# Patient Record
Sex: Male | Born: 1940 | Race: Black or African American | Hispanic: No | Marital: Married | Smoking: Former smoker
Health system: Southern US, Community
[De-identification: ages and names within clinical notes are randomized; demographics above are authoritative.]

## PROBLEM LIST (undated history)

## (undated) DIAGNOSIS — I10 Essential (primary) hypertension: Secondary | ICD-10-CM

## (undated) DIAGNOSIS — K5909 Other constipation: Secondary | ICD-10-CM

## (undated) DIAGNOSIS — E039 Hypothyroidism, unspecified: Secondary | ICD-10-CM

## (undated) DIAGNOSIS — IMO0001 Reserved for inherently not codable concepts without codable children: Secondary | ICD-10-CM

## (undated) DIAGNOSIS — R05 Cough: Secondary | ICD-10-CM

## (undated) DIAGNOSIS — Z5112 Encounter for antineoplastic immunotherapy: Secondary | ICD-10-CM

## (undated) DIAGNOSIS — N400 Enlarged prostate without lower urinary tract symptoms: Secondary | ICD-10-CM

## (undated) DIAGNOSIS — F411 Generalized anxiety disorder: Secondary | ICD-10-CM

## (undated) DIAGNOSIS — G4730 Sleep apnea, unspecified: Secondary | ICD-10-CM

## (undated) DIAGNOSIS — N529 Male erectile dysfunction, unspecified: Secondary | ICD-10-CM

## (undated) DIAGNOSIS — T451X5A Adverse effect of antineoplastic and immunosuppressive drugs, initial encounter: Secondary | ICD-10-CM

## (undated) DIAGNOSIS — K5730 Diverticulosis of large intestine without perforation or abscess without bleeding: Secondary | ICD-10-CM

## (undated) DIAGNOSIS — M25552 Pain in left hip: Secondary | ICD-10-CM

## (undated) DIAGNOSIS — R300 Dysuria: Secondary | ICD-10-CM

## (undated) DIAGNOSIS — J910 Malignant pleural effusion: Secondary | ICD-10-CM

## (undated) DIAGNOSIS — R0602 Shortness of breath: Secondary | ICD-10-CM

## (undated) DIAGNOSIS — H409 Unspecified glaucoma: Secondary | ICD-10-CM

## (undated) DIAGNOSIS — B351 Tinea unguium: Secondary | ICD-10-CM

## (undated) DIAGNOSIS — R5382 Chronic fatigue, unspecified: Secondary | ICD-10-CM

## (undated) DIAGNOSIS — IMO0002 Reserved for concepts with insufficient information to code with codable children: Secondary | ICD-10-CM

## (undated) DIAGNOSIS — G2581 Restless legs syndrome: Secondary | ICD-10-CM

## (undated) DIAGNOSIS — K589 Irritable bowel syndrome without diarrhea: Secondary | ICD-10-CM

## (undated) DIAGNOSIS — D6481 Anemia due to antineoplastic chemotherapy: Secondary | ICD-10-CM

## (undated) DIAGNOSIS — K219 Gastro-esophageal reflux disease without esophagitis: Secondary | ICD-10-CM

## (undated) DIAGNOSIS — E860 Dehydration: Secondary | ICD-10-CM

## (undated) DIAGNOSIS — Z5111 Encounter for antineoplastic chemotherapy: Secondary | ICD-10-CM

## (undated) DIAGNOSIS — E7800 Pure hypercholesterolemia, unspecified: Secondary | ICD-10-CM

## (undated) DIAGNOSIS — D126 Benign neoplasm of colon, unspecified: Secondary | ICD-10-CM

## (undated) DIAGNOSIS — C801 Malignant (primary) neoplasm, unspecified: Secondary | ICD-10-CM

## (undated) HISTORY — PX: COLONOSCOPY: SHX174

## (undated) HISTORY — DX: Gastro-esophageal reflux disease without esophagitis: K21.9

## (undated) HISTORY — PX: TONSILLECTOMY: SUR1361

## (undated) HISTORY — DX: Chronic fatigue, unspecified: R53.82

## (undated) HISTORY — DX: Diverticulosis of large intestine without perforation or abscess without bleeding: K57.30

## (undated) HISTORY — DX: Essential (primary) hypertension: I10

## (undated) HISTORY — DX: Restless legs syndrome: G25.81

## (undated) HISTORY — DX: Shortness of breath: R06.02

## (undated) HISTORY — PX: OTHER SURGICAL HISTORY: SHX169

## (undated) HISTORY — DX: Benign neoplasm of colon, unspecified: D12.6

## (undated) HISTORY — PX: CARDIAC CATHETERIZATION: SHX172

## (undated) HISTORY — DX: Reserved for inherently not codable concepts without codable children: IMO0001

## (undated) HISTORY — DX: Cough: R05

## (undated) HISTORY — DX: Other constipation: K59.09

## (undated) HISTORY — DX: Reserved for concepts with insufficient information to code with codable children: IMO0002

## (undated) HISTORY — DX: Adverse effect of antineoplastic and immunosuppressive drugs, initial encounter: T45.1X5A

## (undated) HISTORY — DX: Pure hypercholesterolemia, unspecified: E78.00

## (undated) HISTORY — DX: Irritable bowel syndrome, unspecified: K58.9

## (undated) HISTORY — DX: Generalized anxiety disorder: F41.1

## (undated) HISTORY — DX: Dysuria: R30.0

## (undated) HISTORY — PX: THYROIDECTOMY: SHX17

## (undated) HISTORY — DX: Tinea unguium: B35.1

## (undated) HISTORY — DX: Encounter for antineoplastic chemotherapy: Z51.11

## (undated) HISTORY — DX: Anemia due to antineoplastic chemotherapy: D64.81

## (undated) HISTORY — DX: Benign prostatic hyperplasia without lower urinary tract symptoms: N40.0

## (undated) HISTORY — DX: Male erectile dysfunction, unspecified: N52.9

## (undated) HISTORY — DX: Encounter for antineoplastic immunotherapy: Z51.12

## (undated) HISTORY — DX: Sleep apnea, unspecified: G47.30

## (undated) HISTORY — DX: Dehydration: E86.0

## (undated) HISTORY — DX: Pain in left hip: M25.552

---

## 1999-07-31 ENCOUNTER — Ambulatory Visit (HOSPITAL_COMMUNITY): Admission: RE | Admit: 1999-07-31 | Discharge: 1999-07-31 | Payer: Self-pay | Admitting: Gastroenterology

## 1999-07-31 ENCOUNTER — Encounter: Payer: Self-pay | Admitting: Gastroenterology

## 2000-10-31 ENCOUNTER — Encounter (INDEPENDENT_AMBULATORY_CARE_PROVIDER_SITE_OTHER): Payer: Self-pay | Admitting: Gastroenterology

## 2005-04-09 ENCOUNTER — Ambulatory Visit: Payer: Self-pay | Admitting: Pulmonary Disease

## 2005-05-17 ENCOUNTER — Ambulatory Visit: Payer: Self-pay | Admitting: Pulmonary Disease

## 2005-10-19 ENCOUNTER — Ambulatory Visit: Payer: Self-pay | Admitting: Gastroenterology

## 2005-10-30 ENCOUNTER — Ambulatory Visit: Payer: Self-pay | Admitting: Gastroenterology

## 2005-10-30 ENCOUNTER — Encounter (INDEPENDENT_AMBULATORY_CARE_PROVIDER_SITE_OTHER): Payer: Self-pay | Admitting: Specialist

## 2005-10-30 DIAGNOSIS — D126 Benign neoplasm of colon, unspecified: Secondary | ICD-10-CM | POA: Insufficient documentation

## 2006-06-20 ENCOUNTER — Ambulatory Visit: Payer: Self-pay | Admitting: Pulmonary Disease

## 2006-07-08 ENCOUNTER — Ambulatory Visit: Payer: Self-pay | Admitting: Pulmonary Disease

## 2006-07-17 ENCOUNTER — Ambulatory Visit (HOSPITAL_BASED_OUTPATIENT_CLINIC_OR_DEPARTMENT_OTHER): Admission: RE | Admit: 2006-07-17 | Discharge: 2006-07-17 | Payer: Self-pay | Admitting: Pulmonary Disease

## 2006-09-18 ENCOUNTER — Ambulatory Visit: Payer: Self-pay | Admitting: Pulmonary Disease

## 2006-09-18 LAB — CONVERTED CEMR LAB
ALT: 18 units/L (ref 0–40)
AST: 14 units/L (ref 0–37)
Albumin: 3.7 g/dL (ref 3.5–5.2)
Alkaline Phosphatase: 41 units/L (ref 39–117)
BUN: 14 mg/dL (ref 6–23)
Basophils Absolute: 0 10*3/uL (ref 0.0–0.1)
Basophils Relative: 0.5 % (ref 0.0–1.0)
Bilirubin, Direct: 0.1 mg/dL (ref 0.0–0.3)
CO2: 32 meq/L (ref 19–32)
Calcium: 9.3 mg/dL (ref 8.4–10.5)
Chloride: 108 meq/L (ref 96–112)
Cholesterol: 157 mg/dL (ref 0–200)
Creatinine, Ser: 1.1 mg/dL (ref 0.4–1.5)
Eosinophils Absolute: 0 10*3/uL (ref 0.0–0.6)
Eosinophils Relative: 1.5 % (ref 0.0–5.0)
GFR calc Af Amer: 86 mL/min
GFR calc non Af Amer: 71 mL/min
Glucose, Bld: 102 mg/dL — ABNORMAL HIGH (ref 70–99)
HCT: 43.2 % (ref 39.0–52.0)
HDL: 52.9 mg/dL (ref >39.0)
Hemoglobin: 14.7 g/dL (ref 13.0–17.0)
LDL Cholesterol: 94 mg/dL (ref 0–99)
Lymphocytes Relative: 42.2 % (ref 12.0–46.0)
MCHC: 34.2 g/dL (ref 30.0–36.0)
MCV: 86.6 fL (ref 78.0–100.0)
Monocytes Absolute: 0.3 10*3/uL (ref 0.2–0.7)
Monocytes Relative: 7.9 % (ref 3.0–11.0)
Neutro Abs: 1.6 10*3/uL (ref 1.4–7.7)
Neutrophils Relative %: 47.9 % (ref 43.0–77.0)
PSA: 1.33 ng/mL (ref 0.10–4.00)
Platelets: 280 10*3/uL (ref 150–400)
Potassium: 4.3 meq/L (ref 3.5–5.1)
RBC: 4.98 M/uL (ref 4.22–5.81)
RDW: 14.2 % (ref 11.5–14.6)
Sodium: 143 meq/L (ref 135–145)
TSH: 1.27 microintl units/mL (ref 0.35–5.50)
Total Bilirubin: 0.6 mg/dL (ref 0.3–1.2)
Total CHOL/HDL Ratio: 3
Total Protein: 6.6 g/dL (ref 6.0–8.3)
Triglycerides: 53 mg/dL (ref 0–149)
VLDL: 11 mg/dL (ref 0–40)
WBC: 3.3 10*3/uL — ABNORMAL LOW (ref 4.5–10.5)

## 2007-03-21 ENCOUNTER — Ambulatory Visit: Payer: Self-pay | Admitting: Pulmonary Disease

## 2007-04-11 ENCOUNTER — Ambulatory Visit: Payer: Self-pay | Admitting: Gastroenterology

## 2007-09-11 DIAGNOSIS — F528 Other sexual dysfunction not due to a substance or known physiological condition: Secondary | ICD-10-CM

## 2007-09-11 DIAGNOSIS — E7800 Pure hypercholesterolemia, unspecified: Secondary | ICD-10-CM

## 2007-09-11 DIAGNOSIS — I10 Essential (primary) hypertension: Secondary | ICD-10-CM

## 2007-09-11 DIAGNOSIS — K219 Gastro-esophageal reflux disease without esophagitis: Secondary | ICD-10-CM

## 2007-09-11 DIAGNOSIS — K589 Irritable bowel syndrome without diarrhea: Secondary | ICD-10-CM

## 2007-09-11 DIAGNOSIS — N138 Other obstructive and reflux uropathy: Secondary | ICD-10-CM | POA: Insufficient documentation

## 2007-09-11 DIAGNOSIS — N401 Enlarged prostate with lower urinary tract symptoms: Secondary | ICD-10-CM

## 2007-09-18 ENCOUNTER — Ambulatory Visit: Payer: Self-pay | Admitting: Pulmonary Disease

## 2007-09-21 DIAGNOSIS — G2581 Restless legs syndrome: Secondary | ICD-10-CM

## 2007-09-21 DIAGNOSIS — G4733 Obstructive sleep apnea (adult) (pediatric): Secondary | ICD-10-CM

## 2007-09-21 DIAGNOSIS — K5730 Diverticulosis of large intestine without perforation or abscess without bleeding: Secondary | ICD-10-CM

## 2007-09-21 LAB — CONVERTED CEMR LAB
ALT: 21 units/L (ref 0–53)
AST: 20 units/L (ref 0–37)
Albumin: 3.8 g/dL (ref 3.5–5.2)
Alkaline Phosphatase: 32 units/L — ABNORMAL LOW (ref 39–117)
BUN: 11 mg/dL (ref 6–23)
Basophils Absolute: 0 10*3/uL (ref 0.0–0.1)
Basophils Relative: 0.9 % (ref 0.0–1.0)
Bilirubin, Direct: 0.1 mg/dL (ref 0.0–0.3)
CO2: 29 meq/L (ref 19–32)
Calcium: 9.3 mg/dL (ref 8.4–10.5)
Chloride: 109 meq/L (ref 96–112)
Cholesterol: 124 mg/dL (ref 0–200)
Creatinine, Ser: 1.2 mg/dL (ref 0.4–1.5)
Eosinophils Absolute: 0.1 10*3/uL (ref 0.0–0.6)
Eosinophils Relative: 2.1 % (ref 0.0–5.0)
GFR calc Af Amer: 78 mL/min
GFR calc non Af Amer: 64 mL/min
Glucose, Bld: 114 mg/dL — ABNORMAL HIGH (ref 70–99)
HCT: 43.8 % (ref 39.0–52.0)
HDL: 43.8 mg/dL (ref >39.0)
Hemoglobin: 14 g/dL (ref 13.0–17.0)
LDL Cholesterol: 74 mg/dL (ref 0–99)
Lymphocytes Relative: 40.9 % (ref 12.0–46.0)
MCHC: 31.9 g/dL (ref 30.0–36.0)
MCV: 88.9 fL (ref 78.0–100.0)
Monocytes Absolute: 0.3 10*3/uL (ref 0.2–0.7)
Monocytes Relative: 9.6 % (ref 3.0–11.0)
Neutro Abs: 1.4 10*3/uL (ref 1.4–7.7)
Neutrophils Relative %: 46.5 % (ref 43.0–77.0)
PSA: 0.78 ng/mL (ref 0.10–4.00)
Platelets: 257 10*3/uL (ref 150–400)
Potassium: 4.3 meq/L (ref 3.5–5.1)
RBC: 4.92 M/uL (ref 4.22–5.81)
RDW: 13.5 % (ref 11.5–14.6)
Sodium: 142 meq/L (ref 135–145)
TSH: 1.16 microintl units/mL (ref 0.35–5.50)
Total Bilirubin: 0.6 mg/dL (ref 0.3–1.2)
Total CHOL/HDL Ratio: 2.8
Total Protein: 6.5 g/dL (ref 6.0–8.3)
Triglycerides: 33 mg/dL (ref 0–149)
VLDL: 7 mg/dL (ref 0–40)
WBC: 3 10*3/uL — ABNORMAL LOW (ref 4.5–10.5)

## 2008-04-26 ENCOUNTER — Telehealth (INDEPENDENT_AMBULATORY_CARE_PROVIDER_SITE_OTHER): Payer: Self-pay | Admitting: *Deleted

## 2008-06-29 ENCOUNTER — Encounter: Payer: Self-pay | Admitting: Pulmonary Disease

## 2008-10-15 ENCOUNTER — Ambulatory Visit: Payer: Self-pay | Admitting: Pulmonary Disease

## 2008-10-15 DIAGNOSIS — R413 Other amnesia: Secondary | ICD-10-CM

## 2008-10-15 DIAGNOSIS — B351 Tinea unguium: Secondary | ICD-10-CM | POA: Insufficient documentation

## 2008-10-15 DIAGNOSIS — F411 Generalized anxiety disorder: Secondary | ICD-10-CM | POA: Insufficient documentation

## 2008-10-19 ENCOUNTER — Ambulatory Visit: Payer: Self-pay | Admitting: Pulmonary Disease

## 2008-10-24 LAB — CONVERTED CEMR LAB
ALT: 23 units/L (ref 0–53)
AST: 24 units/L (ref 0–37)
Albumin: 3.7 g/dL (ref 3.5–5.2)
Alkaline Phosphatase: 42 units/L (ref 39–117)
BUN: 19 mg/dL (ref 6–23)
Basophils Absolute: 0 10*3/uL (ref 0.0–0.1)
Basophils Relative: 0.4 % (ref 0.0–3.0)
Bilirubin, Direct: 0.1 mg/dL (ref 0.0–0.3)
CO2: 27 meq/L (ref 19–32)
Calcium: 8.9 mg/dL (ref 8.4–10.5)
Chloride: 112 meq/L (ref 96–112)
Cholesterol: 123 mg/dL (ref 0–200)
Creatinine, Ser: 1.1 mg/dL (ref 0.4–1.5)
Eosinophils Absolute: 0 10*3/uL (ref 0.0–0.7)
Eosinophils Relative: 0.9 % (ref 0.0–5.0)
GFR calc non Af Amer: 85.56 mL/min (ref >60)
Glucose, Bld: 105 mg/dL — ABNORMAL HIGH (ref 70–99)
HCT: 41 % (ref 39.0–52.0)
HDL: 40.6 mg/dL (ref >39.00)
Hemoglobin: 13.8 g/dL (ref 13.0–17.0)
LDL Cholesterol: 75 mg/dL (ref 0–99)
Lymphocytes Relative: 42.5 % (ref 12.0–46.0)
Lymphs Abs: 1.6 10*3/uL (ref 0.7–4.0)
MCHC: 33.5 g/dL (ref 30.0–36.0)
MCV: 88.1 fL (ref 78.0–100.0)
Monocytes Absolute: 0.3 10*3/uL (ref 0.1–1.0)
Monocytes Relative: 8.4 % (ref 3.0–12.0)
Neutro Abs: 1.9 10*3/uL (ref 1.4–7.7)
Neutrophils Relative %: 47.8 % (ref 43.0–77.0)
Platelets: 233 10*3/uL (ref 150.0–400.0)
Potassium: 3.8 meq/L (ref 3.5–5.1)
RBC: 4.66 M/uL (ref 4.22–5.81)
RDW: 13.7 % (ref 11.5–14.6)
Sodium: 142 meq/L (ref 135–145)
TSH: 0.91 microintl units/mL (ref 0.35–5.50)
Total Bilirubin: 0.7 mg/dL (ref 0.3–1.2)
Total CHOL/HDL Ratio: 3
Total Protein: 6.4 g/dL (ref 6.0–8.3)
Triglycerides: 36 mg/dL (ref 0.0–149.0)
VLDL: 7.2 mg/dL (ref 0.0–40.0)
WBC: 3.8 10*3/uL — ABNORMAL LOW (ref 4.5–10.5)

## 2008-11-05 ENCOUNTER — Encounter (INDEPENDENT_AMBULATORY_CARE_PROVIDER_SITE_OTHER): Payer: Self-pay | Admitting: *Deleted

## 2008-11-10 ENCOUNTER — Telehealth (INDEPENDENT_AMBULATORY_CARE_PROVIDER_SITE_OTHER): Payer: Self-pay | Admitting: *Deleted

## 2008-11-16 ENCOUNTER — Telehealth: Payer: Self-pay | Admitting: Gastroenterology

## 2009-07-12 ENCOUNTER — Ambulatory Visit: Payer: Self-pay | Admitting: Gastroenterology

## 2009-07-12 DIAGNOSIS — K5909 Other constipation: Secondary | ICD-10-CM

## 2009-07-13 ENCOUNTER — Ambulatory Visit: Payer: Self-pay | Admitting: Gastroenterology

## 2009-07-18 ENCOUNTER — Encounter: Payer: Self-pay | Admitting: Gastroenterology

## 2009-07-21 ENCOUNTER — Ambulatory Visit: Payer: Self-pay | Admitting: Gastroenterology

## 2009-07-26 ENCOUNTER — Encounter: Payer: Self-pay | Admitting: Gastroenterology

## 2009-08-17 ENCOUNTER — Ambulatory Visit: Payer: Self-pay | Admitting: Gastroenterology

## 2009-08-22 ENCOUNTER — Telehealth: Payer: Self-pay | Admitting: Gastroenterology

## 2009-08-24 ENCOUNTER — Telehealth: Payer: Self-pay | Admitting: Gastroenterology

## 2009-12-05 ENCOUNTER — Encounter: Payer: Self-pay | Admitting: Pulmonary Disease

## 2010-01-09 ENCOUNTER — Ambulatory Visit: Payer: Self-pay | Admitting: Pulmonary Disease

## 2010-01-09 ENCOUNTER — Encounter: Payer: Self-pay | Admitting: Adult Health

## 2010-01-30 ENCOUNTER — Encounter: Payer: Self-pay | Admitting: Adult Health

## 2010-01-30 ENCOUNTER — Ambulatory Visit: Payer: Self-pay | Admitting: Pulmonary Disease

## 2010-02-06 ENCOUNTER — Telehealth: Payer: Self-pay | Admitting: Adult Health

## 2010-02-06 ENCOUNTER — Telehealth (INDEPENDENT_AMBULATORY_CARE_PROVIDER_SITE_OTHER): Payer: Self-pay | Admitting: *Deleted

## 2010-02-06 LAB — CONVERTED CEMR LAB
ALT: 26 units/L (ref 0–53)
AST: 20 units/L (ref 0–37)
Albumin: 4.1 g/dL (ref 3.5–5.2)
Alkaline Phosphatase: 39 units/L (ref 39–117)
BUN: 20 mg/dL (ref 6–23)
Basophils Absolute: 0 10*3/uL (ref 0.0–0.1)
Basophils Relative: 0.4 % (ref 0.0–3.0)
Bilirubin, Direct: 0.1 mg/dL (ref 0.0–0.3)
CO2: 29 meq/L (ref 19–32)
Calcium: 9.7 mg/dL (ref 8.4–10.5)
Chloride: 111 meq/L (ref 96–112)
Cholesterol: 159 mg/dL (ref 0–200)
Creatinine, Ser: 1.2 mg/dL (ref 0.4–1.5)
Eosinophils Absolute: 0 10*3/uL (ref 0.0–0.7)
Eosinophils Relative: 1.3 % (ref 0.0–5.0)
Folate: 18 ng/mL
GFR calc non Af Amer: 76.37 mL/min (ref >60)
Glucose, Bld: 110 mg/dL — ABNORMAL HIGH (ref 70–99)
HCT: 41.5 % (ref 39.0–52.0)
HDL: 51.4 mg/dL (ref >39.00)
Hemoglobin: 14.1 g/dL (ref 13.0–17.0)
LDL Cholesterol: 98 mg/dL (ref 0–99)
Lymphocytes Relative: 39.4 % (ref 12.0–46.0)
Lymphs Abs: 1.5 10*3/uL (ref 0.7–4.0)
MCHC: 33.9 g/dL (ref 30.0–36.0)
MCV: 88.8 fL (ref 78.0–100.0)
Monocytes Absolute: 0.3 10*3/uL (ref 0.1–1.0)
Monocytes Relative: 9.3 % (ref 3.0–12.0)
Neutro Abs: 1.9 10*3/uL (ref 1.4–7.7)
Neutrophils Relative %: 49.6 % (ref 43.0–77.0)
Platelets: 253 10*3/uL (ref 150.0–400.0)
Potassium: 4.5 meq/L (ref 3.5–5.1)
RBC: 4.68 M/uL (ref 4.22–5.81)
RDW: 15 % — ABNORMAL HIGH (ref 11.5–14.6)
RPR Ser Ql: REACTIVE — AB
RPR Titer: 1:2 {titer}
Sodium: 143 meq/L (ref 135–145)
T pallidum Antibodies (TP-PA): >8 — ABNORMAL HIGH (ref <0.90)
TSH: 0.65 microintl units/mL (ref 0.35–5.50)
Total Bilirubin: 0.5 mg/dL (ref 0.3–1.2)
Total CHOL/HDL Ratio: 3
Total Protein: 7 g/dL (ref 6.0–8.3)
Triglycerides: 46 mg/dL (ref 0.0–149.0)
VLDL: 9.2 mg/dL (ref 0.0–40.0)
Vit D, 25-Hydroxy: 33 ng/mL (ref 30–89)
Vitamin B-12: 701 pg/mL (ref 211–911)
WBC: 3.8 10*3/uL — ABNORMAL LOW (ref 4.5–10.5)

## 2010-02-07 ENCOUNTER — Encounter: Payer: Self-pay | Admitting: Adult Health

## 2010-02-07 LAB — CONVERTED CEMR LAB
RPR Ser Ql: REACTIVE — AB
RPR Titer: 1:1 {titer}
T pallidum Antibodies (TP-PA): >8 — ABNORMAL HIGH (ref <0.90)

## 2010-02-08 ENCOUNTER — Encounter: Payer: Self-pay | Admitting: Pulmonary Disease

## 2010-02-17 ENCOUNTER — Encounter: Payer: Self-pay | Admitting: Pulmonary Disease

## 2010-02-22 ENCOUNTER — Encounter: Payer: Self-pay | Admitting: Pulmonary Disease

## 2010-02-23 ENCOUNTER — Encounter: Payer: Self-pay | Admitting: Adult Health

## 2010-02-23 ENCOUNTER — Telehealth (INDEPENDENT_AMBULATORY_CARE_PROVIDER_SITE_OTHER): Payer: Self-pay | Admitting: *Deleted

## 2010-02-27 ENCOUNTER — Encounter: Payer: Self-pay | Admitting: Pulmonary Disease

## 2010-03-01 ENCOUNTER — Telehealth: Payer: Self-pay | Admitting: Adult Health

## 2010-03-10 ENCOUNTER — Telehealth (INDEPENDENT_AMBULATORY_CARE_PROVIDER_SITE_OTHER): Payer: Self-pay | Admitting: *Deleted

## 2010-03-13 ENCOUNTER — Ambulatory Visit: Payer: Self-pay | Admitting: Pulmonary Disease

## 2010-03-20 ENCOUNTER — Ambulatory Visit: Payer: Self-pay | Admitting: Pulmonary Disease

## 2010-03-27 ENCOUNTER — Ambulatory Visit: Payer: Self-pay | Admitting: Pulmonary Disease

## 2010-05-22 ENCOUNTER — Ambulatory Visit: Payer: Self-pay | Admitting: Pulmonary Disease

## 2010-05-22 ENCOUNTER — Encounter: Payer: Self-pay | Admitting: Adult Health

## 2010-05-22 ENCOUNTER — Telehealth (INDEPENDENT_AMBULATORY_CARE_PROVIDER_SITE_OTHER): Payer: Self-pay | Admitting: *Deleted

## 2010-05-29 DIAGNOSIS — R071 Chest pain on breathing: Secondary | ICD-10-CM

## 2010-07-12 ENCOUNTER — Ambulatory Visit: Admit: 2010-07-12 | Payer: Self-pay | Admitting: Pulmonary Disease

## 2010-07-13 ENCOUNTER — Telehealth: Payer: Self-pay | Admitting: Pulmonary Disease

## 2010-07-23 ENCOUNTER — Encounter: Payer: Self-pay | Admitting: Urology

## 2010-08-01 NOTE — Letter (Signed)
Summary: EGD Instructions  [facility]  [address] [place], [place] [address]   Phone: [phone]  Fax: [phone]       Alan [name]    [birth year]    MRN: [id number]       Procedure Day /Date:THURSDAY 07/21/2009     Arrival Time: 8:00AM     Procedure Time:9:00AM     Location of Procedure:                    X  [facility] (4th Floor)   PREPARATION FOR ENDOSCOPY/DIL   On 07/21/2009 THE DAY OF THE PROCEDURE:  1.   No solid foods, milk or milk products are allowed after midnight the night before your procedure.  2.   Do not drink anything colored red or purple.  Avoid juices with pulp.  No orange juice.  3.  You may drink clear liquids until 7:00AM  which is 2 hours before your procedure.                                                                                                CLEAR LIQUIDS INCLUDE: Water Jello Ice Popsicles Tea (sugar ok, no milk/cream) Powdered fruit flavored drinks Coffee (sugar ok, no milk/cream) Gatorade Juice: apple, white grape, white cranberry  Lemonade Clear bullion, consomm, broth Carbonated beverages (any kind) Strained chicken noodle soup Hard Candy   MEDICATION INSTRUCTIONS  Unless otherwise instructed, you should take regular prescription medications with a small sip of water as early as possible the morning of your procedure.            OTHER INSTRUCTIONS  You will need a responsible adult at least 70 years of age to accompany you and drive you home.   This person must remain in the waiting room during your procedure.  Wear loose fitting clothing that is easily removed.  Leave jewelry and other valuables at home.  However, you may wish to bring a book to read or an iPod/MP3 player to listen to music as you wait for your procedure to start.  Remove all body piercing jewelry and leave at home.  Total time from sign-in until discharge is approximately 2-3 hours.  You should go home  directly after your procedure and rest.  You can resume normal activities the day after your procedure.  The day of your procedure you should not:   Drive   Make legal decisions   Operate machinery   Drink alcohol   Return to work  You will receive specific instructions about eating, activities and medications before you leave.    The above instructions have been reviewed and explained to me by   _______________________    I fully understand and can verbalize these instructions _____________________________ Date _________

## 2010-08-01 NOTE — Assessment & Plan Note (Signed)
Summary: 20DAYS F-UP/YF    History of Present Illness Visit Type: Follow-up Visit Primary GI MD: Melvia Heaps MD [facility] Primary Provider: Alroy Dust, MD  Requesting Provider: n/a Chief Complaint: Pt states he is doing wonderful on the Prilosec, No GI complaints History of Present Illness:   [name] has returned following upper endoscopy and colonoscopy.  The former  demonstrated erosions at the GE junction and duodenitis.  At colonoscopy multiple adenomatous polyps were removed.  On Prilosec and MiraLax every 4 days he is feeling quite well.  Reflux symptoms are entirely gone.  He no longer has constipation or lower abdominal discomfort.   GI Review of Systems      Denies abdominal pain, acid reflux, belching, bloating, chest pain, dysphagia with liquids, dysphagia with solids, heartburn, loss of appetite, nausea, vomiting, vomiting blood, weight loss, and  weight gain.        Denies anal fissure, black tarry stools, change in bowel habit, constipation, diarrhea, diverticulosis, fecal incontinence, heme positive stool, hemorrhoids, irritable bowel syndrome, jaundice, light color stool, liver problems, rectal bleeding, and  rectal pain.    Current Medications (verified): 1)  Amlodipine Besylate 5 Mg  Tabs (Amlodipine Besylate) .... Take 1 Tablet By Mouth Once A Day 2)  Simvastatin 20 Mg  Tabs (Simvastatin) .... Take 1 Tablet By Mouth At Bedtime.Marland KitchenMarland Kitchen 3)  Flomax 0.4 Mg Xr24h-Cap (Tamsulosin Hcl) .... Take 1 Tab By Mouth At Bedtime.Marland KitchenMarland Kitchen 4)  Viagra 100 Mg Tabs (Sildenafil Citrate) .... Use As Directed 5)  Requip 1 Mg Tabs (Ropinirole Hcl) .... One Tablet By Mouth Once Daily 6)  Prilosec 20 Mg Cpdr (Omeprazole) .... Take One Tab Daily  Allergies (verified): 1)  ! Codeine  Past History:  Past Medical History: Last updated: 10/15/2008 OBSTRUCTIVE SLEEP APNEA (ICD-327.23) HYPERTENSION (ICD-401.9) HYPERCHOLESTEROLEMIA (ICD-272.0) GERD (ICD-530.81) DIVERTICULOSIS OF COLON  (ICD-562.10) IRRITABLE BOWEL SYNDROME (ICD-564.1) COLONIC POLYPS (ICD-211.3) BENIGN PROSTATIC HYPERTROPHY, HX OF (ICD-V13.8) ERECTILE DYSFUNCTION (ICD-302.72) RESTLESS LEG SYNDROME (ICD-333.94) ANXIETY (ICD-300.00) Hx of ONYCHOMYCOSIS (ICD-110.1)  Past Surgical History: Last updated: 07/12/2009 Unremarkable  Family History: Last updated: 07/12/2009 Family History of Colon Cancer: PGF  Social History: Last updated: 07/12/2009 Occupation: Retired Patient is a former smoker: quit 30 yrs ago  Alcohol Use - no Illicit Drug Use - no  Review of Systems  The patient denies allergy/sinus, anemia, anxiety-new, arthritis/joint pain, back pain, blood in urine, breast changes/lumps, change in vision, confusion, cough, coughing up blood, depression-new, fainting, fatigue, fever, headaches-new, hearing problems, heart murmur, heart rhythm changes, itching, muscle pains/cramps, night sweats, nosebleeds, shortness of breath, skin rash, sleeping problems, sore throat, swelling of feet/legs, swollen lymph glands, thirst - excessive, urination - excessive, urination changes/pain, urine leakage, vision changes, and voice change.    Vital Signs:  Patient profile:   70 year old male Height:      66.5 inches Weight:      170 pounds BMI:     27.13 BSA:     1.88 Pulse rate:   80 / minute Pulse rhythm:   regular BP sitting:   122 / 84  (left arm)  Vitals Entered By: Merri Ray CMA Duncan Dull) (August 17, 2009 9:57 AM)   Impression & Recommendations:  Problem # 1:  GERD (ICD-530.81) Assessment Improved Plan to continue Prilosec as needed  Problem # 2:  COLONIC POLYPS (ICD-211.3) followup colonoscopy in 3 years  Problem # 3:  OTHER CONSTIPATION (ICD-564.09) Assessment: Improved

## 2010-08-01 NOTE — Assessment & Plan Note (Signed)
Summary: inj of pcn--please call Shanda Bumps jones/pt here at 9/la   Nurse Visit   Allergies: 1)  ! 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

## 2010-08-01 NOTE — Progress Notes (Signed)
Summary: returning call  Phone Note Call from Patient Call back at Home Phone [phone]   Caller: Patient Call For: Alan [name] p Reason for Call: Talk to Nurse Summary of Call: pt returning call top [name] P Initial call taken by: Eugene Gavia,  February 06, 2010 8:14 AM  Follow-up for Phone Call        RPR is still reactive, he denies known hx of exposure or prev. tx.  Titer is low, suspect this is latent w/ no known tx in past rec:  benzathine penicillin G 2.4 million units intramuscularly once weekly for 3 weeks  we will send rx to pharm and get you to bring back pt is aware and will get med and bring to our office for shot.       Follow-up by: Timmi Devora NP,  February 06, 2010 9:50 AM    New/Updated Medications: BICILLIN L-A 2400000 UNIT/4ML SUSP (PENICILLIN G BENZATHINE) give IM shot weekly x 3 at our office Prescriptions: BICILLIN L-A 2400000 UNIT/4ML SUSP (PENICILLIN G BENZATHINE) give IM shot weekly x 3 at our office  #3 x 0   Entered and Authorized by:   Rubye Oaks NP   Signed by:   Rubye Oaks NP on 02/06/2010   Method used:   Electronically to        [facility] (retail)       [address]       [place], [place]  [address]       Ph: [phone]       Fax: [phone]   RxID:   [id number]   Appended Document: returning call injection visits scheduled - see other 8.8.11 phone message for details.

## 2010-08-01 NOTE — Assessment & Plan Note (Signed)
Summary: penicillin injection - no charge   Nurse Visit    Allergies: 1)  ! Codeine  Orders Added: 1)  No Charge Patient Arrived (NCPA0) [NCPA0]   patient was arrived for nurse visit to give PCN injections; it was discussed between patient and TP that the injections are not to be given here in the office d/t patient following up with neurology per append to 01-30-10 labs.  will document as "no charge." Boone Master CNA/MA  February 24, 2010 11:12 AM

## 2010-08-01 NOTE — Assessment & Plan Note (Signed)
Summary: pcn injection///JJ   Nurse Visit   Allergies: 1)  ! 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

## 2010-08-01 NOTE — Progress Notes (Signed)
Summary: requests recommendations  Phone Note Call from Patient   Caller: Patient Call For: nadel Summary of Call: pt states he has been waiting to hear from someone re: what the next step is (since receiving results from neurologist). he thought he should have heard from someone last week. pt # [id number] Initial call taken by: Tivis Ringer, CNA,  March 10, 2010 11:06 AM  Follow-up for Phone Call        Spoke with pt. He is upset and states "seems like I fell through the cracks with this situation"- he states that he was seen by neuro and that they did LP on 02/17/10 and faxed over results to SN same day.  He states that he was told that SN should followup on this and give further recs and advise him what the next step is.  Pls advise thanks Follow-up by: Vernie Murders,  March 10, 2010 11:32 AM  Additional Follow-up for Phone Call Additional follow up Details #1::        called and spoke with pt---per SN/Tp  he will need pcn injections once a week x 3 wks.  pt stated that he will be here on monday at 9 for this injection and he is aware that he will need to come in the next 2 mondays for the other 2 injections.  pt voiced his understanding of this.   Randell Loop CMA  March 10, 2010 3:30 PM

## 2010-08-01 NOTE — Assessment & Plan Note (Signed)
Summary: NP follow up - med refills   Copy to:  n/a Primary Provider/Referring Provider:  Alroy Dust, MD   CC:  need refills, pt is fasting, and no complaints.  History of Present Illness: 70 y/o BM with known hx of HTN, Hyperlipidemia and GERD.    4/10-- . he has mult med problems as noted below... he feels as though he has been doing well except that he worries about his memory- mostly forgetting names etc... we discussed "benign forgetfullness" vs early stage alzheimers and recommended mental exercises w/ scrabble, puzzles, etc...   January 09, 2010-Presents for follow up and med refills. Since last visit 1 year ago, doing well w/ no complaints today. Had colonoscopy and endo in 07/2009 w/ several polyps removed w/ neg path report, follow up colon in 3 years. Has BPH f/by Dr. Marcello Fennel ? need for surgery is planning for second opinion at [facility] this week. He does not execise on regular basis. Cholesterol last year w/ LDL <100 on simvastatin 20mg  . Last visit, in 09/2008 w/ some ? memory impaiment. w/ frequent forgetfullness. THis is continuing, today he continues to have difficulty w/ recall w/ 2/3 objects and missed 1 letter on spelling "DLROW". He has a strong family hx w/ both maternal grandmothers that had dementia. (passed at 75 and 7). Denies chest pain, dyspnea, orthopnea, hemoptysis, fever, n/v/d, edema, headache,recent travel or antibiotics.         Current Medications (verified): 1)  Amlodipine Besylate 5 Mg  Tabs (Amlodipine Besylate) .... Take 1 Tablet By Mouth Once A Day 2)  Simvastatin 20 Mg  Tabs (Simvastatin) .... Take 1 Tablet By Mouth At Bedtime.Marland KitchenMarland Kitchen 3)  Flomax 0.4 Mg Xr24h-Cap (Tamsulosin Hcl) .... Take 1 Tab By Mouth At Bedtime.Marland KitchenMarland Kitchen 4)  Viagra 100 Mg Tabs (Sildenafil Citrate) .... Use As Directed 5)  Requip 1 Mg Tabs (Ropinirole Hcl) .... One Tablet By Mouth Once Daily 6)  Prilosec 20 Mg Cpdr (Omeprazole) .... Take One Tab Daily 7)  Finasteride 5 Mg Tabs (Finasteride)  .[name].. 1 By Mouth Once Daily  Allergies: 1)  ! 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.1)  Past Surgical History: Last updated: 07/12/2009 Unremarkable  Review of Systems      See HPI  Vital Signs:  Patient profile:   70 year old male Height:      66.5 inches Weight:      179 pounds BMI:     28.56 O2 Sat:      98 % on Room air Pulse rate:   83 / minute BP sitting:   130 / 78  (right arm) Cuff size:   regular  Vitals Entered By: Kandice Hams CMA (January 09, 2010 10:00 AM)  O2 Flow:  Room air CC: need refills, pt is fasting, no complaints   Physical Exam  Additional Exam:   skin: anicteric HEENT: normocephalic; PEERLA; no nasal or pharyngeal abnormalities neck: supple nodes: no cervical lymphadenopathy chest: clear to ausculatation and percussion heart: no murmurs, gallops, or rubs abd: soft, nontender; BS normoactive; no abdominal masses, tenderness, organomegaly rectal: deferred ext: no cynanosis, clubbing, edema skeletal: no deformities neuro: oriented x 3; no focal abnormalities,  quick mental exam--recall 2/3 objects, "DLOW",-missed one.  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 w/ strong family hx  will check labs w/ tsh, b12/folate, and RPR will refer to neuro for eval.   Orders: TLB-B12 + Folate Pnl [phone]) T-RPR (Syphilis) [phone]) T-Vitamin D (25-Hydroxy) (32440-10272) Neurology Referral (Neuro) Est. Patient Level IV (53664)  Problem # 2:   HYPERTENSION (ICD-401.9) controlled on rx  His updated medication list for this problem includes:    Amlodipine Besylate 5 Mg Tabs (Amlodipine besylate) .[name]... Take 1 tablet by mouth once a day  Orders: TLB-BMP (Basic Metabolic Panel-BMET) (80048-METABOL) TLB-CBC Platelet - w/Differential (85025-CBCD) T-Vitamin D (25-Hydroxy) (40347-42595) Est. Patient Level IV (63875)  BP today: 130/78 Prior BP: 122/84 (08/17/2009)  Labs Reviewed: K+: 3.8 (10/19/2008) Creat: : 1.1 (10/19/2008)   Chol: 123 (10/19/2008)   HDL: 40.60 (10/19/2008)   LDL: 75 (10/19/2008)   TG: 36.0 (10/19/2008)  Problem # 3:  GERD (ICD-530.81) controlled.  His updated medication list for this problem includes:    Prilosec 20 Mg Cpdr (Omeprazole) .[name]... Take one tab daily  Orders: TLB-CBC Platelet - w/Differential (85025-CBCD) Est. Patient Level IV (64332)  Problem # 4:  HYPERCHOLESTEROLEMIA (ICD-272.0) at goal.  His updated medication list for this problem includes:    Simvastatin 20 Mg Tabs (Simvastatin) .[name]... Take 1 tablet by mouth at bedtime...  Orders: TLB-Hepatic/Liver Function Pnl (80076-HEPATIC) TLB-TSH (Thyroid Stimulating Hormone) (84443-TSH) TLB-Lipid Panel (80061-LIPID) Est. Patient Level IV (95188)  Labs Reviewed: SGOT: 24 (10/19/2008)   SGPT: 23 (10/19/2008)   HDL:40.60 (10/19/2008), 43.8 (09/18/2007)  LDL:75 (10/19/2008), 74 (09/18/2007)  Chol:123 (10/19/2008), 124 (09/18/2007)  Trig:36.0 (10/19/2008), 33 (09/18/2007)  Medications Added to Medication List This Visit: 1)  Finasteride 5 Mg Tabs (Finasteride) .[name].. 1 by mouth once daily  Complete Medication List: 1)  Amlodipine Besylate 5 Mg Tabs (Amlodipine besylate) .... Take 1 tablet by mouth once a day 2)  Simvastatin 20 Mg Tabs (Simvastatin) .... Take 1 tablet by mouth at bedtime.Marland KitchenMarland Kitchen 3)  Prilosec 20 Mg Cpdr (Omeprazole) .... Take one tab daily 4)  Finasteride 5 Mg Tabs (Finasteride) .[name].. 1 by mouth once daily 5)  Viagra 100 Mg Tabs  (Sildenafil citrate) .... Use as directed  Other Orders: Pneumococcal Vaccine (41660) Admin 1st Vaccine (63016) Admin 1st Vaccine [facility]) [phone])  Patient Instructions: 1)  Pneumovax today.  2)  Continue reading the paper, keeping list, puzzles are helpful.  3)  Low fat cholestrol diet, add light exercise such as walking , advance as tolerated.  4)  We are referring you  to neuro to evaluate your memory.  5)  I will call with labs.  6)  follow up Dr. Kriste Basque in 6 months for physical.  7)  Please contact office for sooner follow up if symptoms do not improve or worsen  Prescriptions: VIAGRA 100 MG TABS (SILDENAFIL CITRATE) use as directed  #10 x 6   Entered by:   Boone Master CNA/MA   Authorized by:   Rubye Oaks NP   Signed by:   Boone Master CNA/MA on 01/09/2010   Method used:   Electronically to        [facility] (retail)       [address]       [place], [place]  [address]       Ph: [phone]       Fax: [phone]   RxID:   [id number] PRILOSEC 20 MG  CPDR (OMEPRAZOLE) take one tab daily  #30 Each x 6   Entered by:   Boone Master CNA/MA   Authorized by:   Rubye Oaks NP   Signed by:   Boone Master CNA/MA on 01/09/2010   Method used:   Electronically to        [facility] (retail)       [address]       [place], [place]  [address]       Ph: [phone]       Fax: [phone]   RxID:   [id number] SIMVASTATIN 20 MG  TABS (SIMVASTATIN) Take 1 tablet by mouth at bedtime...  #30 x 6   Entered by:   Boone Master CNA/MA   Authorized by:   Rubye Oaks NP   Signed by:   Boone Master CNA/MA on 01/09/2010   Method used:   Electronically to        [facility] (retail)       [address]       [place], [place]  [address]       Ph: [phone]       Fax: [phone]   RxID:   [id number] AMLODIPINE BESYLATE 5 MG  TABS (AMLODIPINE BESYLATE) Take 1 tablet  by mouth once a day  #90 Each x 6   Entered by:   Boone Master CNA/MA   Authorized by:   Rubye Oaks NP   Signed by:   Boone Master CNA/MA on 01/09/2010   Method used:   Electronically to        [facility] (retail)       [address]       [place], [place]  [address]       Ph: [phone]       Fax: [phone]   RxID:   [id number]    Pneumovax Vaccine    Vaccine Type: Pneumovax    Site: right deltoid    Mfr: [employer]    Dose: 0.5 ml    Route: IM    Given by: Boone Master CNA/MA    Exp. Date: 04/26/2011    Lot #: [id number]    VIS given: 01/28/96 version given January 09, 2010.     Appended Document: NP follow up - med refills faxed via biscom to Dr. Terrace Arabia for upcoming 8.19.11 ov per TP's request.

## 2010-08-01 NOTE — Letter (Signed)
Summary: [facility]   Imported By: Lester Stephen 12/28/2009 08:19:05  _____________________________________________________________________  External Attachment:    Type:   Image     Comment:   External Document

## 2010-08-01 NOTE — Progress Notes (Signed)
Summary: question about PCN injection  Phone Note Call from Patient Call back at Home Phone [phone]   Caller: Patient Call For: parrett Summary of Call: Returning tammy's call. Initial call taken by: Darletta Moll,  February 06, 2010 3:03 PM  Follow-up for Phone Call        Tammy, did you call this pt again? Pls advise thanks Vernie Murders  February 06, 2010 3:07 PM  called spoke with patient.  appt made on xolair schedule for 8.10.11, 8.17.11 and 8.24.11 for penicillin injections.  pt is aware of these times and will ask for me to give the injections.  also, called spoke with pharmacy who stated that the pcn will be provided in single-dose vials.  pt to bring 1 dose with him to each visit.  pt verbalized his understanding. Follow-up by: Boone Master CNA/MA,  February 06, 2010 4:33 PM     Appended Document: question about PCN injection faxed via biscom to Dr. Terrace Arabia for upcoming 8.19.11 ov per TP's request.

## 2010-08-01 NOTE — Progress Notes (Signed)
Summary: returning call - LMTCB x 2-PT RETURNED CALL  Phone Note Call from Patient   Caller: Patient Call For: tammy parrett Reason for Call: Talk to Nurse Summary of Call: Returning Jessica's call.  Please call back at [phone]. Initial call taken by: Lehman Prom,  February 23, 2010 8:39 AM  Follow-up for Phone Call        jess did you call this pt?  Aundra Millet Reynolds LPN  February 23, 2010 9:21 AM   no, i've not spoken to this patient recently.  i'm not sure why he is calling. Boone Master CNA/MA  February 23, 2010 9:50 AM   Additional Follow-up for Phone Call Additional follow up Details #1::        [phone] is a fax number. LMOMTCB on [phone]. Checked with Virgel Bouquet, and Katheren Shams neither are trying to contact pt. Pt is receiving PCN inj from [facility]. Pt was scheduled for inj on August 24, "phone tree" may have called to inform him of same. Zackery Barefoot CMA  February 23, 2010 10:13 AM     Additional Follow-up for Phone Call Additional follow up Details #2::    lmomtcb [address] [facility]  February 23, 2010 3:05 PM   ATC pt's home number - [facility] Gweneth Dimitri RN  February 24, 2010 4:56 PM  Pt returned call from triage. call home # per pt. Tivis Ringer, CNA  February 27, 2010 8:58 AM    Additional Follow-up for Phone Call Additional follow up Details #3:: Details for Additional Follow-up Action Taken: Spoke with pt and advised that no one from the office has needed to contact him or has contacted him.  Adivsed that this could have been the phone tree calling him about inj appt.  Pt verbalized understanding. Additional Follow-up by: Vernie Murders,  February 27, 2010 9:06 AM

## 2010-08-01 NOTE — Consult Note (Signed)
Summary: [facility]  [facility]   Imported By: Sherian Rein 03/01/2010 14:32:23  _____________________________________________________________________  External Attachment:    Type:   Image     Comment:   External Document

## 2010-08-01 NOTE — Assessment & Plan Note (Signed)
Summary: Acute NP office visit - cough, CP   Copy to:  n/a Primary Provider/Referring Provider:  Alroy Dust, MD   CC:  dry cough x2weeks, denies wheezing, SOB, mucus production - was levaquin at UC, finished yesterday.  sharp CP left side, occ radiation into the left shoulder/arm x5days - denies vision changes, and difficulty swallowing.  History of Present Illness: 70 y/o BM with known hx of HTN, Hyperlipidemia and GERD.    4/10-- . he has mult med problems as noted below... he feels as though he has been doing well except that he worries about his memory- mostly forgetting names etc... we discussed "benign forgetfullness" vs early stage alzheimers and recommended mental exercises w/ scrabble, puzzles, etc...   January 09, 2010-Presents for follow up and med refills. Since last visit 1 year ago, doing well w/ no complaints today. Had colonoscopy and endo in 07/2009 w/ several polyps removed w/ neg path report, follow up colon in 3 years. Has BPH f/by Dr. Marcello Fennel ? need for surgery is planning for second opinion at [facility] this week. He does not execise on regular basis. Cholesterol last year w/ LDL <100 on simvastatin 20mg  . Last visit, in 09/2008 w/ some ? memory impaiment. w/ frequent forgetfullness. THis is continuing, today he continues to have difficulty w/ recall w/ 2/3 objects and missed 1 letter on spelling "DLROW". He has a strong family hx w/ both maternal grandmothers that had dementia. (passed at 85 and 18). Denies chest pain, dyspnea, orthopnea, hemoptysis, fever, n/v/d, edema, headache,recent travel or antibiotics.     May 22, 2010--Presents for an acute office visit. Complains of dry cough x2weeks. Was seen at Urgent care, dx w/ bronchtis and tx w/  levaquin which he finished yesterday. Complains that he has occasional  sharp CP left side, occ radiation into the left shoulder/arm x5days - denies vision changes, difficulty swallowing, extremity weakness, neck pain, speech  changes. Cough and congestion are better but has some pain on inspiration. Says he had xray with no Pneumonia. No records are available. Feels like he pulled a muscle after lifting heavy equipment 2 weeks ago.  Denies chest pain,  orthopnea, hemoptysis, fever, n/v/d, edema, headache.  Medications Prior to Update: 1)  Amlodipine Besylate 5 Mg  Tabs (Amlodipine Besylate) .... Take 1 Tablet By Mouth Once A Day 2)  Simvastatin 20 Mg  Tabs (Simvastatin) .... Take 1 Tablet By Mouth At Bedtime.Marland KitchenMarland Kitchen 3)  Prilosec 20 Mg Cpdr (Omeprazole) .... Take One Tab Daily 4)  Finasteride 5 Mg Tabs (Finasteride) .[name].. 1 By Mouth Once Daily 5)  Viagra 100 Mg Tabs (Sildenafil Citrate) .... Use As Directed 6)  Bicillin L-A 2400000 Unit/74ml Susp (Penicillin G Benzathine) .... Give Im Shot Weekly X 3 At [facility]  Current Medications (verified): 1)  Amlodipine Besylate 5 Mg  Tabs (Amlodipine Besylate) .... Take 1 Tablet By Mouth Once A Day 2)  Simvastatin 20 Mg  Tabs (Simvastatin) .... Take 1 Tablet By Mouth At Bedtime.Marland KitchenMarland Kitchen 3)  Prilosec 20 Mg Cpdr (Omeprazole) .... Take One Tab Daily 4)  Finasteride 5 Mg Tabs (Finasteride) .[name].. 1 By Mouth Once Daily 5)  Viagra 100 Mg Tabs (Sildenafil Citrate) .... Use As Directed  Allergies (verified): 1)  ! 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)  Family History: Last updated: 07/12/2009 Family History of Colon Cancer: PGF  Social History: Last updated: 07/12/2009 Occupation: Retired Patient is a former smoker: quit 30 yrs ago  Alcohol Use - no Illicit Drug  Use - no  Risk Factors: Smoking Status: quit (07/12/2009)  Review of Systems      See HPI  Vital Signs:  Patient profile:   70 year old male Height:      66.5 inches Weight:      175.25 pounds BMI:     27.96 O2 Sat:      99 % on Room air Temp:     97.7 degrees F oral Pulse rate:   81 / minute BP sitting:   124 / 84  (left arm) Cuff size:   regular  Vitals Entered By: Boone Master CNA/MA (May 22, 2010 4:14 PM)  O2 Flow:  Room air CC: dry cough x2weeks, denies wheezing, SOB, mucus production - was levaquin at UC, finished yesterday.  sharp CP left side, occ radiation into the left shoulder/arm x5days - denies vision changes, difficulty swallowing Is Patient Diabetic? No Comments Medications reviewed with patient Daytime contact number verified with patient. Boone Master CNA/MA  May 22, 2010 4:12 PM    Physical Exam  Additional Exam:  GEN: A/Ox3; pleasant , NAD HEENT:  [place]/AT, , EACs-clear, TMs-wnl, NOSE-clear, THROAT-clear NECK:  Supple w/ fair ROM; no JVD; normal carotid impulses w/o bruits; no thyromegaly or nodules palpated; no lymphadenopathy. RESP  Clear to P & A; w/o, wheezes/ rales/ or rhonchi CARD:  RRR, no m/r/g   GI:   Soft & nt; nml bowel sounds; no organomegaly or masses detected. Musco: Warm bil,  no calf tenderness edema, clubbing, pulses intact tender along upper chest wall, left cervical neck, nml rom of shoulder and neck w/ no radicular symptoms, nml grips bilaterally,  no rash noted.  Neuro: EOM-wnl, PERRLA, CN 2-12 intact,nml equal grips/streng    Impression & Recommendations:  Problem # 1:  CHEST WALL PAIN, ANTERIOR (ICD-786.52)  Suspect multifactoral w/ recent bronchitis, muscle strain from lifting and possible reflux . EKG w/ no acute changes  Plan:   Increase Prilosec 20mg  two times a day for 2 weeks then once daily  Warm heat to area three times a day as needed  Advil 200mg  3 tabs two times a day for 5 days  Flexeril 5mg  two  times a day as needed muscle spasm  Please contact office for sooner follow up if symptoms do not improve or worsen   Orders: Est. Patient Level IV ([address])  Medications Added to Medication List This Visit: 1)  Flexeril 5 Mg Tabs (Cyclobenzaprine hcl) .[name].. 1 by mouth two times a day as needed muscle spasm  Complete Medication List: 1)  Amlodipine Besylate 5 Mg Tabs (Amlodipine besylate) .... Take 1 tablet by mouth once a day 2)  Simvastatin 20 Mg Tabs (Simvastatin) .... Take 1 tablet by mouth at bedtime.Marland KitchenMarland Kitchen 3)  Prilosec 20 Mg Cpdr (Omeprazole) .... Take one tab daily 4)  Finasteride 5 Mg Tabs (Finasteride) .[name].. 1 by mouth once daily 5)  Viagra 100 Mg Tabs (Sildenafil citrate) .... Use as directed 6)  Flexeril 5 Mg Tabs (Cyclobenzaprine hcl) .[name].. 1 by mouth two times a day as needed muscle spasm  Patient Instructions: 1)  Increase Prilosec 20mg  two times a day for 2 weeks then once daily  2)  Warm heat to area three times a day as  needed  3)  Advil 200mg  3 tabs two times a day for 5 days  4)  Flexeril 5mg  two times a day as needed muscle spasm  5)  Please contact office for sooner follow up if symptoms do not improve or worsen  Prescriptions: SIMVASTATIN 20 MG  TABS (SIMVASTATIN) Take 1 tablet by mouth at bedtime...  #90 x 3   Entered and Authorized by:   Rubye Oaks NP   Signed by:   Rubye Oaks NP on 05/22/2010   Method used:   Electronically to        [facility] (retail)       [address]       [place], [place]  [address]       Ph: [phone]       Fax: [phone]   RxID:   [id number] FLEXERIL 5 MG TABS (CYCLOBENZAPRINE HCL) 1 by mouth two times a day as needed muscle spasm  #30 x 0   Entered and Authorized by:   Rubye Oaks NP   Signed by:   Rubye Oaks NP on 05/22/2010   Method used:   Electronically to        [facility] (retail)       [address]       [place], [place]  [address]        Ph: [phone]       Fax: [phone]   RxID:   [id number]

## 2010-08-01 NOTE — Letter (Signed)
Summary: [facility] Instructions  [facility]  [address] [place], [place] [address]   Phone: [phone]  Fax: [phone]       Alan [name]    [birth year]    MRN: [id number]        Procedure Day /Date:07/13/2009  WED     Arrival Time:8:00AM     Procedure Time:9:00AM     Location of Procedure:                    X  [facility] (4th Floor)   PREPARATION FOR COLONOSCOPY WITH MOVIPREP   Starting 5 days prior to your procedure TODAY do not eat nuts, seeds, popcorn, corn, beans, peas,  salads, or any raw vegetables.  Do not take any fiber supplements (e.g. Metamucil, Citrucel, and Benefiber).  THE DAY BEFORE YOUR PROCEDURE         DATE: 07/12/2009 DAY: TUES  1.  Drink clear liquids the entire day-NO SOLID FOOD  2.  Do not drink anything colored red or purple.  Avoid juices with pulp.  No orange juice.  3.  Drink at least 64 oz. (8 glasses) of fluid/clear liquids during the day to prevent dehydration and help the prep work efficiently.  CLEAR LIQUIDS INCLUDE: Water Jello Ice Popsicles Tea (sugar ok, no milk/cream) Powdered fruit flavored drinks Coffee (sugar ok, no milk/cream) Gatorade Juice: apple, white grape, white cranberry  Lemonade Clear bullion, consomm, broth Carbonated beverages (any kind) Strained chicken noodle soup Hard Candy                             4.  In the morning, mix first dose of MoviPrep solution:    Empty 1 Pouch A and 1 Pouch B into the disposable container    Add lukewarm drinking water to the top line of the container. Mix to dissolve    Refrigerate (mixed solution should be used within 24 hrs)  5.  Begin drinking the prep at 5:00 p.m. The MoviPrep container is divided by 4 marks.   Every 15 minutes drink the solution down to the next mark (approximately 8 oz) until the full liter is complete.   6.  Follow completed prep with 16 oz of clear liquid of your choice (Nothing red or purple).  Continue to drink  clear liquids until bedtime.  7.  Before going to bed, mix second dose of MoviPrep solution:    Empty 1 Pouch A and 1 Pouch B into the disposable container    Add lukewarm drinking water to the top line of the container. Mix to dissolve    Refrigerate  THE DAY OF YOUR PROCEDURE      DATE: 07/13/2009 DAY: WED  Beginning at 4:00a.m. (5 hours before procedure):         1. Every 15 minutes, drink the solution down to the next mark (approx 8 oz) until the full liter is complete.  2. Follow completed prep with 16 oz. of clear liquid of your choice.    3. You may drink clear liquids until 7:00AM (2 HOURS BEFORE PROCEDURE).   MEDICATION INSTRUCTIONS  Unless otherwise instructed, you should take regular prescription medications with a small sip of water   as early as possible the morning of your procedure.           OTHER INSTRUCTIONS  You will need a responsible adult at least 70 years of age to accompany  you and drive you home.   This person must remain in the waiting room during your procedure.  Wear loose fitting clothing that is easily removed.  Leave jewelry and other valuables at home.  However, you may wish to bring a book to read or  an iPod/MP3 player to listen to music as you wait for your procedure to start.  Remove all body piercing jewelry and leave at home.  Total time from sign-in until discharge is approximately 2-3 hours.  You should go home directly after your procedure and rest.  You can resume normal activities the  day after your procedure.  The day of your procedure you should not:   Drive   Make legal decisions   Operate machinery   Drink alcohol   Return to work  You will receive specific instructions about eating, activities and medications before you leave.    The above instructions have been reviewed and explained to me by   _______________________    I fully understand and can verbalize these instructions  _____________________________ Date _________

## 2010-08-01 NOTE — Progress Notes (Signed)
Summary: Medication   Phone Note Call from Patient Call back at [phone]   Caller: Patient Call For: Dr. Arlyce Dice Reason for Call: Talk to Nurse Summary of Call: Pt went to pharmacy and was given Nexium. He is wanting his Prilosec called in Initial call taken by: Karna Christmas,  August 24, 2009 11:18 AM  Follow-up for Phone Call        [facility] pharmacy they stated they filled a prescription of Nexium that was sent in from [facility] from back in December. Explained to them to disregard the Nexium and fills pts prilosec. Called pt to inform Follow-up by: Merri Ray CMA Duncan Dull),  August 24, 2009 11:50 AM

## 2010-08-01 NOTE — Assessment & Plan Note (Signed)
Summary: pcn inj/klw   Nurse Visit   Allergies: 1)  ! 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

## 2010-08-01 NOTE — Letter (Signed)
Summary: *Referral Letter  [facility]  [address]   [place], [place] [address]   Phone: [phone]  Fax: [phone]    02/07/2010 Dr. Terrace Arabia:   Thank you in advance for agreeing to see our patient:. He is  70 year old male pt of Dr. Kriste Basque  that is seen at our office for general medical issues. He was seen for routine follow up recently. He complained of persistent memory decline. He was referred to your office for evaluation. 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 He has denied knowledge of previous syphyllis infection or treatment. We recommended Pencillin injections weekly for 3 weeks. He subsequently [facility] this [facility]. Labs there confirmed that he had positive RPR/titer. Dr. Burnice Logan from ID called our office and has recommended that he will need a neuro workup to rule out neuro syphyllis w/ lumbar puncture. He will be seeing you on 02/17/10. Thank you again for your assessment in advance. Please let our office know if you need any additional information.   [name] [address] [place], [place]  [address]  Phone: [phone]      Current Medical Problems:  OBSTRUCTIVE SLEEP APNEA (ICD-327.23) - s/p sleep study 1/08 showing RDI=16, and desat to 89%... he had 4+leg jerks and given trial Requip... -not tolerate  HYPERTENSION (ICD-401.9) - controlled on NORVASC 5mg /d, and prev on Cardura but this was switched by [name]...   he had a Cardiolite scan in 1997 that was neg/ normal w/ EF=75%...  HYPERCHOLESTEROLEMIA (ICD-272.0) - on SIMVASTATIN 20mg /d...   ~  FLP 3/08 showed TChol 157, TG 53, HDL 53, LDL 94...   ~  FLP 3/09 showed TChol 124, TG 33, HDL 44, LDL 74...   GERD (ICD-530.81) - on PREVACID 30mg /d and ZANTAC 300mg Qhs   ~  last EGD was 5/02 showing esophagitis and gastritis...  DIVERTICULOSIS OF COLON (ICD-562.10) - on Metamucil daily...  IRRITABLE BOWEL SYNDROME  (ICD-564.1) COLONIC POLYPS (ICD-211.3) - last colonoscopy was 5/07 by DrSam showing divertics and 3-68mm adenomatous polyps removed.[name]   BENIGN PROSTATIC HYPERTROPHY, HX OF (ICD-V13.8) -  ERECTILE DYSFUNCTION (ICD-302.72) - Tried all 3 PDE4 inhibitors,   ~  labs in 2010 at Urology office was 1.47 per patient.  RESTLESS LEG SYNDROME (ICD-333.94) - prev on REQUIP 1mg Qhs, but stopped this med due to stomach side effects ...   ANXIETY (ICD-300.00) - he does not desire anxiolytic Rx...  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   Current Medications: 1)  AMLODIPINE BESYLATE 5 MG  TABS (AMLODIPINE BESYLATE) Take 1 tablet by mouth once a day 2)  SIMVASTATIN 20 MG  TABS (SIMVASTATIN) Take 1 tablet by mouth at bedtime.Marland KitchenMarland Kitchen 3)  PRILOSEC 20 MG CPDR (OMEPRAZOLE) take one tab daily 4)  FINASTERIDE 5 MG TABS (FINASTERIDE) 1 by mouth once daily 5)  VIAGRA 100 MG TABS (SILDENAFIL CITRATE) use as directed       Thank you again for agreeing to see our patient; please contact us if you have any further questions or need additional information.  Sincerely,  Rubye Oaks NP

## 2010-08-01 NOTE — Letter (Signed)
Summary: Patient Notice- Polyp Results  [facility]  [address] [place], [place] [address]   Phone: [phone]  Fax: [phone]        July 18, 2009 MRN: [id number]    Alan [name] [address] [place], [place]  [address]    Dear Alan [name],  I am pleased to inform you that the colon polyp(s) removed during your recent colonoscopy was (were) found to be benign (no cancer detected) upon pathologic examination.  I recommend you have a repeat colonoscopy examination in 3_ years to look for recurrent polyps, as having colon polyps increases your risk for having recurrent polyps or even colon cancer in the future.  Should you develop new or worsening symptoms of abdominal pain, bowel habit changes or bleeding from the rectum or bowels, please schedule an evaluation with either your primary care physician or with me.  Additional information/recommendations:  __ No further action with gastroenterology is needed at this time. Please      follow-up with your primary care physician for your other healthcare      needs.  __ Please call [phone] to schedule a return visit to review your      situation.  __ Please keep your follow-up visit as already scheduled.  _x_ Continue treatment plan as outlined the day of your exam.  Please call us if you are having persistent problems or have questions about your condition that have not been fully answered at this time.  Sincerely,  Louis Meckel MD  This letter has been electronically signed by your physician.  Appended Document: Patient Notice- Polyp Results Letter mailed 1.18.11

## 2010-08-01 NOTE — Progress Notes (Signed)
Summary: chest pain  Phone Note Call from Patient Call back at Home Phone [phone]   Caller: Spouse--SHIRLEY Call For: nadel Reason for Call: Talk to Nurse Summary of Call: Patient has had a bad cough x 2 weeks, now is having left sided chest pain and pain in arm.  Patient's wife states pain comes and goes, pain will wake him up at night sometimes.  No chest pain right now. Initial call taken by: Lehman Prom,  May 22, 2010 9:39 AM  Follow-up for Phone Call        PT reports cough for 2 weeks.  Non prod at present.  Seen at Urgent Care on 05-17-10 given Levaquin 750mg  for 5 days.  C/O chest pain described as sharp pain that comes and goes with some radiation to left shoulder and through to back.  Pt denies nausea or vomiting or fever.  Aptt today with Tammy Parrett.

## 2010-08-01 NOTE — Procedures (Signed)
Summary: Upper Endoscopy  Patient: Alan [name] Note: All result statuses are Final unless otherwise noted.  Tests: (1) Upper Endoscopy (EGD)   EGD Upper Endoscopy       DONE (C)     [facility]     [address]     [place], [place]  [address]           ENDOSCOPY PROCEDURE REPORT           PATIENT:  Alan [name], Alan [name]  MR#:  [id number]     BIRTHDATE:  Jun 04, 1941, 68 yrs. old  GENDER:  male           ENDOSCOPIST:  Arend Bahl. Arlyce Dice, MD     Referred by:  Alroy Dust, M.D.           PROCEDURE DATE:  07/21/2009     PROCEDURE:  EGD with biopsy     ASA CLASS:  Class II     INDICATIONS:  GERD           MEDICATIONS:   Fentanyl 50 mcg IV, Versed 4 mg IV, glycopyrrolate     (Robinal) 0.2 mg IV, 0.6cc simethancone 0.6 cc PO     TOPICAL ANESTHETIC:  Exactacain Spray           DESCRIPTION OF PROCEDURE:   After the risks benefits and     alternatives of the procedure were thoroughly explained, informed     consent was obtained.  The LB [id number]-[id number] endoscope was     introduced through the mouth and advanced to the third portion of     the duodenum, without limitations.  The instrument was slowly     withdrawn as the mucosa was fully examined.     <<PROCEDUREIMAGES>>           Multiple erosions were found at the gastroesophageal junction (see     image3 and image1). Biopsies taken to r/o Barrett's esophagus     Duodenitis was found in the bulb of the duodenum (see image2).     Retroflexed views revealed no abnormalities.    The scope was then     withdrawn from the patient and the procedure completed.           COMPLICATIONS:  None           ENDOSCOPIC IMPRESSION:     1) Erosions, multiple at the gastroesophageal junction     2) Duodenitis in the bulb of duodenum     RECOMMENDATIONS:     1) continue PPI     2) Call office next 2-3 days to schedule an office appointment     for 1 month     3) await biopsy results           REPEAT EXAM:  No        ______________________________     Barbette Hair. Arlyce Dice, MD           CC:           n.     REVISED:  07/21/2009 10:29 AM     eSIGNED:   Barbette Hair. Alfreddie Consalvo at 07/21/2009 10:29 AM           [name], [phone]  Note: An exclamation mark (!) indicates a result that was not dispersed into the flowsheet. Document Creation Date: 07/21/2009 10:29 AM _______________________________________________________________________  (1) Order result status: Final Collection or observation date-time: 07/21/2009 09:35 Requested date-time:  Receipt date-time:  Reported date-time:  Referring Physician:  Ordering Physician: Melvia Heaps [phone]) Specimen Source:  Source: Launa Grill Order Number: [id number] Lab site:

## 2010-08-01 NOTE — Procedures (Signed)
Summary: Colonoscopy  Patient: [name] Note: All result statuses are Final unless otherwise noted.  Tests: (1) Colonoscopy (COL)   COL Colonoscopy           DONE     [facility]     [address]     [place], [place]  [address]           COLONOSCOPY PROCEDURE REPORT           PATIENT:  Alan [name], Alan [name]  MR#:  [id number]     BIRTHDATE:  06-Jun-1941, 68 yrs. old  GENDER:  male           ENDOSCOPIST:  Jejuan Scala. Arlyce Dice, MD     Referred by:           PROCEDURE DATE:  07/13/2009     PROCEDURE:  Colonoscopy with snare polypectomy, Colon with cold     biopsy polypectomy     ASA CLASS:  Class II     INDICATIONS:  history of pre-cancerous (adenomatous) colon polyps                 MEDICATIONS:   Fentanyl 75 mcg IV, Versed 7 mg IV           DESCRIPTION OF PROCEDURE:   After the risks benefits and     alternatives of the procedure were thoroughly explained, informed     consent was obtained.  Digital rectal exam was performed and     revealed no abnormalities.   The LB CF-[id number] endoscope     was introduced through the anus and advanced to the cecum, which     was identified by the ileocecal valve, without limitations.  The     quality of the prep was good, using MoviPrep.  The instrument was     then slowly withdrawn as the colon was fully examined.     <<PROCEDUREIMAGES>>           FINDINGS:  A pedunculated polyp was found at the hepatic flexure.     It was 7 mm in size. Polyp was snared, then cauterized with     monopolar cautery. Retrieval was unsuccessful (see image6). snare     polyp  A second pedunculated polyp was found at the hepatic     flexure. It was 10 mm in size. Because of superior position of     hepatic flexure, both polyps fell from the polypectomy sites and     were unable to be relocated. Polyp was snared, then cauterized     with monopolar cautery. Retrieval was unsuccessful (see image9).     snare polyp  A sessile polyp was found in the  proximal transverse     colon. It was 2 mm in size. The polyp was removed using cold     biopsy forceps (see image8).  This was otherwise a normal     examination of the colon (see image1, image3, image4, image5,     image7, image10, image11, image15, image16, image18, and image19).     Retroflexed views in the rectum revealed no abnormalities.    The     scope was then withdrawn from the patient and the procedure     completed.           COMPLICATIONS:  None           ENDOSCOPIC IMPRESSION:     1) 7 mm pedunculated polyp at the hepatic flexure     2)  10 mm pedunculated polyp at the hepatic flexure     3) 2 mm sessile polyp in the proximal transverse colon     4) Otherwise normal examination     RECOMMENDATIONS:     1) colonoscopy     3 years because of recurrent multiple polyps           REPEAT EXAM:  In 3 year(s) for Colonoscopy.           ______________________________     Barbette Hair. Arlyce Dice, MD           CC:  Michele Mcalpine, MD           n.     Rosalie Doctor:   Barbette Hair. Sherma Vanmetre at 07/13/2009 09:38 AM           Page 2 of 3   [name], [id number]  Note: An exclamation mark (!) indicates a result that was not dispersed into the flowsheet. Document Creation Date: 07/13/2009 9:38 AM _______________________________________________________________________  (1) Order result status: Final Collection or observation date-time: 07/13/2009 09:26 Requested date-time:  Receipt date-time:  Reported date-time:  Referring Physician:   Ordering Physician: Melvia Heaps [phone]) Specimen Source:  Source: Launa Grill Order Number: [id number] Lab site:   Appended Document: Colonoscopy recall     Procedures Next Due Date:    Colonoscopy: 07/2012

## 2010-08-01 NOTE — Letter (Signed)
Summary: Results Letter  [facility]  [address] [place], [place] [address]   Phone: [phone]  Fax: [phone]        July 26, 2009 MRN: [id number]    Alan [name] [address] [place], [place]  [address]    Dear Mr. WARREN,   Your biopsies demonstrated inflammatory changes only.    Please follow the recommendations previously discussed.  Should you have any immediate concerns or questions, feel free to contact me at the office.    Sincerely,  Barbette Hair. Arlyce Dice, M.D., [facility]          Sincerely,  Louis Meckel MD  This letter has been electronically signed by your physician.  Appended Document: Results Letter Letter mailed 1.27.11

## 2010-08-01 NOTE — Progress Notes (Signed)
Summary: resend rx   Phone Note Call from Patient Call back at Home Phone [phone]   Caller: Patient Call For: Arlyce Dice Reason for Call: Talk to Nurse Summary of Call: Prilosec not in his pharmacy please resend to [facility] Initial call taken by: Tawni Levy,  August 22, 2009 8:33 AM  Follow-up for Phone Call        Called pt and left message the script was sent on 08/10/2009 electronically with 11 refills. Explained to him to check back with his phamracy. Follow-up by: Merri Ray CMA Duncan Dull),  August 22, 2009 9:16 AM

## 2010-08-01 NOTE — Assessment & Plan Note (Signed)
Summary: constipation, discuss colon/lk    History of Present Illness Visit Type: follow up  Primary GI MD: Melvia Heaps MD [facility] Primary Provider: Alroy Dust, MD  Requesting Provider: n/a Chief Complaint: constipation and acid reflux  History of Present Illness:   Alan [name] is a 70 year old at [employer] male referred for evaluation of reflux and constipation.  Despite taking daily Zantac he has had episodes as severe burning discomfort beginning in his upper abdomen and radiating into his chest.  With this he may have regurgitation of gastric contents.  He denies dysphagia.  Alan [name] is also complaining of severe constipation.  He is unable to move his bowels without the help of a cathartic.  This has occurred over the past year.  He has a history of colonic polyposis.  Last colonoscopy in 2007 demonstrated multiple polyps in the sigmoid colon, transverse and descending colon, and diverticulosis.   GI Review of Systems    Reports acid reflux.      Denies abdominal pain, belching, bloating, chest pain, dysphagia with liquids, dysphagia with solids, heartburn, loss of appetite, nausea, vomiting, vomiting blood, weight loss, and  weight gain.      Reports constipation.     Denies anal fissure, black tarry stools, change in bowel habit, diarrhea, diverticulosis, fecal incontinence, heme positive stool, hemorrhoids, irritable bowel syndrome, jaundice, light color stool, liver problems, rectal bleeding, and  rectal pain.    Current Medications (verified): 1)  Amlodipine Besylate 5 Mg  Tabs (Amlodipine Besylate) .... Take 1 Tablet By Mouth Once A Day 2)  Simvastatin 20 Mg  Tabs (Simvastatin) .... Take 1 Tablet By Mouth At Bedtime.Marland KitchenMarland Kitchen 3)  Zantac 300 Mg  Tabs (Ranitidine Hcl) .... Take 1 Tablet By Mouth Once A Day 4)  Flomax 0.4 Mg Xr24h-Cap (Tamsulosin Hcl) .... Take 1 Tab By Mouth At Bedtime.Marland KitchenMarland Kitchen 5)  Viagra 100 Mg Tabs (Sildenafil Citrate) .... Use As Directed 6)  Requip 1 Mg Tabs (Ropinirole  Hcl) .... One Tablet By Mouth Once Daily  Allergies (verified): 1)  ! Codeine  Past History:  Past Medical History: Reviewed history from 10/15/2008 and no changes required. 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.1)  Past Surgical History: Unremarkable  Family History: Family History of Colon Cancer: PGF  Social History: Occupation: Retired Patient is a former smoker: quit 30 yrs ago  Alcohol Use - no Illicit Drug Use - no Drug Use:  no  Review of Systems  The patient denies allergy/sinus, anemia, anxiety-new, arthritis/joint pain, back pain, blood in urine, breast changes/lumps, change in vision, confusion, cough, coughing up blood, depression-new, fainting, fatigue, fever, headaches-new, hearing problems, heart murmur, heart rhythm changes, itching, muscle pains/cramps, night sweats, nosebleeds, shortness of breath, skin rash, sleeping problems, sore throat, swelling of feet/legs, swollen lymph glands, thirst - excessive, urination - excessive, urination changes/pain, urine leakage, vision changes, and voice change.    Vital Signs:  Patient profile:   70 year old male Height:      66.5 inches Weight:      172 pounds BMI:     27.44 BSA:     1.89 Pulse rate:   88 / minute Pulse rhythm:   regular BP sitting:   136 / 84  (left arm) Cuff size:   regular  Vitals Entered By: Ok Anis  CMA (July 12, 2009 2:00 PM)  Physical Exam  Additional Exam:  He is a healthy-appearing male  skin: anicteric HEENT: normocephalic; PEERLA; no nasal or pharyngeal abnormalities neck: supple nodes: no cervical lymphadenopathy chest: clear to  ausculatation and percussion heart: no murmurs, gallops, or rubs abd: soft, nontender; BS normoactive; no abdominal masses, tenderness, organomegaly rectal: deferred ext: no cynanosis, clubbing, edema skeletal: no deformities neuro: oriented x 3; no focal abnormalities    Impression & Recommendations:  Problem # 1:  GERD (ICD-530.81)  He is having worsening symptoms despite therapy with Zantac.  The patient's #1 begin omeprazole 20 mg daily #2 upper endoscopy  Orders: EGD (EGD)  Problem # 2:  COLONIC POLYPS (ICD-211.3)  Plan followup colonoscopy  Orders: Colonoscopy (Colon)  Problem # 3:  OTHER CONSTIPATION (ICD-564.09)  Symptoms are probably functional.  Colonic narrowing from diverticular disease or, less likely, a fixed a stricture ought to be ruled out.  Recommendations #1 colonoscopy  Orders: Colonoscopy (Colon)  Patient Instructions: 1)  Your colonoscopy is scheduled for 07/13/2009 at 9am 2)  We are sending your MoviPrep in today 3)  Your EGD is scheduled for 07/21/2009 at 9am 4)  The medication list was reviewed and reconciled.  All changed / newly prescribed medications were explained.  A complete medication list was provided to the patient / caregiver. Prescriptions: MOVIPREP 100 GM  SOLR (PEG-KCL-NACL-NASULF-NA ASC-C) As per prep instructions.  #1 x 0   Entered by:   Merri Ray CMA (AAMA)   Authorized by:   Louis Meckel MD   Signed by:   Merri Ray CMA (AAMA) on 07/12/2009   Method used:   Electronically to        [facility] (retail)       [address]       [place], [place]  [address]       Ph: [phone]       Fax: [phone]   RxID:   [id number] PRILOSEC 20 MG CPDR (OMEPRAZOLE) take one tab daily  #30 x 0   Entered and Authorized by:   Louis Meckel MD   Signed by:   Louis Meckel MD on 07/12/2009   Method used:   Electronically to        [facility] (retail)       [address]       [place], [place]  [address]       Ph: [phone]       Fax: [phone]   RxID:   [id number]

## 2010-08-01 NOTE — Progress Notes (Signed)
Summary: re: referral -- awaiting fax  Phone Note Call from Patient Call back at Home Phone [phone]   Caller: Patient Call For: Kriste Basque Reason for Call: Talk to Nurse Summary of Call: need to speak to Triage re: referral from [facility] - didn't want to give me any info, including this. Initial call taken by: Eugene Gavia,  March 01, 2010 11:56 AM  Follow-up for Phone Call        called spoke with patient who states that Dr. Zannie Cove office is telling him that we will be contacting him about his treatment.  i do not see any correspondence from Dr. Zannie Cove office regarding this in EMR.  I have LM on Dr. Zannie Cove nurse's VM to call back (Jan @ [phone]).  Dr. Kriste Basque, have you had any contact from Dr. Zannie Cove office regarding pt? Boone Master CNA/MA  March 01, 2010 12:10 PM   received LP results from [facility].  LMTCB for Jan: who will be providing treatment, [place] or neuro?    Jan returned my call.  she states that she will call pt with the MRI results, but will confer w/ Dr. Terrace Arabia on the LP results and call me back. Boone Master CNA/MA  March 02, 2010 12:14 PM   Jan returned my call.  she states that per their records they have not notified pt telling him our office will contact him.  she states that pt's LP was done 8-22 and the MRI of brain was done 8-25.  perhaps he is calling wanting those results.  will still forward to SN to see if he has spoken with Dr. Terrace Arabia personally regarding pt before telling patient to call Yan's office for results. Boone Master CNA/MA  March 01, 2010 12:25 PM   Additional Follow-up for Phone Call Additional follow up Details #1::        Jan returned call.  In summary and clarrification, Jan states they will inform pt of the MRI results but SN needs to call pt with the results of the LP and treatmetn/recs for the LP.  She will fax over MRI results along with letter from Dr. Terrace Arabia that has the LP results included just in case SN does not have them.  Will await fax then forward  to SN to address. Additional Follow-up by: Gweneth Dimitri RN,  March 02, 2010 3:35 PM    Additional Follow-up for Phone Call Additional follow up Details #2::    Fax received.  Will forward to SN to address.  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  Additional Follow-up by: Rubye Oaks NP,  March 02, 2010 4:47 PM

## 2010-08-03 NOTE — Progress Notes (Signed)
Summary: nos appt  Phone Note Call from Patient   Caller: [email or web address]  Call For: Cary Lothrop Summary of Call: LMTCB x2 to rsc nos from 1/11. Initial call taken by: Darletta Moll,  July 13, 2010 3:35 PM

## 2010-08-04 NOTE — Letter (Signed)
Summary: [facility]  [facility]   Imported By: Lester Salem 03/08/2010 09:22:06  _____________________________________________________________________  External Attachment:    Type:   Image     Comment:   External Document

## 2010-08-31 ENCOUNTER — Encounter: Payer: Self-pay | Admitting: Pulmonary Disease

## 2010-08-31 ENCOUNTER — Encounter (INDEPENDENT_AMBULATORY_CARE_PROVIDER_SITE_OTHER): Payer: Medicare Other | Admitting: Pulmonary Disease

## 2010-08-31 ENCOUNTER — Ambulatory Visit (INDEPENDENT_AMBULATORY_CARE_PROVIDER_SITE_OTHER)
Admission: RE | Admit: 2010-08-31 | Discharge: 2010-08-31 | Disposition: A | Discharge status: Home / Self Care | Payer: Medicare Other | Source: Ambulatory Visit | Attending: Pulmonary Disease | Admitting: Pulmonary Disease

## 2010-08-31 ENCOUNTER — Other Ambulatory Visit: Payer: Self-pay | Admitting: Pulmonary Disease

## 2010-08-31 ENCOUNTER — Other Ambulatory Visit: Payer: Medicare Other

## 2010-08-31 DIAGNOSIS — K5730 Diverticulosis of large intestine without perforation or abscess without bleeding: Secondary | ICD-10-CM

## 2010-08-31 DIAGNOSIS — E7800 Pure hypercholesterolemia, unspecified: Secondary | ICD-10-CM

## 2010-08-31 DIAGNOSIS — R413 Other amnesia: Secondary | ICD-10-CM

## 2010-08-31 DIAGNOSIS — I10 Essential (primary) hypertension: Secondary | ICD-10-CM

## 2010-08-31 DIAGNOSIS — G2581 Restless legs syndrome: Secondary | ICD-10-CM

## 2010-08-31 DIAGNOSIS — D126 Benign neoplasm of colon, unspecified: Secondary | ICD-10-CM

## 2010-08-31 DIAGNOSIS — F528 Other sexual dysfunction not due to a substance or known physiological condition: Secondary | ICD-10-CM

## 2010-08-31 DIAGNOSIS — K219 Gastro-esophageal reflux disease without esophagitis: Secondary | ICD-10-CM

## 2010-08-31 DIAGNOSIS — F411 Generalized anxiety disorder: Secondary | ICD-10-CM

## 2010-08-31 DIAGNOSIS — Z87898 Personal history of other specified conditions: Secondary | ICD-10-CM

## 2010-08-31 LAB — CBC WITH DIFFERENTIAL/PLATELET
Basophils Relative: 0.5 % (ref 0.0–3.0)
Eosinophils Relative: 1.5 % (ref 0.0–5.0)
Hemoglobin: 14.4 g/dL (ref 13.0–17.0)
MCV: 89 fl (ref 78.0–100.0)
Monocytes Absolute: 0.4 10*3/uL (ref 0.1–1.0)
Neutro Abs: 1.8 10*3/uL (ref 1.4–7.7)
Neutrophils Relative %: 44.1 % (ref 43.0–77.0)
RBC: 4.8 Mil/uL (ref 4.22–5.81)
WBC: 4.1 10*3/uL — ABNORMAL LOW (ref 4.5–10.5)

## 2010-08-31 LAB — BASIC METABOLIC PANEL
BUN: 16 mg/dL (ref 6–23)
Calcium: 9.2 mg/dL (ref 8.4–10.5)
Creatinine, Ser: 1.1 mg/dL (ref 0.4–1.5)
GFR: 85.99 mL/min (ref >60.00)

## 2010-08-31 LAB — TSH: TSH: 0.81 u[IU]/mL (ref 0.35–5.50)

## 2010-08-31 LAB — LIPID PANEL
HDL: 49.2 mg/dL (ref >39.00)
LDL Cholesterol: 89 mg/dL (ref 0–99)
Total CHOL/HDL Ratio: 3
Triglycerides: 47 mg/dL (ref 0.0–149.0)

## 2010-08-31 LAB — HEPATIC FUNCTION PANEL
ALT: 15 U/L (ref 0–53)
Bilirubin, Direct: 0.1 mg/dL (ref 0.0–0.3)
Total Bilirubin: 0.7 mg/dL (ref 0.3–1.2)

## 2010-09-12 NOTE — Assessment & Plan Note (Signed)
Summary: f/u exam   Primary Care Provider:  Alroy Dust, MD   CC:  2 year ROV & review of mult medical problems....  History of Present Illness: 70 y/o BM here for a follow up visit... he has mult med problems as noted below...    ~  Apr10:  he feels as though he has been doing well except that he worries about his memory- mostly forgetting names etc... we discussed "benign forgetfullness" vs early stage alzheimers and recommended mental exercises w/ scrabble, puzzles, etc...   ~  August 31, 2010:  2 year ROV & review> requests 90d refills today...    Azarel had further memory concerns & eval here & by [name] Neuro showed no signif atrophy but +sm vessel changes & part empty sella on MRI, and labs all essent WNL x +FTA, no known prev hx & never treated, CSF eval was also neg & he received series of 3 shots LA Bicillin last 03/28/10...    He had GI eval from [facility] 1/11 w/ EGD showing mult GE erosions & duodenitis- treated w/ PPI Prilosec & improved;  colonoscopy at the same time showed several polyps- benign mucosa w/o adenomatous change seen...    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    He had bronchitic infection 11/11 & seen at [facility] w/ Levaquin Rx;  also had sharp CWP from the cough, slowly improved over time...    Finally, he was dx w/ Glaucoma on [name] gtts per [name] & may need laser surg...    Current Problems:   OBSTRUCTIVE SLEEP APNEA (ICD-327.23) - s/p sleep study 1/08 showing RDI=16, and desat to 89%... he had 4+leg jerks and given trial Requip (?helped)... states he rests well and wakes feeling refreshed most of the time "I live w/ it, it's not that bad"...  HYPERTENSION (ICD-401.9) - controlled on NORVASC  5mg /d, and prev on Cardura but this was switched by [name]... BP=120/82, tol meds well... denies HA, fatigue, visual changes, CP, palipit, dizziness, syncope, dyspnea, edema, etc... he had a Cardiolite scan in 1997 that was neg/ normal w/ EF=75%...  HYPERCHOLESTEROLEMIA (ICD-272.0) - on SIMVASTATIN 20mg /d + diet...   ~  FLP 3/08 showed TChol 157, TG 53, HDL 53, LDL 94  ~  FLP 3/09 showed TChol 124, TG 33, HDL 44, LDL 74  ~  FLP 4/10 showed TChol 123, TG 36, HDL 41, LDL 75  ~  FLP 3/12 showed TChol 148, TG 47, HDL 49, LDL 89... continue Simva20.  GERD (ICD-530.81) - prev rec to take Prevacid 30mg  & Zantac 150Qhs... GI eval 1/11 by Dorris Singh w/ rec to take PRILOSEC daily  ~  EGD 5/02 showing esophagitis and gastritis...  ~  EGD 1/11 showed GE junction erosions & duodenitis... Rx Prilosec & improved symptomatically.  DIVERTICULOSIS OF COLON (ICD-562.10) - on Metamucil daily...  IRRITABLE BOWEL SYNDROME (ICD-564.1) COLONIC POLYPS (ICD-211.3)  ~  colonoscopy 5/07 by DrSam showing divertics and 3-55mm adenomatous polyps removed... f/u planned 77yrs...  ~  colonoscopy 1/11 w/ several pedunculated polyps & path was neg- benign colonic mucosa w/o adenomatous change.  BENIGN PROSTATIC HYPERTROPHY, HX OF (ICD-V13.8) - seen by DrHumphries previously... now seeing [name] but pt doesn't feel  that he is doing as well on the [facility] 0.4mg /d (prev on [name] + Cardura)... I've encouraged him to discuss this w/ [name]... he sought 2nd opinion at [facility], [name]- enlarged prostate & median lobe, w/ low vol- hi press detrusor contraction, they rec surg for his bladder outlet obstruction but he declined & opted for FISASTERIDE 5mg /d & improved he says...  ERECTILE DYSFUNCTION (ICD-302.72) - Tried all 3 PDE4 inhibitors, but not much benefit... consider shots.   ~  labs in 2010 at Urology office was 1.47 per patient.  ~  2001: he tells me he had his PSA checked at [facility] & it was  "OK"...  RESTLESS LEG SYNDROME (ICD-333.94) - prev on REQUIP 1mg Qhs, but stopped this med due to stomach side effects ... he notes no diff w/ resting- good nights, and bad nights...   ANXIETY (ICD-300.00) - he does not desire anxiolytic Rx...  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   Preventive Screening-Counseling & Management  Alcohol-Tobacco     Smoking Status: quit     Year Quit: 1970's  Allergies: 1)  ! Codeine  Comments:  Nurse/Medical Assistant: The patient's medications and allergies were reviewed with the patient and were updated in the Medication and Allergy Lists.  Past History:  Past Medical History: 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.1)  Family History: Reviewed history from 07/12/2009 and no changes required. Family History of Colon Cancer: PGF  Social History: Reviewed history from 07/12/2009 and no changes required. Occupation: Retired Patient is a former smoker: quit 30 yrs ago in the 1970's Alcohol Use - no Illicit Drug Use - no  Review of Systems      See HPI  The patient denies anorexia, fever, weight loss, weight gain, vision loss, decreased hearing, hoarseness, chest pain, syncope, dyspnea on exertion, peripheral edema, prolonged cough, headaches, hemoptysis, abdominal pain, melena, hematochezia, severe indigestion/heartburn, hematuria, incontinence, muscle weakness, suspicious skin lesions, transient blindness, difficulty walking, depression, unusual weight change, abnormal bleeding, enlarged lymph nodes, and angioedema.    Vital Signs:  Patient profile:   70 year old  male Height:      66.5 inches Weight:      174.38 pounds BMI:     27.82 O2 Sat:      96 % on Room air Temp:     97.8 degrees F oral Pulse rate:   90 / minute BP sitting:   120 / 82  (right arm) Cuff size:   regular  Vitals Entered By: Randell Loop CMA (August 31, 2010 9:02 AM)  O2 Sat at Rest %:  96 O2 Flow:  Room air CC: 2 year ROV & review of mult medical problems... Is Patient Diabetic? No Pain Assessment Patient in pain? no      Comments meds updated today with pt   Physical Exam  Additional Exam:  WD, WN, 70 y/o BM in NAD... GENERAL:  Alert & oriented; pleasant & cooperative... HEENT:  [place]/AT, EOM-wnl, PERRLA, EACs-clear, TMs-wnl, NOSE-clear, THROAT-clear & wnl. NECK:  Supple w/ full ROM; no JVD; normal carotid impulses w/o bruits; no thyromegaly or nodules palpated; no lymphadenopathy. CHEST:  Clear to P & A; without wheezes/ rales/ or rhonchi. HEART:  Regular Rhythm; without murmurs/ rubs/ or gallops. ABDOMEN:  Soft & nontender; normal  bowel sounds; no organomegaly or masses detected. EXT: without deformities, mild arthritic changes; no varicose veins/ venous insuffic/ or edema. NEURO:  CN's intact; motor testing normal; sensory testing normal; gait normal & balance OK. DERM:  No lesions noted; no rash etc...  MMSE:  Date- 16Apr2010;  Pres= Obama, VP= Biden;  Recall- 3/3 immed and 2/3 after distraction;  Serial7's- [phone];  WORLD backwards= DLORW, then corrected himself;  Proverbs= normal abstractions.    Impression & Recommendations:  Problem # 1:  HYPERTENSION (ICD-401.9) Controlled on Norvasc> continue med... His updated medication list for this problem includes:    Amlodipine Besylate 5 Mg Tabs (Amlodipine besylate) .[name]... Take 1 tablet by mouth once a day  Orders: T-2 View CXR (71020TC) TLB-BMP (Basic Metabolic Panel-BMET) (80048-METABOL) TLB-Hepatic/Liver Function Pnl (80076-HEPATIC) TLB-CBC Platelet - w/Differential (85025-CBCD) TLB-Lipid Panel  (80061-LIPID) TLB-TSH (Thyroid Stimulating Hormone) (84443-TSH)  Problem # 2:  HYPERCHOLESTEROLEMIA (ICD-272.0) FLP looks good on the Simva20>  continue same. His updated medication list for this problem includes:    Simvastatin 20 Mg Tabs (Simvastatin) .[name]... Take 1 tablet by mouth at bedtime...  Problem # 3:  GERD (ICD-530.81) GERD, reflux is improved w/ PPI therapy... His updated medication list for this problem includes:    Prilosec 20 Mg Cpdr (Omeprazole) .[name]... Take one tab daily  Problem # 4:  COLONIC POLYPS (ICD-211.3) He is up to date on colon screening... note: [name] plans f/u 68yrs...  Problem # 5:  BENIGN PROSTATIC HYPERTROPHY, HX OF (ICD-V13.8) Followed by [name] & [name]... on Fisateride & watchfull waiting...  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 LA Bicillin 2.65M IM x 3 doses a week apart...  Problem # 7:  OTHER MEDICAL PROBLEMS AS NOTED>>>  Complete Medication List: 1)  [name] 0.03 % Soln (Bimatoprost) .... Use as directed 2)  Aspirin 81 Mg Tbec (Aspirin) .... Take 1 tablet by mouth once a day 3)  Amlodipine Besylate 5 Mg Tabs (Amlodipine besylate) .... Take 1 tablet by mouth once a day 4)  Simvastatin 20 Mg Tabs (Simvastatin) .... Take 1 tablet by mouth at bedtime.Marland KitchenMarland Kitchen 5)  Prilosec 20 Mg Cpdr (Omeprazole) .... Take one tab daily 6)  Finasteride 5 Mg Tabs (Finasteride) .[name].. 1 by mouth once daily 7)  Viagra 100 Mg Tabs (Sildenafil citrate) .... Use as directed  Patient Instructions: 1)  Today we updated your med list- see below.... 2)  We refilled your meds for 2012... 3)  today we did your follow up CXR & FASTING blood work... please call the "phone tree" in a few days for your lab results.Marland KitchenMarland Kitchen 4)  Discuss the voiding problems & nocturia w/ [name] to see if combination therapy may be helpful to you... 5)  Call for any problems.Marland KitchenMarland Kitchen 6)  Please schedule a follow-up appointment in 1 year, sooner as  needed... Prescriptions: VIAGRA 100 MG TABS (SILDENAFIL CITRATE) use as directed  #10 x prn   Entered and Authorized by:   Michele Mcalpine MD   Signed by:   Michele Mcalpine MD on 08/31/2010   Method used:   Print then Give to Patient   RxID:   [id number] FINASTERIDE 5 MG TABS (FINASTERIDE) 1 by mouth once daily  #90 x 4   Entered and Authorized by:   Michele Mcalpine MD   Signed by:   Michele Mcalpine MD on 08/31/2010   Method used:   Print then Give to Patient   RxID:  [id number] PRILOSEC 20 MG CPDR (OMEPRAZOLE) take one tab daily  #90 x 4   Entered and Authorized by:   Michele Mcalpine MD   Signed by:   Michele Mcalpine MD on 08/31/2010   Method used:   Print then Give to Patient   RxID:   [id number] SIMVASTATIN 20 MG  TABS (SIMVASTATIN) Take 1 tablet by mouth at bedtime...  #90 x 4   Entered and Authorized by:   Michele Mcalpine MD   Signed by:   Michele Mcalpine MD on 08/31/2010   Method used:   Print then Give to Patient   RxID:   [id number] AMLODIPINE BESYLATE 5 MG  TABS (AMLODIPINE BESYLATE) Take 1 tablet by mouth once a day  #90 x 4   Entered and Authorized by:   Michele Mcalpine MD   Signed by:   Michele Mcalpine MD on 08/31/2010   Method used:   Print then Give to Patient   RxID:   [id number]    Immunization History:  Influenza Immunization History:    Influenza:  historical (05/18/2009)

## 2010-11-14 NOTE — Assessment & Plan Note (Signed)
 HEALTHCARE                         GASTROENTEROLOGY OFFICE NOTE   NIKKI, RUSNAK                  MRN:          [id number]  DATE:04/11/2007                            DOB:          May 29, 1941    PROBLEM:  Bloating and constipation.   Alan [name] is a 70 year old African-American male referred through the  courtesy of Dr. Kriste Basque for evaluation.  For the past 2 to 3 weeks he has  been complaining of marked epigastric and abdominal bloating.  He denies  nausea or abdominal pain per se.  At the same time, he has been  complaining of constipation.  He has a history of colon polyps and was  last examined in May of 2007.  He had a limited episode of hiccups as  well.  He takes Zantac twice a day.  He has a history of diverticulosis.   PAST MEDICAL HISTORY:  Pertinent for hypertension and sleep apnea.   FAMILY HISTORY:  Pertinent for father and brother with prostate cancer.   MEDICATIONS:  Prevacid.  Zantac.  Metamucil.  Norvasc.  Zocor.  Requip.  Finasteride.  Cialis.  Amlodipine.  Doxazosin.  Ranitidine.  Droperidol.  Simvastatin.  Terbinafine.   He has no allergies.  He neither smokes nor drinks.  He is married and  works as a Education officer, environmental.   REVIEW OF SYSTEMS:  Positive for excessive urination.   EXAM:  Pulse 88, blood pressure 118/72, weight 182.  HEENT:  EOMI. PERRLA. Sclerae are anicteric.  Conjunctivae are pink.  NECK:  Supple without thyromegaly, adenopathy or carotid bruits.  CHEST:  Clear to auscultation and percussion without adventitious  sounds.  CARDIAC:  Regular rhythm; normal S1 S2.  There are no murmurs, gallops  or rubs.  ABDOMEN:  Slightly distended and tympanitic.  There are no abdominal  masses or organomegaly.  EXTREMITIES:  Full range of motion.  No cyanosis, clubbing or edema.  RECTAL:  Deferred.   IMPRESSION:  1. Abdominal distension.  I suspect this is related to his      constipation.  Constipation is likely  functional.  2. History of colon polyps and diverticulosis.   RECOMMENDATION:  1. MiraLax 1 to 2 times daily until he has a sufficient bowel      movement.  If abdominal distension persists, I would consider plain      film of the abdomen and abdominal ultrasound to rule out ascites.  2. Discontinue Zantac twice a day and change to nightly only.   I carefully instructed Mr. Pickard to contact me if his symptoms do not  subside after regulating his bowels.     Barbette Hair. Arlyce Dice, MD,FACG  Electronically Signed    RDK/MedQ  DD: 04/11/2007  DT: 04/11/2007  Job #: [id number]

## 2010-11-17 NOTE — Procedures (Signed)
NAME:  Alan [name], Alan [name] NO.:  [id number]   MEDICAL RECORD NO.:  [id number]          PATIENT TYPE:  OUT   LOCATION:  SLEEP CENTER                 FACILITY:  [facility]   PHYSICIAN:  Barbaraann Share, MD,FCCPDATE OF BIRTH:  October [birth year] 1940   DATE OF STUDY:  07/17/2006                            NOCTURNAL POLYSOMNOGRAM   REFERRING PHYSICIAN:  Alroy Dust MD   INDICATION FOR STUDY:  Hypersomnia with sleep apnea.   EPWORTH SLEEPINESS SCORE:  15   SLEEP ARCHITECTURE:  Patient had a total sleep time of 394 minutes with  very little slow wave sleep and decreased REM.  Sleep onset latency was  normal, as was REM onset.  Sleep efficiency was decreased at 82 percent.   RESPIRATORY DATA:  The patient was found to have 64 hypopneas and 36  obstructive apneas and 1 central apnea for a respiratory disturbance  index of 16 events per hour.  The events occurred in all body positions  and there was very loud snoring noted throughout.   OXYGEN DATA:  There was O2 desaturation as low as 89 percent with the  patient's obstructive events.   CARDIAC DATA:  No clinically significant cardiac arrhythmias were noted.   MOVEMENT-PARASOMNIA:  The patient was found to have 255 leg jerks with 7  prior resulting in arousal or awakening.   IMPRESSIONS-RECOMMENDATIONS:  1. Mild obstructive sleep apnea/hypopnea syndrome with a respiratory      disturbance index of 16 events per hour and O2 desaturation as low      as 89 percent.  Treatment at this degree of sleep apnea can include      weight loss alone if applicable, upper airway surgery, oral      appliance, and also CPAP.  2. Very large numbers of leg jerks with significant sleep disruption.      Certainly, this may be a part of the patient's sleep disordered      breathing, however the severe nature of the leg jerks is very      suspicious for a concomitant      primary movement disorder of sleep such as  the restless leg      syndrome or  periodic leg movement syndrome.  Clinical correlation      is suggested.      Barbaraann Share, MD,FCCP  Diplomate, American Board of Sleep  Medicine  Electronically Signed     KMC/MEDQ  D:  07/25/2006 17:10:25  T:  07/25/2006 20:00:28  Job:  [id number]

## 2011-04-09 ENCOUNTER — Other Ambulatory Visit: Payer: Self-pay | Admitting: Adult Health

## 2011-07-13 ENCOUNTER — Telehealth: Payer: Self-pay | Admitting: Pulmonary Disease

## 2011-07-13 NOTE — Telephone Encounter (Signed)
Leigh please advise if you have seen any papers on this pt, thanks

## 2011-07-17 NOTE — Telephone Encounter (Signed)
Huntley Dec from [facility] called back re: status of vacuum device as well. She says dr Kriste Basque can just write a rx on his pad with diagnosis code 707.84- please state: VED vacuum device on this. Call same # as given above but x [phone] for sara. Hazel Sams

## 2011-07-17 NOTE — Telephone Encounter (Signed)
SN will call and speak with pt about these papers.

## 2011-09-12 ENCOUNTER — Other Ambulatory Visit: Payer: Self-pay | Admitting: Pulmonary Disease

## 2011-09-16 ENCOUNTER — Other Ambulatory Visit: Payer: Self-pay | Admitting: Pulmonary Disease

## 2011-10-02 ENCOUNTER — Other Ambulatory Visit: Payer: Self-pay | Admitting: Pulmonary Disease

## 2011-11-30 ENCOUNTER — Other Ambulatory Visit: Payer: Self-pay | Admitting: Pulmonary Disease

## 2011-12-26 ENCOUNTER — Telehealth: Payer: Self-pay | Admitting: Pulmonary Disease

## 2011-12-26 NOTE — Telephone Encounter (Signed)
Called and spoke with pt's spouse. She states that pt has had poor appetite and stomach pain x 3 wks. She states that she is not aware of any other symptoms he is having, and she is not home with him now. I advised will call the pt and speak with him for more info.   I called and spoke with pt. He states that he has been having abd pain off and on for 3 wks- he relates this to being constipated. He has started taking miralax and this has relieved his pain. He denied having any nausea, vomiting or diarrhea. He feels better today and states that he is going to see Dr Arlyce Dice on 02/04/12 for further eval. I have scheduled him appt with SN for cpx since he was overdue- appt scheduled for 02-15-12 at 10:30 am. Pt states nothing further needed. I advised that if pain returns he should call GI to see if they can move up his appt or seek emergent care if needed. Pt verbalized understanding.

## 2012-01-15 ENCOUNTER — Other Ambulatory Visit: Payer: Self-pay | Admitting: Pulmonary Disease

## 2012-02-04 ENCOUNTER — Encounter: Payer: Self-pay | Admitting: Gastroenterology

## 2012-02-04 ENCOUNTER — Ambulatory Visit (INDEPENDENT_AMBULATORY_CARE_PROVIDER_SITE_OTHER): Payer: Medicare Other | Admitting: Gastroenterology

## 2012-02-04 VITALS — BP 112/82 | HR 89 | Ht 66.5 in | Wt 176.3 lb

## 2012-02-04 DIAGNOSIS — D126 Benign neoplasm of colon, unspecified: Secondary | ICD-10-CM

## 2012-02-04 DIAGNOSIS — K5909 Other constipation: Secondary | ICD-10-CM

## 2012-02-04 DIAGNOSIS — K219 Gastro-esophageal reflux disease without esophagitis: Secondary | ICD-10-CM

## 2012-02-04 NOTE — Assessment & Plan Note (Signed)
Plan followup colonoscopy 2014 

## 2012-02-04 NOTE — Patient Instructions (Addendum)
We are giving you Linzess samples today Call back in one week if they work for you then we will send in a prescription for you

## 2012-02-04 NOTE — Assessment & Plan Note (Signed)
Well controlled with Prilosec 

## 2012-02-04 NOTE — Assessment & Plan Note (Signed)
Patient has chronic idiopathic constipation that has not responded well to MiraLax.  Recommendations #1 trial of Linzess 145 micrograms daily

## 2012-02-04 NOTE — Progress Notes (Signed)
History of Present Illness:  Alan [name] has returned for evaluation of constipation. Despite taking MiraLax daily he may go  2-3 days without a bowel movement. Several weeks ago he had moderately severe abdominal pain associated with worsening constipation. When he finally moves his bowels his pain entirely subsided. Patient has a history of colon polyps and was last examined 2011.  He has GERD which is well-controlled with Prilosec.    Review of Systems: Pertinent positive and negative review of systems were noted in the above HPI section. All other review of systems were otherwise negative.    Current Medications, Allergies, Past Medical History, Past Surgical History, Family History and Social History were reviewed in [facility] electronic medical record  Vital signs were reviewed in today's medical record. Physical Exam: General: Well developed , well nourished, no acute distress Head: Normocephalic and atraumatic Eyes:  sclerae anicteric, EOMI Ears: Normal auditory acuity Mouth: No deformity or lesions Lungs: Clear throughout to auscultation Heart: Regular rate and rhythm; no murmurs, rubs or bruits Abdomen: Soft, non tender and non distended. No masses, hepatosplenomegaly or hernias noted. Normal Bowel sounds Rectal:deferred Musculoskeletal: Symmetrical with no gross deformities  Pulses:  Normal pulses noted Extremities: No clubbing, cyanosis, edema or deformities noted Neurological: Alert oriented x 4, grossly nonfocal Psychological:  Alert and cooperative. Normal mood and affect

## 2012-02-15 ENCOUNTER — Encounter: Payer: Medicare Other | Admitting: Pulmonary Disease

## 2012-02-19 ENCOUNTER — Other Ambulatory Visit: Payer: Self-pay | Admitting: Gastroenterology

## 2012-02-19 MED ORDER — LINACLOTIDE 145 MCG PO CAPS: 145.0000 ug | ORAL_CAPSULE | Freq: Every day | ORAL | Status: DC

## 2012-02-19 NOTE — Telephone Encounter (Signed)
Called and left message for patient that Linzess was sent in to his pharmacy

## 2012-03-05 ENCOUNTER — Other Ambulatory Visit: Payer: Self-pay | Admitting: Pulmonary Disease

## 2012-04-02 ENCOUNTER — Encounter: Payer: Self-pay | Admitting: *Deleted

## 2012-04-03 ENCOUNTER — Encounter: Payer: Self-pay | Admitting: Pulmonary Disease

## 2012-04-03 ENCOUNTER — Other Ambulatory Visit (INDEPENDENT_AMBULATORY_CARE_PROVIDER_SITE_OTHER): Payer: Medicare Other

## 2012-04-03 ENCOUNTER — Ambulatory Visit (INDEPENDENT_AMBULATORY_CARE_PROVIDER_SITE_OTHER): Payer: Medicare Other | Admitting: Pulmonary Disease

## 2012-04-03 VITALS — BP 118/82 | HR 82 | Temp 98.0°F | Ht 66.0 in | Wt 176.0 lb

## 2012-04-03 DIAGNOSIS — E7800 Pure hypercholesterolemia, unspecified: Secondary | ICD-10-CM

## 2012-04-03 DIAGNOSIS — F411 Generalized anxiety disorder: Secondary | ICD-10-CM

## 2012-04-03 DIAGNOSIS — D126 Benign neoplasm of colon, unspecified: Secondary | ICD-10-CM

## 2012-04-03 DIAGNOSIS — K219 Gastro-esophageal reflux disease without esophagitis: Secondary | ICD-10-CM

## 2012-04-03 DIAGNOSIS — I10 Essential (primary) hypertension: Secondary | ICD-10-CM

## 2012-04-03 DIAGNOSIS — Z23 Encounter for immunization: Secondary | ICD-10-CM

## 2012-04-03 DIAGNOSIS — Z87898 Personal history of other specified conditions: Secondary | ICD-10-CM

## 2012-04-03 DIAGNOSIS — K589 Irritable bowel syndrome without diarrhea: Secondary | ICD-10-CM

## 2012-04-03 DIAGNOSIS — G2581 Restless legs syndrome: Secondary | ICD-10-CM

## 2012-04-03 DIAGNOSIS — K5730 Diverticulosis of large intestine without perforation or abscess without bleeding: Secondary | ICD-10-CM

## 2012-04-03 DIAGNOSIS — K5909 Other constipation: Secondary | ICD-10-CM

## 2012-04-03 LAB — HEPATIC FUNCTION PANEL
AST: 15 U/L (ref 0–37)
Alkaline Phosphatase: 42 U/L (ref 39–117)
Bilirubin, Direct: 0.1 mg/dL (ref 0.0–0.3)
Total Bilirubin: 0.8 mg/dL (ref 0.3–1.2)

## 2012-04-03 LAB — CBC WITH DIFFERENTIAL/PLATELET
Basophils Absolute: 0 10*3/uL (ref 0.0–0.1)
Eosinophils Absolute: 0.2 10*3/uL (ref 0.0–0.7)
HCT: 43.2 % (ref 39.0–52.0)
Hemoglobin: 14.2 g/dL (ref 13.0–17.0)
Lymphs Abs: 1.7 10*3/uL (ref 0.7–4.0)
MCHC: 32.8 g/dL (ref 30.0–36.0)
MCV: 88.6 fl (ref 78.0–100.0)
Monocytes Absolute: 0.5 10*3/uL (ref 0.1–1.0)
Neutro Abs: 2.3 10*3/uL (ref 1.4–7.7)
Neutrophils Relative %: 48.8 % (ref 43.0–77.0)
Platelets: 247 10*3/uL (ref 150.0–400.0)
RDW: 14.8 % — ABNORMAL HIGH (ref 11.5–14.6)
WBC: 4.7 10*3/uL (ref 4.5–10.5)

## 2012-04-03 LAB — LIPID PANEL
LDL Cholesterol: 88 mg/dL (ref 0–99)
Total CHOL/HDL Ratio: 3
Triglycerides: 68 mg/dL (ref 0.0–149.0)
VLDL: 13.6 mg/dL (ref 0.0–40.0)

## 2012-04-03 LAB — BASIC METABOLIC PANEL
BUN: 17 mg/dL (ref 6–23)
Calcium: 8.9 mg/dL (ref 8.4–10.5)
Creatinine, Ser: 1.2 mg/dL (ref 0.4–1.5)
GFR: 78.88 mL/min (ref >60.00)
Glucose, Bld: 108 mg/dL — ABNORMAL HIGH (ref 70–99)
Potassium: 3.9 mEq/L (ref 3.5–5.1)
Sodium: 139 mEq/L (ref 135–145)

## 2012-04-03 LAB — TSH: TSH: 0.34 u[IU]/mL — ABNORMAL LOW (ref 0.35–5.50)

## 2012-04-03 MED ORDER — AZITHROMYCIN 250 MG PO TABS: ORAL_TABLET | ORAL | Status: DC

## 2012-04-03 MED ORDER — OMEPRAZOLE 20 MG PO CPDR: 20.0000 mg | DELAYED_RELEASE_CAPSULE | Freq: Every day | ORAL | Status: DC

## 2012-04-03 MED ORDER — AMLODIPINE BESYLATE 5 MG PO TABS: 5.0000 mg | ORAL_TABLET | Freq: Every day | ORAL | Status: DC

## 2012-04-03 MED ORDER — SIMVASTATIN 20 MG PO TABS: 20.0000 mg | ORAL_TABLET | Freq: Every day | ORAL | Status: DC

## 2012-04-03 MED ORDER — FINASTERIDE 5 MG PO TABS: 5.0000 mg | ORAL_TABLET | Freq: Every day | ORAL | Status: DC

## 2012-04-03 NOTE — Progress Notes (Addendum)
Subjective:     Patient ID: Alan [name], male   DOB: 01/03/1941, 71 y.o.   MRN: [id number]  HPI 71 y/o BM here for a follow up visit... he has mult med problems as noted below...   ~  August 31, 2010:  2 year ROV & review> requests 90d refills today...    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    He had GI eval from [name] 1/11 w/ EGD showing mult GE erosions & duodenitis- treated w/ PPI Prilosec & improved;  colonoscopy at the same time showed several polyps- benign mucosa w/o adenomatous change seen...    He saw Urology at [name] 6/11 [name]> enlarged prostate & median lobe, w/ low vol- hi press detrusor contraction, they rec surg for his bladder outlet obstruction but he declined & opted for FISASTERIDE 5mg /d & improved he says;  he tells me he had his PSA checked at [name] & it was "OK"...    He had bronchitic infection 11/11 & seen at [facility] w/ Levaquin Rx;  also had sharp CWP from the cough, slowly improved over time...    Finally, he was dx w/ Glaucoma on [name] gtts per [name] & may need laser surg...  ~  April 03, 2012:  55mo ROV & Endi is c/o a recent URI w/ dry cough, congestion, some drainage, but no f/c/s/ SOB/ CP/ etc; rec to treat w/ ZPak, cough drops, Mucinex, fluids, Tylenol, etc...     HBP>  On Norvasc5; BP= 118/82 & he denies CP, palpit, SOB, edema, etc...    CHOL>  On Simva20 + diet; FLP showed TChol 146, TG 68, HDL 45, LDL 88; continue same med & diet...    GERD>  On Prilosec20; he denied abd pain, n/v, dysphagia, etc...    Divertics, IBS w/ constip, Colon Polyps>  On Linzess145 per [name] & this really helps; up to date on colon etc...    BPH>  On [name]5 & Flomax0.4; followed by [name] w/ rec  for TURP & 2nd opinion at [name] but he has declined surg...    RLS>  Not on meds his symptoms are not that bad & he is managing well "I deal w/ it- walking helps"...    Anxiety>  Not on meds and states he is doing well overall... We reviewed prob list, meds, xrays and labs> see below for updates >> OK 2013 flu vaccine today & refills of meds... LABS 10/13:  FLP- at goals on Simva20;  Chems- wnl x BS=108;  CBC- wnl;  TSH=0.34 & we will recheck w/ FreeT3 & FreeT4...  ADDENDUM>> f/u TFTs 10/13 showed normal TSH=0.93 & normal FreeT3 & FreeT4...   Problem List:   OBSTRUCTIVE SLEEP APNEA (ICD-327.23) - s/p sleep study 1/08 showing RDI=16, and desat to 89%... he had 4+leg jerks and given trial Requip (?helped)... states he rests well and wakes feeling refreshed most of the time "I live w/ it, it's not that bad"...  HYPERTENSION (ICD-401.9) - controlled on NORVASC 5mg /d, and prev on Cardura but this was switched by [name]... BP=118/82, tol meds well... denies HA, fatigue, visual changes, CP, palipit, dizziness, syncope, dyspnea, edema, etc... he had a Cardiolite scan in 1997  that was neg/ normal w/ EF=75%...  HYPERCHOLESTEROLEMIA (ICD-272.0) - on SIMVASTATIN 20mg /d + diet...  ~  FLP 3/08 showed TChol 157, TG 53, HDL 53, LDL 94 ~  FLP 3/09 showed TChol 124, TG 33, HDL 44, LDL 74 ~  FLP 4/10 showed TChol 123, TG 36, HDL 41, LDL 75 ~  FLP 3/12 showed TChol 148, TG 47, HDL 49, LDL 89... continue Simva20. ~  FLP 10/13 on Simva20 showed TChol 146, TG 68, HDL 45, LDL 88... Continue same.  GERD (ICD-530.81) - prev rec to take Prevacid 30mg  & Zantac 150Qhs... GI eval 1/11 by Dorris Singh w/ rec to take PRILOSEC daily ~  EGD 5/02 showing esophagitis and gastritis... ~  EGD 1/11 showed GE junction erosions & duodenitis... Rx Prilosec & improved symptomatically.  DIVERTICULOSIS OF COLON (ICD-562.10) - on Metamucil daily...  IRRITABLE BOWEL SYNDROME w/ CONSTIPATION > on LINZESS 145mg /d per GI... COLONIC  POLYPS (ICD-211.3) ~  colonoscopy 5/07 by DrSam showing divertics and 3-69mm adenomatous polyps removed... f/u planned 6yrs... ~  colonoscopy 1/11 w/ several pedunculated polyps & path was neg- benign colonic mucosa w/o adenomatous change.  BENIGN PROSTATIC HYPERTROPHY, HX OF (ICD-V13.8) - seen by DrHumphries previously... now seeing [name] but pt doesn't feel that he is doing as well on the [facility] 0.4mg /d (prev on [name] + Cardura)... I've encouraged him to discuss this w/ [name]... he sought 2nd opinion at [name], [name]- enlarged prostate & median lobe, w/ low vol- hi press detrusor contraction, they rec surg for his bladder outlet obstruction but he declined & opted for FISASTERIDE 5mg /d & improved he says...  ERECTILE DYSFUNCTION (ICD-302.72) - Tried all 3 PDE4 inhibitors, but not much benefit... consider shots.  ~  labs in 2010 at Urology office was 1.47 per patient. ~  2001: he tells me he had his PSA checked at [name] & it was "OK"...  RESTLESS LEG SYNDROME (ICD-333.94) - prev on Requip 1mg Qhs, but stopped this med due to stomach side effects ... he notes no diff w/ resting- good nights, and bad nights...   ANXIETY (ICD-300.00) - he does not desire anxiolytic Rx...  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   Past Medical History  Diagnosis Date  . Sleep apnea   . Unspecified essential hypertension   . Hypercholesteremia   . GERD (gastroesophageal reflux disease)   . Diverticulosis of colon (without mention of hemorrhage)   . Irritable bowel syndrome   . Benign neoplasm of colon     colon polyps  . Benign hypertrophy of prostate   . Erectile dysfunction   . Restless legs syndrome (RLS)   . Anxiety state, unspecified   . Onychomycosis     Past Surgical History  Procedure Date  . None     Outpatient Encounter Prescriptions as of 04/03/2012  Medication Sig Dispense Refill  . amLODipine (NORVASC) 5 MG tablet Take 1 tablet (5  mg total) by mouth daily.  90 tablet  3  . aspirin 81 MG tablet Take 81 mg by mouth daily.      . finasteride ([name]) 5 MG tablet Take 1 tablet (5 mg total) by mouth daily.  90 tablet  3  . Linaclotide (LINZESS) 145 MCG CAPS Take 1 capsule (145 mcg total) by mouth daily.  30 capsule  6  . omeprazole (PRILOSEC) 20 MG capsule Take 1 capsule (20 mg total) by mouth daily.  90 capsule  3  . simvastatin (ZOCOR) 20 MG tablet Take 1 tablet (  20 mg total) by mouth at bedtime.  90 tablet  3  . Tamsulosin HCl (FLOMAX) 0.4 MG CAPS Take 0.4 mg by mouth daily after supper.       [name] DISCONTD: amLODipine (NORVASC) 5 MG tablet TAKE ONE TABLET BY MOUTH EVERY DAY  90 tablet  3  . DISCONTD: finasteride ([name]) 5 MG tablet TAKE ONE TABLET BY MOUTH EVERY DAY  90 tablet  0  . DISCONTD: omeprazole (PRILOSEC) 20 MG capsule TAKE ONE CAPSULE BY MOUTH EVERY DAY  90 capsule  3  . DISCONTD: simvastatin (ZOCOR) 20 MG tablet TAKE ONE TABLET BY MOUTH EVERY DAY AT BEDTIME  90 tablet  0  . azithromycin (ZITHROMAX) 250 MG tablet Take as directed  6 each  2    Allergies  Allergen Reactions  . Codeine     REACTION: hallucinations    Current Medications, Allergies, Past Medical History, Past Surgical History, Family History, and Social History were reviewed in [employer] record.   Review of Systems        See HPI - all other systems neg except as noted... The patient denies anorexia, fever, weight loss, weight gain, vision loss, decreased hearing, hoarseness, chest pain, syncope, dyspnea on exertion, peripheral edema, prolonged cough, headaches, hemoptysis, abdominal pain, melena, hematochezia, severe indigestion/heartburn, hematuria, incontinence, muscle weakness, suspicious skin lesions, transient blindness, difficulty walking, depression, unusual weight change, abnormal bleeding, enlarged lymph nodes, and angioedema.     Objective:   Physical Exam    WD, WN, 71 y/o BM in NAD... GENERAL:  Alert  & oriented; pleasant & cooperative... HEENT:  [place]/AT, EOM-wnl, PERRLA, EACs-clear, TMs-wnl, NOSE-clear, THROAT-clear & wnl. NECK:  Supple w/ full ROM; no JVD; normal carotid impulses w/o bruits; no thyromegaly or nodules palpated; no lymphadenopathy. CHEST:  Clear to P & A; without wheezes/ rales/ or rhonchi. HEART:  Regular Rhythm; without murmurs/ rubs/ or gallops. ABDOMEN:  Soft & nontender; normal bowel sounds; no organomegaly or masses detected. EXT: without deformities, mild arthritic changes; no varicose veins/ venous insuffic/ or edema. NEURO:  CN's intact; motor testing normal; sensory testing normal; gait normal & balance OK. DERM:  No lesions noted; no rash etc...  MMSE:  Date- 16Apr2010;  Pres= Obama, VP= Biden;  Recall- 3/3 immed and 2/3 after distraction;  Serial7's- [phone];  WORLD backwards= DLORW, then corrected himself;  Proverbs= normal abstractions.  RADIOLOGY DATA:  Reviewed in the EPIC EMR & discussed w/ the patient...  LABORATORY DATA:  Reviewed in the EPIC EMR & discussed w/ the patient...   Assessment:      HBP>  Well controlled on Amlod5; continue same + diet & low sodium...  CHOL>  Well regulated on Simva20; continue same...  GERD>  Stable on Prilosec20; continue same Rx...  Divertics, IBS, Colon Polyps>  He notes improvement in his chr constipation on Linzess 145...  BPH>  Followed by [name] on Flomax & [name]; he has declined TURP surg...  RLS>  States it's not that bad & not requiring meds...  Anxiety>  Similarly, he notes not that bad & not on meds...     Plan:     Patient's Medications  New Prescriptions   AZITHROMYCIN (ZITHROMAX) 250 MG TABLET    Take as directed  Previous Medications   ASPIRIN 81 MG TABLET    Take 81 mg by mouth daily.   LINACLOTIDE (LINZESS) 145 MCG CAPS    Take 1 capsule (145 mcg total) by mouth daily.   TAMSULOSIN HCL (FLOMAX)  0.4 MG CAPS    Take 0.4 mg by mouth daily after supper.   Modified Medications    Modified Medication Previous Medication   AMLODIPINE (NORVASC) 5 MG TABLET amLODipine (NORVASC) 5 MG tablet      Take 1 tablet (5 mg total) by mouth daily.    TAKE ONE TABLET BY MOUTH EVERY DAY   FINASTERIDE ([name]) 5 MG TABLET finasteride ([name]) 5 MG tablet      Take 1 tablet (5 mg total) by mouth daily.    TAKE ONE TABLET BY MOUTH EVERY DAY   OMEPRAZOLE (PRILOSEC) 20 MG CAPSULE omeprazole (PRILOSEC) 20 MG capsule      Take 1 capsule (20 mg total) by mouth daily.    TAKE ONE CAPSULE BY MOUTH EVERY DAY   SIMVASTATIN (ZOCOR) 20 MG TABLET simvastatin (ZOCOR) 20 MG tablet      Take 1 tablet (20 mg total) by mouth at bedtime.    TAKE ONE TABLET BY MOUTH EVERY DAY AT BEDTIME  Discontinued Medications   No medications on file

## 2012-04-03 NOTE — Patient Instructions (Addendum)
Today we updated your med list in our EPIC system...    Continue your current medications the same...    We refilled your meds as requested...  We added a ZPak to use as needed for upper resp infections...  Today we did your follw up FASTING blood work...    Set up your "my chart" portal & we'll get your results to you via computer...  Call for any questions or if we can be of service in any way...  Let's continue our yearly follow up visits.Marland KitchenMarland Kitchen

## 2012-04-09 ENCOUNTER — Other Ambulatory Visit: Payer: Self-pay | Admitting: Pulmonary Disease

## 2012-04-09 DIAGNOSIS — E0590 Thyrotoxicosis, unspecified without thyrotoxic crisis or storm: Secondary | ICD-10-CM

## 2012-04-24 ENCOUNTER — Other Ambulatory Visit (INDEPENDENT_AMBULATORY_CARE_PROVIDER_SITE_OTHER): Payer: Medicare Other

## 2012-04-24 DIAGNOSIS — E0590 Thyrotoxicosis, unspecified without thyrotoxic crisis or storm: Secondary | ICD-10-CM

## 2012-04-25 LAB — T4, FREE: Free T4: 0.9 ng/dL (ref 0.60–1.60)

## 2012-06-02 ENCOUNTER — Telehealth: Payer: Self-pay | Admitting: Gastroenterology

## 2012-06-02 NOTE — Telephone Encounter (Signed)
Left message for pt to call back  °

## 2012-06-05 NOTE — Telephone Encounter (Signed)
Left message for pt to call back  °

## 2012-06-06 NOTE — Telephone Encounter (Signed)
Left message for pt to call back. Have been unable to reach pt after multiple attempts.

## 2012-07-10 ENCOUNTER — Encounter: Payer: Self-pay | Admitting: Gastroenterology

## 2012-07-16 ENCOUNTER — Encounter: Payer: Self-pay | Admitting: Gastroenterology

## 2012-07-18 ENCOUNTER — Telehealth: Payer: Self-pay | Admitting: Pulmonary Disease

## 2012-07-18 MED ORDER — TADALAFIL 20 MG PO TABS: ORAL_TABLET | ORAL | Status: DC

## 2012-07-18 NOTE — Telephone Encounter (Signed)
Per SN---ok to call in cialis 20 mg  #10  Take as directed and refill prn.  thanks

## 2012-07-18 NOTE — Telephone Encounter (Signed)
Pt aware of SN recs. i have sent RX to the pharmacy. Nothing further was needed

## 2012-07-18 NOTE — Telephone Encounter (Signed)
I spoke with pt. He is requesting RX for cialis. He stated he is having "problems". He had this back in 09'-10'. Please advise SN thanks Last OV 04/03/12 told to f/u in 1 year No pending OV

## 2012-08-11 ENCOUNTER — Ambulatory Visit (AMBULATORY_SURGERY_CENTER): Payer: Medicare Other | Admitting: *Deleted

## 2012-08-11 VITALS — Ht 66.0 in | Wt 176.4 lb

## 2012-08-11 DIAGNOSIS — Z86010 Personal history of colonic polyps: Secondary | ICD-10-CM

## 2012-08-11 DIAGNOSIS — Z1211 Encounter for screening for malignant neoplasm of colon: Secondary | ICD-10-CM

## 2012-08-11 MED ORDER — NA SULFATE-K SULFATE-MG SULF 17.5-3.13-1.6 GM/177ML PO SOLN: ORAL | Status: DC

## 2012-08-26 ENCOUNTER — Ambulatory Visit (AMBULATORY_SURGERY_CENTER): Payer: Medicare Other | Admitting: Gastroenterology

## 2012-08-26 ENCOUNTER — Encounter: Payer: Self-pay | Admitting: Gastroenterology

## 2012-08-26 VITALS — BP 139/92 | HR 78 | Temp 96.8°F | Resp 20 | Ht 66.0 in | Wt 176.0 lb

## 2012-08-26 DIAGNOSIS — Z86010 Personal history of colonic polyps: Secondary | ICD-10-CM

## 2012-08-26 MED ORDER — SODIUM CHLORIDE 0.9 % IV SOLN: 500.0000 mL | INTRAVENOUS | Status: DC

## 2012-08-26 NOTE — Op Note (Signed)
[facility] [address] [place], [address]   COLONOSCOPY PROCEDURE REPORT  PATIENT: Alan [name], Alan [name]  MR#: [id number] BIRTHDATE: June 28, 1941 , 72  yrs. old GENDER: Male ENDOSCOPIST: Louis Meckel, MD REFERRED BY: PROCEDURE DATE:  08/26/2012 PROCEDURE:   Colonoscopy, diagnostic ASA CLASS:   Class II INDICATIONS:Patient's personal history of colon polyps-multiple colon polyps 2011 MEDICATIONS: MAC sedation, administered by CRNA and propofol (Diprivan) 100mg  IV  DESCRIPTION OF PROCEDURE:   After the risks benefits and alternatives of the procedure were thoroughly explained, informed consent was obtained.  A digital rectal exam revealed no abnormalities of the rectum.   The LB [id number]  endoscope was introduced through the anus and advanced to the cecum, which was identified by both the appendix and ileocecal valve. No adverse events experienced.   The quality of the prep was Suprep excellent The instrument was then slowly withdrawn as the colon was fully examined.      COLON FINDINGS: Internal hemorrhoids were found.   The colon mucosa was otherwise normal.  Retroflexed views revealed no abnormalities. The time to cecum=2 minutes 30 seconds.  Withdrawal time=6 minutes 24 seconds.  The scope was withdrawn and the procedure completed. COMPLICATIONS: There were no complications.  ENDOSCOPIC IMPRESSION: 1.   Internal hemorrhoids 2.   The colon mucosa was otherwise normal  RECOMMENDATIONS: Colonoscopy 7 years   eSigned:  Louis Meckel, MD 08/26/2012 9:05 AM   cc: Michele Mcalpine, MD

## 2012-08-26 NOTE — Progress Notes (Signed)
Lidocaine-40mg IV prior to Propofol InductionPropofol given over incremental dosages 

## 2012-08-26 NOTE — Progress Notes (Addendum)
Patient did not have preoperative order for IV antibiotic SSI prophylaxis. (G8918)  Patient did not experience any of the following events: a burn prior to discharge; a fall within the facility; wrong site/side/patient/procedure/implant event; or a hospital transfer or hospital admission upon discharge from the facility. (G8907)  

## 2012-08-26 NOTE — Patient Instructions (Addendum)

## 2012-08-27 ENCOUNTER — Telehealth: Payer: Self-pay

## 2012-08-27 NOTE — Telephone Encounter (Signed)
  Follow up Call-  Call back number 08/26/2012  Post procedure Call Back phone  # [phone]  Permission to leave phone message Yes     Patient questions:  Do you have a fever, pain , or abdominal swelling? no Pain Score  0 *  Have you tolerated food without any problems? yes  Have you been able to return to your normal activities? yes  Do you have any questions about your discharge instructions: Diet   no Medications  no Follow up visit  no  Do you have questions or concerns about your Care? no  Actions: * If pain score is 4 or above: No action needed, pain <4.

## 2012-09-18 ENCOUNTER — Telehealth: Payer: Self-pay | Admitting: Pulmonary Disease

## 2012-09-18 NOTE — Telephone Encounter (Signed)
Called [employer]---this form was faxed back in [place].  Form has been printed off again for SN to sign and we will fax back once completed.  Nothing further is needed.

## 2012-10-25 ENCOUNTER — Ambulatory Visit (INDEPENDENT_AMBULATORY_CARE_PROVIDER_SITE_OTHER): Payer: Medicare Other | Admitting: Family Medicine

## 2012-10-25 ENCOUNTER — Ambulatory Visit: Payer: Medicare Other

## 2012-10-25 VITALS — BP 116/78 | HR 74 | Temp 97.4°F | Resp 18 | Ht 65.5 in | Wt 172.0 lb

## 2012-10-25 DIAGNOSIS — K5900 Constipation, unspecified: Secondary | ICD-10-CM

## 2012-10-25 DIAGNOSIS — R1084 Generalized abdominal pain: Secondary | ICD-10-CM

## 2012-10-25 DIAGNOSIS — D649 Anemia, unspecified: Secondary | ICD-10-CM

## 2012-10-25 LAB — POCT CBC
Granulocyte percent: 64.3 %G (ref 37–80)
HCT, POC: 42.4 % — AB (ref 43.5–53.7)
Hemoglobin: 13.4 g/dL — AB (ref 14.1–18.1)
MCV: 88.9 fL (ref 80–97)
POC LYMPH PERCENT: 27.1 %L (ref 10–50)
RDW, POC: 15.1 %

## 2012-10-25 MED ORDER — LACTULOSE 10 GM/15ML PO SOLN: 10.0000 g | Freq: Two times a day (BID) | ORAL | Status: DC

## 2012-10-25 NOTE — Progress Notes (Signed)
Subjective:    Patient ID: Alan [name], male    DOB: 1940/08/07, 72 y.o.   MRN: [id number]  HPI Alan [name] is a 72 y.o. male PCP: Kriste Basque GI: Arlyce Dice  Hx of GERD, chronic constipation with eval by GI prior - recommended Linzess in October 2013 as non responsive to Miralax, and by chart IBS, colon polyps, and diverticulosis.  08/26/12 colonoscopy:  IMPRESSION:  1. Normal sonographic appearance of the gallbladder and normal  caliber common bile duct.  2. Incomplete visualization of the pancreas. Otherwise, normal  examination.  Today complains of abd pain, ache, starting yesterday - midday.  Last BM 4 days ago. No improvement with miralax once 3 days ago, once 2 days ago, and 2-3 times yesterday, tried glycerin suppository x 2.  Has been taking Linzess since February. Abdomen feels swollen past few days, no fever, no vomiting. Few small hard stools only - last night.  appt pending with Dr. Arlyce Dice - possible Monday or Tuesday.  Up all night due to soreness.   Review of Systems  Respiratory: Negative for chest tightness and shortness of breath.   Cardiovascular: Negative for chest pain.  Gastrointestinal: Positive for abdominal pain and constipation. Negative for nausea, vomiting, diarrhea, blood in stool and anal bleeding.  Genitourinary: Negative for dysuria, hematuria, decreased urine volume and difficulty urinating.       Hx of BPH, but no recent change in symptoms.        Objective:   Physical Exam  Vitals reviewed. Constitutional: He is oriented to person, place, and time. He appears well-developed and well-nourished.  HENT:  Head: Normocephalic and atraumatic.  Eyes: EOM are normal. Pupils are equal, round, and reactive to light.  Neck: No JVD present. Carotid bruit is not present.  Cardiovascular: Normal rate, regular rhythm and normal heart sounds.   No murmur heard. Pulmonary/Chest: Effort normal and breath sounds normal. He has no rales.  Abdominal:  Soft. He exhibits no distension (full, appearance without apparent distension. ). Bowel sounds are increased. There is tenderness (diffuse, but localizes more soreness to RLQ/RUQ than LLQ.  no suprapubic ttp. ) in the right upper quadrant, right lower quadrant and left lower quadrant. There is no rigidity, no rebound, no guarding and no CVA tenderness. No hernia. Hernia confirmed negative in the ventral area.  Musculoskeletal: He exhibits no edema.  Neurological: He is alert and oriented to person, place, and time.  Skin: Skin is warm and dry. No rash noted.  Psychiatric: He has a normal mood and affect. His behavior is normal.   [facility] reading (PRIMARY) by  Dr. Neva Seat: acute abd series: large amount of stool - R sided abdomen.  Results for orders placed in visit on 10/25/12  POCT CBC      Result Value Range   WBC 6.5  4.6 - 10.2 K/uL   Lymph, poc 1.8  0.6 - 3.4   POC LYMPH PERCENT 27.1  10 - 50 %L   MID (cbc) 0.6  0 - 0.9   POC MID % 8.6  0 - 12 %M   POC Granulocyte 4.2  2 - 6.9   Granulocyte percent 64.3  37 - 80 %G   RBC 4.77  4.69 - 6.13 M/uL   Hemoglobin 13.4 (*) 14.1 - 18.1 g/dL   HCT, POC 44.0 (*) 10.2 - 53.7 %   MCV 88.9  80 - 97 fL   MCH, POC 28.1  27 - 31.2 pg   MCHC 31.6 (*)  31.8 - 35.4 g/dL   RDW, POC 45.4     Platelet Count, POC 278  142 - 424 K/uL   MPV 8.0  0 - 99.8 fL      Assessment & Plan:  Alan [name] is a 72 y.o. male Unspecified constipation - Plan: DG Abd Acute W/Chest, CANCELED: DG Abd 2 Views  Generalized abdominal pain - Plan: DG Abd Acute W/Chest, POCT CBC, CANCELED: DG Abd 2 Views  Large stool burden R sided abdomen. No rectal or L sided burden noted. Will try lactulose 15mg  BID in addition to miralax at night if no BM, and follow up with GI in 2 days.  RTC sooner or to ER if worse.  If not improving with above by tomorrow - may need to try Golytely.   Anemia - borderline - can follow up with GI and primary provider. Recent colonoscopy results  noted.  rtc precautions given.   Meds ordered this encounter  Medications  . lactulose (CHRONULAC) 10 GM/15ML solution    Sig: Take 15 mLs (10 g total) by mouth 2 (two) times daily. Until bowel movement.  Stop if diarrhea.    Dispense:  240 mL    Refill:  0   Patient Instructions  Start the lactulose - twice per day until bowel movement, and miralax at night if needed. Follow up wit your gastroenterologist Monday. Return to the clinic or go to the nearest emergency room if any of your symptoms worsen or new symptoms occur. Constipation, Adult Constipation is when a person has fewer than 3 bowel movements a week; has difficulty having a bowel movement; or has stools that are dry, hard, or larger than normal. As people grow older, constipation is more common. If you try to fix constipation with medicines that make you have a bowel movement (laxatives), the problem may get worse. Long-term laxative use may cause the muscles of the colon to become weak. A low-fiber diet, not taking in enough fluids, and taking certain medicines may make constipation worse. CAUSES   Certain medicines, such as antidepressants, pain medicine, iron supplements, antacids, and water pills.   Certain diseases, such as diabetes, irritable bowel syndrome (IBS), thyroid disease, or depression.   Not drinking enough water.   Not eating enough fiber-rich foods.   Stress or travel.  Lack of physical activity or exercise.  Not going to the restroom when there is the urge to have a bowel movement.  Ignoring the urge to have a bowel movement.  Using laxatives too much. SYMPTOMS   Having fewer than 3 bowel movements a week.   Straining to have a bowel movement.   Having hard, dry, or larger than normal stools.   Feeling full or bloated.   Pain in the lower abdomen.  Not feeling relief after having a bowel movement. DIAGNOSIS  Your caregiver will take a medical history and perform a physical exam.  Further testing may be done for severe constipation. Some tests may include:   A barium enema X-ray to examine your rectum, colon, and sometimes, your small intestine.  A sigmoidoscopy to examine your lower colon.  A colonoscopy to examine your entire colon. TREATMENT  Treatment will depend on the severity of your constipation and what is causing it. Some dietary treatments include drinking more fluids and eating more fiber-rich foods. Lifestyle treatments may include regular exercise. If these diet and lifestyle recommendations do not help, your caregiver may recommend taking over-the-counter laxative medicines to help you have bowel  movements. Prescription medicines may be prescribed if over-the-counter medicines do not work.  HOME CARE INSTRUCTIONS   Increase dietary fiber in your diet, such as fruits, vegetables, whole grains, and beans. Limit high-fat and processed sugars in your diet, such as [place] fries, hamburgers, cookies, candies, and soda.   A fiber supplement may be added to your diet if you cannot get enough fiber from foods.   Drink enough fluids to keep your urine clear or pale yellow.   Exercise regularly or as directed by your caregiver.   Go to the restroom when you have the urge to go. Do not hold it.  Only take medicines as directed by your caregiver. Do not take other medicines for constipation without talking to your caregiver first. SEEK IMMEDIATE MEDICAL CARE IF:   You have bright red blood in your stool.   Your constipation lasts for more than 4 days or gets worse.   You have abdominal or rectal pain.   You have thin, pencil-like stools.  You have unexplained weight loss. MAKE SURE YOU:   Understand these instructions.  Will watch your condition.  Will get help right away if you are not doing well or get worse. Document Released: 03/16/2004 Document Revised: 09/10/2011 Document Reviewed: 05/22/2011 [facility] Patient Information 2013 [place],  [place].

## 2012-10-25 NOTE — Patient Instructions (Signed)
Start the lactulose - twice per day until bowel movement, and miralax at night if needed. Follow up wit your gastroenterologist Monday. Return to the clinic or go to the nearest emergency room if any of your symptoms worsen or new symptoms occur. Constipation, Adult Constipation is when a person has fewer than 3 bowel movements a week; has difficulty having a bowel movement; or has stools that are dry, hard, or larger than normal. As people grow older, constipation is more common. If you try to fix constipation with medicines that make you have a bowel movement (laxatives), the problem may get worse. Long-term laxative use may cause the muscles of the colon to become weak. A low-fiber diet, not taking in enough fluids, and taking certain medicines may make constipation worse. CAUSES   Certain medicines, such as antidepressants, pain medicine, iron supplements, antacids, and water pills.   Certain diseases, such as diabetes, irritable bowel syndrome (IBS), thyroid disease, or depression.   Not drinking enough water.   Not eating enough fiber-rich foods.   Stress or travel.  Lack of physical activity or exercise.  Not going to the restroom when there is the urge to have a bowel movement.  Ignoring the urge to have a bowel movement.  Using laxatives too much. SYMPTOMS   Having fewer than 3 bowel movements a week.   Straining to have a bowel movement.   Having hard, dry, or larger than normal stools.   Feeling full or bloated.   Pain in the lower abdomen.  Not feeling relief after having a bowel movement. DIAGNOSIS  Your caregiver will take a medical history and perform a physical exam. Further testing may be done for severe constipation. Some tests may include:   A barium enema X-ray to examine your rectum, colon, and sometimes, your small intestine.  A sigmoidoscopy to examine your lower colon.  A colonoscopy to examine your entire colon. TREATMENT  Treatment will  depend on the severity of your constipation and what is causing it. Some dietary treatments include drinking more fluids and eating more fiber-rich foods. Lifestyle treatments may include regular exercise. If these diet and lifestyle recommendations do not help, your caregiver may recommend taking over-the-counter laxative medicines to help you have bowel movements. Prescription medicines may be prescribed if over-the-counter medicines do not work.  HOME CARE INSTRUCTIONS   Increase dietary fiber in your diet, such as fruits, vegetables, whole grains, and beans. Limit high-fat and processed sugars in your diet, such as [place] fries, hamburgers, cookies, candies, and soda.   A fiber supplement may be added to your diet if you cannot get enough fiber from foods.   Drink enough fluids to keep your urine clear or pale yellow.   Exercise regularly or as directed by your caregiver.   Go to the restroom when you have the urge to go. Do not hold it.  Only take medicines as directed by your caregiver. Do not take other medicines for constipation without talking to your caregiver first. SEEK IMMEDIATE MEDICAL CARE IF:   You have bright red blood in your stool.   Your constipation lasts for more than 4 days or gets worse.   You have abdominal or rectal pain.   You have thin, pencil-like stools.  You have unexplained weight loss. MAKE SURE YOU:   Understand these instructions.  Will watch your condition.  Will get help right away if you are not doing well or get worse. Document Released: 03/16/2004 Document Revised: 09/10/2011 Document Reviewed:  05/22/2011 Exit[employer] Patient Information 2013 [place], [place].

## 2012-10-27 ENCOUNTER — Encounter: Payer: Self-pay | Admitting: Adult Health

## 2012-10-27 ENCOUNTER — Ambulatory Visit (INDEPENDENT_AMBULATORY_CARE_PROVIDER_SITE_OTHER): Payer: Medicare Other | Admitting: Adult Health

## 2012-10-27 VITALS — BP 124/84 | HR 84 | Temp 97.8°F | Ht 67.0 in | Wt 173.8 lb

## 2012-10-27 DIAGNOSIS — K5909 Other constipation: Secondary | ICD-10-CM

## 2012-10-27 NOTE — Patient Instructions (Addendum)
Begin Stool softner colace 100mg  Twice daily   Begin Miralax At bedtime   Continue on Lactulose Twice daily  As needed  Constipation .  Drink plenty of fluids -such as water.  Can try prune juice daily .  Gas- x with meals.  Begin ProBiotic -Align daily .  If still constipated , can use Enema x 1 .  follow up Dr. Kriste Basque  As planned and As needed   Please contact office for sooner follow up if symptoms do not improve or worsen or seek emergency care - If not improving will need referral to Dr. Arlyce Dice in GI.

## 2012-10-27 NOTE — Assessment & Plan Note (Signed)
Persistent constipation  ABD film w/ out obstruction   Plan  Begin Stool softner colace 100mg  Twice daily   Begin Miralax At bedtime   Continue on Lactulose Twice daily  As needed  Constipation .  Drink plenty of fluids -such as water.  Can try prune juice daily .  Gas- x with meals.  Begin ProBiotic -Align daily .  If still constipated , can use Enema x 1 .  follow up Dr. Kriste Basque  As planned and As needed   Please contact office for sooner follow up if symptoms do not improve or worsen or seek emergency care - If not improving will need referral to Dr. Arlyce Dice in GI.

## 2012-10-27 NOTE — Progress Notes (Signed)
Subjective:     Patient ID: Alan [name], male   DOB: [birth year], 72 y.o.   MRN: [id number]  HPI  72 y/o BM    10/27/2012 ER follow up  Complains of constipation x2 weeks, w/ last good BM 1 week ago.  Had small stools last couple of days.  Unable to afford Linzess; restarted miralax and high fiber diet yesterday. Seen at urgent care on 10/25/12 for constipation. He was started on Lactulose Twice daily  .  We discussed his bowel regimen.along w/ high fiber and water intake.  Has gas bloating, fullness , and lower abd pain on/off. Abd films showed gas/stool throughout the colon-nonobstructive bower gas pattern.   He denies fever, bloody stools, n/v, or urinary symptoms.   Has a hx of constipation , previously seen by GI. Last colon 08/2012 w/ no acute issues except for hemorrhoids .  Rx Linzess in past which helped some but unable to afford.   Problem List:   OBSTRUCTIVE SLEEP APNEA (ICD-327.23) - s/p sleep study 1/08 showing RDI=16, and desat to 89%... he had 4+leg jerks and given trial Requip (?helped)... states he rests well and wakes feeling refreshed most of the time "I live w/ it, it's not that bad"...  HYPERTENSION (ICD-401.9) - controlled on NORVASC 5mg /d, and prev on Cardura but this was switched by [name]... BP=118/82, tol meds well... denies HA, fatigue, visual changes, CP, palipit, dizziness, syncope, dyspnea, edema, etc... he had a Cardiolite scan in 1997 that was neg/ normal w/ EF=75%...  HYPERCHOLESTEROLEMIA (ICD-272.0) - on SIMVASTATIN 20mg /d + diet...  ~  FLP 3/08 showed TChol 157, TG 53, HDL 53, LDL 94 ~  FLP 3/09 showed TChol 124, TG 33, HDL 44, LDL 74 ~  FLP 4/10 showed TChol 123, TG 36, HDL 41, LDL 75 ~  FLP 3/12 showed TChol 148, TG 47, HDL 49, LDL 89... continue Simva20. ~  FLP 10/13 on Simva20 showed TChol 146, TG 68, HDL 45, LDL 88... Continue same.  GERD (ICD-530.81) - prev rec to take Prevacid 30mg  & Zantac 150Qhs... GI eval 1/11 by Dorris Singh w/ rec to  take PRILOSEC daily ~  EGD 5/02 showing esophagitis and gastritis... ~  EGD 1/11 showed GE junction erosions & duodenitis... Rx Prilosec & improved symptomatically.  DIVERTICULOSIS OF COLON (ICD-562.10) - on Metamucil daily...  IRRITABLE BOWEL SYNDROME w/ CONSTIPATION > on LINZESS 145mg /d per GI... COLONIC POLYPS (ICD-211.3) ~  colonoscopy 5/07 by DrSam showing divertics and 3-65mm adenomatous polyps removed... f/u planned 108yrs... ~  colonoscopy 1/11 w/ several pedunculated polyps & path was neg- benign colonic mucosa w/o adenomatous change.  BENIGN PROSTATIC HYPERTROPHY, HX OF (ICD-V13.8) - seen by DrHumphries previously... now seeing [name] but pt doesn't feel that he is doing as well on the [facility] [address] 0.4mg /d (prev on [name] + Cardura)... I've encouraged him to discuss this w/ [name]... he sought 2nd opinion at [facility], [name]- enlarged prostate & median lobe, w/ low vol- hi press detrusor contraction, they rec surg for his bladder outlet obstruction but he declined & opted for FISASTERIDE 5mg /d & improved he says...  ERECTILE DYSFUNCTION (ICD-302.72) - Tried all 3 PDE4 inhibitors, but not much benefit... consider shots.  ~  labs in 2010 at Urology office was 1.47 per patient. ~  2001: he tells me he had his PSA checked at [facility] & it was "OK"...  RESTLESS LEG SYNDROME (ICD-333.94) - prev on Requip 1mg Qhs, but stopped this med due to stomach side effects ... he notes no diff  w/ resting- good nights, and bad nights...   ANXIETY (ICD-300.00) - he does not desire anxiolytic Rx...  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   Past Medical History  Diagnosis Date  . Sleep apnea     does not use cpap  . Unspecified essential hypertension   . Hypercholesteremia   . GERD (gastroesophageal reflux disease)   . Diverticulosis of colon (without mention of hemorrhage)   . Irritable bowel syndrome   . Benign neoplasm of colon     colon polyps  . Benign  hypertrophy of prostate   . Erectile dysfunction   . Restless legs syndrome (RLS)   . Anxiety state, unspecified   . Onychomycosis     Past Surgical History  Procedure Laterality Date  . None    . Colonoscopy      Outpatient Encounter Prescriptions as of 10/27/2012  Medication Sig Dispense Refill  . amLODipine (NORVASC) 5 MG tablet Take 1 tablet (5 mg total) by mouth daily.  90 tablet  3  . aspirin 81 MG tablet Take 81 mg by mouth daily.      . brimonidine-timolol (COMBIGAN) 0.2-0.5 % ophthalmic solution Place 1 drop into both eyes every 12 (twelve) hours.      . dorzolamide-timolol (COSOPT) 22.3-6.8 MG/ML ophthalmic solution Place 1 drop into both eyes daily.       . finasteride ([name]) 5 MG tablet Take 1 tablet (5 mg total) by mouth daily.  90 tablet  3  . lactulose (CHRONULAC) 10 GM/15ML solution Take 15 mLs (10 g total) by mouth 2 (two) times daily. Until bowel movement.  Stop if diarrhea.  240 mL  0  . omeprazole (PRILOSEC) 20 MG capsule Take 1 capsule (20 mg total) by mouth daily.  90 capsule  3  . polyethylene glycol (MIRALAX / GLYCOLAX) packet Take 17 g by mouth daily.      . simvastatin (ZOCOR) 20 MG tablet Take 1 tablet (20 mg total) by mouth at bedtime.  90 tablet  3  . tadalafil (CIALIS) 20 MG tablet Take as directed  10 tablet  prn  . Tamsulosin HCl (FLOMAX) 0.4 MG CAPS Take 0.4 mg by mouth daily after supper.       . [DISCONTINUED] Linaclotide (LINZESS) 145 MCG CAPS Take 1 capsule (145 mcg total) by mouth daily.  30 capsule  6   No facility-administered encounter medications on file as of 10/27/2012.    Allergies  Allergen Reactions  . Codeine Other (See Comments)    REACTION: hallucinations,faint,dizziness    Current Medications, Allergies, Past Medical History, Past Surgical History, Family History, and Social History were reviewed in [employer] record.   Review of Systems         Constitutional:   No  weight loss, night sweats,   Fevers, chills, fatigue, or  lassitude.  HEENT:   No headaches,  Difficulty swallowing,  Tooth/dental problems, or  Sore throat,                No sneezing, itching, ear ache, nasal congestion, post nasal drip,   CV:  No chest pain,  Orthopnea, PND, swelling in lower extremities, anasarca, dizziness, palpitations, syncope.   GI  No heartburn,  vomiting, diarrhea,   loss of appetite, bloody stools.   Resp: No shortness of breath with exertion or at rest.  No excess mucus, no productive cough,  No non-productive cough,  No coughing up of blood.  No change in  color of mucus.  No wheezing.  No chest wall deformity  Skin: no rash or lesions.  GU: no dysuria, change in color of urine, no urgency or frequency.  No flank pain, no hematuria   MS:  No joint pain or swelling.  No decreased range of motion.  No back pain.  Psych:  No change in mood or affect. No depression or anxiety.  No memory loss.       Objective:   Physical Exam     WD, WN, 72 y/o BM in NAD... GENERAL:  Alert & oriented; pleasant & cooperative... HEENT:    EACs-clear, TMs-wnl, NOSE-clear, THROAT-clear & wnl. NECK:  Supple w/ full ROM; no JVD; normal carotid impulses w/o bruits; no thyromegaly or nodules palpated; no lymphadenopathy. CHEST:  Clear to P & A; without wheezes/ rales/ or rhonchi. HEART:  Regular Rhythm; without murmurs/ rubs/ or gallops. ABDOMEN:  Soft & nontender; normal bowel sounds; no organomegaly or masses detected. EXT: without deformities, mild arthritic changes; no varicose veins/ venous insuffic/ or edema. NEURO:  CN's intact; motor testing normal; sensory testing normal; gait normal & balance OK. DERM:  No lesions noted; no rash etc..[name]

## 2012-11-12 ENCOUNTER — Inpatient Hospital Stay (HOSPITAL_COMMUNITY)
Admission: EM | Admit: 2012-11-12 | Discharge: 2012-11-17 | DRG: 373 | Disposition: A | Discharge status: Home / Self Care | Payer: Medicare Other | Attending: General Surgery | Admitting: General Surgery

## 2012-11-12 ENCOUNTER — Encounter (HOSPITAL_COMMUNITY): Payer: Self-pay | Admitting: Emergency Medicine

## 2012-11-12 ENCOUNTER — Emergency Department (HOSPITAL_COMMUNITY): Payer: Medicare Other

## 2012-11-12 DIAGNOSIS — K3533 Acute appendicitis with perforation and localized peritonitis, with abscess: Principal | ICD-10-CM | POA: Diagnosis present

## 2012-11-12 DIAGNOSIS — N320 Bladder-neck obstruction: Secondary | ICD-10-CM | POA: Diagnosis present

## 2012-11-12 DIAGNOSIS — K3532 Acute appendicitis with perforation and localized peritonitis, without abscess: Secondary | ICD-10-CM

## 2012-11-12 DIAGNOSIS — E7800 Pure hypercholesterolemia, unspecified: Secondary | ICD-10-CM | POA: Diagnosis present

## 2012-11-12 DIAGNOSIS — N401 Enlarged prostate with lower urinary tract symptoms: Secondary | ICD-10-CM | POA: Diagnosis present

## 2012-11-12 DIAGNOSIS — Z87891 Personal history of nicotine dependence: Secondary | ICD-10-CM

## 2012-11-12 DIAGNOSIS — N139 Obstructive and reflux uropathy, unspecified: Secondary | ICD-10-CM | POA: Diagnosis present

## 2012-11-12 DIAGNOSIS — I10 Essential (primary) hypertension: Secondary | ICD-10-CM | POA: Diagnosis present

## 2012-11-12 DIAGNOSIS — K651 Peritoneal abscess: Secondary | ICD-10-CM

## 2012-11-12 DIAGNOSIS — K35209 Acute appendicitis with generalized peritonitis, without abscess, unspecified as to perforation: Secondary | ICD-10-CM

## 2012-11-12 DIAGNOSIS — K219 Gastro-esophageal reflux disease without esophagitis: Secondary | ICD-10-CM | POA: Diagnosis present

## 2012-11-12 DIAGNOSIS — N138 Other obstructive and reflux uropathy: Secondary | ICD-10-CM | POA: Diagnosis present

## 2012-11-12 DIAGNOSIS — K352 Acute appendicitis with generalized peritonitis, without abscess: Secondary | ICD-10-CM

## 2012-11-12 LAB — CBC WITH DIFFERENTIAL/PLATELET
Eosinophils Relative: 0 % (ref 0–5)
Hemoglobin: 14.1 g/dL (ref 13.0–17.0)
Lymphocytes Relative: 21 % (ref 12–46)
Lymphs Abs: 1.5 10*3/uL (ref 0.7–4.0)
MCV: 84.3 fL (ref 78.0–100.0)
Neutrophils Relative %: 66 % (ref 43–77)
Platelets: 317 10*3/uL (ref 150–400)
RBC: 4.9 MIL/uL (ref 4.22–5.81)
WBC: 7.3 10*3/uL (ref 4.0–10.5)

## 2012-11-12 LAB — URINALYSIS, ROUTINE W REFLEX MICROSCOPIC
Bilirubin Urine: NEGATIVE
Hgb urine dipstick: NEGATIVE
Protein, ur: NEGATIVE mg/dL
Urobilinogen, UA: 0.2 mg/dL (ref 0.0–1.0)

## 2012-11-12 LAB — COMPREHENSIVE METABOLIC PANEL
ALT: 13 U/L (ref 0–53)
Alkaline Phosphatase: 51 U/L (ref 39–117)
CO2: 24 mEq/L (ref 19–32)
GFR calc Af Amer: 80 mL/min — ABNORMAL LOW (ref >90)
GFR calc non Af Amer: 69 mL/min — ABNORMAL LOW (ref >90)
Glucose, Bld: 108 mg/dL — ABNORMAL HIGH (ref 70–99)
Potassium: 3.6 mEq/L (ref 3.5–5.1)
Sodium: 138 mEq/L (ref 135–145)

## 2012-11-12 MED ORDER — SODIUM CHLORIDE 0.9 % IV SOLN: INTRAVENOUS | Status: DC

## 2012-11-12 MED ORDER — ONDANSETRON HCL 4 MG/2ML IJ SOLN
4.0000 mg | Freq: Once | INTRAMUSCULAR | Status: DC
  Filled 2012-11-12: qty 2

## 2012-11-12 MED ORDER — SODIUM CHLORIDE 0.9 % IV SOLN
1.0000 g | Freq: Once | INTRAVENOUS | Status: AC
  Administered 2012-11-12: 1 g via INTRAVENOUS
  Filled 2012-11-12: qty 1

## 2012-11-12 MED ORDER — ONDANSETRON HCL 4 MG/2ML IJ SOLN
4.0000 mg | Freq: Once | INTRAMUSCULAR | Status: AC
  Administered 2012-11-12: 4 mg via INTRAVENOUS

## 2012-11-12 MED ORDER — HYDROMORPHONE HCL PF 1 MG/ML IJ SOLN
0.5000 mg | Freq: Once | INTRAMUSCULAR | Status: AC
  Administered 2012-11-12: 0.5 mg via INTRAVENOUS
  Filled 2012-11-12: qty 1

## 2012-11-12 MED ORDER — ONDANSETRON HCL 4 MG/2ML IJ SOLN: 4.0000 mg | Freq: Three times a day (TID) | INTRAMUSCULAR | Status: DC | PRN

## 2012-11-12 MED ORDER — IOHEXOL 300 MG/ML  SOLN
100.0000 mL | Freq: Once | INTRAMUSCULAR | Status: AC | PRN
  Administered 2012-11-12: 100 mL via INTRAVENOUS

## 2012-11-12 MED ORDER — SODIUM CHLORIDE 0.9 % IV SOLN
1000.0000 mL | Freq: Once | INTRAVENOUS | Status: AC
  Administered 2012-11-12: 1000 mL via INTRAVENOUS

## 2012-11-12 MED ORDER — HYDROMORPHONE HCL PF 1 MG/ML IJ SOLN: 1.0000 mg | INTRAMUSCULAR | Status: DC | PRN

## 2012-11-12 MED ORDER — MORPHINE SULFATE 2 MG/ML IJ SOLN
2.0000 mg | INTRAMUSCULAR | Status: DC | PRN
  Administered 2012-11-12 – 2012-11-15 (×5): 2 mg via INTRAVENOUS
  Filled 2012-11-12 (×5): qty 1

## 2012-11-12 MED ORDER — SODIUM CHLORIDE 0.9 % IV SOLN
1.0000 g | INTRAVENOUS | Status: DC
  Administered 2012-11-13 – 2012-11-16 (×4): 1 g via INTRAVENOUS
  Filled 2012-11-12 (×4): qty 1

## 2012-11-12 MED ORDER — KCL IN DEXTROSE-NACL 20-5-0.45 MEQ/L-%-% IV SOLN
INTRAVENOUS | Status: DC
  Administered 2012-11-12 – 2012-11-16 (×4): via INTRAVENOUS
  Filled 2012-11-12 (×7): qty 1000

## 2012-11-12 MED ORDER — ENOXAPARIN SODIUM 40 MG/0.4ML ~~LOC~~ SOLN
40.0000 mg | SUBCUTANEOUS | Status: DC
  Administered 2012-11-12 – 2012-11-16 (×5): 40 mg via SUBCUTANEOUS
  Filled 2012-11-12 (×6): qty 0.4

## 2012-11-12 NOTE — H&P (Signed)
Alan [name] is an 72 y.o. male.   Chief Complaint: RLQ pain HPI: the patient states 3-4 weeks of lower abd pain, localized more to rlq.  He denies fevers, he has had some nausea.  No diarrhea.  He has been to his doctor on several occasions and treated for constipation.  He states the pain is worse when lying down.  Past Medical History  Diagnosis Date  . Sleep apnea     does not use cpap  . Unspecified essential hypertension   . Hypercholesteremia   . GERD (gastroesophageal reflux disease)   . Diverticulosis of colon (without mention of hemorrhage)   . Irritable bowel syndrome   . Benign neoplasm of colon     colon polyps  . Benign hypertrophy of prostate   . Erectile dysfunction   . Restless legs syndrome (RLS)   . Anxiety state, unspecified   . Onychomycosis     Past Surgical History  Procedure Laterality Date  . None    . Colonoscopy      Family History  Problem Relation Age of Onset  . Colon cancer Paternal Grandfather 27   Social History:  reports that he quit smoking about 44 years ago. He has never used smokeless tobacco. He reports that he does not drink alcohol or use illicit drugs.  Allergies:  Allergies  Allergen Reactions  . Codeine Other (See Comments)    REACTION: hallucinations,faint,dizziness     (Not in a hospital admission)  Results for orders placed during the hospital encounter of 11/12/12 (from the past 48 hour(s))  CBC WITH DIFFERENTIAL     Status: None   Collection Time    11/12/12  3:20 PM      Result Value Range   WBC 7.3  4.0 - 10.5 K/uL   RBC 4.90  4.22 - 5.81 MIL/uL   Hemoglobin 14.1  13.0 - 17.0 g/dL   HCT 09.8  11.9 - 14.7 %   MCV 84.3  78.0 - 100.0 fL   MCH 28.8  26.0 - 34.0 pg   MCHC 34.1  30.0 - 36.0 g/dL   RDW 82.9  56.2 - 13.0 %   Platelets 317  150 - 400 K/uL   Neutrophils Relative % 66  43 - 77 %   Neutro Abs 4.8  1.7 - 7.7 K/uL   Lymphocytes Relative 21  12 - 46 %   Lymphs Abs 1.5  0.7 - 4.0 K/uL   Monocytes  Relative 12  3 - 12 %   Monocytes Absolute 0.9  0.1 - 1.0 K/uL   Eosinophils Relative 0  0 - 5 %   Eosinophils Absolute 0.0  0.0 - 0.7 K/uL   Basophils Relative 0  0 - 1 %   Basophils Absolute 0.0  0.0 - 0.1 K/uL  COMPREHENSIVE METABOLIC PANEL     Status: Abnormal   Collection Time    11/12/12  3:20 PM      Result Value Range   Sodium 138  135 - 145 mEq/L   Potassium 3.6  3.5 - 5.1 mEq/L   Chloride 105  96 - 112 mEq/L   CO2 24  19 - 32 mEq/L   Glucose, Bld 108 (*) 70 - 99 mg/dL   BUN 15  6 - 23 mg/dL   Creatinine, Ser 8.65  0.50 - 1.35 mg/dL   Calcium 9.1  8.4 - 78.4 mg/dL   Total Protein 6.9  6.0 - 8.3 g/dL   Albumin 3.4 (*)  3.5 - 5.2 g/dL   AST 10  0 - 37 U/L   ALT 13  0 - 53 U/L   Alkaline Phosphatase 51  39 - 117 U/L   Total Bilirubin 0.7  0.3 - 1.2 mg/dL   GFR calc non Af Amer 69 (*) >90 mL/min   GFR calc Af Amer 80 (*) >90 mL/min   Comment:            The eGFR has been calculated     using the CKD EPI equation.     This calculation has not been     validated in all clinical     situations.     eGFR's persistently     <90 mL/min signify     possible Chronic Kidney Disease.  LIPASE, BLOOD     Status: None   Collection Time    11/12/12  3:20 PM      Result Value Range   Lipase 37  11 - 59 U/L  URINALYSIS, ROUTINE W REFLEX MICROSCOPIC     Status: Abnormal   Collection Time    11/12/12  3:54 PM      Result Value Range   Color, Urine AMBER (*) YELLOW   Comment: BIOCHEMICALS MAY BE AFFECTED BY COLOR   APPearance CLEAR  CLEAR   Specific Gravity, Urine 1.028  1.005 - 1.030   pH 6.0  5.0 - 8.0   Glucose, UA NEGATIVE  NEGATIVE mg/dL   Hgb urine dipstick NEGATIVE  NEGATIVE   Bilirubin Urine NEGATIVE  NEGATIVE   Ketones, ur NEGATIVE  NEGATIVE mg/dL   Protein, ur NEGATIVE  NEGATIVE mg/dL   Urobilinogen, UA 0.2  0.0 - 1.0 mg/dL   Nitrite NEGATIVE  NEGATIVE   Leukocytes, UA NEGATIVE  NEGATIVE   Comment: MICROSCOPIC NOT DONE ON URINES WITH NEGATIVE PROTEIN, BLOOD,  LEUKOCYTES, NITRITE, OR GLUCOSE <1000 mg/dL.   Ct Abdomen Pelvis W Contrast  11/12/2012   *RADIOLOGY REPORT*  Clinical Data: Right lower quadrant pain.  CT ABDOMEN AND PELVIS WITH CONTRAST  Technique:  Multidetector CT imaging of the abdomen and pelvis was performed following the standard protocol during bolus administration of intravenous contrast.  Contrast: OMNIPAQUE IOHEXOL 300 MG/ML  SOLN  Comparison: None.  Findings: Atelectasis at the lung bases.  There is no evidence for free intraperitoneal air.  Normal appearance of the liver, gallbladder and portal venous system.  Normal appearance of the spleen, pancreas, adrenal glands and both kidneys.  Evidence for bilateral renal cysts.  Mild fullness of the renal collecting systems bilaterally.  There is diffuse wall thickening of the urinary bladder and enlargement of the prostate.  Nodularity along the superior aspect the prostate that indents or protrudes into the bladder. There is probably a small bladder diverticulum on the right side.  There is an inflammatory process in the right lower quadrant which appears to be involving the tip of the appendix.  Within this inflammatory process, there is a focal low density area that roughly measures 1.4 x 0.9 x 1.3 cm.  Findings are suggestive for a developing abscess collection.  Multiple small lymph nodes in the right lower quadrant.  There is a small amount of fluid within the abdomen and pelvis.  No acute bony abnormality.  IMPRESSION:  There is inflammatory process in the right lower quadrant involving the distal aspect of the appendix.  Findings are concerning for an appendix perforation with phlegmon or developing abscess collection.   Small amount of fluid in the  abdomen and pelvis.  Bilateral kidney cysts.  Enlargement and nodularity of the prostate.  There is also diffuse bladder wall thickening.  Findings raise concern for BPH with bladder outlet obstruction. Recommend a non emergent urology  consultation to exclude a neoplastic process.  These results were called by telephone on 11/12/2012 at 5:36 p.m. to Dr. Jeraldine Loots, who verbally acknowledged these results.   Original Report Authenticated By: Richarda Overlie, M.D.    Review of Systems  Constitutional: Positive for malaise/fatigue. Negative for fever, chills and weight loss.  Respiratory: Negative for cough and shortness of breath.   Cardiovascular: Negative for chest pain.  Gastrointestinal: Positive for nausea, abdominal pain and constipation. Negative for vomiting and diarrhea.  Genitourinary: Negative for dysuria.  Musculoskeletal: Negative for myalgias.  Skin: Negative for rash.  Neurological: Negative for headaches.    Blood pressure 130/71, pulse 79, temperature 98.9 F (37.2 C), temperature source Oral, resp. rate 18, SpO2 97.00%. Physical Exam  Constitutional: He is oriented to person, place, and time. He appears well-developed and well-nourished.  HENT:  Head: Normocephalic and atraumatic.  Eyes: Conjunctivae are normal. Pupils are equal, round, and reactive to light.  Neck: Normal range of motion.  Cardiovascular: Normal rate and regular rhythm.   Respiratory: Effort normal and breath sounds normal. No respiratory distress.  GI: Soft. He exhibits no distension and no mass. There is tenderness. There is no rebound and no guarding.  Musculoskeletal: Normal range of motion.  Neurological: He is alert and oriented to person, place, and time.  Skin: Skin is warm and dry.     Assessment/Plan Ct shows rlq abscess. Most likely resolving perforated appendicitis.  It doesn't look latge enough to place a drain.  Will admit for iv antibiotics and pain control.  Will start invanz.  Will need f/u with urology for obstructed uropathy noted on ct.  Will place on cld for tonight.  Ariele Vidrio C. 11/12/2012, 7:55 PM

## 2012-11-12 NOTE — ED Notes (Signed)
MD at bedside. 

## 2012-11-12 NOTE — ED Notes (Signed)
Pt states that he has had intermittent abd pain x 3 wks.  Has been seen "several times" for this.  States "they are giving me a bandaid when I need a tourniquet".  States that he has been constipated and has been taking laxatives and now he is having normal bowel movements.  Had a colonoscopy in March and states that "things haven't been the same since".

## 2012-11-12 NOTE — ED Provider Notes (Signed)
History     CSN: [id number]  Arrival date & time 11/12/12  1500   First MD Initiated Contact with Patient 11/12/12 1529      Chief Complaint  Patient presents with  . Abdominal Pain   HPI  Patient presents with abdominal pain. Pain began more than one month ago, but has become worse over the past few weeks and days.  The pain initially was present with constipation, but after initiating a course of bowel therapy, this has resolved, the patient last had a bowel movement earlier today. The patient does complain of persistent nausea, and previously had vomiting. No vomiting today, or recently. The pain is focally about the middle abdomen, and right lower quadrant with radiation towards the right flank. The patient seems improved with standing, worse with sitting or supine positioning. Minimal relief with Tylenol. The patient has seen his primary care team several times for this. No prior CT scan for this.   Past Medical History  Diagnosis Date  . Sleep apnea     does not use cpap  . Unspecified essential hypertension   . Hypercholesteremia   . GERD (gastroesophageal reflux disease)   . Diverticulosis of colon (without mention of hemorrhage)   . Irritable bowel syndrome   . Benign neoplasm of colon     colon polyps  . Benign hypertrophy of prostate   . Erectile dysfunction   . Restless legs syndrome (RLS)   . Anxiety state, unspecified   . Onychomycosis     Past Surgical History  Procedure Laterality Date  . None    . Colonoscopy      Family History  Problem Relation Age of Onset  . Colon cancer Paternal Grandfather 54    History  Substance Use Topics  . Smoking status: Former Smoker    Quit date: 07/02/1968  . Smokeless tobacco: Never Used  . Alcohol Use: No      Review of Systems  Constitutional:       Per HPI, otherwise negative  HENT:       Per HPI, otherwise negative  Respiratory:       Per HPI, otherwise negative  Cardiovascular:       Per  HPI, otherwise negative  Gastrointestinal: Positive for nausea, vomiting, abdominal pain and constipation. Negative for blood in stool.  Endocrine:       Negative aside from HPI  Genitourinary: Positive for flank pain. Negative for urgency, hematuria, decreased urine volume, discharge, penile swelling, scrotal swelling, penile pain and testicular pain.  Musculoskeletal:       Per HPI, otherwise negative  Skin: Negative.   Neurological: Negative for syncope.    Allergies  Codeine  Home Medications   Current Outpatient Rx  Name  Route  Sig  Dispense  Refill  . amLODipine (NORVASC) 5 MG tablet   Oral   Take 1 tablet (5 mg total) by mouth daily.   90 tablet   3   . aspirin EC 81 MG tablet   Oral   Take 81 mg by mouth daily.         . brimonidine-timolol (COMBIGAN) 0.2-0.5 % ophthalmic solution   Both Eyes   Place 1 drop into both eyes every 12 (twelve) hours.         . finasteride ([name]) 5 MG tablet   Oral   Take 1 tablet (5 mg total) by mouth daily.   90 tablet   3   . lactulose (CHRONULAC) 10 GM/15ML solution  Oral   Take 10 g by mouth at bedtime.         [name] omeprazole (PRILOSEC) 20 MG capsule   Oral   Take 1 capsule (20 mg total) by mouth daily.   90 capsule   3   . polyethylene glycol (MIRALAX / GLYCOLAX) packet   Oral   Take 17 g by mouth daily.         . simvastatin (ZOCOR) 20 MG tablet   Oral   Take 20 mg by mouth daily.         . Tamsulosin HCl (FLOMAX) 0.4 MG CAPS   Oral   Take 0.4 mg by mouth daily after breakfast.            BP 106/61  Pulse 98  Temp(Src) 98.9 F (37.2 C) (Oral)  Resp 16  SpO2 100%  Physical Exam  Nursing note and vitals reviewed. Constitutional: He is oriented to person, place, and time. He appears well-developed. No distress.  HENT:  Head: Normocephalic and atraumatic.  Eyes: Conjunctivae and EOM are normal.  Cardiovascular: Normal rate and regular rhythm.   Pulmonary/Chest: Effort normal. No stridor.  No respiratory distress.  Abdominal: Soft. Normal appearance and bowel sounds are normal. He exhibits no distension. There is no hepatosplenomegaly. There is tenderness in the right upper quadrant, right lower quadrant and suprapubic area. There is guarding. There is no rigidity, no rebound and no CVA tenderness.  Musculoskeletal: He exhibits no edema.  Neurological: He is alert and oriented to person, place, and time.  Skin: Skin is warm and dry.  Psychiatric: He has a normal mood and affect.    ED Course  Procedures (including critical care time)  Labs Reviewed  CBC WITH DIFFERENTIAL  COMPREHENSIVE METABOLIC PANEL  LIPASE, BLOOD  URINALYSIS, ROUTINE W REFLEX MICROSCOPIC   No results found.   No diagnosis found.   Update: Patient appears clinically more comfortable following analgesics. MDM  Patient presents with ongoing abdominal pain.  He is afebrile, in no distress, though he has a markedly tender abdomen.  Given these concerns CT scan was performed, demonstrating ruptured appendicitis with abscess.  The patient was medically stable, but he received Vioxx, analgesics, was admitted for further evaluation and management.   Gerhard Munch, MD 11/12/12 [phone]

## 2012-11-12 NOTE — ED Notes (Signed)
ZOX:[id number]<AV> Expected date:<BR> Expected time:<BR> Means of arrival:<BR> Comments:<BR> Ems, left sided flank pain

## 2012-11-13 ENCOUNTER — Encounter (HOSPITAL_COMMUNITY): Payer: Self-pay | Admitting: General Practice

## 2012-11-13 DIAGNOSIS — K3533 Acute appendicitis with perforation and localized peritonitis, with abscess: Principal | ICD-10-CM | POA: Diagnosis present

## 2012-11-13 MED ORDER — CHLORHEXIDINE GLUCONATE 0.12 % MT SOLN
15.0000 mL | Freq: Two times a day (BID) | OROMUCOSAL | Status: DC
  Administered 2012-11-13 – 2012-11-15 (×6): 15 mL via OROMUCOSAL
  Filled 2012-11-13 (×9): qty 15

## 2012-11-13 MED ORDER — BIOTENE DRY MOUTH MT LIQD
15.0000 mL | Freq: Two times a day (BID) | OROMUCOSAL | Status: DC
  Administered 2012-11-13 – 2012-11-14 (×4): 15 mL via OROMUCOSAL

## 2012-11-13 NOTE — Progress Notes (Signed)
Acute appendicitis with peritoneal abscess  Assessment: Acute appendicitis with peri-appendiceal abscess, stable Hx BPH CT notes bladder wall thickening  Plan: Continue current management today Repeat labs in AM Urology consult at some point - Dr Patsi Sears is his urologist   Subjective: Feels slightly better, ambulating, minimal pain. Notes his history of BPH and that urine had unusual odor last few days. No nausea  Objective: Vital signs in last 24 hours: Temp:  [97.7 F (36.5 C)-98.9 F (37.2 C)] 98.5 F (36.9 C) (05/15 0547) Pulse Rate:  [70-98] 72 (05/15 0547) Resp:  [16-18] 18 (05/15 0547) BP: (106-130)/(61-75) 110/62 mmHg (05/15 0547) SpO2:  [96 %-100 %] 96 % (05/15 0547) Weight:  [172 lb (78.019 kg)] 172 lb (78.019 kg) (05/14 2322)    Intake/Output from previous day: 05/14 0701 - 05/15 0700 In: 410.8 [I.V.:410.8] Out: -   General appearance: alert, cooperative and no distress GI: Not distended, soft, but tender RLQ, mild with mild rebound to RLQ  Lab Results:  Results for orders placed during the hospital encounter of 11/12/12 (from the past 24 hour(s))  CBC WITH DIFFERENTIAL     Status: None   Collection Time    11/12/12  3:20 PM      Result Value Range   WBC 7.3  4.0 - 10.5 K/uL   RBC 4.90  4.22 - 5.81 MIL/uL   Hemoglobin 14.1  13.0 - 17.0 g/dL   HCT 16.1  09.6 - 04.5 %   MCV 84.3  78.0 - 100.0 fL   MCH 28.8  26.0 - 34.0 pg   MCHC 34.1  30.0 - 36.0 g/dL   RDW 40.9  81.1 - 91.4 %   Platelets 317  150 - 400 K/uL   Neutrophils Relative % 66  43 - 77 %   Neutro Abs 4.8  1.7 - 7.7 K/uL   Lymphocytes Relative 21  12 - 46 %   Lymphs Abs 1.5  0.7 - 4.0 K/uL   Monocytes Relative 12  3 - 12 %   Monocytes Absolute 0.9  0.1 - 1.0 K/uL   Eosinophils Relative 0  0 - 5 %   Eosinophils Absolute 0.0  0.0 - 0.7 K/uL   Basophils Relative 0  0 - 1 %   Basophils Absolute 0.0  0.0 - 0.1 K/uL  COMPREHENSIVE METABOLIC PANEL     Status: Abnormal   Collection Time   11/12/12  3:20 PM      Result Value Range   Sodium 138  135 - 145 mEq/L   Potassium 3.6  3.5 - 5.1 mEq/L   Chloride 105  96 - 112 mEq/L   CO2 24  19 - 32 mEq/L   Glucose, Bld 108 (*) 70 - 99 mg/dL   BUN 15  6 - 23 mg/dL   Creatinine, Ser 7.82  0.50 - 1.35 mg/dL   Calcium 9.1  8.4 - 95.6 mg/dL   Total Protein 6.9  6.0 - 8.3 g/dL   Albumin 3.4 (*) 3.5 - 5.2 g/dL   AST 10  0 - 37 U/L   ALT 13  0 - 53 U/L   Alkaline Phosphatase 51  39 - 117 U/L   Total Bilirubin 0.7  0.3 - 1.2 mg/dL   GFR calc non Af Amer 69 (*) >90 mL/min   GFR calc Af Amer 80 (*) >90 mL/min  LIPASE, BLOOD     Status: None   Collection Time    11/12/12  3:20 PM  Result Value Range   Lipase 37  11 - 59 U/L  URINALYSIS, ROUTINE W REFLEX MICROSCOPIC     Status: Abnormal   Collection Time    11/12/12  3:54 PM      Result Value Range   Color, Urine AMBER (*) YELLOW   APPearance CLEAR  CLEAR   Specific Gravity, Urine 1.028  1.005 - 1.030   pH 6.0  5.0 - 8.0   Glucose, UA NEGATIVE  NEGATIVE mg/dL   Hgb urine dipstick NEGATIVE  NEGATIVE   Bilirubin Urine NEGATIVE  NEGATIVE   Ketones, ur NEGATIVE  NEGATIVE mg/dL   Protein, ur NEGATIVE  NEGATIVE mg/dL   Urobilinogen, UA 0.2  0.0 - 1.0 mg/dL   Nitrite NEGATIVE  NEGATIVE   Leukocytes, UA NEGATIVE  NEGATIVE     Studies/Results Radiology     MEDS, Scheduled . antiseptic oral rinse  15 mL Mouth Rinse q12n4p  . chlorhexidine  15 mL Mouth Rinse BID  . enoxaparin (LOVENOX) injection  40 mg Subcutaneous Q24H  . ertapenem (INVANZ) IV  1 g Intravenous Q24H  . ondansetron  4 mg Intravenous Once       LOS: 1 day    Currie Paris, MD, [facility], [place] [phone]   11/13/2012 9:35 AM

## 2012-11-13 NOTE — Care Management Note (Signed)
    Page 1 of 1   11/13/2012     12:12:18 PM   CARE MANAGEMENT NOTE 11/13/2012  Patient:  Alan [name], Alan [name]   Account Number:  [id number]  Date Initiated:  11/13/2012  Documentation initiated by:  Lorenda Ishihara  Subjective/Objective Assessment:   72 yo male admitted with perforated appy. PTA lived at home with spouse.     Action/Plan:   Home when stable   Anticipated DC Date:  11/16/2012   Anticipated DC Plan:  HOME/SELF CARE      DC Planning Services  CM consult      Choice offered to / List presented to:             Status of service:  Completed, signed off Medicare Important Message given?   (If response is "NO", the following Medicare IM given date fields will be blank) Date Medicare IM given:   Date Additional Medicare IM given:    Discharge Disposition:  HOME/SELF CARE  Per UR Regulation:  Reviewed for med. necessity/level of care/duration of stay  If discussed at Long Length of Stay Meetings, dates discussed:    Comments:

## 2012-11-14 LAB — CBC
HCT: 38.1 % — ABNORMAL LOW (ref 39.0–52.0)
Hemoglobin: 12.4 g/dL — ABNORMAL LOW (ref 13.0–17.0)
MCH: 27.7 pg (ref 26.0–34.0)
MCV: 85 fL (ref 78.0–100.0)
RBC: 4.48 MIL/uL (ref 4.22–5.81)
WBC: 4.2 10*3/uL (ref 4.0–10.5)

## 2012-11-14 MED ORDER — ONDANSETRON HCL 4 MG/2ML IJ SOLN
4.0000 mg | Freq: Once | INTRAMUSCULAR | Status: AC
  Administered 2012-11-14: 4 mg via INTRAVENOUS
  Filled 2012-11-14: qty 2

## 2012-11-14 MED ORDER — FINASTERIDE 5 MG PO TABS
5.0000 mg | ORAL_TABLET | Freq: Every day | ORAL | Status: DC
  Administered 2012-11-14 – 2012-11-17 (×4): 5 mg via ORAL
  Filled 2012-11-14 (×4): qty 1

## 2012-11-14 MED ORDER — TAMSULOSIN HCL 0.4 MG PO CAPS
0.4000 mg | ORAL_CAPSULE | Freq: Every day | ORAL | Status: DC
  Administered 2012-11-14 – 2012-11-16 (×3): 0.4 mg via ORAL
  Filled 2012-11-14 (×5): qty 1

## 2012-11-14 NOTE — Progress Notes (Signed)
Patient ID: Alan [name], male   DOB: 03-Mar-1941, 72 y.o.   MRN: [id number] Acute appendicitis with peritoneal abscess  Assessment: Acute appendicitis with peri-appendiceal abscess, stable, WBC normalized Hx BPH CT notes bladder wall thickening  Plan: Continue current management today Repeat labs in AM Urology consulted - Dr Patsi Sears is his urologist, spoke with [facility]   Subjective: Had increased pain last night although better this am, denies n/v, feels like he is just stiff from not walking much, no other complaints  Objective: Vital signs in last 24 hours: Temp:  [97.6 F (36.4 C)-97.8 F (36.6 C)] 97.8 F (36.6 C) (05/16 0608) Pulse Rate:  [73-82] 73 (05/16 0608) Resp:  [18] 18 (05/16 0608) BP: (123-145)/(69-85) 145/84 mmHg (05/16 0608) SpO2:  [98 %-99 %] 98 % (05/16 0608)    Intake/Output from previous day: 05/15 0701 - 05/16 0700 In: 949.8 [I.V.:949.8] Out: 650 [Urine:650]  General appearance: alert, cooperative and no distress GI: Not distended, soft, only mildly tender RLQ  Lab Results:  Results for orders placed during the hospital encounter of 11/12/12 (from the past 24 hour(s))  CBC     Status: Abnormal   Collection Time    11/14/12  3:58 AM      Result Value Range   WBC 4.2  4.0 - 10.5 K/uL   RBC 4.48  4.22 - 5.81 MIL/uL   Hemoglobin 12.4 (*) 13.0 - 17.0 g/dL   HCT 62.9 (*) 52.8 - 41.3 %   MCV 85.0  78.0 - 100.0 fL   MCH 27.7  26.0 - 34.0 pg   MCHC 32.5  30.0 - 36.0 g/dL   RDW 24.4  01.0 - 27.2 %   Platelets 265  150 - 400 K/uL     Studies/Results Radiology     MEDS, Scheduled . antiseptic oral rinse  15 mL Mouth Rinse q12n4p  . chlorhexidine  15 mL Mouth Rinse BID  . enoxaparin (LOVENOX) injection  40 mg Subcutaneous Q24H  . ertapenem (INVANZ) IV  1 g Intravenous Q24H  . ondansetron  4 mg Intravenous Once       LOS: 2 days    WHITE, [facility], [place] [phone]   11/14/2012 9:29  AM

## 2012-11-14 NOTE — Consult Note (Signed)
Urology Consult  Referring physician:   Dr. Erby Pian Reason for referral:   BPH  Chief Complaint:  Poor urination  History of Present Illness:   72 yo Alan [name] with hx of BPH, treated successfully with combination of Flomax and finesteride. However, he has not received his meds in hospital. He remains NPO, with IV fluids and antibiotics, as Rx for ruptured appendix, trying to avoid surgical exploration. He believes he is voiding adequately, despite being off his meds for 3 days.   Past Medical History  Diagnosis Date  . Sleep apnea     does not use cpap  . Unspecified essential hypertension   . Hypercholesteremia   . GERD (gastroesophageal reflux disease)   . Diverticulosis of colon (without mention of hemorrhage)   . Irritable bowel syndrome   . Benign neoplasm of colon     colon polyps  . Benign hypertrophy of prostate   . Erectile dysfunction   . Restless legs syndrome (RLS)   . Anxiety state, unspecified   . Onychomycosis    Past Surgical History  Procedure Laterality Date  . None    . Colonoscopy      Medications: I have reviewed the patient's current medications. Allergies:  Allergies  Allergen Reactions  . Codeine Other (See Comments)    REACTION: hallucinations,faint,dizziness    Family History  Problem Relation Age of Onset  . Colon cancer Paternal Grandfather 6   Social History:  reports that he quit smoking about 44 years ago. He has never used smokeless tobacco. He reports that he does not drink alcohol or use illicit drugs.  ROS: All systems are reviewed and negative except as noted. Frequency, urgency, hesitancy, but good stream today.   Physical Exam:  Vital signs in last 24 hours: Temp:  [97.6 F (36.4 C)-97.8 F (36.6 C)] 97.8 F (36.6 C) (05/16 1610) Pulse Rate:  [73-82] 73 (05/16 0608) Resp:  [18] 18 (05/16 0608) BP: (123-145)/(69-85) 145/84 mmHg (05/16 0608) SpO2:  [98 %-99 %] 98 % (05/16 9604)  Cardiovascular: Skin warm; not  flushed Respiratory: Breaths quiet; no shortness of breath Abdomen: No masses Neurological: Normal sensation to touch Musculoskeletal: Normal motor function arms and legs Lymphatics: No inguinal adenopathy Skin: No rashes Genitourinary: Normal penis, scrotum, testes. Prostate 4= benign. Normal sphincter tone.   Laboratory Data:  Results for orders placed during the hospital encounter of 11/12/12 (from the past 72 hour(s))  CBC WITH DIFFERENTIAL     Status: None   Collection Time    11/12/12  3:20 PM      Result Value Range   WBC 7.3  4.0 - 10.5 K/uL   RBC 4.90  4.22 - 5.81 MIL/uL   Hemoglobin 14.1  13.0 - 17.0 g/dL   HCT 54.0  98.1 - 19.1 %   MCV 84.3  78.0 - 100.0 fL   MCH 28.8  26.0 - 34.0 pg   MCHC 34.1  30.0 - 36.0 g/dL   RDW 47.8  29.5 - 62.1 %   Platelets 317  150 - 400 K/uL   Neutrophils Relative % 66  43 - 77 %   Neutro Abs 4.8  1.7 - 7.7 K/uL   Lymphocytes Relative 21  12 - 46 %   Lymphs Abs 1.5  0.7 - 4.0 K/uL   Monocytes Relative 12  3 - 12 %   Monocytes Absolute 0.9  0.1 - 1.0 K/uL   Eosinophils Relative 0  0 - 5 %   Eosinophils Absolute 0.0  0.0 - 0.7 K/uL   Basophils Relative 0  0 - 1 %   Basophils Absolute 0.0  0.0 - 0.1 K/uL  COMPREHENSIVE METABOLIC PANEL     Status: Abnormal   Collection Time    11/12/12  3:20 PM      Result Value Range   Sodium 138  135 - 145 mEq/L   Potassium 3.6  3.5 - 5.1 mEq/L   Chloride 105  96 - 112 mEq/L   CO2 24  19 - 32 mEq/L   Glucose, Bld 108 (*) 70 - 99 mg/dL   BUN 15  6 - 23 mg/dL   Creatinine, Ser 7.82  0.50 - 1.35 mg/dL   Calcium 9.1  8.4 - 95.6 mg/dL   Total Protein 6.9  6.0 - 8.3 g/dL   Albumin 3.4 (*) 3.5 - 5.2 g/dL   AST 10  0 - 37 U/L   ALT 13  0 - 53 U/L   Alkaline Phosphatase 51  39 - 117 U/L   Total Bilirubin 0.7  0.3 - 1.2 mg/dL   GFR calc non Af Amer 69 (*) >90 mL/min   GFR calc Af Amer 80 (*) >90 mL/min   Comment:            The eGFR has been calculated     using the CKD EPI equation.     This  calculation has not been     validated in all clinical     situations.     eGFR's persistently     <90 mL/min signify     possible Chronic Kidney Disease.  LIPASE, BLOOD     Status: None   Collection Time    11/12/12  3:20 PM      Result Value Range   Lipase 37  11 - 59 U/L  URINALYSIS, ROUTINE W REFLEX MICROSCOPIC     Status: Abnormal   Collection Time    11/12/12  3:54 PM      Result Value Range   Color, Urine AMBER (*) YELLOW   Comment: BIOCHEMICALS MAY BE AFFECTED BY COLOR   APPearance CLEAR  CLEAR   Specific Gravity, Urine 1.028  1.005 - 1.030   pH 6.0  5.0 - 8.0   Glucose, UA NEGATIVE  NEGATIVE mg/dL   Hgb urine dipstick NEGATIVE  NEGATIVE   Bilirubin Urine NEGATIVE  NEGATIVE   Ketones, ur NEGATIVE  NEGATIVE mg/dL   Protein, ur NEGATIVE  NEGATIVE mg/dL   Urobilinogen, UA 0.2  0.0 - 1.0 mg/dL   Nitrite NEGATIVE  NEGATIVE   Leukocytes, UA NEGATIVE  NEGATIVE   Comment: MICROSCOPIC NOT DONE ON URINES WITH NEGATIVE PROTEIN, BLOOD, LEUKOCYTES, NITRITE, OR GLUCOSE <1000 mg/dL.  CBC     Status: Abnormal   Collection Time    11/14/12  3:58 AM      Result Value Range   WBC 4.2  4.0 - 10.5 K/uL   RBC 4.48  4.22 - 5.81 MIL/uL   Hemoglobin 12.4 (*) 13.0 - 17.0 g/dL   HCT 21.3 (*) 08.6 - 57.8 %   MCV 85.0  78.0 - 100.0 fL   MCH 27.7  26.0 - 34.0 pg   MCHC 32.5  30.0 - 36.0 g/dL   RDW 46.9  62.9 - Alan.8 %   Platelets 265  150 - 400 K/uL   No results found for this or any previous visit (from the past 240 hour(s)). Creatinine:  Recent Labs  11/12/12 1520  CREATININE 1.05  Xrays: See report/chart Clinical Data: Right lower quadrant pain.  CT ABDOMEN AND PELVIS WITH CONTRAST  Technique: Multidetector CT imaging of the abdomen and pelvis was  performed following the standard protocol during bolus  administration of intravenous contrast.  Contrast: OMNIPAQUE IOHEXOL 300 MG/ML SOLN  Comparison: None.  Findings: Atelectasis at the lung bases. There is no evidence  for  free intraperitoneal air.  Normal appearance of the liver, gallbladder and portal venous  system. Normal appearance of the spleen, pancreas, adrenal glands  and both kidneys. Evidence for bilateral renal cysts. Mild  fullness of the renal collecting systems bilaterally. There is  diffuse wall thickening of the urinary bladder and enlargement of  the prostate. Nodularity along the superior aspect the prostate  that indents or protrudes into the bladder. There is probably a  small bladder diverticulum on the right side.  There is an inflammatory process in the right lower quadrant which  appears to be involving the tip of the appendix. Within this  inflammatory process, there is a focal low density area that  roughly measures 1.4 x 0.9 x 1.3 cm. Findings are suggestive for a  developing abscess collection. Multiple small lymph nodes in the  right lower quadrant. There is a small amount of fluid within the  abdomen and pelvis. No acute bony abnormality.  IMPRESSION: There is inflammatory process in the right lower  quadrant involving the distal aspect of the appendix. Findings are  concerning for an appendix perforation with phlegmon or developing  abscess collection. Small amount of fluid in the abdomen and  pelvis.  Bilateral kidney cysts.  Enlargement and nodularity of the prostate. There is also diffuse  bladder wall thickening. Findings raise concern for BPH with  bladder outlet obstruction. Recommend a non emergent urology  consultation to exclude a neoplastic process.  These results were called by telephone on 11/12/2012 at 5:36 p.m.  to Dr. Jeraldine Loots, who verbally acknowledged these results.  Original Report Authenticated By: Richarda Overlie, M.D.    Impression/Assessment:    Known BPH, as probable cause of CT bladder changes. He has been voiding well on Flomax and finestereide. Recommend sips water and meds to avoid urinary retention.  He could have cysto as outpatient on  follow-up.   Plan:  1. Flomax and finesteride woth sips of water 2. Follow-up as op for  Cysto.   Ayelet Gruenewald I 11/14/2012, 10:36 AM

## 2012-11-14 NOTE — Progress Notes (Signed)
Patient interviewed and examined, agree with PA note above. Feels better but still moderately tender in the right lower quadrant. Continue IV antibiotics. May still require surgery it is not significantly improved the next couple of days. Mariella Saa MD, FACS  11/14/2012 3:47 PM

## 2012-11-15 LAB — CBC
HCT: 38.5 % — ABNORMAL LOW (ref 39.0–52.0)
Hemoglobin: 12.6 g/dL — ABNORMAL LOW (ref 13.0–17.0)
MCH: 27.6 pg (ref 26.0–34.0)
MCHC: 32.7 g/dL (ref 30.0–36.0)
MCV: 84.4 fL (ref 78.0–100.0)
Platelets: 293 10*3/uL (ref 150–400)
RBC: 4.56 MIL/uL (ref 4.22–5.81)
RDW: 13.6 % (ref 11.5–15.5)
WBC: 3.5 10*3/uL — ABNORMAL LOW (ref 4.0–10.5)

## 2012-11-15 MED ORDER — PANTOPRAZOLE SODIUM 40 MG PO TBEC
40.0000 mg | DELAYED_RELEASE_TABLET | Freq: Every day | ORAL | Status: DC
  Administered 2012-11-15 – 2012-11-17 (×3): 40 mg via ORAL
  Filled 2012-11-15 (×3): qty 1

## 2012-11-15 MED ORDER — LACTULOSE 10 GM/15ML PO SOLN
10.0000 g | Freq: Every day | ORAL | Status: DC
  Administered 2012-11-15 – 2012-11-16 (×2): 10 g via ORAL
  Filled 2012-11-15 (×3): qty 15

## 2012-11-15 MED ORDER — BRIMONIDINE TARTRATE 0.2 % OP SOLN
1.0000 [drp] | Freq: Two times a day (BID) | OPHTHALMIC | Status: DC
  Administered 2012-11-15 – 2012-11-17 (×5): 1 [drp] via OPHTHALMIC
  Filled 2012-11-15: qty 5

## 2012-11-15 MED ORDER — SIMVASTATIN 20 MG PO TABS
20.0000 mg | ORAL_TABLET | Freq: Every day | ORAL | Status: DC
  Administered 2012-11-15 – 2012-11-17 (×3): 20 mg via ORAL
  Filled 2012-11-15 (×3): qty 1

## 2012-11-15 MED ORDER — BRIMONIDINE TARTRATE-TIMOLOL 0.2-0.5 % OP SOLN: 1.0000 [drp] | Freq: Two times a day (BID) | OPHTHALMIC | Status: DC

## 2012-11-15 MED ORDER — TAMSULOSIN HCL 0.4 MG PO CAPS: 0.4000 mg | ORAL_CAPSULE | Freq: Every day | ORAL | Status: DC

## 2012-11-15 MED ORDER — AMLODIPINE BESYLATE 5 MG PO TABS
5.0000 mg | ORAL_TABLET | Freq: Every day | ORAL | Status: DC
  Administered 2012-11-15 – 2012-11-17 (×3): 5 mg via ORAL
  Filled 2012-11-15 (×3): qty 1

## 2012-11-15 MED ORDER — ASPIRIN EC 81 MG PO TBEC
81.0000 mg | DELAYED_RELEASE_TABLET | Freq: Every day | ORAL | Status: DC
  Administered 2012-11-15 – 2012-11-17 (×3): 81 mg via ORAL
  Filled 2012-11-15 (×3): qty 1

## 2012-11-15 MED ORDER — TIMOLOL MALEATE 0.5 % OP SOLN
1.0000 [drp] | Freq: Two times a day (BID) | OPHTHALMIC | Status: DC
  Filled 2012-11-15: qty 5

## 2012-11-15 MED ORDER — FINASTERIDE 5 MG PO TABS: 5.0000 mg | ORAL_TABLET | Freq: Every day | ORAL | Status: DC

## 2012-11-15 MED ORDER — POLYETHYLENE GLYCOL 3350 17 G PO PACK
17.0000 g | PACK | Freq: Every day | ORAL | Status: DC
  Administered 2012-11-15 – 2012-11-16 (×2): 17 g via ORAL
  Filled 2012-11-15 (×3): qty 1

## 2012-11-15 NOTE — Progress Notes (Signed)
Patient ID: Alan [name], male   DOB: 10/19/1940, 72 y.o.   MRN: [id number]    Subjective: He feels better. States the pain is nearly gone. No nausea. He has not had a bowel movement but is passing flatus. He has been n.p.o.  Objective: Vital signs in last 24 hours: Temp:  [98.2 F (36.8 C)-98.6 F (37 C)] 98.2 F (36.8 C) (05/17 4098) Pulse Rate:  [63-86] 63 (05/17 0613) Resp:  [16-20] 16 (05/17 0613) BP: (116-136)/(78-84) 136/84 mmHg (05/17 0613) SpO2:  [96 %-99 %] 99 % (05/17 0613)    Intake/Output from previous day: 05/16 0701 - 05/17 0700 In: 1249.2 [I.V.:1249.2] Out: 1975 [Urine:1975] Intake/Output this shift:    General appearance: alert, cooperative and no distress GI: now mild tenderness localized in the right lower quadrant with minimal guarding, clearly improved from the last couple of days.  Lab Results:   Recent Labs  11/14/12 0358 11/15/12 0437  WBC 4.2 3.5*  HGB 12.4* 12.6*  HCT 38.1* 38.5*  PLT 265 293   BMET  Recent Labs  11/12/12 1520  NA 138  K 3.6  CL 105  CO2 24  GLUCOSE 108*  BUN 15  CREATININE 1.05  CALCIUM 9.1     Studies/Results: No results found.  Anti-infectives: Anti-infectives   Start     Dose/Rate Route Frequency Ordered Stop   11/13/12 2000  ertapenem (INVANZ) 1 g in sodium chloride 0.9 % 50 mL IVPB     1 g 100 mL/hr over 30 Minutes Intravenous Every 24 hours 11/12/12 2047     11/12/12 1800  ertapenem (INVANZ) 1 g in sodium chloride 0.9 % 50 mL IVPB     1 g 100 mL/hr over 30 Minutes Intravenous  Once 11/12/12 1751 11/12/12 1924      Assessment/Plan: Apparent perforated appendicitis with delayed presentation with phlegmon small abscess. Clinically much improved on antibiotics. Start full liquid diet. Continue antibiotics today. I expect he will be ready for discharge in the next one to 2 days.    LOS: 3 days    Denai Caba T 11/15/2012

## 2012-11-16 NOTE — Progress Notes (Signed)
Pt asked why the MD did not advance his diet to regular since he had 2 bm today, he is doing well with his full liquid diet,  and the plan is for him to be discharged tomorrow.  MD documented that pt was in significant pain yesterday, but according to pt he only only had mild pain yesterday, scoring it about a 4 on a scale of 10.  Pt did not request any pain med yesterday either, just once last night.  Pt asked me to call MD and ask if he could regular food.  I contacted MD on call, explained the situation, and obtained order for pt to start a regular diet.  Will access pt after he eats regular food.

## 2012-11-16 NOTE — Progress Notes (Signed)
Patient ID: Alan [name], male   DOB: 10-06-1940, 72 y.o.   MRN: [id number] Patient ID: [name], male   DOB: 07-22-1940, 72 y.o.   MRN: [id number]    Subjective: He had some moderate pain yesterday and last night in the right lower quadrant 4/10. He had a bowel movement this morning. He is up and about this morning and denies any pain at all. No nausea. Tolerating full liquids well.  Objective: Vital signs in last 24 hours: Temp:  [98 F (36.7 C)-98.3 F (36.8 C)] 98 F (36.7 C) (05/18 0500) Pulse Rate:  [69-83] 74 (05/18 0500) Resp:  [18] 18 (05/18 0500) BP: (108-146)/(72-93) 108/72 mmHg (05/18 0500) SpO2:  [97 %] 97 % (05/18 0500) Last BM Date: 11/12/12  Intake/Output from previous day: 05/17 0701 - 05/18 0700 In: 1440.8 [P.O.:240; I.V.:1200.8] Out: 1400 [Urine:1400] Intake/Output this shift:    General appearance: alert, cooperative and no distress GI: now mild tenderness localized in the right lower quadrant with minimal guarding, improved from admission at about the same as yesterday.  Lab Results:   Recent Labs  11/14/12 0358 11/15/12 0437  WBC 4.2 3.5*  HGB 12.4* 12.6*  HCT 38.1* 38.5*  PLT 265 293   BMET No results found for this basename: NA, K, CL, CO2, GLUCOSE, BUN, CREATININE, CALCIUM,  in the last 72 hours   Studies/Results: No results found.  Anti-infectives: Anti-infectives   Start     Dose/Rate Route Frequency Ordered Stop   11/13/12 2000  ertapenem (INVANZ) 1 g in sodium chloride 0.9 % 50 mL IVPB     1 g 100 mL/hr over 30 Minutes Intravenous Every 24 hours 11/12/12 2047     11/12/12 1800  ertapenem (INVANZ) 1 g in sodium chloride 0.9 % 50 mL IVPB     1 g 100 mL/hr over 30 Minutes Intravenous  Once 11/12/12 1751 11/12/12 1924      Assessment/Plan: Apparent perforated appendicitis with delayed presentation with phlegmon small abscess. Clinically much improved on antibiotics. Continue full liquid diet. Continue antibiotics today.  He did still have some significant pain yesterday and mild tenderness today. Hopefully can go home tomorrow on oral antibiotics and followup. Check CBC tomorrow.    LOS: 4 days    Arvell Pulsifer T 11/16/2012

## 2012-11-17 LAB — CBC
HCT: 37.4 % — ABNORMAL LOW (ref 39.0–52.0)
Hemoglobin: 12.4 g/dL — ABNORMAL LOW (ref 13.0–17.0)
MCHC: 33.2 g/dL (ref 30.0–36.0)
MCV: 83.9 fL (ref 78.0–100.0)

## 2012-11-17 MED ORDER — CIPROFLOXACIN HCL 500 MG PO TABS: 500.0000 mg | ORAL_TABLET | Freq: Two times a day (BID) | ORAL | Status: DC

## 2012-11-17 MED ORDER — HYDROCODONE-ACETAMINOPHEN 5-325 MG PO TABS: 1.0000 | ORAL_TABLET | ORAL | Status: DC | PRN

## 2012-11-17 MED ORDER — HYDROCODONE-ACETAMINOPHEN 5-325 MG PO TABS: 1.0000 | ORAL_TABLET | Freq: Four times a day (QID) | ORAL | Status: DC | PRN

## 2012-11-17 MED ORDER — METRONIDAZOLE 500 MG PO TABS: 500.0000 mg | ORAL_TABLET | Freq: Three times a day (TID) | ORAL | Status: DC

## 2012-11-17 NOTE — Progress Notes (Signed)
Discharge instructions reviewed with patient and spouse, vital signs are stable, no complaints of pain or discomfort at this moment, patient to follow up with MD and urologist, prescription given and questions and concerns answered Stanford Breed RN 08-20-2012 10:33am

## 2012-11-17 NOTE — Discharge Summary (Signed)
Physician Discharge Summary  Patient ID: Alan [name] MRN: [id number] DOB/AGE: 72/30/1942 72 y.o.  Admit date: 11/12/2012 Discharge date: 11/17/2012  Admitting Diagnosis: Acute appendicitis with perforation Prostate enlargement BPH Possible bladder outlet obstruction  Discharge Diagnosis Patient Active Problem List   Diagnosis Date Noted  . Acute appendicitis with peritoneal abscess 11/13/2012  . CHEST WALL PAIN, ANTERIOR 05/29/2010  . OTHER CONSTIPATION 07/12/2009  . ONYCHOMYCOSIS 10/15/2008  . ANXIETY 10/15/2008  . 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 OBSTRUCTIVE SLEEP APNEA 09/21/2007  . RESTLESS LEG SYNDROME 09/21/2007  . DIVERTICULOSIS OF COLON 09/21/2007  . HYPERCHOLESTEROLEMIA 09/11/2007  . ERECTILE DYSFUNCTION 09/11/2007  . HYPERTENSION 09/11/2007  . GERD 09/11/2007  . IRRITABLE BOWEL SYNDROME 09/11/2007  . BENIGN PROSTATIC HYPERTROPHY, HX OF 09/11/2007  . COLONIC POLYPS 10/30/2005    Consultants Urology (Dr. Patsi Sears)  Imaging: No results found.  Procedures None  Hospital Course:  3-4 weeks of lower abd pain, localized more to RLQ. He denies fevers, he has had some nausea. No diarrhea. He has been to his doctor on several occasions and treated for constipation.  He states the pain is worse when lying down.  He presented to [facility] for further evaluation.  Workup showed appendicitis with perforation and phlegmon or abscess collection.  Incidentally his CT showed enlargement and nodularity of the prostate and diffuse bladder wall thickening concerning for BPH with bladder outlet obstruction.  Urology was consulted (Dr. Patsi Sears) and recommended OP f/u with cystoscopy.  Patient was admitted for conservative treatment given as opposed to surgery due to delayed diagnosis.  He was treated with IV antibiotics.  Diet was advanced as tolerated.  On HD #6, the patient was voiding well, tolerating diet, ambulating well, pain well controlled, vital signs stable, and felt stable  for discharge home.  Patient will follow up in our office in 2 weeks and knows to call with questions or concerns.  He will need to take his antibiotics for 2 weeks and understands he needs to f/u in the office to determine if surgery would be indicated in a few weeks.  He is also urged to f/u with Dr. Imelda Pillow office for additional workup of his prostate/bladder issues.    Physical Exam: General:  Alert, NAD, pleasant, comfortable Abd:  Soft, ND, mild tenderness in RLQ    Medication List    TAKE these medications       amLODipine 5 MG tablet  Commonly known as:  NORVASC  Take 1 tablet (5 mg total) by mouth daily.     aspirin EC 81 MG tablet  Take 81 mg by mouth daily.     ciprofloxacin 500 MG tablet  Commonly known as:  CIPRO  Take 1 tablet (500 mg total) by mouth 2 (two) times daily.     COMBIGAN 0.2-0.5 % ophthalmic solution  Generic drug:  brimonidine-timolol  Place 1 drop into both eyes every 12 (twelve) hours.     finasteride 5 MG tablet  Commonly known as:  [name]  Take 1 tablet (5 mg total) by mouth daily.     HYDROcodone-acetaminophen 5-325 MG per tablet  Commonly known as:  NORCO/VICODIN  Take 1-2 tablets by mouth every 6 (six) hours as needed.     lactulose 10 GM/15ML solution  Commonly known as:  CHRONULAC  Take 10 g by mouth at bedtime.     metroNIDAZOLE 500 MG tablet  Commonly known as:  FLAGYL  Take 1 tablet (500 mg total) by mouth 3 (three) times daily.  omeprazole 20 MG capsule  Commonly known as:  PRILOSEC  Take 1 capsule (20 mg total) by mouth daily.     polyethylene glycol packet  Commonly known as:  MIRALAX / GLYCOLAX  Take 17 g by mouth daily.     simvastatin 20 MG tablet  Commonly known as:  ZOCOR  Take 20 mg by mouth daily.     tamsulosin 0.4 MG Caps  Commonly known as:  FLOMAX  Take 0.4 mg by mouth daily after breakfast.         Follow-up Information   Follow up with HOXWORTH,BENJAMIN T, MD. Schedule an appointment as  soon as possible for a visit in 2 weeks.   Contact information:   [address] [name] [address] [phone]       Schedule an appointment as soon as possible for a visit with Jethro Bolus I, MD. (for Cystoscopy to evaluate your bladder/prostate)    Contact information:   [address] [place] [address] [phone]       Signed: [facility] [phone]  11/17/2012, 9:44 AM

## 2012-11-17 NOTE — Discharge Summary (Signed)
Discussed with MD,PA. Patient had left before I came by to see him

## 2012-11-19 ENCOUNTER — Telehealth (INDEPENDENT_AMBULATORY_CARE_PROVIDER_SITE_OTHER): Payer: Self-pay

## 2012-11-19 NOTE — Telephone Encounter (Signed)
Message copied by Maryan Puls on Wed Nov 19, 2012 11:18 AM ------      Message from: Ivory Broad      Created: Mon Nov 17, 2012  9:52 AM                   ----- Message -----         From: Aris Georgia, PA-C         Sent: 11/17/2012   9:38 AM           To: [facility] Clinical Pool            Please schedule with Dr. Johna Sheriff in 2 weeks for f/u from non- operative management of appendicitis with abscess             ------

## 2012-11-19 NOTE — Telephone Encounter (Signed)
Rec'd message from front desk to call patient wife Talbert [place]) called and no answer, left message with appt date & time for 12/12/12 @ 2:40 pm w/Dr. Johna Sheriff.

## 2012-11-19 NOTE — Telephone Encounter (Signed)
Called and left message for patient with follow up appointment date & time for 12/12/12 @ 2:40 pm w/Dr. Johna Sheriff.

## 2012-12-12 ENCOUNTER — Ambulatory Visit (INDEPENDENT_AMBULATORY_CARE_PROVIDER_SITE_OTHER): Payer: Medicare Other | Admitting: General Surgery

## 2012-12-12 ENCOUNTER — Encounter (INDEPENDENT_AMBULATORY_CARE_PROVIDER_SITE_OTHER): Payer: Self-pay | Admitting: General Surgery

## 2012-12-12 VITALS — BP 138/76 | HR 82 | Temp 97.2°F | Resp 16 | Ht 67.0 in | Wt 172.8 lb

## 2012-12-12 DIAGNOSIS — K3533 Acute appendicitis with perforation and localized peritonitis, with abscess: Secondary | ICD-10-CM

## 2012-12-12 NOTE — Patient Instructions (Signed)
Call for any worsening abdominal pain or fever or other concerns. We will see you in 3 weeks.

## 2012-12-12 NOTE — Progress Notes (Signed)
Chief complaint: Followup hospitalization for recent appendicitis  History: Patient returns to the office. He was hospitalized just over 3 weeks ago with acute right lower quadrant pain that had been present for about one week. CT scan which again reviewed today shows an apparent appendicitis with phlegmon and small abscess around the tip of the appendix. The patient was treated nonoperatively with IV antibiotics and improved. He received about 10 days of oral antibiotics after discharge which he is finished. At this point he states he feels well. Denies any abdominal pain or fever or malaise. He has some chronic constipation is unchanged. He had a colonoscopy within the last year.  Exam: BP 138/76  Pulse 82  Temp(Src) 97.2 F (36.2 C) (Temporal)  Resp 16  Ht 5\' 7"  (1.702 m)  Wt 172 lb 12.8 oz (78.382 kg)  BMI 27.06 kg/m2 General: Appears well Abdomen: Nondistended. There is mild right lower quadrant tenderness but no guarding or mass.  Assessment plan: Recent episode of perforated appendicitis with phlegmon. He clinically continues to improve. He still however have some tenderness. Although it seemed back in 3 weeks for recheck. I would repeat his CT scan if he gets worse or if her symptoms do not resolve by then. We discussed options of interval appendectomy versus watchful waiting and he would prefer not to have surgery. Return in 3 weeks.

## 2012-12-23 ENCOUNTER — Other Ambulatory Visit: Payer: Self-pay | Admitting: Family Medicine

## 2012-12-24 ENCOUNTER — Telehealth (INDEPENDENT_AMBULATORY_CARE_PROVIDER_SITE_OTHER): Payer: Self-pay

## 2012-12-24 NOTE — Telephone Encounter (Signed)
Left message for patient to see if I can move his appt time to 11:45 am with arrival at 11:30 on 12/25/12 w/Dr. Johna Sheriff

## 2012-12-25 ENCOUNTER — Ambulatory Visit (INDEPENDENT_AMBULATORY_CARE_PROVIDER_SITE_OTHER): Payer: Medicare Other | Admitting: General Surgery

## 2012-12-25 ENCOUNTER — Encounter (INDEPENDENT_AMBULATORY_CARE_PROVIDER_SITE_OTHER): Payer: Self-pay | Admitting: General Surgery

## 2012-12-25 VITALS — BP 124/72 | HR 68 | Temp 97.6°F | Resp 16 | Ht 67.0 in | Wt 170.8 lb

## 2012-12-25 DIAGNOSIS — K3533 Acute appendicitis with perforation and localized peritonitis, with abscess: Secondary | ICD-10-CM

## 2012-12-25 NOTE — Patient Instructions (Signed)
Please call for any worsening abdominal discomfort, fever or any other symptoms of concern.

## 2012-12-25 NOTE — Progress Notes (Signed)
History: Patient returns for more long-term followup after a hospitalization for perforated appendicitis with a phlegmon and small abscess at the tip of the appendix. He was treated nonoperatively and discharged improving. At his last visit 3 weeks ago he had no symptoms but still had some tenderness in the right lower quadrant. He returns today stating he feels absolutely well. He denies any abdominal pain, fever, GI upset, fatigue or other concerns.  Exam: BP 124/72  Pulse 68  Temp(Src) 97.6 F (36.4 C) (Temporal)  Resp 16  Ht 5\' 7"  (1.702 m)  Wt 170 lb 12.8 oz (77.474 kg)  BMI 26.74 kg/m2 General: Well-appearing no distress Abdomen: Soft and without tenderness mass or fullness.  Assessment and plan: Status post nonoperative treatment of perforation at the tip of the appendix. He is now asymptomatic with a normal exam. We discussed options of interval appendectomy to prevent recurrence versus expectant management. He strongly desires not to have surgery. We discussed the very small chance that there is a misdiagnosis such as a tumor the appendix. This seems unlikely based on his imaging. This colonoscopy is up-to-date. He had some bladder thickening noted on the CT but has seen his urologist just recently. He therefore be discharged return as needed. He is counseled to call for any increasing abdominal discomfort, GI symptoms or other concerns.

## 2012-12-30 HISTORY — PX: APPENDECTOMY: SHX54

## 2013-01-24 ENCOUNTER — Emergency Department (HOSPITAL_BASED_OUTPATIENT_CLINIC_OR_DEPARTMENT_OTHER): Payer: Medicare Other

## 2013-01-24 ENCOUNTER — Encounter (HOSPITAL_COMMUNITY): Payer: Self-pay

## 2013-01-24 ENCOUNTER — Inpatient Hospital Stay (HOSPITAL_BASED_OUTPATIENT_CLINIC_OR_DEPARTMENT_OTHER)
Admission: EM | Admit: 2013-01-24 | Discharge: 2013-01-30 | DRG: 339 | Disposition: A | Discharge status: Home / Self Care | Payer: Medicare Other | Attending: General Surgery | Admitting: General Surgery

## 2013-01-24 DIAGNOSIS — G4733 Obstructive sleep apnea (adult) (pediatric): Secondary | ICD-10-CM | POA: Diagnosis present

## 2013-01-24 DIAGNOSIS — N400 Enlarged prostate without lower urinary tract symptoms: Secondary | ICD-10-CM | POA: Diagnosis present

## 2013-01-24 DIAGNOSIS — Z87891 Personal history of nicotine dependence: Secondary | ICD-10-CM

## 2013-01-24 DIAGNOSIS — K36 Other appendicitis: Secondary | ICD-10-CM

## 2013-01-24 DIAGNOSIS — K219 Gastro-esophageal reflux disease without esophagitis: Secondary | ICD-10-CM | POA: Diagnosis present

## 2013-01-24 DIAGNOSIS — I10 Essential (primary) hypertension: Secondary | ICD-10-CM | POA: Diagnosis present

## 2013-01-24 DIAGNOSIS — K3533 Acute appendicitis with perforation and localized peritonitis, with abscess: Principal | ICD-10-CM | POA: Diagnosis present

## 2013-01-24 DIAGNOSIS — K560 Paralytic ileus: Secondary | ICD-10-CM | POA: Diagnosis not present

## 2013-01-24 DIAGNOSIS — K5900 Constipation, unspecified: Secondary | ICD-10-CM | POA: Diagnosis present

## 2013-01-24 DIAGNOSIS — K37 Unspecified appendicitis: Secondary | ICD-10-CM

## 2013-01-24 DIAGNOSIS — E785 Hyperlipidemia, unspecified: Secondary | ICD-10-CM | POA: Diagnosis present

## 2013-01-24 LAB — CBC WITH DIFFERENTIAL/PLATELET
Basophils Relative: 0 % (ref 0–1)
Eosinophils Absolute: 0 10*3/uL (ref 0.0–0.7)
Eosinophils Relative: 0 % (ref 0–5)
Hemoglobin: 14.5 g/dL (ref 13.0–17.0)
Lymphs Abs: 1.1 10*3/uL (ref 0.7–4.0)
MCH: 28.9 pg (ref 26.0–34.0)
MCHC: 33.6 g/dL (ref 30.0–36.0)
MCV: 86 fL (ref 78.0–100.0)
Monocytes Relative: 8 % (ref 3–12)
Platelets: 257 10*3/uL (ref 150–400)
RBC: 5.01 MIL/uL (ref 4.22–5.81)

## 2013-01-24 LAB — URINALYSIS, ROUTINE W REFLEX MICROSCOPIC
Ketones, ur: NEGATIVE mg/dL
Leukocytes, UA: NEGATIVE
Nitrite: NEGATIVE
Specific Gravity, Urine: 1.027 (ref 1.005–1.030)
pH: 6 (ref 5.0–8.0)

## 2013-01-24 LAB — COMPREHENSIVE METABOLIC PANEL
BUN: 20 mg/dL (ref 6–23)
Calcium: 10.1 mg/dL (ref 8.4–10.5)
GFR calc Af Amer: 68 mL/min — ABNORMAL LOW (ref >90)
Glucose, Bld: 128 mg/dL — ABNORMAL HIGH (ref 70–99)
Total Protein: 7.6 g/dL (ref 6.0–8.3)

## 2013-01-24 MED ORDER — BRIMONIDINE TARTRATE 0.2 % OP SOLN
1.0000 [drp] | Freq: Two times a day (BID) | OPHTHALMIC | Status: DC
  Administered 2013-01-25 – 2013-01-30 (×10): 1 [drp] via OPHTHALMIC
  Filled 2013-01-24 (×2): qty 5

## 2013-01-24 MED ORDER — FINASTERIDE 5 MG PO TABS
5.0000 mg | ORAL_TABLET | Freq: Every morning | ORAL | Status: DC
  Administered 2013-01-25 – 2013-01-30 (×5): 5 mg via ORAL
  Filled 2013-01-24 (×6): qty 1

## 2013-01-24 MED ORDER — SODIUM CHLORIDE 0.9 % IV SOLN
3.0000 g | Freq: Once | INTRAVENOUS | Status: AC
  Administered 2013-01-24: 3 g via INTRAVENOUS
  Filled 2013-01-24: qty 3

## 2013-01-24 MED ORDER — HYDROMORPHONE HCL PF 1 MG/ML IJ SOLN
1.0000 mg | INTRAMUSCULAR | Status: DC | PRN
  Administered 2013-01-25 – 2013-01-28 (×12): 1 mg via INTRAVENOUS
  Filled 2013-01-24 (×14): qty 1

## 2013-01-24 MED ORDER — ONDANSETRON HCL 4 MG/2ML IJ SOLN: 4.0000 mg | Freq: Four times a day (QID) | INTRAMUSCULAR | Status: DC | PRN

## 2013-01-24 MED ORDER — IOHEXOL 300 MG/ML  SOLN
50.0000 mL | Freq: Once | INTRAMUSCULAR | Status: AC | PRN
  Administered 2013-01-24: 50 mL via ORAL

## 2013-01-24 MED ORDER — HYDROCODONE-ACETAMINOPHEN 5-325 MG PO TABS
1.0000 | ORAL_TABLET | ORAL | Status: DC | PRN
  Administered 2013-01-25 – 2013-01-26 (×4): 2 via ORAL
  Administered 2013-01-28: 1 via ORAL
  Administered 2013-01-28: 2 via ORAL
  Administered 2013-01-29 (×3): 1 via ORAL
  Filled 2013-01-24: qty 2
  Filled 2013-01-24: qty 1
  Filled 2013-01-24 (×2): qty 2
  Filled 2013-01-24: qty 1
  Filled 2013-01-24 (×2): qty 2
  Filled 2013-01-24: qty 1
  Filled 2013-01-24: qty 2
  Filled 2013-01-24 (×2): qty 1

## 2013-01-24 MED ORDER — ONDANSETRON HCL 4 MG/2ML IJ SOLN: 4.0000 mg | Freq: Three times a day (TID) | INTRAMUSCULAR | Status: AC | PRN

## 2013-01-24 MED ORDER — ONDANSETRON HCL 4 MG/2ML IJ SOLN
4.0000 mg | Freq: Once | INTRAMUSCULAR | Status: AC
  Administered 2013-01-24: 4 mg via INTRAVENOUS
  Filled 2013-01-24: qty 2

## 2013-01-24 MED ORDER — SODIUM CHLORIDE 0.9 % IV SOLN
3.0000 g | Freq: Four times a day (QID) | INTRAVENOUS | Status: DC
  Administered 2013-01-25 – 2013-01-30 (×23): 3 g via INTRAVENOUS
  Filled 2013-01-24 (×25): qty 3

## 2013-01-24 MED ORDER — BRIMONIDINE TARTRATE-TIMOLOL 0.2-0.5 % OP SOLN: 1.0000 [drp] | Freq: Two times a day (BID) | OPHTHALMIC | Status: DC

## 2013-01-24 MED ORDER — PANTOPRAZOLE SODIUM 40 MG IV SOLR
40.0000 mg | Freq: Every day | INTRAVENOUS | Status: DC
  Administered 2013-01-25 – 2013-01-29 (×6): 40 mg via INTRAVENOUS
  Filled 2013-01-24 (×7): qty 40

## 2013-01-24 MED ORDER — SODIUM CHLORIDE 0.9 % IV BOLUS (SEPSIS)
1000.0000 mL | Freq: Once | INTRAVENOUS | Status: AC
  Administered 2013-01-24: 1000 mL via INTRAVENOUS

## 2013-01-24 MED ORDER — ACETAMINOPHEN 650 MG RE SUPP: 650.0000 mg | Freq: Four times a day (QID) | RECTAL | Status: DC | PRN

## 2013-01-24 MED ORDER — ACETAMINOPHEN 325 MG PO TABS: 650.0000 mg | ORAL_TABLET | Freq: Four times a day (QID) | ORAL | Status: DC | PRN

## 2013-01-24 MED ORDER — SODIUM CHLORIDE 0.9 % IV SOLN
INTRAVENOUS | Status: AC
  Administered 2013-01-24: 23:00:00 via INTRAVENOUS

## 2013-01-24 MED ORDER — HYDROMORPHONE HCL PF 1 MG/ML IJ SOLN
INTRAMUSCULAR | Status: AC
  Filled 2013-01-24: qty 1

## 2013-01-24 MED ORDER — KCL IN DEXTROSE-NACL 30-5-0.45 MEQ/L-%-% IV SOLN
INTRAVENOUS | Status: DC
  Administered 2013-01-25 – 2013-01-26 (×3): via INTRAVENOUS
  Filled 2013-01-24 (×4): qty 1000

## 2013-01-24 MED ORDER — MORPHINE SULFATE 4 MG/ML IJ SOLN
4.0000 mg | Freq: Once | INTRAMUSCULAR | Status: AC
  Administered 2013-01-24: 4 mg via INTRAVENOUS
  Filled 2013-01-24: qty 1

## 2013-01-24 MED ORDER — IOHEXOL 300 MG/ML  SOLN
100.0000 mL | Freq: Once | INTRAMUSCULAR | Status: AC | PRN
  Administered 2013-01-24: 100 mL via INTRAVENOUS

## 2013-01-24 MED ORDER — HYDROMORPHONE HCL PF 1 MG/ML IJ SOLN
0.5000 mg | Freq: Once | INTRAMUSCULAR | Status: AC
  Administered 2013-01-24: 0.5 mg via INTRAVENOUS

## 2013-01-24 MED ORDER — TIMOLOL MALEATE 0.5 % OP SOLN
1.0000 [drp] | Freq: Two times a day (BID) | OPHTHALMIC | Status: DC
  Administered 2013-01-25 – 2013-01-30 (×10): 1 [drp] via OPHTHALMIC
  Filled 2013-01-24 (×2): qty 5

## 2013-01-24 MED ORDER — AMLODIPINE BESYLATE 5 MG PO TABS
5.0000 mg | ORAL_TABLET | Freq: Every morning | ORAL | Status: DC
  Administered 2013-01-25 – 2013-01-30 (×5): 5 mg via ORAL
  Filled 2013-01-24 (×6): qty 1

## 2013-01-24 MED ORDER — TAMSULOSIN HCL 0.4 MG PO CAPS
0.4000 mg | ORAL_CAPSULE | Freq: Every evening | ORAL | Status: DC
  Administered 2013-01-25 – 2013-01-29 (×6): 0.4 mg via ORAL
  Filled 2013-01-24 (×7): qty 1

## 2013-01-24 NOTE — Progress Notes (Signed)
Dr Gerrit Friends made aware patient is on [address] via answering service

## 2013-01-24 NOTE — ED Notes (Signed)
Pt sts pain in RLQ which started 3 weeks ago; was seen and told if it comes back severely to come back d/t possible ruptured appendix.

## 2013-01-24 NOTE — H&P (Signed)
Alan [name] is an 72 y.o. male.  \  General Surgery [facility]  Chief Complaint:  Abdominal pain, acute on chronic appendicitis  HPI: patient presents to [facility] ER with 24 hour hx of RLQ abdominal pain, chills, and constipation.  Patient had been hospitalized in May 2014 by Dr. Jaclynn Guarneri with acute appendicitis.  He was treated with abx's, no drainage, and on 12/25/2012 office visit decided against interval appendectomy.  Patient now admitted with recurrent appendicitis.  Past Medical History  Diagnosis Date  . Sleep apnea     does not use cpap  . Unspecified essential hypertension   . Hypercholesteremia   . GERD (gastroesophageal reflux disease)   . Diverticulosis of colon (without mention of hemorrhage)   . Irritable bowel syndrome   . Benign neoplasm of colon     colon polyps  . Benign hypertrophy of prostate   . Erectile dysfunction   . Restless legs syndrome (RLS)   . Anxiety state, unspecified   . Onychomycosis     Past Surgical History  Procedure Laterality Date  . None    . Colonoscopy      Family History  Problem Relation Age of Onset  . Colon cancer Paternal Grandfather 87  . Cancer Paternal Grandfather     prostate  . Cancer Father     prostate  . Cancer Brother     prostate   Social History:  reports that he quit smoking about 44 years ago. He has never used smokeless tobacco. He reports that he does not drink alcohol or use illicit drugs.  Allergies:  Allergies  Allergen Reactions  . Codeine Other (See Comments)    REACTION: hallucinations,faint,dizziness    Medications Prior to Admission  Medication Sig Dispense Refill  . amLODipine (NORVASC) 5 MG tablet Take 1 tablet (5 mg total) by mouth daily.  90 tablet  3  . aspirin EC 81 MG tablet Take 81 mg by mouth daily.      . brimonidine-timolol (COMBIGAN) 0.2-0.5 % ophthalmic solution Place 1 drop into both eyes every 12 (twelve) hours.      . finasteride  ([name]) 5 MG tablet Take 1 tablet (5 mg total) by mouth daily.  90 tablet  3  . lactulose (CHRONULAC) 10 GM/15ML solution TAKE 15 MLS BY MOUTH TWICE A DAY UNTIL BOWEL MOVEMENT, AND STOP IF YOU HAVE DIARRHEA  240 mL  0  . omeprazole (PRILOSEC) 20 MG capsule Take 1 capsule (20 mg total) by mouth daily.  90 capsule  3  . polyethylene glycol (MIRALAX / GLYCOLAX) packet Take 17 g by mouth daily.      . simvastatin (ZOCOR) 20 MG tablet Take 20 mg by mouth daily.      . Tamsulosin HCl (FLOMAX) 0.4 MG CAPS Take 0.4 mg by mouth daily after breakfast.         Results for orders placed during the hospital encounter of 01/24/13 (from the past 48 hour(s))  CBC WITH DIFFERENTIAL     Status: Abnormal   Collection Time    01/24/13  5:51 PM      Result Value Range   WBC 12.2 (*) 4.0 - 10.5 K/uL   RBC 5.01  4.22 - 5.81 MIL/uL   Hemoglobin 14.5  13.0 - 17.0 g/dL   HCT 16.1  09.6 - 04.5 %   MCV 86.0  78.0 - 100.0 fL   MCH 28.9  26.0 - 34.0 pg  MCHC 33.6  30.0 - 36.0 g/dL   RDW 16.1  09.6 - 04.5 %   Platelets 257  150 - 400 K/uL   Neutrophils Relative % 83 (*) 43 - 77 %   Neutro Abs 10.1 (*) 1.7 - 7.7 K/uL   Lymphocytes Relative 9 (*) 12 - 46 %   Lymphs Abs 1.1  0.7 - 4.0 K/uL   Monocytes Relative 8  3 - 12 %   Monocytes Absolute 1.0  0.1 - 1.0 K/uL   Eosinophils Relative 0  0 - 5 %   Eosinophils Absolute 0.0  0.0 - 0.7 K/uL   Basophils Relative 0  0 - 1 %   Basophils Absolute 0.0  0.0 - 0.1 K/uL  COMPREHENSIVE METABOLIC PANEL     Status: Abnormal   Collection Time    01/24/13  5:51 PM      Result Value Range   Sodium 137  135 - 145 mEq/L   Potassium 3.8  3.5 - 5.1 mEq/L   Chloride 100  96 - 112 mEq/L   CO2 28  19 - 32 mEq/L   Glucose, Bld 128 (*) 70 - 99 mg/dL   BUN 20  6 - 23 mg/dL   Creatinine, Ser 4.09  0.50 - 1.35 mg/dL   Calcium 81.1  8.4 - 91.4 mg/dL   Total Protein 7.6  6.0 - 8.3 g/dL   Albumin 3.7  3.5 - 5.2 g/dL   AST 11  0 - 37 U/L   ALT 15  0 - 53 U/L   Alkaline Phosphatase  58  39 - 117 U/L   Total Bilirubin 1.0  0.3 - 1.2 mg/dL   GFR calc non Af Amer 59 (*) >90 mL/min   GFR calc Af Amer 68 (*) >90 mL/min   Comment:            The eGFR has been calculated     using the CKD EPI equation.     This calculation has not been     validated in all clinical     situations.     eGFR's persistently     <90 mL/min signify     possible Chronic Kidney Disease.  URINALYSIS, ROUTINE W REFLEX MICROSCOPIC     Status: Abnormal   Collection Time    01/24/13  5:56 PM      Result Value Range   Color, Urine AMBER (*) YELLOW   Comment: BIOCHEMICALS MAY BE AFFECTED BY COLOR   APPearance CLEAR  CLEAR   Specific Gravity, Urine 1.027  1.005 - 1.030   pH 6.0  5.0 - 8.0   Glucose, UA NEGATIVE  NEGATIVE mg/dL   Hgb urine dipstick NEGATIVE  NEGATIVE   Bilirubin Urine NEGATIVE  NEGATIVE   Ketones, ur NEGATIVE  NEGATIVE mg/dL   Protein, ur NEGATIVE  NEGATIVE mg/dL   Urobilinogen, UA 0.2  0.0 - 1.0 mg/dL   Nitrite NEGATIVE  NEGATIVE   Leukocytes, UA NEGATIVE  NEGATIVE   Comment: MICROSCOPIC NOT DONE ON URINES WITH NEGATIVE PROTEIN, BLOOD, LEUKOCYTES, NITRITE, OR GLUCOSE <1000 mg/dL.   Ct Abdomen Pelvis W Contrast  01/24/2013   **ADDENDUM** CREATED: 01/24/2013 20:49:54  These results were called by telephone on 01/24/2013 at 2050 hours to Dr. Rolan Bucco, who verbally acknowledged these results.  **END ADDENDUM** SIGNED BY: Charline Bills, M.D.  01/24/2013   *RADIOLOGY REPORT*  Clinical Data: Right lower quadrant pain, history of perforated appendix, no surgical intervention  CT ABDOMEN AND PELVIS  WITH CONTRAST  Technique:  Multidetector CT imaging of the abdomen and pelvis was performed following the standard protocol during bolus administration of intravenous contrast.  Contrast: 100 ml Omnipaque-300 IV  Comparison: [facility] CT abdomen pelvis dated 11/12/2012  Findings: Plate-like scarring versus atelectasis in the left lower lobe (series 6/image 14).  Liver, spleen, pancreas,  and right adrenal gland are within normal limits.  Stable mild thickening of the left adrenal gland without discrete nodule.  Gallbladder is unremarkable.  No intrahepatic or extrahepatic ductal dilatation.  10 mm right upper pole renal cyst.  12 mm lateral interpolar left renal cyst.  17 mm right lower pole renal cyst.  No hydronephrosis.  No evidence of bowel obstruction.  Again noted is an inflammatory process in the right lower quadrant with inflammatory stranding, mildly progressed, favored to reflect acute on chronic perforated appendicitis.  Associated 1.3 x 1.5 x 3.9 mm periappendiceal fluid collection (series 4/image 60; coronal image 41), likely too small for percutaneous drainage.  No free air.  Atherosclerotic calcifications of the abdominal aorta and branch vessels.  No abdominopelvic ascites.  No suspicious abdominopelvic lymphadenopathy.  Prostatomegaly, measuring 5.2 cm in transverse dimension, with nodular enlargement of the central gland which indents the base of the bladder (series 4/image 76).  Bladder is mildly thick-walled but otherwise unremarkable.  Mild degenerative changes of the visualized thoracolumbar spine.  IMPRESSION:  Suspected acute on chronic perforated appendicitis, mildly progressed.  Associated 1.3 x 1.5 x 3.9 mm periappendiceal fluid collection, likely too small for percutaneous drainage.  No free air.  Original Report Authenticated By: Charline Bills, M.D.    Review of Systems  Constitutional: Positive for chills. Negative for fever.  HENT: Negative.   Eyes: Negative.   Respiratory: Negative.   Cardiovascular: Negative.   Gastrointestinal: Positive for nausea, abdominal pain and constipation. Negative for vomiting, blood in stool and melena.  Genitourinary: Negative.   Musculoskeletal: Negative.   Skin: Negative.   Neurological: Negative.   Endo/Heme/Allergies: Negative.   Psychiatric/Behavioral: Negative.     Blood pressure 126/82, pulse 92, temperature  98.2 F (36.8 C), temperature source Oral, resp. rate 18, height 5\' 7"  (1.702 m), weight 170 lb (77.111 kg), SpO2 98.00%. Physical Exam  Constitutional: He is oriented to person, place, and time. He appears well-developed and well-nourished. No distress.  HENT:  Head: Normocephalic and atraumatic.  Right Ear: External ear normal.  Left Ear: External ear normal.  Mouth/Throat: Oropharynx is clear and moist.  Eyes: Conjunctivae are normal. Pupils are equal, round, and reactive to light. No scleral icterus.  Neck: Normal range of motion. Neck supple. No thyromegaly present.  Cardiovascular: Normal rate, regular rhythm and normal heart sounds.   No murmur heard. Respiratory: Effort normal and breath sounds normal. He has no wheezes.  GI: He exhibits distension. He exhibits no mass. There is tenderness. There is guarding. There is no rebound.    Musculoskeletal: Normal range of motion. He exhibits no edema.  Lymphadenopathy:    He has no cervical adenopathy.  Neurological: He is alert and oriented to person, place, and time.  Skin: Skin is warm and dry.  Psychiatric: He has a normal mood and affect. His behavior is normal.     Assessment/Plan Acute on chronic appendicitis  CT scan shows acute inflammatory changes, possible small fluid collection, no perforation or free air.  Will start IV abx, allow clear liquids, and continue home meds.  Will monitor exam and WBC.  Will probably repeat CT abdomen in  3-4 days.  If improvement, consider proceeding with appendectomy or possibly ileocecectomy (discussed with patient and family).  If fluid collection develops into an abscess, may require percutaneous drainage.  Patient and family understand this plan and agree to proceed.  Velora Heckler, MD, [facility] Office: [phone]    Emine Lopata Judie Petit 01/24/2013, 11:31 PM

## 2013-01-24 NOTE — ED Notes (Addendum)
Report called to [facility], Departed via [facility] stable

## 2013-01-24 NOTE — ED Provider Notes (Signed)
CSN: [id number]     Arrival date & time 01/24/13  1707 History    This chart was scribed for Rolan Bucco, MD, by Frederik Pear, ED scribe. The patient was seen in room MH06/MH06 and the patient's care was started at 1738.    First MD Initiated Contact with Patient 01/24/13 1738     Chief Complaint  Patient presents with  . Abdominal Pain   (Consider location/radiation/quality/duration/timing/severity/associated sxs/prior Treatment) The history is provided by the patient and medical records. No language interpreter was used.   HPI Comments: Alan [name] is a 72 y.o. male with a h/o of appendicitis who presents to the Emergency Department complaining of 8-9/10, intermittent RLQ that began last night after taking a laxative to relieve his chronic constipation. He reports his appendix ruptured 3 week ago (on chart review, was on 5/14), and he was seen by Dr. Maricela Bo who gave him a course of antibiotics and pain control mediation. He denies an appendectomy. This was treated nonoperatively.  He denies any pain since then until last night. In the ED, he denies current pain. He denies emesis, nausea, diarrhea, difficulty urinating, and fever. He was seen by [facility] after the gradual onset of the pain initially began 9 weeks ago. An performed an X-ray and noticed a mass that was confirmed by his PCP's office. He was admitted on 05/15 for perforated appendicitis.   Surgeon is Dr. Maricela Bo. PCP is Dr. Kriste Basque.   Past Medical History  Diagnosis Date  . Sleep apnea     does not use cpap  . Unspecified essential hypertension   . Hypercholesteremia   . GERD (gastroesophageal reflux disease)   . Diverticulosis of colon (without mention of hemorrhage)   . Irritable bowel syndrome   . Benign neoplasm of colon     colon polyps  . Benign hypertrophy of prostate   . Erectile dysfunction   . Restless legs syndrome (RLS)   . Anxiety state, unspecified   . Onychomycosis    Past Surgical History   Procedure Laterality Date  . None    . Colonoscopy     Family History  Problem Relation Age of Onset  . Colon cancer Paternal Grandfather 43  . Cancer Paternal Grandfather     prostate  . Cancer Father     prostate  . Cancer Brother     prostate   History  Substance Use Topics  . Smoking status: Former Smoker    Quit date: 07/02/1968  . Smokeless tobacco: Never Used  . Alcohol Use: No    Review of Systems  Constitutional: Negative for fever, chills, diaphoresis and fatigue.  HENT: Negative for congestion, rhinorrhea and sneezing.   Eyes: Negative.   Respiratory: Negative for cough, chest tightness and shortness of breath.   Cardiovascular: Negative for chest pain and leg swelling.  Gastrointestinal: Positive for abdominal pain. Negative for nausea, vomiting, diarrhea and blood in stool.  Genitourinary: Negative for frequency, hematuria, flank pain and difficulty urinating.  Musculoskeletal: Negative for back pain and arthralgias.  Skin: Negative for rash.  Neurological: Negative for dizziness, speech difficulty, weakness, numbness and headaches.    Allergies  Codeine  Home Medications   Current Outpatient Rx  Name  Route  Sig  Dispense  Refill  . amLODipine (NORVASC) 5 MG tablet   Oral   Take 1 tablet (5 mg total) by mouth daily.   90 tablet   3   . aspirin EC 81 MG tablet   Oral  Take 81 mg by mouth daily.         . brimonidine-timolol (COMBIGAN) 0.2-0.5 % ophthalmic solution   Both Eyes   Place 1 drop into both eyes every 12 (twelve) hours.         . finasteride ([name]) 5 MG tablet   Oral   Take 1 tablet (5 mg total) by mouth daily.   90 tablet   3   . lactulose (CHRONULAC) 10 GM/15ML solution      TAKE 15 MLS BY MOUTH TWICE A DAY UNTIL BOWEL MOVEMENT, AND STOP IF YOU HAVE DIARRHEA   240 mL   0   . omeprazole (PRILOSEC) 20 MG capsule   Oral   Take 1 capsule (20 mg total) by mouth daily.   90 capsule   3   . polyethylene glycol  (MIRALAX / GLYCOLAX) packet   Oral   Take 17 g by mouth daily.         . simvastatin (ZOCOR) 20 MG tablet   Oral   Take 20 mg by mouth daily.         . Tamsulosin HCl (FLOMAX) 0.4 MG CAPS   Oral   Take 0.4 mg by mouth daily after breakfast.           BP 112/73  Pulse 79  Temp(Src) 98.9 F (37.2 C) (Oral)  Resp 16  Ht 5\' 7"  (1.702 m)  Wt 170 lb (77.111 kg)  BMI 26.62 kg/m2  SpO2 95% Physical Exam  Nursing note and vitals reviewed. Constitutional: He is oriented to person, place, and time. He appears well-developed and well-nourished.  HENT:  Head: Normocephalic and atraumatic.  Eyes: Pupils are equal, round, and reactive to light.  Neck: Normal range of motion. Neck supple.  Cardiovascular: Normal rate, regular rhythm and normal heart sounds.   Pulmonary/Chest: Effort normal and breath sounds normal. No respiratory distress. He has no wheezes. He has no rales. He exhibits no tenderness.  Abdominal: Soft. Bowel sounds are normal. There is tenderness. There is guarding. There is no rebound.  Marked RLQ tenderness with guarding.   Musculoskeletal: Normal range of motion. He exhibits no edema.  Lymphadenopathy:    He has no cervical adenopathy.  Neurological: He is alert and oriented to person, place, and time.  Skin: Skin is warm and dry. No rash noted.  Psychiatric: He has a normal mood and affect.    ED Course   Procedures (including critical care time)  DIAGNOSTIC STUDIES: Oxygen Saturation is 95% on room air, adequate by my interpretation.    COORDINATION OF CARE:  18:10- Discussed planned course of treatment with the patient, including a consult with Dr. Gerrit Friends in surgery, who is agreeable at this time.  18:45- Ordered Unasyn and abdominopelvic CT after consult with Dr. Gerrit Friends.  20:45- Nursing staff reports the pt is complaining of pain. Will order pain control medication.  Results for orders placed during the hospital encounter of 01/24/13  CBC WITH  DIFFERENTIAL      Result Value Range   WBC 12.2 (*) 4.0 - 10.5 K/uL   RBC 5.01  4.22 - 5.81 MIL/uL   Hemoglobin 14.5  13.0 - 17.0 g/dL   HCT 16.1  09.6 - 04.5 %   MCV 86.0  78.0 - 100.0 fL   MCH 28.9  26.0 - 34.0 pg   MCHC 33.6  30.0 - 36.0 g/dL   RDW 40.9  81.1 - 91.4 %   Platelets 257  150 - 400  K/uL   Neutrophils Relative % 83 (*) 43 - 77 %   Neutro Abs 10.1 (*) 1.7 - 7.7 K/uL   Lymphocytes Relative 9 (*) 12 - 46 %   Lymphs Abs 1.1  0.7 - 4.0 K/uL   Monocytes Relative 8  3 - 12 %   Monocytes Absolute 1.0  0.1 - 1.0 K/uL   Eosinophils Relative 0  0 - 5 %   Eosinophils Absolute 0.0  0.0 - 0.7 K/uL   Basophils Relative 0  0 - 1 %   Basophils Absolute 0.0  0.0 - 0.1 K/uL  COMPREHENSIVE METABOLIC PANEL      Result Value Range   Sodium 137  135 - 145 mEq/L   Potassium 3.8  3.5 - 5.1 mEq/L   Chloride 100  96 - 112 mEq/L   CO2 28  19 - 32 mEq/L   Glucose, Bld 128 (*) 70 - 99 mg/dL   BUN 20  6 - 23 mg/dL   Creatinine, Ser 8.65  0.50 - 1.35 mg/dL   Calcium 78.4  8.4 - 69.6 mg/dL   Total Protein 7.6  6.0 - 8.3 g/dL   Albumin 3.7  3.5 - 5.2 g/dL   AST 11  0 - 37 U/L   ALT 15  0 - 53 U/L   Alkaline Phosphatase 58  39 - 117 U/L   Total Bilirubin 1.0  0.3 - 1.2 mg/dL   GFR calc non Af Amer 59 (*) >90 mL/min   GFR calc Af Amer 68 (*) >90 mL/min  URINALYSIS, ROUTINE W REFLEX MICROSCOPIC      Result Value Range   Color, Urine AMBER (*) YELLOW   APPearance CLEAR  CLEAR   Specific Gravity, Urine 1.027  1.005 - 1.030   pH 6.0  5.0 - 8.0   Glucose, UA NEGATIVE  NEGATIVE mg/dL   Hgb urine dipstick NEGATIVE  NEGATIVE   Bilirubin Urine NEGATIVE  NEGATIVE   Ketones, ur NEGATIVE  NEGATIVE mg/dL   Protein, ur NEGATIVE  NEGATIVE mg/dL   Urobilinogen, UA 0.2  0.0 - 1.0 mg/dL   Nitrite NEGATIVE  NEGATIVE   Leukocytes, UA NEGATIVE  NEGATIVE   Labs Reviewed  CBC WITH DIFFERENTIAL - Abnormal; Notable for the following:    WBC 12.2 (*)    Neutrophils Relative % 83 (*)    Neutro Abs 10.1 (*)     Lymphocytes Relative 9 (*)    All other components within normal limits  COMPREHENSIVE METABOLIC PANEL - Abnormal; Notable for the following:    Glucose, Bld 128 (*)    GFR calc non Af Amer 59 (*)    GFR calc Af Amer 68 (*)    All other components within normal limits  URINALYSIS, ROUTINE W REFLEX MICROSCOPIC - Abnormal; Notable for the following:    Color, Urine AMBER (*)    All other components within normal limits   Ct Abdomen Pelvis W Contrast  01/24/2013   *RADIOLOGY REPORT*  Clinical Data: Right lower quadrant pain, history of perforated appendix, no surgical intervention  CT ABDOMEN AND PELVIS WITH CONTRAST  Technique:  Multidetector CT imaging of the abdomen and pelvis was performed following the standard protocol during bolus administration of intravenous contrast.  Contrast: 100 ml Omnipaque-300 IV  Comparison: [facility] CT abdomen pelvis dated 11/12/2012  Findings: Plate-like scarring versus atelectasis in the left lower lobe (series 6/image 14).  Liver, spleen, pancreas, and right adrenal gland are within normal limits.  Stable mild thickening  of the left adrenal gland without discrete nodule.  Gallbladder is unremarkable.  No intrahepatic or extrahepatic ductal dilatation.  10 mm right upper pole renal cyst.  12 mm lateral interpolar left renal cyst.  17 mm right lower pole renal cyst.  No hydronephrosis.  No evidence of bowel obstruction.  Again noted is an inflammatory process in the right lower quadrant with inflammatory stranding, mildly progressed, favored to reflect acute on chronic perforated appendicitis.  Associated 1.3 x 1.5 x 3.9 mm periappendiceal fluid collection (series 4/image 60; coronal image 41), likely too small for percutaneous drainage.  No free air.  Atherosclerotic calcifications of the abdominal aorta and branch vessels.  No abdominopelvic ascites.  No suspicious abdominopelvic lymphadenopathy.  Prostatomegaly, measuring 5.2 cm in transverse dimension, with  nodular enlargement of the central gland which indents the base of the bladder (series 4/image 76).  Bladder is mildly thick-walled but otherwise unremarkable.  Mild degenerative changes of the visualized thoracolumbar spine.  IMPRESSION:  Suspected acute on chronic perforated appendicitis, mildly progressed.  Associated 1.3 x 1.5 x 3.9 mm periappendiceal fluid collection, likely too small for percutaneous drainage.  No free air.   Original Report Authenticated By: Charline Bills, M.D.   No diagnosis found.  MDM  Discussed with DR Gerrit Friends, will check CT, give dose of IV abx.  2100: pt to be transferred to [facility] for admission by Dr Gerrit Friends  I personally performed the services described in this documentation, which was scribed in my presence.  The recorded information has been reviewed and considered.    Rolan Bucco, MD 01/24/13 2101

## 2013-01-25 LAB — BASIC METABOLIC PANEL
Calcium: 8.9 mg/dL (ref 8.4–10.5)
Chloride: 101 mEq/L (ref 96–112)
Creatinine, Ser: 1.07 mg/dL (ref 0.50–1.35)
GFR calc Af Amer: 78 mL/min — ABNORMAL LOW (ref >90)

## 2013-01-25 LAB — CBC
Platelets: 201 10*3/uL (ref 150–400)
RDW: 14.9 % (ref 11.5–15.5)
WBC: 11 10*3/uL — ABNORMAL HIGH (ref 4.0–10.5)

## 2013-01-25 NOTE — Progress Notes (Signed)
Patient ID: Alan [name], male   DOB: 07-21-1940, 72 y.o.   MRN: [id number]  General Surgery - [facility] - Progress Note  HD# 2  Subjective: Patient with moderate pain this AM in RLQ - took pain Rx.  Will ambulate this AM.  [employer] members at bedside.  No nausea overnight.  Objective: Vital signs in last 24 hours: Temp:  [98.1 F (36.7 C)-100.9 F (38.3 C)] 98.5 F (36.9 C) (07/27 0530) Pulse Rate:  [79-92] 80 (07/27 0530) Resp:  [16-18] 18 (07/27 0530) BP: (109-143)/(58-82) 109/65 mmHg (07/27 0530) SpO2:  [90 %-98 %] 96 % (07/27 0530) Weight:  [170 lb (77.111 kg)] 170 lb (77.111 kg) (07/26 1735) Last BM Date: 01/24/13  Intake/Output from previous day: 07/26 0701 - 07/27 0700 In: 2020.8 [I.V.:954.2; IV Piggyback:1066.7] Out: 450 [Urine:450]  Exam: HEENT - clear, not icteric Neck - soft Chest - clear bilaterally Cor - RRR, no murmur Abd - soft, mild distension; moderate tenderness RLQ with guarding Ext - no significant edema Neuro - grossly intact, no focal deficits  Lab Results:   Recent Labs  01/24/13 1751 01/25/13 0422  WBC 12.2* 11.0*  HGB 14.5 13.0  HCT 43.1 39.5  PLT 257 201     Recent Labs  01/24/13 1751 01/25/13 0422  NA 137 134*  K 3.8 3.7  CL 100 101  CO2 28 28  GLUCOSE 128* 129*  BUN 20 16  CREATININE 1.20 1.07  CALCIUM 10.1 8.9    Studies/Results: Ct Abdomen Pelvis W Contrast  01/24/2013   **ADDENDUM** CREATED: 01/24/2013 20:49:54  These results were called by telephone on 01/24/2013 at 2050 hours to Dr. Rolan Bucco, who verbally acknowledged these results.  **END ADDENDUM** SIGNED BY: Charline Bills, M.D.  01/24/2013   *RADIOLOGY REPORT*  Clinical Data: Right lower quadrant pain, history of perforated appendix, no surgical intervention  CT ABDOMEN AND PELVIS WITH CONTRAST  Technique:  Multidetector CT imaging of the abdomen and pelvis was performed following the standard protocol during bolus administration of  intravenous contrast.  Contrast: 100 ml Omnipaque-300 IV  Comparison: [facility] CT abdomen pelvis dated 11/12/2012  Findings: Plate-like scarring versus atelectasis in the left lower lobe (series 6/image 14).  Liver, spleen, pancreas, and right adrenal gland are within normal limits.  Stable mild thickening of the left adrenal gland without discrete nodule.  Gallbladder is unremarkable.  No intrahepatic or extrahepatic ductal dilatation.  10 mm right upper pole renal cyst.  12 mm lateral interpolar left renal cyst.  17 mm right lower pole renal cyst.  No hydronephrosis.  No evidence of bowel obstruction.  Again noted is an inflammatory process in the right lower quadrant with inflammatory stranding, mildly progressed, favored to reflect acute on chronic perforated appendicitis.  Associated 1.3 x 1.5 x 3.9 mm periappendiceal fluid collection (series 4/image 60; coronal image 41), likely too small for percutaneous drainage.  No free air.  Atherosclerotic calcifications of the abdominal aorta and branch vessels.  No abdominopelvic ascites.  No suspicious abdominopelvic lymphadenopathy.  Prostatomegaly, measuring 5.2 cm in transverse dimension, with nodular enlargement of the central gland which indents the base of the bladder (series 4/image 76).  Bladder is mildly thick-walled but otherwise unremarkable.  Mild degenerative changes of the visualized thoracolumbar spine.  IMPRESSION:  Suspected acute on chronic perforated appendicitis, mildly progressed.  Associated 1.3 x 1.5 x 3.9 mm periappendiceal fluid collection, likely too small for percutaneous drainage.  No free air.  Original Report Authenticated By:  Charline Bills, M.D.    Assessment / Plan: 1.  Acute / chronic appendicitis  IV Unasyn started  Pain Rx  Ambulate  Likely repeat CT scan 1-2 days - decision for either drainage vs laparotomy to be made at that time  Velora Heckler, MD, [facility] Office:  [phone]  01/25/2013

## 2013-01-26 LAB — BASIC METABOLIC PANEL
CO2: 26 mEq/L (ref 19–32)
Chloride: 103 mEq/L (ref 96–112)
Glucose, Bld: 107 mg/dL — ABNORMAL HIGH (ref 70–99)
Potassium: 3.8 mEq/L (ref 3.5–5.1)
Sodium: 135 mEq/L (ref 135–145)

## 2013-01-26 LAB — CBC
Hemoglobin: 12.5 g/dL — ABNORMAL LOW (ref 13.0–17.0)
RBC: 4.32 MIL/uL (ref 4.22–5.81)

## 2013-01-26 MED ORDER — SODIUM CHLORIDE 0.9 % IV SOLN: INTRAVENOUS | Status: DC

## 2013-01-26 MED ORDER — HEPARIN SODIUM (PORCINE) 5000 UNIT/ML IJ SOLN
5000.0000 [IU] | Freq: Three times a day (TID) | INTRAMUSCULAR | Status: DC
  Administered 2013-01-26 – 2013-01-30 (×11): 5000 [IU] via SUBCUTANEOUS
  Filled 2013-01-26 (×16): qty 1

## 2013-01-26 NOTE — Progress Notes (Addendum)
Subjective: Stable. Alert. Positive. Still has RLQ pain. No bowel movement. No nausea.  WBC down to 8800. Reviewed current CT and prior CT findings as well as history. Colonoscopy up to date. Comorbidities include obstructive sleep apnea, hypertension, hyperlipidemia, GERD,BPH No prior abdominal surgery. Objective: Vital signs in last 24 hours: Temp:  [98.1 F (36.7 C)-98.8 F (37.1 C)] 98.8 F (37.1 C) (07/27 2115) Pulse Rate:  [89-91] 89 (07/27 2115) Resp:  [18] 18 (07/27 2115) BP: (106-123)/(64-76) 106/64 mmHg (07/27 2115) SpO2:  [90 %-93 %] 90 % (07/27 2115) Last BM Date: 01/24/13  Intake/Output from previous day: 07/27 0701 - 07/28 0700 In: 1848.3 [P.O.:90; I.V.:1358.3; IV Piggyback:400] Out: 575 [Urine:575] Intake/Output this shift: Total I/O In: 1848.3 [P.O.:90; I.V.:1358.3; IV Piggyback:400] Out: 275 [Urine:275]  General appearance: alert. Cooperative. Mental status normal. No distress. GI: soft. Nondistended. Tender with guarding right lower quadrant. I do not perceive a mass.  Lab Results:   Recent Labs  01/25/13 0422 01/26/13 0410  WBC 11.0* 8.8  HGB 13.0 12.5*  HCT 39.5 37.2*  PLT 201 200   BMET  Recent Labs  01/25/13 0422 01/26/13 0410  NA 134* 135  K 3.7 3.8  CL 101 103  CO2 28 26  GLUCOSE 129* 107*  BUN 16 13  CREATININE 1.07 1.08  CALCIUM 8.9 8.7   PT/INR No results found for this basename: LABPROT, INR,  in the last 72 hours ABG No results found for this basename: PHART, PCO2, PO2, HCO3,  in the last 72 hours  Studies/Results: Ct Abdomen Pelvis W Contrast  01/24/2013   **ADDENDUM** CREATED: 01/24/2013 20:49:54  These results were called by telephone on 01/24/2013 at 2050 hours to Dr. Rolan Bucco, who verbally acknowledged these results.  **END ADDENDUM** SIGNED BY: Charline Bills, M.D.  01/24/2013   *RADIOLOGY REPORT*  Clinical Data: Right lower quadrant pain, history of perforated appendix, no surgical intervention  CT  ABDOMEN AND PELVIS WITH CONTRAST  Technique:  Multidetector CT imaging of the abdomen and pelvis was performed following the standard protocol during bolus administration of intravenous contrast.  Contrast: 100 ml Omnipaque-300 IV  Comparison: [facility] CT abdomen pelvis dated 11/12/2012  Findings: Plate-like scarring versus atelectasis in the left lower lobe (series 6/image 14).  Liver, spleen, pancreas, and right adrenal gland are within normal limits.  Stable mild thickening of the left adrenal gland without discrete nodule.  Gallbladder is unremarkable.  No intrahepatic or extrahepatic ductal dilatation.  10 mm right upper pole renal cyst.  12 mm lateral interpolar left renal cyst.  17 mm right lower pole renal cyst.  No hydronephrosis.  No evidence of bowel obstruction.  Again noted is an inflammatory process in the right lower quadrant with inflammatory stranding, mildly progressed, favored to reflect acute on chronic perforated appendicitis.  Associated 1.3 x 1.5 x 3.9 mm periappendiceal fluid collection (series 4/image 60; coronal image 41), likely too small for percutaneous drainage.  No free air.  Atherosclerotic calcifications of the abdominal aorta and branch vessels.  No abdominopelvic ascites.  No suspicious abdominopelvic lymphadenopathy.  Prostatomegaly, measuring 5.2 cm in transverse dimension, with nodular enlargement of the central gland which indents the base of the bladder (series 4/image 76).  Bladder is mildly thick-walled but otherwise unremarkable.  Mild degenerative changes of the visualized thoracolumbar spine.  IMPRESSION:  Suspected acute on chronic perforated appendicitis, mildly progressed.  Associated 1.3 x 1.5 x 3.9 mm periappendiceal fluid collection, likely too small for percutaneous drainage.  No free air.  Original Report Authenticated By: Charline Bills, M.D.    Anti-infectives: Anti-infectives   Start     Dose/Rate Route Frequency Ordered Stop   01/25/13 0000   Ampicillin-Sulbactam (UNASYN) 3 g in sodium chloride 0.9 % 100 mL IVPB     3 g 100 mL/hr over 60 Minutes Intravenous Every 6 hours 01/24/13 2347     01/24/13 1845  Ampicillin-Sulbactam (UNASYN) 3 g in sodium chloride 0.9 % 100 mL IVPB     3 g 100 mL/hr over 60 Minutes Intravenous  Once 01/24/13 1839 01/24/13 1954      Assessment/Plan:  Recurrent appendicitis likely. Neoplasia or diverticulitis of the cecum less likely. History medical management for acute appendicitis, with resolution of symptoms. Recurrence of symptoms increase his likelihood that he will need surgery, most likely this admission. Continue clear liquids and IV fluids and antibiotics Will discuss his prior management with Dr. Johna Sheriff. Increased risk that surgical intervention will require open laparotomy, appendectomy or ileocecectomy. This was discussed with patient. He is generally in favor of doing something definitive this time.   VTE prophylaxis   :  [facility] heparin ordered.   LOS: 2 days    Jerline Linzy M. Derrell Lolling, M.D., [facility] General and Minimally invasive Surgery Breast and Colorectal Surgery Office:   [phone] Pager:   [phone]  01/26/2013

## 2013-01-26 NOTE — Progress Notes (Signed)
Patient ID: Alan [name], male   DOB: 24-Jan-1941, 72 y.o.   MRN: [id number]  Patient is well-known to me due to previous episode of perforated appendicitis treated nonoperatively with complete resolution of his symptoms. We have discussed options of surgical and nonsurgical treatment and he had elected observation without surgery. He however recovers and subacute right lower quadrant pain and evidence of recurrent appendicitis with a small periappendiceal abscess. He is stable but has some continued right lower quadrant pain and tenderness. I believe he will need to proceed with appendectomy. He is in agreement. I recommended proceeding with laparoscopic exploration with appendectomy and possible but he may very well require ileocecectomy. We discussed the indications for the surgery and risks of anesthetic complications, bleeding, infection, anastomotic leak and possible need for open procedure. All his questions were answered. We will schedule for tomorrow.  Mariella Saa MD, FACS  01/26/2013, 10:23 AM

## 2013-01-27 ENCOUNTER — Inpatient Hospital Stay (HOSPITAL_COMMUNITY): Payer: Medicare Other | Admitting: Anesthesiology

## 2013-01-27 ENCOUNTER — Encounter (HOSPITAL_COMMUNITY): Payer: Self-pay | Admitting: Anesthesiology

## 2013-01-27 ENCOUNTER — Encounter (HOSPITAL_COMMUNITY)
Admission: EM | Disposition: A | Discharge status: Home / Self Care | Payer: Self-pay | Source: Home / Self Care | Attending: General Surgery

## 2013-01-27 ENCOUNTER — Encounter (HOSPITAL_COMMUNITY): Payer: Self-pay

## 2013-01-27 HISTORY — PX: LAPAROSCOPIC APPENDECTOMY: SHX408

## 2013-01-27 SURGERY — APPENDECTOMY, LAPAROSCOPIC
Anesthesia: General | Site: Abdomen | Wound class: Dirty or Infected

## 2013-01-27 MED ORDER — ONDANSETRON HCL 4 MG/2ML IJ SOLN
INTRAMUSCULAR | Status: DC | PRN
  Administered 2013-01-27: 4 mg via INTRAVENOUS

## 2013-01-27 MED ORDER — LABETALOL HCL 5 MG/ML IV SOLN
INTRAVENOUS | Status: DC | PRN
  Administered 2013-01-27 (×2): 5 mg via INTRAVENOUS

## 2013-01-27 MED ORDER — LACTATED RINGERS IV SOLN: INTRAVENOUS | Status: DC

## 2013-01-27 MED ORDER — LACTATED RINGERS IR SOLN
Status: DC | PRN
  Administered 2013-01-27: 1000 mL

## 2013-01-27 MED ORDER — GLYCOPYRROLATE 0.2 MG/ML IJ SOLN
INTRAMUSCULAR | Status: DC | PRN
  Administered 2013-01-27: 0.6 mg via INTRAVENOUS

## 2013-01-27 MED ORDER — LACTATED RINGERS IV SOLN
INTRAVENOUS | Status: DC | PRN
  Administered 2013-01-27 (×2): via INTRAVENOUS

## 2013-01-27 MED ORDER — BUPIVACAINE-EPINEPHRINE 0.5% -1:200000 IJ SOLN
INTRAMUSCULAR | Status: DC | PRN
  Administered 2013-01-27: 7 mL

## 2013-01-27 MED ORDER — KCL IN DEXTROSE-NACL 30-5-0.45 MEQ/L-%-% IV SOLN
INTRAVENOUS | Status: DC
  Administered 2013-01-27 – 2013-01-28 (×3): via INTRAVENOUS
  Filled 2013-01-27 (×5): qty 1000

## 2013-01-27 MED ORDER — NEOSTIGMINE METHYLSULFATE 1 MG/ML IJ SOLN
INTRAMUSCULAR | Status: DC | PRN
  Administered 2013-01-27: 4 mg via INTRAVENOUS

## 2013-01-27 MED ORDER — SUCCINYLCHOLINE CHLORIDE 20 MG/ML IJ SOLN
INTRAMUSCULAR | Status: DC | PRN
  Administered 2013-01-27: 100 mg via INTRAVENOUS

## 2013-01-27 MED ORDER — ROCURONIUM BROMIDE 100 MG/10ML IV SOLN
INTRAVENOUS | Status: DC | PRN
  Administered 2013-01-27: 5 mg via INTRAVENOUS
  Administered 2013-01-27: 35 mg via INTRAVENOUS
  Administered 2013-01-27: 10 mg via INTRAVENOUS

## 2013-01-27 MED ORDER — PROPOFOL 10 MG/ML IV BOLUS
INTRAVENOUS | Status: DC | PRN
  Administered 2013-01-27: 160 mg via INTRAVENOUS

## 2013-01-27 MED ORDER — FENTANYL CITRATE 0.05 MG/ML IJ SOLN
INTRAMUSCULAR | Status: DC | PRN
  Administered 2013-01-27 (×2): 50 ug via INTRAVENOUS
  Administered 2013-01-27: 100 ug via INTRAVENOUS

## 2013-01-27 MED ORDER — LIDOCAINE HCL (CARDIAC) 20 MG/ML IV SOLN
INTRAVENOUS | Status: DC | PRN
  Administered 2013-01-27: 50 mg via INTRAVENOUS

## 2013-01-27 MED ORDER — MIDAZOLAM HCL 5 MG/5ML IJ SOLN
INTRAMUSCULAR | Status: DC | PRN
  Administered 2013-01-27 (×2): 1 mg via INTRAVENOUS

## 2013-01-27 MED ORDER — HYDROMORPHONE HCL PF 1 MG/ML IJ SOLN: 0.2500 mg | INTRAMUSCULAR | Status: DC | PRN

## 2013-01-27 MED ORDER — PROMETHAZINE HCL 25 MG/ML IJ SOLN: 6.2500 mg | INTRAMUSCULAR | Status: DC | PRN

## 2013-01-27 MED ORDER — HYDROMORPHONE HCL PF 1 MG/ML IJ SOLN
INTRAMUSCULAR | Status: DC | PRN
  Administered 2013-01-27 (×2): 1 mg via INTRAVENOUS

## 2013-01-27 SURGICAL SUPPLY — 53 items
APPLIER CLIP 5 13 M/L LIGAMAX5 (MISCELLANEOUS)
APPLIER CLIP ROT 10 11.4 M/L (STAPLE)
CANISTER SUCTION 2500CC (MISCELLANEOUS) ×3 IMPLANT
CHLORAPREP W/TINT 26ML (MISCELLANEOUS) ×3 IMPLANT
CLIP APPLIE 5 13 M/L LIGAMAX5 (MISCELLANEOUS) IMPLANT
CLIP APPLIE ROT 10 11.4 M/L (STAPLE) IMPLANT
CLOTH BEACON ORANGE TIMEOUT ST (SAFETY) ×3 IMPLANT
CUTTER FLEX LINEAR 45M (STAPLE) ×3 IMPLANT
DECANTER SPIKE VIAL GLASS SM (MISCELLANEOUS) IMPLANT
DERMABOND ADVANCED (GAUZE/BANDAGES/DRESSINGS) ×1
DERMABOND ADVANCED .7 DNX12 (GAUZE/BANDAGES/DRESSINGS) ×2 IMPLANT
DRAIN CHANNEL RND F F (WOUND CARE) ×3 IMPLANT
DRAPE LAPAROSCOPIC ABDOMINAL (DRAPES) ×3 IMPLANT
ELECT REM PT RETURN 9FT ADLT (ELECTROSURGICAL) ×3
ELECTRODE REM PT RTRN 9FT ADLT (ELECTROSURGICAL) ×2 IMPLANT
EVACUATOR SILICONE 100CC (DRAIN) ×3 IMPLANT
GLOVE BIOGEL PI IND STRL 6.5 (GLOVE) ×2 IMPLANT
GLOVE BIOGEL PI IND STRL 7.0 (GLOVE) ×2 IMPLANT
GLOVE BIOGEL PI IND STRL 7.5 (GLOVE) ×2 IMPLANT
GLOVE BIOGEL PI INDICATOR 6.5 (GLOVE) ×1
GLOVE BIOGEL PI INDICATOR 7.0 (GLOVE) ×1
GLOVE BIOGEL PI INDICATOR 7.5 (GLOVE) ×1
GLOVE SS BIOGEL STRL SZ 7.5 (GLOVE) ×2 IMPLANT
GLOVE SUPERSENSE BIOGEL SZ 7.5 (GLOVE) ×1
GLOVE SURG SS PI 7.5 STRL IVOR (GLOVE) ×6 IMPLANT
GOWN STRL NON-REIN LRG LVL3 (GOWN DISPOSABLE) ×3 IMPLANT
GOWN STRL REIN XL XLG (GOWN DISPOSABLE) ×6 IMPLANT
IV LACTATED RINGERS 1000ML (IV SOLUTION) ×3 IMPLANT
KIT BASIN OR (CUSTOM PROCEDURE TRAY) ×3 IMPLANT
NS IRRIG 1000ML POUR BTL (IV SOLUTION) ×6 IMPLANT
PENCIL BUTTON HOLSTER BLD 10FT (ELECTRODE) ×3 IMPLANT
POUCH SPECIMEN RETRIEVAL 10MM (ENDOMECHANICALS) ×3 IMPLANT
RELOAD 45 VASCULAR/THIN (ENDOMECHANICALS) ×3 IMPLANT
RELOAD STAPLE TA45 3.5 REG BLU (ENDOMECHANICALS) ×3 IMPLANT
SCALPEL HARMONIC ACE (MISCELLANEOUS) ×3 IMPLANT
SET IRRIG TUBING LAPAROSCOPIC (IRRIGATION / IRRIGATOR) ×3 IMPLANT
SOLUTION ANTI FOG 6CC (MISCELLANEOUS) ×3 IMPLANT
SPONGE DRAIN TRACH 4X4 STRL 2S (GAUZE/BANDAGES/DRESSINGS) ×3 IMPLANT
STRIP CLOSURE SKIN 1/2X4 (GAUZE/BANDAGES/DRESSINGS) ×3 IMPLANT
SUT ETHILON 2 0 PS N (SUTURE) ×3 IMPLANT
SUT MNCRL AB 4-0 PS2 18 (SUTURE) ×3 IMPLANT
SUT VIC AB 3-0 SH 27 (SUTURE) ×2
SUT VIC AB 3-0 SH 27X BRD (SUTURE) ×4 IMPLANT
TOWEL OR 17X26 10 PK STRL BLUE (TOWEL DISPOSABLE) ×3 IMPLANT
TRAY FOLEY METER SIL LF 16FR (CATHETERS) ×3 IMPLANT
TRAY LAP CHOLE (CUSTOM PROCEDURE TRAY) ×3 IMPLANT
TROCAR BLADELESS OPT 5 75 (ENDOMECHANICALS) ×9 IMPLANT
TROCAR XCEL 12X100 BLDLESS (ENDOMECHANICALS) ×6 IMPLANT
TROCAR XCEL BLUNT TIP 100MML (ENDOMECHANICALS) ×3 IMPLANT
TROCAR Z-THREAD FIOS 12X100MM (TROCAR) IMPLANT
TUBE ANAEROBIC SPECIMEN COL (MISCELLANEOUS) ×3 IMPLANT
TUBE CONNECTING VINYL 14FR 30C (MISCELLANEOUS) ×3 IMPLANT
TUBING INSUFFLATION 10FT LAP (TUBING) ×3 IMPLANT

## 2013-01-27 NOTE — Op Note (Signed)
Preoperative Diagnosis: Appendicitis with perforation and abscess  Postoprative Diagnosis: same  Procedure: Procedure(s): APPENDECTOMY LAPAROSCOPIC   Surgeon: Glenna Fellows T   Assistants: Lodema Pilot  Anesthesia:  General endotracheal anesthesia  Indications:   Patient is a 72 year old male hospitalized several weeks ago when he presented with one week of right lower quadrant pain and was found to have a right lower quadrant phlegmon with an apparent perforation at the tip of the appendix. He was initially treated nonoperatively and improved and was discharged on antibiotics. On followup he was asymptomatic and after discussion of options declined interval appendectomy. He however presented 2 days ago with recurrent pain and CT scan has again shown inflammatory change and a small abscess around the tip of the appendix. We have recommended proceeding with laparoscopic exploration and possible appendectomy or ileocecectomy. We discussed the indications for the procedure and alternatives and risks of anesthetic complications, bleeding, infection and anastomotic leakage. He understands and agrees to proceed.    Procedure Detail:  Patient was brought to the operating room, placed in supine position on the operating table, and general endotracheal anesthesia was induced. He was already on broad-spectrum preoperative IV antibiotics. Foley catheter was placed. PAS were placed. The abdomen was widely sterilely prepped and draped. Patient Timeout was performed at procedure verified. Access was obtained without difficulty with an open Hassan technique a 12 mm port at the umbilicus. Under direct vision a 5 mm port was placed in the right upper quadrant and another 5 mm port in the left lower quadrant. There were inflammatory adhesions of the cecum and terminal ileum up to the abdominal wall the right lower quadrant. These were carefully taken down with blunt dissection. A small abscess cavity was entered  and drained. This was cultured. With continued gentle blunt dissection I identified the appendix lying lateral and inferior to the cecum. It was acutely and chronically inflamed with obvious perforation at the tip. The terminal ileum was gently dissected away from the process. The cecum was mobilized enough to allow good visualization of the tip of the cecum and the appendix. The appendiceal base was identified and although somewhat thickened was less inflamed and I felt that it was reasonable to proceed with appendectomy. The appendix was able to be grasped and elevated and the thickened mesoappendix was sequentially divided the harmonic scalpel until the appendix was completely freed down to the tip of the cecum. We felt that this tissue was adequate for staple closure. A 12 mm load 45 mm stapler was then fired across the base of the appendix at the junction with the cecum. The appendix was placed in an Endo Catch bag and removed. The staple line was intact although there was some slight splitting of the serosa on the inferior edge of the staple line. I then sutured some fatty tissue from the fold of Treves up over the staple line to the wall of the cecum for additional layer of coverage using interrupted 3-0 Vicryl. The abdomen was thoroughly irrigated and hemostasis assured. A 19 Blake drain was left in the abscess cavity and the need for cecum. Is brought out through a separate stab wound at the left lower quadrant trocar site. All CO2 was evacuated the mattress suture secured at the umbilical incision. Skin incisions were closed with subcuticular Monocryl and Dermabond.    Findings: Perforated appendicitis with abscess  Estimated Blood Loss:  less than 100 mL         Drains: 19 Blake drain in right  lower quadrant  Blood Given: none          Specimens: #1 appendix  #2 culture and sensitivity right lower quadrant abscess        Complications:  * No complications entered in OR log *          Disposition: PACU - hemodynamically stable.         Condition: stable

## 2013-01-27 NOTE — Transfer of Care (Signed)
Immediate Anesthesia Transfer of Care Note  Patient: Alan [name]  Procedure(s) Performed: Procedure(s): APPENDECTOMY LAPAROSCOPIC (N/A)  Patient Location: PACU  Anesthesia Type:General  Level of Consciousness: awake, alert  and oriented  Airway & Oxygen Therapy: Patient Spontanous Breathing and Patient connected to face mask oxygen  Post-op Assessment: Report given to PACU RN and Post -op Vital signs reviewed and stable  Post vital signs: Reviewed and stable  Complications: No apparent anesthesia complications

## 2013-01-27 NOTE — Anesthesia Preprocedure Evaluation (Signed)
Anesthesia Evaluation  Patient identified by MRN, date of birth, ID band Patient awake    Reviewed: Allergy & Precautions, H&P , NPO status , Patient's Chart, lab work & pertinent test results  Airway Mallampati: II TM Distance: >3 FB Neck ROM: Full    Dental  (+) Teeth Intact and Dental Advisory Given   Pulmonary neg pulmonary ROS,  breath sounds clear to auscultation  Pulmonary exam normal       Cardiovascular hypertension, Pt. on medications Rhythm:Regular Rate:Normal     Neuro/Psych negative neurological ROS  negative psych ROS   GI/Hepatic Neg liver ROS, GERD-  Medicated,  Endo/Other  negative endocrine ROS  Renal/GU negative Renal ROS  negative genitourinary   Musculoskeletal negative musculoskeletal ROS (+)   Abdominal   Peds  Hematology negative hematology ROS (+)   Anesthesia Other Findings   Reproductive/Obstetrics                           Anesthesia Physical Anesthesia Plan  ASA: II  Anesthesia Plan: General   Post-op Pain Management:    Induction: Intravenous  Airway Management Planned: Oral ETT  Additional Equipment:   Intra-op Plan:   Post-operative Plan: Extubation in OR  Informed Consent: I have reviewed the patients History and Physical, chart, labs and discussed the procedure including the risks, benefits and alternatives for the proposed anesthesia with the patient or authorized representative who has indicated his/her understanding and acceptance.   Dental advisory given  Plan Discussed with: CRNA  Anesthesia Plan Comments:         Anesthesia Quick Evaluation

## 2013-01-28 ENCOUNTER — Encounter (HOSPITAL_COMMUNITY): Payer: Self-pay | Admitting: General Surgery

## 2013-01-28 LAB — CBC
MCH: 28.6 pg (ref 26.0–34.0)
MCV: 85.4 fL (ref 78.0–100.0)
Platelets: 259 10*3/uL (ref 150–400)
RDW: 14.5 % (ref 11.5–15.5)
WBC: 6.6 10*3/uL (ref 4.0–10.5)

## 2013-01-28 LAB — BASIC METABOLIC PANEL
Calcium: 8.8 mg/dL (ref 8.4–10.5)
Creatinine, Ser: 0.94 mg/dL (ref 0.50–1.35)
GFR calc Af Amer: >90 mL/min (ref >90)
Sodium: 132 mEq/L — ABNORMAL LOW (ref 135–145)

## 2013-01-28 NOTE — Progress Notes (Signed)
Patient ID: Alan [name], male   DOB: 18-Sep-1940, 72 y.o.   MRN: [id number] 1 Day Post-Op  Subjective: He reports that he got very confused with pain medications last night. He feels better this morning. Mild to moderate right lower quadrant pain. No nausea. Voiding okay.  Objective: Vital signs in last 24 hours: Temp:  [97.5 F (36.4 C)-99.8 F (37.7 C)] 98 F (36.7 C) (07/30 0622) Pulse Rate:  [71-109] 71 (07/30 0622) Resp:  [11-18] 18 (07/30 0622) BP: (121-171)/(70-86) 124/74 mmHg (07/30 0622) SpO2:  [93 %-100 %] 99 % (07/30 0622) Last BM Date: 01/24/13  Intake/Output from previous day: 07/29 0701 - 07/30 0700 In: 3403.3 [P.O.:240; I.V.:2763.3; IV Piggyback:400] Out: 2030 [Urine:1700; Drains:330] Intake/Output this shift:    General appearance: alert, cooperative and no distress GI: mmild distention. Few bowel sounds. Mild right lower quadrant tenderness. J-P drain serous sanguinous drainage. Incision/Wound: laparoscopic incisions clean and dry without evidence of infection  Lab Results:   Recent Labs  01/26/13 0410 01/28/13 0350  WBC 8.8 6.6  HGB 12.5* 12.5*  HCT 37.2* 37.3*  PLT 200 259   BMET  Recent Labs  01/26/13 0410 01/28/13 0350  NA 135 132*  K 3.8 3.9  CL 103 99  CO2 26 24  GLUCOSE 107* 132*  BUN 13 9  CREATININE 1.08 0.94  CALCIUM 8.7 8.8     Studies/Results: No results found.  Anti-infectives: Anti-infectives   Start     Dose/Rate Route Frequency Ordered Stop   01/25/13 0000  Ampicillin-Sulbactam (UNASYN) 3 g in sodium chloride 0.9 % 100 mL IVPB     3 g 100 mL/hr over 60 Minutes Intravenous Every 6 hours 01/24/13 2347     01/24/13 1845  Ampicillin-Sulbactam (UNASYN) 3 g in sodium chloride 0.9 % 100 mL IVPB     3 g 100 mL/hr over 60 Minutes Intravenous  Once 01/24/13 1839 01/24/13 1954      Assessment/Plan: s/p Procedure(s): APPENDECTOMY LAPAROSCOPIC Stable postoperatively. Likely some mild expected ileus. Continue IV  antibiotics. Mobilize. Clear liquid diet.   LOS: 4 days    Persephone Schriever T 01/28/2013

## 2013-01-29 NOTE — Anesthesia Postprocedure Evaluation (Signed)
Anesthesia Post Note  Patient: Alan [name]  Procedure(s) Performed: Procedure(s) (LRB): APPENDECTOMY LAPAROSCOPIC (N/A)  Anesthesia type: General  Patient location: PACU  Post pain: Pain level controlled  Post assessment: Post-op Vital signs reviewed  Last Vitals:  Filed Vitals:   01/29/13 1400  BP: 112/61  Pulse: 94  Temp: 36.8 C  Resp: 18    Post vital signs: Reviewed  Level of consciousness: sedated  Complications: No apparent anesthesia complications

## 2013-01-29 NOTE — Progress Notes (Signed)
Patient ID: Alan [name], male   DOB: 06/28/1941, 72 y.o.   MRN: [id number] 2 Days Post-Op  Subjective: Pain is better, easily controlled with oral meds. Tolerating clear liquids and had a bowel movement this morning.  Objective: Vital signs in last 24 hours: Temp:  [98.2 F (36.8 C)-98.3 F (36.8 C)] 98.3 F (36.8 C) (07/31 0615) Pulse Rate:  [79-89] 83 (07/31 0615) Resp:  [16] 16 (07/31 0615) BP: (117-147)/(72-82) 130/78 mmHg (07/31 0615) SpO2:  [95 %-100 %] 97 % (07/31 0615) Last BM Date: 01/24/13  Intake/Output from previous day: 07/30 0701 - 07/31 0700 In: 2488.3 [I.V.:2288.3; IV Piggyback:200] Out: 3710 [Urine:3600; Drains:110] Intake/Output this shift:    General appearance: alert, cooperative and no distress GI: soft, non-tender and minimal serosanguineous JP drainage  Incision/Wound: Clean and dry without evidence of infection  Lab Results:   Recent Labs  01/28/13 0350  WBC 6.6  HGB 12.5*  HCT 37.3*  PLT 259   BMET  Recent Labs  01/28/13 0350  NA 132*  K 3.9  CL 99  CO2 24  GLUCOSE 132*  BUN 9  CREATININE 0.94  CALCIUM 8.8     Studies/Results: No results found.  Anti-infectives: Anti-infectives   Start     Dose/Rate Route Frequency Ordered Stop   01/25/13 0000  Ampicillin-Sulbactam (UNASYN) 3 g in sodium chloride 0.9 % 100 mL IVPB     3 g 100 mL/hr over 60 Minutes Intravenous Every 6 hours 01/24/13 2347     01/24/13 1845  Ampicillin-Sulbactam (UNASYN) 3 g in sodium chloride 0.9 % 100 mL IVPB     3 g 100 mL/hr over 60 Minutes Intravenous  Once 01/24/13 1839 01/24/13 1954      Assessment/Plan: s/p Procedure(s): APPENDECTOMY LAPAROSCOPIC Doing well. Advance diet. Continue IV antibiotics due to perforation and abscess. Possible discharge tomorrow.   LOS: 5 days    [name] T 01/29/2013

## 2013-01-30 LAB — CULTURE, ROUTINE-ABSCESS

## 2013-01-30 LAB — CBC
Platelets: 287 10*3/uL (ref 150–400)
RBC: 4.57 MIL/uL (ref 4.22–5.81)
RDW: 14.5 % (ref 11.5–15.5)
WBC: 5.5 10*3/uL (ref 4.0–10.5)

## 2013-01-30 MED ORDER — AMOXICILLIN-POT CLAVULANATE 875-125 MG PO TABS: 1.0000 | ORAL_TABLET | Freq: Two times a day (BID) | ORAL | Status: DC

## 2013-01-30 MED ORDER — HYDROCODONE-ACETAMINOPHEN 5-325 MG PO TABS: 1.0000 | ORAL_TABLET | ORAL | Status: DC | PRN

## 2013-01-30 NOTE — Discharge Summary (Signed)
   Patient ID: Alan [name] [id number] 72 y.o. [birth year]  01/24/2013  Discharge date and time: 01/30/2013   Admitting Physician: Glenna Fellows T  Discharge Physician: Glenna Fellows T  Admission Diagnoses: Appendicitis with perforation and abscess  Discharge Diagnoses: Same  Operations: Procedure(s): APPENDECTOMY LAPAROSCOPIC  Admission Condition: fair  Discharged Condition: good  Indication for Admission: patientis a 72 year old male who was hospitalized about one month ago with an apparent delayed presentation of acute appendicitis with phlegmon and small abscess. He improved on IV antibiotics and was discharged and on followup in the office was doing well and declined interval appendectomy. However he recovers in with acute right lower quadrant pain and CT scan showed again acute appendicitis with periappendiceal abscess. He was admitted for further treatment  Hospital Course: patient was admitted and started on broad-spectrum IV antibiotics. We recommended proceeding with laparoscopic and possible open appendectomy with possible need for ileocecectomy. The patient was taken to surgery and fortunately we were able to perform the appendectomy laparoscopically. A small abscess was drained. He was continued on IV antibiotics. His postoperative course was very smooth. He had some moderate pain in the right lower quadrant for the first 24 hours but this quickly improved. He tolerated liquids and his diet was advanced at the time of discharge his bowels are moving. He is afebrile. Abdomen is nontender. White blood count is normal. JP drainage is serous sanguinous and this will be left in the removed in the office.   Disposition: Home  Patient Instructions:    Medication List         amLODipine 5 MG tablet  Commonly known as:  NORVASC  Take 5 mg by mouth every morning.     amoxicillin-clavulanate 875-125 MG per tablet  Commonly known as:  AUGMENTIN  Take 1 tablet by  mouth 2 (two) times daily.     aspirin EC 81 MG tablet  Take 81 mg by mouth every morning.     COMBIGAN 0.2-0.5 % ophthalmic solution  Generic drug:  brimonidine-timolol  Place 1 drop into both eyes every 12 (twelve) hours.     finasteride 5 MG tablet  Commonly known as:  [name]  Take 5 mg by mouth every morning.     HYDROcodone-acetaminophen 5-325 MG per tablet  Commonly known as:  NORCO/VICODIN  Take 1-2 tablets by mouth every 4 (four) hours as needed.     lactulose 10 GM/15ML solution  Commonly known as:  CHRONULAC  Take 15 mLs by mouth 2 (two) times daily as needed (bowel movement).     omeprazole 20 MG capsule  Commonly known as:  PRILOSEC  Take 20 mg by mouth every morning.     polyethylene glycol packet  Commonly known as:  MIRALAX / GLYCOLAX  Take 17 g by mouth every morning.     simvastatin 20 MG tablet  Commonly known as:  ZOCOR  Take 20 mg by mouth every morning.     tamsulosin 0.4 MG Caps  Commonly known as:  FLOMAX  Take 0.4 mg by mouth every evening.        Activity: activity as tolerated Diet: regular diet Wound Care: none needed  Follow-up:  With Dr. Johna Sheriff in 1 week.  Signed: Mariella Saa MD, FACS  01/30/2013, 9:48 AM

## 2013-02-01 LAB — ANAEROBIC CULTURE

## 2013-02-04 ENCOUNTER — Ambulatory Visit (INDEPENDENT_AMBULATORY_CARE_PROVIDER_SITE_OTHER): Payer: Medicare Other | Admitting: General Surgery

## 2013-02-04 ENCOUNTER — Encounter (INDEPENDENT_AMBULATORY_CARE_PROVIDER_SITE_OTHER): Payer: Self-pay | Admitting: General Surgery

## 2013-02-04 VITALS — BP 110/62 | HR 68 | Resp 16 | Ht 67.0 in | Wt 166.6 lb

## 2013-02-04 DIAGNOSIS — K3533 Acute appendicitis with perforation and localized peritonitis, with abscess: Secondary | ICD-10-CM

## 2013-02-04 NOTE — Patient Instructions (Addendum)
Call for any increasing pain, nausea, fever or other concerns

## 2013-02-04 NOTE — Progress Notes (Signed)
History: The patient returns perching one week following laparoscopic appendectomy for recurrent acute appendicitis with abscess. His wife was concerned about some abdominal bloating but he has been using some fiber and suppositories and had normal bowel movements and is feeling somewhat better. Denies pain or fever nausea.  Exam: BP 110/62  Pulse 68  Resp 16  Ht 5\' 7"  (1.702 m)  Wt 166 lb 9.6 oz (75.569 kg)  BMI 26.09 kg/m2 afebrile General: Appears well Abdomen: Nondistended. Minimal right lower quadrant tenderness. JP drain has minimal serous drainage and it is removed. Wounds healing well  Pathology showed acute appendicitis with abscess  Assessment and plan: Doing well following laparoscopic appendectomy.  Return in 3 weeks for a final check

## 2013-02-05 ENCOUNTER — Encounter (INDEPENDENT_AMBULATORY_CARE_PROVIDER_SITE_OTHER): Payer: Medicare Other | Admitting: General Surgery

## 2013-02-25 ENCOUNTER — Ambulatory Visit (INDEPENDENT_AMBULATORY_CARE_PROVIDER_SITE_OTHER): Payer: Medicare Other | Admitting: General Surgery

## 2013-02-25 ENCOUNTER — Encounter (INDEPENDENT_AMBULATORY_CARE_PROVIDER_SITE_OTHER): Payer: Self-pay | Admitting: General Surgery

## 2013-02-25 VITALS — BP 134/86 | HR 82 | Temp 97.7°F | Resp 16 | Ht 67.0 in | Wt 168.6 lb

## 2013-02-25 DIAGNOSIS — Z09 Encounter for follow-up examination after completed treatment for conditions other than malignant neoplasm: Secondary | ICD-10-CM

## 2013-02-25 NOTE — Progress Notes (Signed)
Chief complaint: Followup appendectomy  History: Patient returns for followup after laparoscopic appendectomy for recurrent acute appendicitis with small periappendiceal abscess. He has got a long very well since surgery. Denies any ongoing pain or fever or other complaints.  Exam: BP 134/86  Pulse 82  Temp(Src) 97.7 F (36.5 C) (Temporal)  Resp 16  Ht 5\' 7"  (1.702 m)  Wt 168 lb 9.6 oz (76.476 kg)  BMI 26.4 kg/m2  SpO2 97% General: Appears well Abdomen: Arthroscopic incisions well healed. Soft and nontender. No masses.  Pathology: Showed acute and chronic appendicitis and fibrosis without tumor  Assessment and plan: Doing well following appendectomy. He is discharged from my care return as needed.

## 2013-04-06 ENCOUNTER — Other Ambulatory Visit: Payer: Self-pay | Admitting: Pulmonary Disease

## 2013-04-07 ENCOUNTER — Encounter (HOSPITAL_COMMUNITY): Payer: Self-pay | Admitting: Emergency Medicine

## 2013-04-07 ENCOUNTER — Emergency Department (HOSPITAL_COMMUNITY)
Admission: EM | Admit: 2013-04-07 | Discharge: 2013-04-07 | Disposition: A | Discharge status: Home / Self Care | Payer: Medicare Other | Source: Home / Self Care | Attending: Emergency Medicine | Admitting: Emergency Medicine

## 2013-04-07 DIAGNOSIS — J00 Acute nasopharyngitis [common cold]: Secondary | ICD-10-CM

## 2013-04-07 DIAGNOSIS — IMO0001 Reserved for inherently not codable concepts without codable children: Secondary | ICD-10-CM

## 2013-04-07 MED ORDER — BENZONATATE 200 MG PO CAPS: 200.0000 mg | ORAL_CAPSULE | Freq: Three times a day (TID) | ORAL | Status: DC | PRN

## 2013-04-07 MED ORDER — METHYLPREDNISOLONE (PAK) 4 MG PO TABS: ORAL_TABLET | ORAL | Status: DC

## 2013-04-07 MED ORDER — FLUTICASONE PROPIONATE 50 MCG/ACT NA SUSP: 2.0000 | Freq: Every day | NASAL | Status: DC

## 2013-04-07 NOTE — ED Notes (Signed)
Pt c/o cold onset yest Sxs include: fever, sneezing, dry cough, chest congestion Denies: SOB, wheezing, v/n/d Taking OTC cold meds w/no relief.

## 2013-04-07 NOTE — ED Provider Notes (Signed)
CSN: [id number]     Arrival date & time 04/07/13  1812 History   First MD Initiated Contact with Patient 04/07/13 1920     Chief Complaint  Patient presents with  . URI   (Consider location/radiation/quality/duration/timing/severity/associated sxs/prior Treatment) HPI Comments: 72 year old male presents complaining of having cold symptoms for 2 days. He has subjective fever, sneezing, nasal congestion, runny nose, dry cough, congestion in his chest. He states that his primary care physician will usually give him 3 prescriptions for a Z-Pak at the beginning of every winter so he just needs to get this. He denies measured fever, chills, NVD, chest pain, shortness of breath, pleuritic pain. He states he does not really feel that sick, he just has bothersome cold symptoms. He says he is limited in which over-the-counter medications he can take due to his history of BPH.  Patient is a 72 y.o. male presenting with URI.  URI Presenting symptoms: congestion, cough, fever and rhinorrhea   Presenting symptoms: no fatigue and no sore throat   Associated symptoms: sneezing   Associated symptoms: no arthralgias, no myalgias and no neck pain     Past Medical History  Diagnosis Date  . Sleep apnea     does not use cpap  . Unspecified essential hypertension   . Hypercholesteremia   . GERD (gastroesophageal reflux disease)   . Diverticulosis of colon (without mention of hemorrhage)   . Irritable bowel syndrome   . Benign neoplasm of colon     colon polyps  . Benign hypertrophy of prostate   . Erectile dysfunction   . Restless legs syndrome (RLS)   . Anxiety state, unspecified   . Onychomycosis    Past Surgical History  Procedure Laterality Date  . None    . Colonoscopy    . Laparoscopic appendectomy N/A 01/27/2013    Procedure: APPENDECTOMY LAPAROSCOPIC;  Surgeon: Mariella Saa, MD;  Location: [facility];  Service: General;  Laterality: N/A;  . Appendectomy  12/2012   Family History   Problem Relation Age of Onset  . Colon cancer Paternal Grandfather 76  . Cancer Paternal Grandfather     prostate  . Cancer Father     prostate  . Cancer Brother     prostate   History  Substance Use Topics  . Smoking status: Former Smoker    Quit date: 07/02/1968  . Smokeless tobacco: Never Used  . Alcohol Use: No    Review of Systems  Constitutional: Positive for fever. Negative for chills and fatigue.  HENT: Positive for congestion, rhinorrhea, sneezing and postnasal drip. Negative for sore throat, neck pain, neck stiffness and sinus pressure.   Eyes: Negative for visual disturbance.  Respiratory: Positive for cough and chest tightness. Negative for shortness of breath.   Cardiovascular: Negative for chest pain, palpitations and leg swelling.  Gastrointestinal: Negative for nausea, vomiting, abdominal pain, diarrhea and constipation.  Genitourinary: Negative for dysuria, urgency, frequency and hematuria.  Musculoskeletal: Negative for myalgias and arthralgias.  Skin: Negative for rash.  Neurological: Negative for dizziness, weakness and light-headedness.    Allergies  Codeine  Home Medications   Current Outpatient Rx  Name  Route  Sig  Dispense  Refill  . amLODipine (NORVASC) 5 MG tablet   Oral   Take 5 mg by mouth every morning.         [name] aspirin EC 81 MG tablet   Oral   Take 81 mg by mouth every morning.          [name]  benzonatate (TESSALON) 200 MG capsule   Oral   Take 1 capsule (200 mg total) by mouth 3 (three) times daily as needed for cough.   30 capsule   1   . brimonidine-timolol (COMBIGAN) 0.2-0.5 % ophthalmic solution   Both Eyes   Place 1 drop into both eyes every 12 (twelve) hours.         . finasteride ([name]) 5 MG tablet   Oral   Take 5 mg by mouth every morning.         . fluticasone (FLONASE) 50 MCG/ACT nasal spray   Nasal   Place 2 sprays into the nose daily.   16 g   0   . lactulose (CHRONULAC) 10 GM/15ML solution   Oral    Take 15 mLs by mouth 2 (two) times daily as needed (bowel movement).         . methylPREDNIsolone (MEDROL DOSPACK) 4 MG tablet      follow package directions   21 tablet   0   . omeprazole (PRILOSEC) 20 MG capsule   Oral   Take 20 mg by mouth every morning.         . polyethylene glycol (MIRALAX / GLYCOLAX) packet   Oral   Take 17 g by mouth every morning.          . simvastatin (ZOCOR) 20 MG tablet   Oral   Take 20 mg by mouth every morning.          . Tamsulosin HCl (FLOMAX) 0.4 MG CAPS   Oral   Take 0.4 mg by mouth every evening.           BP 155/90  Pulse 80  Temp(Src) 98.9 F (37.2 C) (Oral)  Resp 18  SpO2 98% Physical Exam  Nursing note and vitals reviewed. Constitutional: He is oriented to person, place, and time. He appears well-developed and well-nourished. No distress.  HENT:  Head: Normocephalic and atraumatic.  Right Ear: External ear normal.  Left Ear: External ear normal.  Nose: Nose normal.  Mouth/Throat: Oropharynx is clear and moist. No oropharyngeal exudate.  Cardiovascular: Normal rate, regular rhythm, normal heart sounds and intact distal pulses.  Exam reveals no gallop and no friction rub.   No murmur heard. Pulmonary/Chest: Effort normal and breath sounds normal. No respiratory distress. He has no wheezes. He has no rales.  Neurological: He is alert and oriented to person, place, and time. Coordination normal.  Skin: Skin is warm and dry. No rash noted. He is not diaphoretic.  Psychiatric: He has a normal mood and affect. Judgment normal.    ED Course  Procedures (including critical care time) Labs Review Labs Reviewed - No data to display Imaging Review No results found.  MDM   1. Cold    Physical exam is normal. I have explained to this patient that azithromycin will not be helpful for a common cold, he is doubtful but he is willing to give it a shot. Followup if not improving. Advised to expect the cough to  persist.  Meds ordered this encounter  Medications  . benzonatate (TESSALON) 200 MG capsule    Sig: Take 1 capsule (200 mg total) by mouth 3 (three) times daily as needed for cough.    Dispense:  30 capsule    Refill:  1    Order Specific Question:  Supervising Provider    Answer:  Lorenz Coaster, DAVID C [id number]  . methylPREDNIsolone (MEDROL DOSPACK) 4 MG tablet  Sig: follow package directions    Dispense:  21 tablet    Refill:  0    Order Specific Question:  Supervising Provider    Answer:  Lorenz Coaster, DAVID C [id number]  . fluticasone (FLONASE) 50 MCG/ACT nasal spray    Sig: Place 2 sprays into the nose daily.    Dispense:  16 g    Refill:  0    Order Specific Question:  Supervising Provider    Answer:  Lorenz Coaster, DAVID C [[id number]]     Graylon Good, PA-C 04/07/13 2119  Graylon Good, PA-C 04/07/13 2120

## 2013-04-07 NOTE — ED Provider Notes (Signed)
Medical screening examination/treatment/procedure(s) were performed by non-physician practitioner and as supervising physician I was immediately available for consultation/collaboration.  Leslee Home, M.D.  Reuben Likes, MD 04/07/13 2124

## 2013-04-10 ENCOUNTER — Telehealth: Payer: Self-pay | Admitting: Pulmonary Disease

## 2013-04-10 MED ORDER — AZITHROMYCIN 250 MG PO TABS: ORAL_TABLET | ORAL | Status: DC

## 2013-04-10 NOTE — Telephone Encounter (Signed)
Per SN---  Ok to call in zpak #1  Take as directed with no refills. Called and spoke with pt and he is aware of rx that has been sent to the pharmacy per SN recs.  Nothing further is needed.

## 2013-04-10 NOTE — Telephone Encounter (Signed)
Spoke with the pt  He states having prod cough with minimal clear sputum, slight throat and chest/head congestion x 4 days  Taking otc meds (which he could not name) with minimal relief Declined appt since he is getting ready to leave town Requesting zpack  Please advise, thanks! Allergies  Allergen Reactions  . Codeine Other (See Comments)    REACTION: hallucinations,faint,dizziness   Last ov was in April with TP  No appt pending

## 2013-04-28 ENCOUNTER — Other Ambulatory Visit: Payer: Self-pay | Admitting: Pulmonary Disease

## 2013-04-29 ENCOUNTER — Ambulatory Visit (INDEPENDENT_AMBULATORY_CARE_PROVIDER_SITE_OTHER): Payer: Medicare Other

## 2013-04-29 DIAGNOSIS — Z23 Encounter for immunization: Secondary | ICD-10-CM

## 2013-05-12 ENCOUNTER — Telehealth: Payer: Self-pay | Admitting: Pulmonary Disease

## 2013-05-12 MED ORDER — FLUTICASONE PROPIONATE 50 MCG/ACT NA SUSP: 2.0000 | Freq: Every day | NASAL | Status: DC

## 2013-05-12 MED ORDER — AMLODIPINE BESYLATE 5 MG PO TABS: 5.0000 mg | ORAL_TABLET | Freq: Every morning | ORAL | Status: DC

## 2013-05-12 MED ORDER — OMEPRAZOLE 20 MG PO CPDR: 20.0000 mg | DELAYED_RELEASE_CAPSULE | Freq: Every morning | ORAL | Status: DC

## 2013-05-12 MED ORDER — FINASTERIDE 5 MG PO TABS: 5.0000 mg | ORAL_TABLET | Freq: Every morning | ORAL | Status: DC

## 2013-05-12 MED ORDER — TAMSULOSIN HCL 0.4 MG PO CAPS: 0.4000 mg | ORAL_CAPSULE | Freq: Every evening | ORAL | Status: DC

## 2013-05-12 MED ORDER — SIMVASTATIN 20 MG PO TABS: 20.0000 mg | ORAL_TABLET | Freq: Every morning | ORAL | Status: DC

## 2013-05-12 NOTE — Telephone Encounter (Signed)
Pt last seen by SN on 04/03/12 and seen by TP on 10/27/12; no OV scheduled. Please advise where to put patient on schedule(next open slot or work in ?); also, okay to send refills for meds until then??? Thanks.

## 2013-05-12 NOTE — Telephone Encounter (Signed)
Called and spoke with pt and he is aware of SN recs to fill his meds.  Pt requested 90 day refill. Pt is aware of refills sent to the pharmacy and appt has been scheduled for the pt to see SN on 12-4.

## 2013-05-22 ENCOUNTER — Telehealth: Payer: Self-pay | Admitting: Pulmonary Disease

## 2013-05-22 NOTE — Telephone Encounter (Signed)
Called and spoke with shawn and he is going to refax these forms to the machine up front.  He stated that he will call back next week to follow up.

## 2013-06-04 ENCOUNTER — Ambulatory Visit (INDEPENDENT_AMBULATORY_CARE_PROVIDER_SITE_OTHER): Payer: Medicare Other | Admitting: Pulmonary Disease

## 2013-06-04 ENCOUNTER — Encounter: Payer: Self-pay | Admitting: Pulmonary Disease

## 2013-06-04 VITALS — BP 120/84 | HR 67 | Temp 97.5°F | Ht 66.0 in | Wt 180.4 lb

## 2013-06-04 DIAGNOSIS — E7800 Pure hypercholesterolemia, unspecified: Secondary | ICD-10-CM

## 2013-06-04 DIAGNOSIS — K219 Gastro-esophageal reflux disease without esophagitis: Secondary | ICD-10-CM

## 2013-06-04 DIAGNOSIS — K589 Irritable bowel syndrome without diarrhea: Secondary | ICD-10-CM

## 2013-06-04 DIAGNOSIS — Z87898 Personal history of other specified conditions: Secondary | ICD-10-CM

## 2013-06-04 DIAGNOSIS — I10 Essential (primary) hypertension: Secondary | ICD-10-CM

## 2013-06-04 DIAGNOSIS — F411 Generalized anxiety disorder: Secondary | ICD-10-CM

## 2013-06-04 DIAGNOSIS — K5909 Other constipation: Secondary | ICD-10-CM

## 2013-06-04 DIAGNOSIS — D126 Benign neoplasm of colon, unspecified: Secondary | ICD-10-CM

## 2013-06-04 DIAGNOSIS — K5730 Diverticulosis of large intestine without perforation or abscess without bleeding: Secondary | ICD-10-CM

## 2013-06-04 DIAGNOSIS — G2581 Restless legs syndrome: Secondary | ICD-10-CM

## 2013-06-04 MED ORDER — SIMVASTATIN 20 MG PO TABS: 20.0000 mg | ORAL_TABLET | Freq: Every morning | ORAL | Status: DC

## 2013-06-04 MED ORDER — FINASTERIDE 5 MG PO TABS: 5.0000 mg | ORAL_TABLET | Freq: Every morning | ORAL | Status: DC

## 2013-06-04 MED ORDER — FLUTICASONE PROPIONATE 50 MCG/ACT NA SUSP: 2.0000 | Freq: Every day | NASAL | Status: DC

## 2013-06-04 MED ORDER — OMEPRAZOLE 20 MG PO CPDR: 20.0000 mg | DELAYED_RELEASE_CAPSULE | Freq: Every morning | ORAL | Status: DC

## 2013-06-04 MED ORDER — AMLODIPINE BESYLATE 5 MG PO TABS: 5.0000 mg | ORAL_TABLET | Freq: Every morning | ORAL | Status: DC

## 2013-06-04 MED ORDER — TAMSULOSIN HCL 0.4 MG PO CAPS: 0.4000 mg | ORAL_CAPSULE | Freq: Every evening | ORAL | Status: DC

## 2013-06-04 NOTE — Telephone Encounter (Signed)
Please advise leigh thanks 

## 2013-06-04 NOTE — Telephone Encounter (Signed)
Shawn from [employer] [phone].  Would like to know if we rec'd this forms.  Antionette Fairy

## 2013-06-04 NOTE — Progress Notes (Signed)
Subjective:     Patient ID: Alan [name], male   DOB: 04/16/1941, 72 y.o.   MRN: [id number]  HPI 72 y/o BM here for a follow up visit... he has mult med problems as noted below...   ~  August 31, 2010:  2 year ROV & review> requests 90d refills today...    Alan [name] had further memory concerns & eval here & by [name] Neuro showed no signif atrophy but +sm vessel changes & part empty sella on MRI, and labs all essent WNL x +FTA, no known prev hx & never treated, CSF eval was also neg & he received series of 3 shots LA Bicillin last 03/28/10...    He had GI eval from [facility] 1/11 w/ EGD showing mult GE erosions & duodenitis- treated w/ PPI Prilosec & improved;  colonoscopy at the same time showed several polyps- benign mucosa w/o adenomatous change seen...    He saw Urology at [facility] 6/11 [name]> enlarged prostate & median lobe, w/ low vol- hi press detrusor contraction, they rec surg for his bladder outlet obstruction but he declined & opted for FISASTERIDE 5mg /d & improved he says;  he tells me he had his PSA checked at [facility] & it was "OK"...    He had bronchitic infection 11/11 & seen at [facility] w/ Levaquin Rx;  also had sharp CWP from the cough, slowly improved over time...    Finally, he was dx w/ Glaucoma on [name] gtts per [name] & may need laser surg...  ~  April 03, 2012:  19mo ROV & Alan [name] is c/o a recent URI w/ dry cough, congestion, some drainage, but no f/c/s/ SOB/ CP/ etc; rec to treat w/ ZPak, cough drops, Mucinex, fluids, Tylenol, etc...     HBP>  On Norvasc5; BP= 118/82 & he denies CP, palpit, SOB, edema, etc...    CHOL>  On Simva20 + diet; FLP showed TChol 146, TG 68, HDL 45, LDL 88; continue same med & diet...    GERD>  On Prilosec20; he denied abd pain, n/v, dysphagia, etc...    Divertics, IBS w/ constip, Colon Polyps>  On Linzess145 per [name] & this really helps; up to date on colon etc...    BPH>  On [name]5 & Flomax0.4; followed by [name] w/ rec for  TURP & 2nd opinion at [facility] but he has declined surg...    RLS>  Not on meds his symptoms are not that bad & he is managing well "I deal w/ it- walking helps"...    Anxiety>  Not on meds and states he is doing well overall... We reviewed prob list, meds, xrays and labs> see below for updates >> OK 2013 flu vaccine today & refills of meds... LABS 10/13:  FLP- at goals on Simva20;  Chems- wnl x BS=108;  CBC- wnl;  TSH=0.34 & we will recheck w/ FreeT3 & FreeT4... ADDENDUM>> f/u TFTs 10/13 showed normal TSH=0.93 & normal FreeT3 & FreeT4...   ~  June 04, 2013:  78mo ROV & Alan [name] had a prob w/ recurrent RLQ pain and finally had a Laparoscopic appendectomy 8/14 by DrHoxworth- now feeling 100% better he says and back to normal... We reviewed the following medical problems during today's office visit >>     HBP>  On ASA81, Norvasc5; BP= 120/84 & he denies CP, palpit, SOB, edema, etc...    CHOL>  On Simva20 + diet; FLP 10/13 showed TChol 146, TG 68, HDL 45, LDL 88; continue same med &  diet.Marland KitchenMarland Kitchen    GERD>  On Prilosec20; he had LapAppy 8/14 by DrHoxworth & now back to baseline w/o abd pain, n/v, c/d, blood seen...    Divertics, IBS w/ constip, Colon Polyps>  Off prev Linzess, Chronulac, Miralax; had colonoscopy 2/14 w/ int hems, otherw wnl, & f/u rec in 93yrs...    BPH>  On [name]5 & Flomax0.4; followed by [name] (he does pt's PSA) w/ rec for TURP & 2nd opinion at [facility] but he has declined surg...    RLS>  Not on meds his symptoms are not that bad & he is managing well "I deal w/ it- walking helps"...    Anxiety>  Not on meds and states he is doing well overall... We reviewed prob list, meds, xrays and labs> see below for updates >> he had the 2014 flu vaccine 10/14... Refill of all meds for 90d supplies...  LABS from July-Aug2014 reviewed in Epic...           Problem List:   OBSTRUCTIVE SLEEP APNEA (ICD-327.23) - s/p sleep study 1/08 showing RDI=16, and desat to 89%... he had 4+leg jerks and given  trial Requip (?helped)... states he rests well and wakes feeling refreshed most of the time "I live w/ it, it's not that bad"...  HYPERTENSION (ICD-401.9) - controlled on NORVASC 5mg /d, and prev on Cardura but this was switched by [name]...  ~  10/13: BP=118/82, tol meds well... denies HA, fatigue, visual changes, CP, palipit, dizziness, syncope, dyspnea, edema, etc... he had a Cardiolite scan in 1997 that was neg/ normal w/ EF=75%... ~  CXR 4/14 showed norm heart size, atherosclerotic in ThorAo, clear lungs, NAD... ~  12/14: on ASA81, Norvasc5; BP= 120/84 & he denies CP, palpit, SOB, edema, etc  HYPERCHOLESTEROLEMIA (ICD-272.0) - on SIMVASTATIN 20mg /d + diet...  ~  FLP 3/08 showed TChol 157, TG 53, HDL 53, LDL 94 ~  FLP 3/09 showed TChol 124, TG 33, HDL 44, LDL 74 ~  FLP 4/10 showed TChol 123, TG 36, HDL 41, LDL 75 ~  FLP 3/12 showed TChol 148, TG 47, HDL 49, LDL 89... continue Simva20. ~  FLP 10/13 on Simva20 showed TChol 146, TG 68, HDL 45, LDL 88... Continue same.  GERD (ICD-530.81) - prev rec to take Prevacid 30mg  & Zantac 150Qhs... GI eval 1/11 by Dorris Singh w/ rec to take PRILOSEC daily ~  EGD 5/02 showing esophagitis and gastritis... ~  EGD 1/11 showed GE junction erosions & duodenitis... Rx Prilosec & improved symptomatically.  DIVERTICULOSIS OF COLON (ICD-562.10) - on Metamucil daily...  IRRITABLE BOWEL SYNDROME w/ CONSTIPATION > on LINZESS 145mg /d per GI... COLONIC POLYPS (ICD-211.3) ~  colonoscopy 5/07 by DrSam showing divertics and 3-27mm adenomatous polyps removed... f/u planned 64yrs... ~  colonoscopy 1/11 w/ several pedunculated polyps & path was neg- benign colonic mucosa w/o adenomatous change. ~  Colonoscopy 2/14 by [name] showed int hems, otherw neg, and f/u rec in 7 yrs...  ~  5/14: initial hosp for RLQ pain & dx w/ appendicitis- treated non-operatively ~  CT Abd & Pelvis 7/14 showed suspected acute on chr perforated appendicitis w/ sm periappendiceal fluid  collection, no free air; enlarged prostate, mild degen changes in Tspine, sm bilat renal cysts...   BENIGN PROSTATIC HYPERTROPHY, HX OF (ICD-V13.8) - seen by DrHumphries previously... now seeing [name] but pt doesn't feel that he is doing as well on the [facility] 0.4mg /d (prev on [name] + Cardura)... I've encouraged him to discuss this w/ [name]... he sought 2nd opinion at [place], [place]-  enlarged prostate & median lobe, w/ low vol- hi press detrusor contraction, they rec surg for his bladder outlet obstruction but he declined & opted for FISASTERIDE 5mg /d & improved he says... ~  5/14:  He was seen by [name] during his 5/14 hosp for appendicitis; CT showed prostatic enlargement, BPH, BOO, & he was rec to f/u as outpt for poss cysto...  ERECTILE DYSFUNCTION (ICD-302.72) - Tried all 3 PDE4 inhibitors, but not much benefit... consider shots.  ~  labs in 2008/11/17 at Urology office was 1.47 per patient. ~  11-18-1999: he tells me he had his PSA checked at [facility] & it was "OK"...  Hx POS RPR and T-PALLIDUM Antibodies >> labs confirmed pos in 2009/11/17 and he went to DrRobinson at health dept for 2nd opinion; labs confirmed & DrRobinson wanted him to get Neuro eval w/ LP; seen by DrYan for [facility]- CSF studies to T-Pallidum were neg; treated w/ series of 3 Benzathine PenG injections...  ~  MRI Brain 8/11 showed a few nonspecific areas of bifrontal gliosis and a partially empty sella...   RESTLESS LEG SYNDROME (ICD-333.94) - prev on Requip 1mg Qhs, but stopped this med due to stomach side effects ... he notes no diff w/ resting- good nights, and bad nights...   ANXIETY (ICD-300.00) - he does not desire anxiolytic Rx...  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   Past Medical History  Diagnosis Date  . Sleep apnea     does not use cpap  . Unspecified essential hypertension   . Hypercholesteremia   . GERD (gastroesophageal reflux disease)   .  Diverticulosis of colon (without mention of hemorrhage)   . Irritable bowel syndrome   . Benign neoplasm of colon     colon polyps  . Benign hypertrophy of prostate   . Erectile dysfunction   . Restless legs syndrome (RLS)   . Anxiety state, unspecified   . Onychomycosis     Past Surgical History  Procedure Laterality Date  . None    . Colonoscopy    . Laparoscopic appendectomy N/A 01/27/2013    Procedure: APPENDECTOMY LAPAROSCOPIC;  Surgeon: Mariella Saa, MD;  Location: [facility];  Service: General;  Laterality: N/A;  . Appendectomy  12/2012    Outpatient Encounter Prescriptions as of 06/04/2013  Medication Sig  . amLODipine (NORVASC) 5 MG tablet Take 1 tablet (5 mg total) by mouth every morning.  [name] aspirin EC 81 MG tablet Take 81 mg by mouth every morning.   . brimonidine-timolol (COMBIGAN) 0.2-0.5 % ophthalmic solution Place 1 drop into both eyes every 12 (twelve) hours.  . finasteride ([name]) 5 MG tablet Take 1 tablet (5 mg total) by mouth every morning.  . fluticasone (FLONASE) 50 MCG/ACT nasal spray Place 2 sprays into both nostrils daily.  [name] lactulose (CHRONULAC) 10 GM/15ML solution Take 15 mLs by mouth 2 (two) times daily as needed (bowel movement).  [name] omeprazole (PRILOSEC) 20 MG capsule Take 1 capsule (20 mg total) by mouth every morning.  . polyethylene glycol (MIRALAX / GLYCOLAX) packet Take 17 g by mouth every morning.   . simvastatin (ZOCOR) 20 MG tablet Take 1 tablet (20 mg total) by mouth every morning.  . tamsulosin (FLOMAX) 0.4 MG CAPS capsule Take 1 capsule (0.4 mg total) by mouth every evening.  . [DISCONTINUED] azithromycin (ZITHROMAX) 250 MG tablet Take as directed  . [DISCONTINUED] benzonatate (TESSALON) 200 MG capsule Take 1 capsule (200 mg total) by mouth 3 (three)  times daily as needed for cough.  . [DISCONTINUED] methylPREDNIsolone (MEDROL DOSPACK) 4 MG tablet follow package directions    Allergies  Allergen Reactions  . Codeine Other (See Comments)     REACTION: hallucinations,faint,dizziness    Current Medications, Allergies, Past Medical History, Past Surgical History, Family History, and Social History were reviewed in [employer] record.   Review of Systems        See HPI - all other systems neg except as noted... The patient denies anorexia, fever, weight loss, weight gain, vision loss, decreased hearing, hoarseness, chest pain, syncope, dyspnea on exertion, peripheral edema, prolonged cough, headaches, hemoptysis, abdominal pain, melena, hematochezia, severe indigestion/heartburn, hematuria, incontinence, muscle weakness, suspicious skin lesions, transient blindness, difficulty walking, depression, unusual weight change, abnormal bleeding, enlarged lymph nodes, and angioedema.     Objective:   Physical Exam    WD, WN, 72 y/o BM in NAD... GENERAL:  Alert & oriented; pleasant & cooperative... HEENT:  [place]/AT, EOM-wnl, PERRLA, EACs-clear, TMs-wnl, NOSE-clear, THROAT-clear & wnl. NECK:  Supple w/ full ROM; no JVD; normal carotid impulses w/o bruits; no thyromegaly or nodules palpated; no lymphadenopathy. CHEST:  Clear to P & A; without wheezes/ rales/ or rhonchi. HEART:  Regular Rhythm; without murmurs/ rubs/ or gallops. ABDOMEN:  Soft & nontender; normal bowel sounds; no organomegaly or masses detected. EXT: without deformities, mild arthritic changes; no varicose veins/ venous insuffic/ or edema. NEURO:  CN's intact; motor testing normal; sensory testing normal; gait normal & balance OK. DERM:  No lesions noted; no rash etc...  MMSE:  Date- 16Apr2010;  Pres= Obama, VP= Biden;  Recall- 3/3 immed and 2/3 after distraction;  Serial7's- [phone];  WORLD backwards= DLORW, then corrected himself;  Proverbs= normal abstractions.  RADIOLOGY DATA:  Reviewed in the EPIC EMR & discussed w/ the patient...  LABORATORY DATA:  Reviewed in the EPIC EMR & discussed w/ the patient...   Assessment:      HBP>   Well controlled on Amlod5; continue same + diet & low sodium...  CHOL>  Well regulated on Simva20; continue same...  GERD>  Stable on Prilosec20; continue same Rx...  Divertics, IBS, Colon Polyps>  He notes improvement in his chr constipation on Linzess 145...  S/P Appendectomy 8/14 by drHoxworth...  BPH>  Followed by [name] on Flomax & [name]; he has declined TURP surg, they do his PSAs...  RLS>  States it's not that bad & not requiring meds...  Anxiety>  Similarly, he notes not that bad & not on meds...     Plan:     Patient's Medications  New Prescriptions   No medications on file  Previous Medications   ASPIRIN EC 81 MG TABLET    Take 81 mg by mouth every morning.    BRIMONIDINE-TIMOLOL (COMBIGAN) 0.2-0.5 % OPHTHALMIC SOLUTION    Place 1 drop into both eyes every 12 (twelve) hours.   LACTULOSE (CHRONULAC) 10 GM/15ML SOLUTION    Take 15 mLs by mouth 2 (two) times daily as needed (bowel movement).   POLYETHYLENE GLYCOL (MIRALAX / GLYCOLAX) PACKET    Take 17 g by mouth every morning.   Modified Medications   Modified Medication Previous Medication   AMLODIPINE (NORVASC) 5 MG TABLET amLODipine (NORVASC) 5 MG tablet      Take 1 tablet (5 mg total) by mouth every morning.    Take 1 tablet (5 mg total) by mouth every morning.   FINASTERIDE ([name]) 5 MG TABLET finasteride ([name]) 5 MG tablet  Take 1 tablet (5 mg total) by mouth every morning.    Take 1 tablet (5 mg total) by mouth every morning.   FLUTICASONE (FLONASE) 50 MCG/ACT NASAL SPRAY fluticasone (FLONASE) 50 MCG/ACT nasal spray      Place 2 sprays into both nostrils daily.    Place 2 sprays into both nostrils daily.   OMEPRAZOLE (PRILOSEC) 20 MG CAPSULE omeprazole (PRILOSEC) 20 MG capsule      Take 1 capsule (20 mg total) by mouth every morning.    Take 1 capsule (20 mg total) by mouth every morning.   SIMVASTATIN (ZOCOR) 20 MG TABLET simvastatin (ZOCOR) 20 MG tablet      Take 1 tablet (20 mg total) by mouth  every morning.    Take 1 tablet (20 mg total) by mouth every morning.   TAMSULOSIN (FLOMAX) 0.4 MG CAPS CAPSULE tamsulosin (FLOMAX) 0.4 MG CAPS capsule      Take 1 capsule (0.4 mg total) by mouth every evening.    Take 1 capsule (0.4 mg total) by mouth every evening.  Discontinued Medications   AZITHROMYCIN (ZITHROMAX) 250 MG TABLET    Take as directed   BENZONATATE (TESSALON) 200 MG CAPSULE    Take 1 capsule (200 mg total) by mouth 3 (three) times daily as needed for cough.   METHYLPREDNISOLONE (MEDROL DOSPACK) 4 MG TABLET    follow package directions

## 2013-06-04 NOTE — Patient Instructions (Signed)
Today we updated your med list in our EPIC system...    Continue your current medications the same...    We refilled your meds per request...  Keep up your good work w/ diet & exercise...  Call for any questions...  Let's plan a follow up visit in 52yr, sooner if needed for problems.Marland KitchenMarland Kitchen

## 2013-06-08 NOTE — Telephone Encounter (Signed)
Please advise thanks.

## 2013-06-10 NOTE — Telephone Encounter (Signed)
Called and spoke with Alan [name] and he is aware that papers have not been received.  Alan [name] will refax these forms today.

## 2013-06-11 ENCOUNTER — Telehealth: Payer: Self-pay | Admitting: Pulmonary Disease

## 2013-06-15 NOTE — Telephone Encounter (Signed)
Forms have been received and we will have SN review these. thanks

## 2013-06-15 NOTE — Telephone Encounter (Signed)
Forms placed on SN desk to review.

## 2013-07-03 ENCOUNTER — Other Ambulatory Visit: Payer: Medicare Other

## 2013-07-10 ENCOUNTER — Telehealth: Payer: Self-pay | Admitting: Pulmonary Disease

## 2013-07-10 NOTE — Telephone Encounter (Signed)
According to 06/15/13 phone note SN had these. Please advise if these have been completed. thanks

## 2013-07-10 NOTE — Telephone Encounter (Signed)
These forms are in SN sign folder.  SN will review and we will fax back once completed.

## 2013-07-14 ENCOUNTER — Telehealth: Payer: Self-pay | Admitting: Pulmonary Disease

## 2013-07-14 NOTE — Telephone Encounter (Signed)
Called and spoke with [facility] and they will mark in the pts chart that SN normally will discuss at ov with pt about these forms.  Pt does not have pending appt.  [facility] is aware and nothing further is needed.

## 2013-07-27 ENCOUNTER — Telehealth: Payer: Self-pay | Admitting: Pulmonary Disease

## 2013-07-27 MED ORDER — METHYLPREDNISOLONE 4 MG PO KIT: PACK | ORAL | Status: DC

## 2013-07-27 MED ORDER — AZITHROMYCIN 250 MG PO TABS: ORAL_TABLET | ORAL | Status: AC

## 2013-07-27 NOTE — Telephone Encounter (Signed)
Pt is aware of SN's recs. Pt is aware. Rx's have been sent in.

## 2013-07-27 NOTE — Telephone Encounter (Signed)
Called spoke with patient who c/o head congestion, PND, wheezing, increased SOB, chest tightness, dry cough x3 days - denies f/c/s, nausea, vomiting, hemoptysis, edema.  Has been taking metamucil, mucinex.  Pt is requesting rx be called in [place]  Allergen Reactions  . Codeine Other (See Comments)    REACTION: hallucinations,faint,dizziness   Dr Lenna Gilford please advise, thank you. Last ov 12.14.14

## 2013-07-27 NOTE — Telephone Encounter (Signed)
Per SN---  zpak #1  Take as directed Medrol dosepak #1  Take as directed

## 2013-10-16 ENCOUNTER — Telehealth: Payer: Self-pay | Admitting: Gastroenterology

## 2013-10-16 ENCOUNTER — Telehealth: Payer: Self-pay | Admitting: Pulmonary Disease

## 2013-10-16 ENCOUNTER — Telehealth: Payer: Self-pay | Admitting: *Deleted

## 2013-10-16 MED ORDER — LINACLOTIDE 145 MCG PO CAPS: 145.0000 ug | ORAL_CAPSULE | Freq: Every day | ORAL | Status: DC

## 2013-10-16 NOTE — Telephone Encounter (Signed)
Called pt and offered samples of Linzess until Josem Kaufmann is completed

## 2013-10-16 NOTE — Telephone Encounter (Signed)
Called spoke w/ pt. He was needing refill on linzess 145 mg. I advised him this was not on current medication list and according to epic it shows Dr. Deatra Ina prescribed this for him in the past. He will call his office. Nothing further needed

## 2013-10-16 NOTE — Telephone Encounter (Signed)
Called pt to inform med sent to pharmacy

## 2013-10-19 NOTE — Telephone Encounter (Signed)
error 

## 2013-10-21 ENCOUNTER — Encounter: Payer: Self-pay | Admitting: *Deleted

## 2013-10-21 NOTE — Telephone Encounter (Signed)
Prior auth done today and faxed

## 2013-11-02 ENCOUNTER — Telehealth: Payer: Self-pay | Admitting: Critical Care Medicine

## 2013-11-02 NOTE — Telephone Encounter (Signed)
Opened in error

## 2013-11-03 NOTE — Telephone Encounter (Signed)
LEFT MESSAGE TO SEE IF PATIENT HAS HEARD FROM INSURANCE CO.   I HAVE NOT HEARD BACK HAVE FAXED OVER FORM TWICE

## 2013-12-01 ENCOUNTER — Encounter (HOSPITAL_COMMUNITY): Payer: Self-pay | Admitting: Emergency Medicine

## 2013-12-01 ENCOUNTER — Emergency Department (HOSPITAL_COMMUNITY)
Admission: EM | Admit: 2013-12-01 | Discharge: 2013-12-01 | Disposition: A | Discharge status: Home / Self Care | Payer: Medicare HMO | Source: Home / Self Care | Attending: Family Medicine | Admitting: Family Medicine

## 2013-12-01 ENCOUNTER — Emergency Department (INDEPENDENT_AMBULATORY_CARE_PROVIDER_SITE_OTHER): Payer: Medicare HMO

## 2013-12-01 DIAGNOSIS — S93602A Unspecified sprain of left foot, initial encounter: Secondary | ICD-10-CM

## 2013-12-01 DIAGNOSIS — S93609A Unspecified sprain of unspecified foot, initial encounter: Secondary | ICD-10-CM

## 2013-12-01 DIAGNOSIS — W010XXA Fall on same level from slipping, tripping and stumbling without subsequent striking against object, initial encounter: Secondary | ICD-10-CM

## 2013-12-01 NOTE — ED Notes (Signed)
Pt reports slipping and falling injuring left great toe yesterday evening.   States "feels like it's broken".

## 2013-12-01 NOTE — ED Provider Notes (Signed)
CSN: [id number]     Arrival date & time 12/01/13  1039 History   First MD Initiated Contact with Patient 12/01/13 1156     Chief Complaint  Patient presents with  . Toe Injury   (Consider location/radiation/quality/duration/timing/severity/associated sxs/prior Treatment) Patient is a 73 y.o. male presenting with foot injury. The history is provided by the patient.  Foot Injury Location:  Foot Injury: yes   Mechanism of injury comment:  Tripped while chasing his 3 y/o grandchild Foot location:  L foot Pain details:    Quality:  Aching   Radiates to:  Does not radiate   Severity:  Mild Chronicity:  New Dislocation: no   Foreign body present:  No foreign bodies Associated symptoms comment:  None   Past Medical History  Diagnosis Date  . Sleep apnea     does not use cpap  . Unspecified essential hypertension   . Hypercholesteremia   . GERD (gastroesophageal reflux disease)   . Diverticulosis of colon (without mention of hemorrhage)   . Irritable bowel syndrome   . Benign neoplasm of colon     colon polyps  . Benign hypertrophy of prostate   . Erectile dysfunction   . Restless legs syndrome (RLS)   . Anxiety state, unspecified   . Onychomycosis   . Constipation, chronic    Past Surgical History  Procedure Laterality Date  . None    . Colonoscopy    . Laparoscopic appendectomy N/A 01/27/2013    Procedure: APPENDECTOMY LAPAROSCOPIC;  Surgeon: Edward Jolly, MD;  Location: [facility];  Service: General;  Laterality: N/A;  . Appendectomy  12/2012   Family History  Problem Relation Age of Onset  . Colon cancer Paternal Grandfather 23  . Cancer Paternal Grandfather     prostate  . Cancer Father     prostate  . Cancer Brother     prostate   History  Substance Use Topics  . Smoking status: Former Smoker -- 2.00 packs/day for 15 years    Types: Cigarettes    Quit date: 07/02/1968  . Smokeless tobacco: Never Used  . Alcohol Use: No    Review of Systems  All other  systems reviewed and are negative.   Allergies  Codeine  Home Medications   Prior to Admission medications   Medication Sig Start Date End Date Taking? Authorizing Provider  amLODipine (NORVASC) 5 MG tablet Take 1 tablet (5 mg total) by mouth every morning. 06/04/13  Yes [name], MD  aspirin EC 81 MG tablet Take 81 mg by mouth every morning.    Yes Historical Provider, MD  finasteride ([name]) 5 MG tablet Take 1 tablet (5 mg total) by mouth every morning. 06/04/13  Yes [name], MD  omeprazole (PRILOSEC) 20 MG capsule Take 1 capsule (20 mg total) by mouth every morning. 06/04/13  Yes [name], MD  simvastatin (ZOCOR) 20 MG tablet Take 1 tablet (20 mg total) by mouth every morning. 06/04/13  Yes [name], MD  brimonidine-timolol (COMBIGAN) 0.2-0.5 % ophthalmic solution Place 1 drop into both eyes every 12 (twelve) hours.    Historical Provider, MD  fluticasone (FLONASE) 50 MCG/ACT nasal spray Place 2 sprays into both nostrils daily. 06/04/13   [name], MD  lactulose (CHRONULAC) 10 GM/15ML solution Take 15 mLs by mouth 2 (two) times daily as needed (bowel movement).    Historical Provider, MD  Linaclotide Rolan Lipa) 145 MCG CAPS capsule Take 1 capsule (145 mcg total) by  mouth daily. 10/16/13   Inda Castle, MD  methylPREDNISolone (MEDROL DOSEPAK) 4 MG tablet follow package directions 07/27/13   [name], MD  polyethylene glycol [facility] / Floria Raveling) packet Take 17 g by mouth every morning.     Historical Provider, MD  tamsulosin (FLOMAX) 0.4 MG CAPS capsule Take 1 capsule (0.4 mg total) by mouth every evening. 06/04/13   [name], MD   BP 109/69  Pulse 90  Temp(Src) 98 F (36.7 C) (Oral)  Resp 18  SpO2 97% Physical Exam  Nursing note and vitals reviewed. Constitutional: He is oriented to person, place, and time. He appears well-developed and well-nourished. No distress.  HENT:  Head: Normocephalic and atraumatic.  Eyes: Conjunctivae are normal. No  scleral icterus.  Cardiovascular: Normal rate.   Pulmonary/Chest: Effort normal.  Musculoskeletal: Normal range of motion.       Left foot: He exhibits tenderness and swelling. He exhibits normal range of motion, no bony tenderness, normal capillary refill, no crepitus, no deformity and no laceration.       Feet:  Neurological: He is alert and oriented to person, place, and time.  Skin: Skin is warm and dry.  Psychiatric: He has a normal mood and affect. His behavior is normal.    ED Course  Procedures (including critical care time) Labs Review Labs Reviewed - No data to display  Imaging Review Dg Foot Complete Left  12/01/2013   CLINICAL DATA:  Fall.  EXAM: LEFT FOOT - COMPLETE 3+ VIEW  COMPARISON:  None.  FINDINGS: There is no evidence of fracture or dislocation. There is no evidence of arthropathy or other focal bone abnormality. Soft tissues are unremarkable.  IMPRESSION: Negative.   Electronically Signed   By: Marcello Moores  Register   On: 12/01/2013 12:56     MDM   1. Sprain of left foot    Films negative for acute injury. Post op shoe as needed for comfort. RICE therapy and NSAIDs as needed for pain and swelling. Follow up as needed if no improvement.    [place], [place] 12/01/13 1310

## 2013-12-01 NOTE — Discharge Instructions (Signed)
Your xrays were without indication of fracture or dislocation. Ibuprofen or tylenol as directed on packaging for pain. Ice and elevation to reduce swelling. Post op shoe as needed for comfort. Follow up for re-evaluation if no improvement over the next 1-2 weeks.  Foot Sprain The muscles and cord like structures which attach muscle to bone (tendons) that surround the feet are made up of units. A foot sprain can occur at the weakest spot in any of these units. This condition is most often caused by injury to or overuse of the foot, as from playing contact sports, or aggravating a previous injury, or from poor conditioning, or obesity. SYMPTOMS  Pain with movement of the foot.  Tenderness and swelling at the injury site.  Loss of strength is present in moderate or severe sprains. THE THREE GRADES OR SEVERITY OF FOOT SPRAIN ARE:  Mild (Grade I): Slightly pulled muscle without tearing of muscle or tendon fibers or loss of strength.  Moderate (Grade II): Tearing of fibers in a muscle, tendon, or at the attachment to bone, with small decrease in strength.  Severe (Grade III): Rupture of the muscle-tendon-bone attachment, with separation of fibers. Severe sprain requires surgical repair. Often repeating (chronic) sprains are caused by overuse. Sudden (acute) sprains are caused by direct injury or over-use. DIAGNOSIS  Diagnosis of this condition is usually by your own observation. If problems continue, a caregiver may be required for further evaluation and treatment. X-rays may be required to make sure there are not breaks in the bones (fractures) present. Continued problems may require physical therapy for treatment. PREVENTION  Use strength and conditioning exercises appropriate for your sport.  Warm up properly prior to working out.  Use athletic shoes that are made for the sport you are participating in.  Allow adequate time for healing. Early return to activities makes repeat injury more  likely, and can lead to an unstable arthritic foot that can result in prolonged disability. Mild sprains generally heal in 3 to 10 days, with moderate and severe sprains taking 2 to 10 weeks. Your caregiver can help you determine the proper time required for healing. HOME CARE INSTRUCTIONS   Apply ice to the injury for 15-20 minutes, 03-04 times per day. Put the ice in a plastic bag and place a towel between the bag of ice and your skin.  An elastic wrap (like an Ace bandage) may be used to keep swelling down.  Keep foot above the level of the heart, or at least raised on a footstool, when swelling and pain are present.  Try to avoid use other than gentle range of motion while the foot is painful. Do not resume use until instructed by your caregiver. Then begin use gradually, not increasing use to the point of pain. If pain does develop, decrease use and continue the above measures, gradually increasing activities that do not cause discomfort, until you gradually achieve normal use.  Use crutches if and as instructed, and for the length of time instructed.  Keep injured foot and ankle wrapped between treatments.  Massage foot and ankle for comfort and to keep swelling down. Massage from the toes up towards the knee.  Only take over-the-counter or prescription medicines for pain, discomfort, or fever as directed by your caregiver. SEEK IMMEDIATE MEDICAL CARE IF:   Your pain and swelling increase, or pain is not controlled with medications.  You have loss of feeling in your foot or your foot turns cold or blue.  You develop new,  unexplained symptoms, or an increase of the symptoms that brought you to your caregiver. MAKE SURE YOU:   Understand these instructions.  Will watch your condition.  Will get help right away if you are not doing well or get worse. Document Released: 12/08/2001 Document Revised: 09/10/2011 Document Reviewed: 02/05/2008 [facility] Patient Information 2014  [place], [place].

## 2013-12-01 NOTE — ED Provider Notes (Signed)
Medical screening examination/treatment/procedure(s) were performed by resident physician or non-physician practitioner and as supervising physician I was immediately available for consultation/collaboration.   Pauline Good MD.   Billy Fischer, MD 12/01/13 [phone]

## 2014-03-01 ENCOUNTER — Telehealth: Payer: Self-pay | Admitting: Pulmonary Disease

## 2014-03-01 NOTE — Telephone Encounter (Signed)
Called and spoke to the pts wife and she is aware of appt with SN tomorrow at 10.  Nothing further is needed.

## 2014-03-01 NOTE — Telephone Encounter (Signed)
Per SN--  If weak and in pain, hard to move, then he will need to go to ER for prompt eval.  If not an emergency--ok to add on tomorrow at 10.  lmomtcb x 1

## 2014-03-01 NOTE — Telephone Encounter (Signed)
Called spoke with spouse. She is concerned regarding pt. He has been having spells and feeling faint/weak. Also c/o neck pain/spasm in neck and not able to move in a certain way.  Please advise SN thanks

## 2014-03-01 NOTE — Telephone Encounter (Signed)
[phone] calling back again

## 2014-03-02 ENCOUNTER — Ambulatory Visit (INDEPENDENT_AMBULATORY_CARE_PROVIDER_SITE_OTHER): Payer: Medicare HMO | Admitting: Pulmonary Disease

## 2014-03-02 VITALS — BP 138/70 | HR 88 | Temp 97.6°F | Ht 67.0 in | Wt 170.6 lb

## 2014-03-02 DIAGNOSIS — K5909 Other constipation: Secondary | ICD-10-CM

## 2014-03-02 DIAGNOSIS — Z87898 Personal history of other specified conditions: Secondary | ICD-10-CM

## 2014-03-02 DIAGNOSIS — K5730 Diverticulosis of large intestine without perforation or abscess without bleeding: Secondary | ICD-10-CM

## 2014-03-02 DIAGNOSIS — F411 Generalized anxiety disorder: Secondary | ICD-10-CM

## 2014-03-02 DIAGNOSIS — K589 Irritable bowel syndrome without diarrhea: Secondary | ICD-10-CM

## 2014-03-02 DIAGNOSIS — I10 Essential (primary) hypertension: Secondary | ICD-10-CM

## 2014-03-02 DIAGNOSIS — E7800 Pure hypercholesterolemia, unspecified: Secondary | ICD-10-CM

## 2014-03-02 DIAGNOSIS — G2581 Restless legs syndrome: Secondary | ICD-10-CM

## 2014-03-02 DIAGNOSIS — R071 Chest pain on breathing: Secondary | ICD-10-CM

## 2014-03-02 DIAGNOSIS — K2100 Gastro-esophageal reflux disease with esophagitis, without bleeding: Secondary | ICD-10-CM

## 2014-03-02 NOTE — Patient Instructions (Signed)
Today we updated your med list in our EPIC system...    Continue your current medications the same...  We discussed a week-long trial off one med at a time to see if your symptoms abate...    Let me know how you are doing w/ this trial...  Today we did a follow up EKG... Please return to our lab one morning this week for your FASTING blood work...    We will contact you w/ the results when available...   Keep up the exercise & activity level...  Continue your follow up w/ Urology- DrTennenbaum...  Call for any questions or if we can be of service in any way.Marland KitchenMarland Kitchen

## 2014-03-03 ENCOUNTER — Other Ambulatory Visit (INDEPENDENT_AMBULATORY_CARE_PROVIDER_SITE_OTHER): Payer: Medicare HMO

## 2014-03-03 DIAGNOSIS — E7800 Pure hypercholesterolemia, unspecified: Secondary | ICD-10-CM

## 2014-03-03 DIAGNOSIS — I10 Essential (primary) hypertension: Secondary | ICD-10-CM

## 2014-03-03 DIAGNOSIS — K5730 Diverticulosis of large intestine without perforation or abscess without bleeding: Secondary | ICD-10-CM

## 2014-03-03 LAB — CBC WITH DIFFERENTIAL/PLATELET
BASOS ABS: 0 10*3/uL (ref 0.0–0.1)
BASOS PCT: 0.4 % (ref 0.0–3.0)
EOS ABS: 0.1 10*3/uL (ref 0.0–0.7)
Eosinophils Relative: 1.2 % (ref 0.0–5.0)
HCT: 44.1 % (ref 39.0–52.0)
HEMOGLOBIN: 14.6 g/dL (ref 13.0–17.0)
LYMPHS PCT: 39.9 % (ref 12.0–46.0)
Lymphs Abs: 1.8 10*3/uL (ref 0.7–4.0)
MCHC: 33.2 g/dL (ref 30.0–36.0)
MCV: 87.5 fl (ref 78.0–100.0)
Monocytes Absolute: 0.4 10*3/uL (ref 0.1–1.0)
Monocytes Relative: 9.2 % (ref 3.0–12.0)
NEUTROS PCT: 49.3 % (ref 43.0–77.0)
Neutro Abs: 2.2 10*3/uL (ref 1.4–7.7)
Platelets: 265 10*3/uL (ref 150.0–400.0)
RBC: 5.04 Mil/uL (ref 4.22–5.81)
RDW: 14.7 % (ref 11.5–15.5)
WBC: 4.5 10*3/uL (ref 4.0–10.5)

## 2014-03-03 LAB — LIPID PANEL
CHOLESTEROL: 135 mg/dL (ref 0–200)
HDL: 38.2 mg/dL — AB (ref >39.00)
LDL Cholesterol: 84 mg/dL (ref 0–99)
NonHDL: 96.8
TRIGLYCERIDES: 66 mg/dL (ref 0.0–149.0)
Total CHOL/HDL Ratio: 4
VLDL: 13.2 mg/dL (ref 0.0–40.0)

## 2014-03-03 LAB — HEPATIC FUNCTION PANEL
ALT: 17 U/L (ref 0–53)
AST: 16 U/L (ref 0–37)
Albumin: 3.8 g/dL (ref 3.5–5.2)
Alkaline Phosphatase: 42 U/L (ref 39–117)
BILIRUBIN DIRECT: 0.1 mg/dL (ref 0.0–0.3)
TOTAL PROTEIN: 6.8 g/dL (ref 6.0–8.3)
Total Bilirubin: 0.8 mg/dL (ref 0.2–1.2)

## 2014-03-03 LAB — BASIC METABOLIC PANEL
BUN: 22 mg/dL (ref 6–23)
CALCIUM: 9.2 mg/dL (ref 8.4–10.5)
CO2: 26 meq/L (ref 19–32)
CREATININE: 1.3 mg/dL (ref 0.4–1.5)
Chloride: 110 mEq/L (ref 96–112)
GFR: 68.87 mL/min (ref >60.00)
GLUCOSE: 103 mg/dL — AB (ref 70–99)
Potassium: 4.1 mEq/L (ref 3.5–5.1)
Sodium: 143 mEq/L (ref 135–145)

## 2014-03-04 LAB — TSH: TSH: 0.02 u[IU]/mL — ABNORMAL LOW (ref 0.35–4.50)

## 2014-03-06 ENCOUNTER — Encounter: Payer: Self-pay | Admitting: Pulmonary Disease

## 2014-03-06 NOTE — Progress Notes (Addendum)
Subjective:     Patient ID: Alan [name], male   DOB: 1941/06/18, 73 y.o.   MRN: [id number]  HPI [name] here for a follow up visit... he has mult med problems as noted below...   ~  August 31, 2010:  2 year ROV & review> requests 90d refills today...    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    He had GI eval from [facility] 1/11 w/ EGD showing mult GE erosions & duodenitis- treated w/ PPI Prilosec & improved;  colonoscopy at the same time showed several polyps- benign mucosa w/o adenomatous change seen...    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    [facility];  also had sharp CWP from the cough, slowly improved over time...    Finally, he was dx w/ Glaucoma on [name] gtts per [name] & may need laser surg...  ~  April 03, 2012:  169moOV & RoThaddeauss c/o a recent URI w/ dry cough, congestion, some drainage, but no f/c/s/ SOB/ CP/ etc; rec to treat w/ ZPak, cough drops, Mucinex, fluids, Tylenol, etc...     HBP>  On Norvasc5; BP= 118/82 & he denies CP, palpit, SOB, edema, etc...    CHOL>  On Simva20 + diet; FLP showed TChol 146, TG 68, HDL 45, LDL 88; continue same med & diet...    GERD>  On Prilosec20; he denied abd pain, n/v, dysphagia, etc...    Divertics, IBS w/ constip, Colon Polyps>  On Linzess145 per [name] & this really helps; up to date on colon etc...    BPH>  On [name]5 & Flomax0.4; followed by [name] w/ rec for  [facility]...    RLS>  Not on meds his symptoms are not that bad & he is managing well "I deal w/ it- walking helps"...    Anxiety>  Not on meds and states he is doing well overall... We reviewed prob list, meds, xrays and labs> see below for updates >> OK 2013 flu vaccine today & refills of meds... LABS 10/13:  FLP- at goals on Simva20;  Chems- wnl x BS=108;  CBC- wnl;  TSH=0.34 & we will recheck w/ FreeT3 & FreeT4... ADDENDUM>> f/u TFTs 10/13 showed normal TSH=0.93 & normal FreeT3 & FreeT4...   ~  June 04, 2013:  1438moV & [name] a prob w/ recurrent RLQ pain and finally had a Laparoscopic appendectomy 8/14 by DrHoxworth- now feeling 100% better he says and back to normal... We reviewed the following medical problems during today's office visit >>     HBP>  On ASA81, Norvasc5; BP= 120/84 & he denies CP, palpit, SOB, edema, etc...    CHOL>  On Simva20 + diet; FLP 10/13 showed TChol 146, TG 68, HDL 45, LDL 88; continue same med &  diet.Marland KitchenMarland Kitchen    GERD>  On Prilosec20; he had LapAppy 8/14 by DrHoxworth & now back to baseline w/o abd pain, n/v, c/d, blood seen...    Divertics, IBS w/ constip, Colon Polyps>  Off prev Linzess, Chronulac, Miralax; had colonoscopy 2/14 w/ int hems, otherw wnl, & f/u rec in 74yr...    BPH>  On [name]5 & Flomax0.4; followed by [name] (he does pt's PSA) w/ [facility]...    RLS>  Not on meds his symptoms are not that bad & he is managing well "I deal w/ it- walking helps"...    Anxiety>  Not on meds and states he is doing well overall... We reviewed prob list, meds, xrays and labs> see below for updates >> he had the 2014 flu vaccine 10/14... Refill of all meds for 90d supplies...  [place] July[place]Aug[place]..   ~  March 02, 2014:  929moOV & add-on appt requested by pt's wife for mult complaints (he says he's ok, no big deal)- pain in left side of neck & shoulder  (he thinks it's from his air conditioning); indigestion (he thinks it's panic attacks);  SOB/DOE going up stairs (he feels it's his meds- "the meds catch up w/ me"); he notes that he is very active- yard, garden, voNature conservation officerork at chCapital Onewe discussed his options- doesn't want anxiolytic, he wants to stop one med at a time to see if the SOB improves- OK... We reviewed the following medical problems during today's office visit >>     HBP>  On ASA81, Norvasc5; BP= 138/70 & he denies CP, palpit, SOB, edema, etc...    CHOL>  On Simva20 + diet; FLP 9/15 showed TChol 135, TG 66, HDL 38, LDL 84; continue same med & diet...    GERD>  On Prilosec20; he had LapAppy 8/14 by DrHoxworth & now back to baseline w/o abd pain, n/v, c/d, blood seen...    Divertics, IBS w/ constip, Colon Polyps>  Off prev Linzess, Chronulac, Miralax; had colonoscopy 2/14 w/ int hems, otherw wnl, & f/u rec in 7y67yr.    BPH>  On [name]5 & Flomax0.4; followed by [name] (he does pt's PSA) w/ rec [facility]P [facility]; pt tells me he was seen 7/15 & they plan Bx of prostate soon (we don't have notes).    RLS>  Not on meds his symptoms are not that bad & he is managing well "I deal w/ it- walking helps"...    Anxiety>  Not on meds and states he is doing well overall... We reviewed prob list, meds, xrays and labs> see below for updates >>   EKG 9/15 showed NSR, rate78, wnl...  LABS 9/15:  FLP- wnl on Simva20;  Chems- wnl;  CBC- wnl;  TSH=0.02... 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          Problem List:   OBSTRUCTIVE SLEEP APNEA (ICD-327.23) - s/p sleep study 1/08 showing RDI=16, and desat to 89%... he had 4+leg jerks and given trial Requip (?helped)... states he rests well and wakes feeling refreshed most of  the time "I live w/ it, it's not that bad"...  HYPERTENSION (ICD-401.9) - controlled on NORVASC 5mg30m and prev on Cardura but this was switched by  [name]...  ~  10/13: BP=118/82, tol meds well... denies HA, fatigue, visual changes, CP, palipit, dizziness, syncope, dyspnea, edema, etc... he had a Cardiolite scan in 09/21/95 that was neg/ normal w/ EF=75%... ~  CXR 4/14 showed norm heart size, atherosclerotic in ThorAo, clear lungs, NAD... ~  12/14: on ASA81, Norvasc5; BP= 120/84 & he denies CP, palpit, SOB, edema, etc ~  9/15: on ASA81, Norvasc5; BP= 138/70 & he denies CP, palpit, SOB, edema, etc  HYPERCHOLESTEROLEMIA (ICD-272.0) - on SIMVASTATIN 64m/d + diet...  ~  [place]/08 showed TChol 157, TG 53, HDL 53, LDL 94 ~  FLP 3/09 showed TChol 124, TG 33, HDL 44, LDL 74 ~  FLP 4/10 showed TChol 123, TG 36, HDL 41, LDL 75 ~  FLP 3/12 showed TChol 148, TG 47, HDL 49, LDL 89... continue Simva20. ~  [place] Sim[place]20 showed TChol 146, TG 68, HDL 45, LDL 88... Continue same. ~  FLP 9/15 on Simva20 showed TChol 135, TG 66, HDL 38, LDL 84  SUBCLINICAL HYPERTHYROIDISM >> see notes above...  ~  Labs 9/15 showed TSH=0.02 and repeat labs showed TSH=0.03, FreeT3=3.5 (2.3-4.2), FreeT4=1.09 (0.6-1.6) => we will refer to endocrine for further eval...  GERD (ICD-530.81) - prev rec to take Prevacid 325m& Zantac 150Qhs... GI eval 1/11 by DrDemetra Shiner/ rec to take PRILOSEC daily ~  EGD 5/02 showing esophagitis and gastritis... ~  EGD 1/11 showed GE junction erosions & duodenitis... Rx Prilosec & improved symptomatically.  DIVERTICULOSIS OF COLON (ICD-562.10) - on Metamucil daily...  IRRITABLE BOWEL SYNDROME w/ CONSTIPATION > on LINZESS 14528m per GI... COLONIC POLYPS (ICD-211.3) ~  colonoscopy 5/07 by DrSam showing divertics and 3-4mm49menomatous polyps removed... f/u planned 89yrs35yr~  colonoscopy 1/11 w/ several pedunculated polyps & path was neg- benign colonic mucosa w/o adenomatous change. ~   Colonoscopy 2/14 by [name] showed int hems, otherw neg, and f/u rec in 7 yrs...  ~  5/14: initial hosp for RLQ pain & dx w/ appendicitis- treated non-operatively ~  CT Abd & Pelvis 7/14 showed suspected acute on chr perforated appendicitis w/ sm periappendiceal fluid collection, no free air; enlarged prostate, mild degen changes in Tspine, sm bilat renal cysts...   BENIGN PROSTATIC HYPERTROPHY, HX OF (ICD-V13.8) - seen by DrHumphries previously... now seeing [name] but pt doesn't feel that he is doing as well on the FLOMAWilson [facility] Cent[facility]/d52mrev on [name] + Cardura)... I've encouraged him to discuss this w/ [name]... 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 ~  5/14:  He was seen by [name] during his 5/14 hosp for appendicitis; CT showed prostatic enlargement, BPH, BOO, & he was rec to f/u as outpt for poss cysto... ~  9/15:  He tells me he saw [name] 7/15 w/ elev PSA & they plan prostate bx soon (we don't have recent notes from them)...  ERECTILE DYSFUNCTION (ICD-302.72) - Tried all 3 PDE4 inhibitors, but not much benefit... consider shots.  ~  labs in 2010 at03[facility]22/10log[facility] office was 1.47 per patient. ~  2001: hMar 22, 2001lls me [facility]"...  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  ~  MRI Brain 8/11 showed a few nonspecific areas of bifrontal gliosis and a partially empty sella...   RESTLESS LEG SYNDROME (ICD-333.94) - prev on Requip 31mQhs, but stopped this med due to stomach side effects  ... he notes no diff w/ resting- good nights, and bad nights...   ANXIETY (ICD-300.00) - he does not desire anxiolytic Rx...  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   Past Medical History  Diagnosis Date  . Sleep apnea     does not use cpap  . Unspecified essential hypertension   . Hypercholesteremia   . GERD (gastroesophageal reflux disease)   . Diverticulosis of colon (without mention of hemorrhage)   . Irritable bowel syndrome   . Benign neoplasm of colon     colon polyps  . Benign hypertrophy of prostate   . Erectile dysfunction   . Restless legs syndrome (RLS)   . Anxiety state, unspecified   . Onychomycosis   . Constipation, chronic     Past Surgical History  Procedure Laterality Date  . None    . Colonoscopy    . Laparoscopic appendectomy N/A 01/27/2013    Procedure: APPENDECTOMY LAPAROSCOPIC;  Surgeon: BEdward Jolly MD;  Location: [facility];  Service: General;  Laterality: N/A;  . Appendectomy  12/2012    Outpatient Encounter Prescriptions as of 03/02/2014  Medication Sig  . amLODipine (NORVASC) 5 MG tablet Take 1 tablet (5 mg total) by mouth every morning.  .Marland Kitchenaspirin EC 81 MG tablet Take 81 mg by mouth every morning.   . brimonidine-timolol (COMBIGAN) 0.2-0.5 % ophthalmic solution Place 1 drop into both eyes every 12 (twelve) hours.  . finasteride ([name]) 5 MG tablet Take 1 tablet (5 mg total) by mouth every morning.  . fluticasone (FLONASE) 50 MCG/ACT nasal spray Place 2 sprays into both nostrils daily.  .Marland Kitchenlactulose (CHRONULAC) 10 GM/15ML solution Take 15 mLs by mouth 2 (two) times daily as needed (bowel movement).  .Marland Kitchenomeprazole (PRILOSEC) 20 MG capsule Take 1 capsule (20 mg total) by mouth every morning.  . polyethylene glycol (MIRALAX / GLYCOLAX) packet Take 17 g by mouth every morning.   . simvastatin (ZOCOR) 20 MG tablet Take 1 tablet (20 mg total) by mouth every morning.  . tamsulosin (FLOMAX) 0.4 MG CAPS capsule Take 1 capsule  (0.4 mg total) by mouth every evening.  . Linaclotide (LINZESS) 145 MCG CAPS capsule Take 1 capsule (145 mcg total) by mouth daily.  . [DISCONTINUED] methylPREDNISolone (MEDROL DOSEPAK) 4 MG tablet follow package directions    Allergies  Allergen Reactions  . Codeine Other (See Comments)    REACTION: hallucinations,faint,dizziness    Current Medications, Allergies, Past Medical History, Past Surgical History, Family History, and Social History were reviewed in [employer] Energyrecord.   Review of Systems        See HPI - all other systems neg except as noted... The patient denies anorexia, fever, weight loss, weight gain, vision loss, decreased hearing, hoarseness, chest pain, syncope, dyspnea on exertion, peripheral edema, prolonged cough, headaches, hemoptysis, abdominal pain, melena, hematochezia, severe indigestion/heartburn, hematuria, incontinence, muscle weakness, suspicious skin lesions, transient blindness, difficulty walking, depression, unusual weight change, abnormal bleeding, enlarged lymph nodes, and angioedema.     Objective:   Physical Exam    WD, WN, 73y/o BM in NAD..Marland KitchenMarland Kitchen  GENERAL:  Alert & oriented; pleasant & cooperative... HEENT:  [place]/AT, EOM-wnl, PERRLA, EACs-clear, TMs-wnl, NOSE-clear, THROAT-clear & wnl. NECK:  Supple w/ full ROM; no JVD; normal carotid impulses w/o bruits; no thyromegaly or nodules palpated; no lymphadenopathy. CHEST:  Clear to P & A; without wheezes/ rales/ or rhonchi. HEART:  Regular Rhythm; without murmurs/ rubs/ or gallops. ABDOMEN:  Soft & nontender; normal bowel sounds; no organomegaly or masses detected. EXT: without deformities, mild arthritic changes; no varicose veins/ venous insuffic/ or edema. NEURO:  CN's intact; motor testing normal; sensory testing normal; gait normal & balance OK. DERM:  No lesions noted; no rash etc...  RADIOLOGY DATA:  Reviewed in the EPIC EMR & discussed w/ the patient...  LABORATORY DATA:   Reviewed in the EPIC EMR & discussed w/ the patient...   Assessment:      HBP>  Well controlled on Amlod5; continue same + diet & low sodium...  CHOL>  Well regulated on Simva20; continue same...  ABNORMAL TSH>  Labs 9/15 w/ suppressed TSH- he will ret for additional testing to r/o hyperthyroidism...  GERD>  Stable on Prilosec20; continue same Rx...  Divertics, IBS, Colon Polyps>  He notes improvement in his chr constipation on Linzess 145...  S/P Appendectomy 8/14 by DrHoxworth...  BPH>  Followed by [name] on Flomax & [name]; he has declined TURP surg, they do his PSAs & pt reports that PSA was elev 7 they plan Bx soon...  RLS>  States it's not that bad & not requiring meds...  Anxiety>  Similarly, he notes not that bad & not on meds...     Plan:     Patient's Medications  New Prescriptions   No medications on file  Previous Medications   AMLODIPINE (NORVASC) 5 MG TABLET    Take 1 tablet (5 mg total) by mouth every morning.   ASPIRIN EC 81 MG TABLET    Take 81 mg by mouth every morning.    BRIMONIDINE-TIMOLOL (COMBIGAN) 0.2-0.5 % OPHTHALMIC SOLUTION    Place 1 drop into both eyes every 12 (twelve) hours.   FINASTERIDE ([name]) 5 MG TABLET    Take 1 tablet (5 mg total) by mouth every morning.   FLUTICASONE (FLONASE) 50 MCG/ACT NASAL SPRAY    Place 2 sprays into both nostrils daily.   LACTULOSE (CHRONULAC) 10 GM/15ML SOLUTION    Take 15 mLs by mouth 2 (two) times daily as needed (bowel movement).   LINACLOTIDE (LINZESS) 145 MCG CAPS CAPSULE    Take 1 capsule (145 mcg total) by mouth daily.   OMEPRAZOLE (PRILOSEC) 20 MG CAPSULE    Take 1 capsule (20 mg total) by mouth every morning.   POLYETHYLENE GLYCOL (MIRALAX / GLYCOLAX) PACKET    Take 17 g by mouth every morning.    SIMVASTATIN (ZOCOR) 20 MG TABLET    Take 1 tablet (20 mg total) by mouth every morning.   TAMSULOSIN (FLOMAX) 0.4 MG CAPS CAPSULE    Take 1 capsule (0.4 mg total) by mouth every evening.  Modified  Medications   No medications on file  Discontinued Medications   METHYLPREDNISOLONE (MEDROL DOSEPAK) 4 MG TABLET    follow package directions

## 2014-03-18 ENCOUNTER — Other Ambulatory Visit: Payer: Self-pay | Admitting: Pulmonary Disease

## 2014-03-18 DIAGNOSIS — F411 Generalized anxiety disorder: Secondary | ICD-10-CM

## 2014-03-18 DIAGNOSIS — E0590 Thyrotoxicosis, unspecified without thyrotoxic crisis or storm: Secondary | ICD-10-CM

## 2014-03-19 ENCOUNTER — Telehealth: Payer: Self-pay | Admitting: Critical Care Medicine

## 2014-03-19 ENCOUNTER — Other Ambulatory Visit (INDEPENDENT_AMBULATORY_CARE_PROVIDER_SITE_OTHER): Payer: Medicare HMO

## 2014-03-19 DIAGNOSIS — E0590 Thyrotoxicosis, unspecified without thyrotoxic crisis or storm: Secondary | ICD-10-CM

## 2014-03-19 DIAGNOSIS — F411 Generalized anxiety disorder: Secondary | ICD-10-CM

## 2014-03-19 LAB — T3, FREE: T3, Free: 3.5 pg/mL (ref 2.3–4.2)

## 2014-03-19 LAB — TSH: TSH: 0.03 u[IU]/mL — AB (ref 0.35–4.50)

## 2014-03-19 LAB — T4, FREE: FREE T4: 1.09 ng/dL (ref 0.60–1.60)

## 2014-03-22 ENCOUNTER — Telehealth: Payer: Self-pay | Admitting: Pulmonary Disease

## 2014-03-22 DIAGNOSIS — E0590 Thyrotoxicosis, unspecified without thyrotoxic crisis or storm: Secondary | ICD-10-CM

## 2014-03-22 NOTE — Telephone Encounter (Signed)
Order placed and pt is aware.

## 2014-03-24 ENCOUNTER — Encounter: Payer: Self-pay | Admitting: Internal Medicine

## 2014-03-26 NOTE — Telephone Encounter (Signed)
error 

## 2014-03-29 ENCOUNTER — Telehealth: Payer: Self-pay | Admitting: Pulmonary Disease

## 2014-03-29 NOTE — Telephone Encounter (Signed)
Called pt. Made aware Dr. Annamaria Helling office tried calling. Nothing further needed

## 2014-03-30 ENCOUNTER — Ambulatory Visit (INDEPENDENT_AMBULATORY_CARE_PROVIDER_SITE_OTHER): Payer: Medicare HMO | Admitting: Internal Medicine

## 2014-03-30 VITALS — BP 128/72 | HR 70 | Temp 97.6°F | Resp 12 | Ht 66.0 in | Wt 176.0 lb

## 2014-03-30 DIAGNOSIS — E0590 Thyrotoxicosis, unspecified without thyrotoxic crisis or storm: Secondary | ICD-10-CM

## 2014-03-30 NOTE — Progress Notes (Addendum)
Patient ID: Alan [name], male   DOB: 25-Jan-1941, 73 y.o.   MRN: [id number]   HPI  Alan [name] is a 73 y.o.-year-old male, referred by his PCP, Dr. Lenna [name], in consultation for subclinical hyperthyroidism.  He was found to have a low TSH (normal fT4) at the beginning of this month, during regular labs.  I reviewed pt's thyroid tests: Lab Results  Component Value Date   TSH 0.03* 03/19/2014   TSH 0.02* 03/03/2014   TSH 0.93 04/24/2012   TSH 0.34* 04/03/2012   TSH 0.81 08/31/2010   TSH 0.65 01/09/2010   TSH 0.91 10/19/2008   TSH 1.16 09/18/2007   TSH 1.27 09/18/2006   FREET4 1.09 03/19/2014   FREET4 0.90 04/24/2012    Pt denies feeling nodules in neck, hoarseness, dysphagia/odynophagia, SOB with lying down; he c/o: - no excessive sweating/heat intolerance - no tremors - no anxiety - no palpitations - + fatigue - no hyperdefecation - no weight loss - he noticed more intense RLS lately (has been on med for this, stopped 2 years ago)  Pt does not have a FH of thyroid ds. No FH of thyroid cancer. No h/o radiation tx to head or neck.  No seaweed or kelp, no recent contrast studies. No steroid use. No herbal supplements.   I reviewed his chart and he also has recently been dx with prostate cancer.  ROS: Constitutional: no weight gain/loss, no fatigue, no subjective hyperthermia/hypothermia, + nocturia Eyes: no blurry vision, no xerophthalmia ENT: no sore throat, no nodules palpated in throat, no dysphagia/odynophagia, no hoarseness Cardiovascular: no CP/SOB/palpitations/leg swelling Respiratory: no cough/SOB Gastrointestinal: no N/V/D/+ C Musculoskeletal: no muscle/+ joint aches (L shoulder) Skin: no rashes Neurological: no tremors/numbness/tingling/dizziness Psychiatric: no depression/anxiety + low libido, + diff with erections  Past Medical History  Diagnosis Date  . Sleep apnea     does not use cpap  . Unspecified essential hypertension   . Hypercholesteremia   . GERD  (gastroesophageal reflux disease)   . Diverticulosis of colon (without mention of hemorrhage)   . Irritable bowel syndrome   . Benign neoplasm of colon     colon polyps  . Benign hypertrophy of prostate   . Erectile dysfunction   . Restless legs syndrome (RLS)   . Anxiety state, unspecified   . Onychomycosis   . Constipation, chronic    Past Surgical History  Procedure Laterality Date  . None    . Colonoscopy    . Laparoscopic appendectomy N/A 01/27/2013    Procedure: APPENDECTOMY LAPAROSCOPIC;  Surgeon: Edward Jolly, MD;  Location: [facility];  Service: General;  Laterality: N/A;  . Appendectomy  12/2012   History   Social History  . Marital Status: Married    Spouse Name: N/A    Children: yes   Occupational History  . Retired, Company secretary    Social History Main Topics  . Smoking status: Former Smoker -- 2.00 packs/day for 15 years    Types: Cigarettes    Quit date: 07/02/1968  . Smokeless tobacco: Never Used  . Alcohol Use: No  . Drug Use: No   Current Outpatient Prescriptions on File Prior to Visit  Medication Sig Dispense Refill  . amLODipine (NORVASC) 5 MG tablet Take 1 tablet (5 mg total) by mouth every morning.  90 tablet  3  . aspirin EC 81 MG tablet Take 81 mg by mouth every morning.       . brimonidine-timolol (COMBIGAN) 0.2-0.5 % ophthalmic solution Place 1 drop into  both eyes every 12 (twelve) hours.      . finasteride ([name]) 5 MG tablet Take 1 tablet (5 mg total) by mouth every morning.  90 tablet  3  . fluticasone (FLONASE) 50 MCG/ACT nasal spray Place 2 sprays into both nostrils daily.  48 g  3  . lactulose (CHRONULAC) 10 GM/15ML solution Take 15 mLs by mouth 2 (two) times daily as needed (bowel movement).      . Linaclotide (LINZESS) 145 MCG CAPS capsule Take 1 capsule (145 mcg total) by mouth daily.  30 capsule  2  . omeprazole (PRILOSEC) 20 MG capsule Take 1 capsule (20 mg total) by mouth every morning.  90 capsule  3  . polyethylene glycol (MIRALAX  / GLYCOLAX) packet Take 17 g by mouth every morning.       . simvastatin (ZOCOR) 20 MG tablet Take 1 tablet (20 mg total) by mouth every morning.  90 tablet  3  . tamsulosin (FLOMAX) 0.4 MG CAPS capsule Take 1 capsule (0.4 mg total) by mouth every evening.  90 capsule  3   No current facility-administered medications on file prior to visit.   Allergies  Allergen Reactions  . Codeine Other (See Comments)    REACTION: hallucinations,faint,dizziness   Family History  Problem Relation Age of Onset  . Colon cancer Paternal Grandfather 25  . Cancer Paternal Grandfather     prostate  . Cancer Father     prostate  . Cancer Brother     prostate   PE: BP 128/72  Pulse 70  Temp(Src) 97.6 F (36.4 C) (Oral)  Resp 12  Ht 5\' 6"  (1.676 m)  Wt 176 lb (79.833 kg)  BMI 28.42 kg/m2  SpO2 96% Wt Readings from Last 3 Encounters:  03/30/14 176 lb (79.833 kg)  03/02/14 170 lb 9.6 oz (77.384 kg)  06/04/13 180 lb 6.4 oz (81.829 kg)   Constitutional: slightly overweight, in NAD Eyes: PERRLA, EOMI, no exophthalmos, no lid lag, no stare ENT: moist mucous membranes, lumpy-bumpy thyroid, no thyromegaly, no cervical lymphadenopathy Cardiovascular: RRR, No MRG Respiratory: CTA B Gastrointestinal: abdomen soft, NT, ND, BS+ Musculoskeletal: no deformities, strength intact in all 4 Skin: moist, warm, no rashes Neurological: mild tremor with outstretched hands (not new), DTR normal in all 4  ASSESSMENT: 1. Subclinical hyperthyroidism  PLAN:  1. Patient with a recently found low TSH x2, without thyrotoxic sxs: weight loss, heat intolerance, hyperdefecation, palpitations, anxiety.  - he does not appear to have exogenous causes for the low TSH.  - We discussed that possible causes of thyrotoxicosis are:  Graves ds   Thyroiditis toxic multinodular goiter/ toxic adenoma (has a lumpy-bumpy thyroid on palpation). - we will obtain an uptake and scan to differentiate between the 3 above possible  etiologies  - I suggested that we check the TSH, fT3 and fT4 in 2 more months  - we discussed about possible modalities of treatment for the above conditions, to include methimazole use, radioactive iodine ablation or surgery - I do not feel that we need to add beta blockers at this time, since he is not tachycardic, anxious, or tremulous - RTC in 4 months, but likely sooner for repeat labs  Uptake and scan pending. I will addend the results when they become available.  04/20/2014: CLINICAL DATA:  hyperthyroidism.  TSH equal 0.03  EXAM: THYROID SCAN AND UPTAKE - 24 HOURS  TECHNIQUE: Following the per oral administration of I-131 sodium iodide, the patient returned at 24 hours and uptake  measurements were acquired with the uptake probe centered on the neck. Thyroid imaging was performed following the intravenous administration of the Tc-22m Pertechnetate.  RADIOPHARMACEUTICALS:  14.9 microCuries I-131 Sodium Iodide and 11.0 mCi TC-73m Pertechnetate  FINDINGS: There is uniform uptake within thyroid gland which appears normal volume. There is warm warm nodule within the inferior pole of the right lobe of thyroid gland. There may be mild suppression of the remaining gland gland.  24 hour I 131 uptake = 22.2% (normal 10-30%)       IMPRESSION:  CHART UPTAKE 1. Warm nodule in the right lobe of thyroid gland. 2.  24 hour I 131 uptake = 22.2% (normal 10-30%)   Electronically Signed   By: Suzy Bouchard M.D.   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

## 2014-03-30 NOTE — Patient Instructions (Signed)
Please return to the lab in 2 months. We will schedule your thyroid uptake and scan and will let you know about the schedule.  Please come back for a follow-up appointment in 4 months.

## 2014-03-31 ENCOUNTER — Encounter: Payer: Self-pay | Admitting: Internal Medicine

## 2014-04-19 ENCOUNTER — Encounter (HOSPITAL_COMMUNITY)
Admission: RE | Admit: 2014-04-19 | Discharge: 2014-04-19 | Disposition: A | Discharge status: Home / Self Care | Payer: Medicare HMO | Source: Ambulatory Visit | Attending: Internal Medicine | Admitting: Internal Medicine

## 2014-04-19 DIAGNOSIS — E0590 Thyrotoxicosis, unspecified without thyrotoxic crisis or storm: Secondary | ICD-10-CM | POA: Insufficient documentation

## 2014-04-20 ENCOUNTER — Encounter (HOSPITAL_COMMUNITY)
Admission: RE | Admit: 2014-04-20 | Discharge: 2014-04-20 | Disposition: A | Discharge status: Home / Self Care | Payer: Medicare HMO | Source: Ambulatory Visit | Attending: Internal Medicine | Admitting: Internal Medicine

## 2014-04-20 ENCOUNTER — Encounter (HOSPITAL_COMMUNITY): Payer: Self-pay

## 2014-04-20 DIAGNOSIS — E0590 Thyrotoxicosis, unspecified without thyrotoxic crisis or storm: Secondary | ICD-10-CM | POA: Diagnosis not present

## 2014-04-20 MED ORDER — SODIUM IODIDE I 131 CAPSULE
14.9000 | Freq: Once | INTRAVENOUS | Status: AC | PRN
  Administered 2014-04-20: 14.9 via ORAL

## 2014-04-20 MED ORDER — SODIUM PERTECHNETATE TC 99M INJECTION
11.0000 | Freq: Once | INTRAVENOUS | Status: AC | PRN
  Administered 2014-04-20: 11 via INTRAVENOUS

## 2014-04-23 ENCOUNTER — Telehealth: Payer: Self-pay | Admitting: *Deleted

## 2014-04-23 ENCOUNTER — Telehealth: Payer: Self-pay | Admitting: Internal Medicine

## 2014-04-23 NOTE — Telephone Encounter (Signed)
Called pt and advised him per Dr Cruzita Lederer, ok to have the vacc. Pt understood .

## 2014-04-23 NOTE — Telephone Encounter (Signed)
Pt had radioactive treatment on Tuesday can he go ahead and get his flu shot

## 2014-04-23 NOTE — Telephone Encounter (Signed)
Pt had radioactive treatment on Tuesday can he go ahead and get his flu shot. Pt states he is going out of town and needs to know. Please advise.

## 2014-04-23 NOTE — Telephone Encounter (Signed)
Ok to go ahead with the flu shot

## 2014-04-23 NOTE — Telephone Encounter (Signed)
Please read note below

## 2014-05-14 ENCOUNTER — Other Ambulatory Visit (INDEPENDENT_AMBULATORY_CARE_PROVIDER_SITE_OTHER): Payer: Medicare HMO

## 2014-05-14 ENCOUNTER — Telehealth: Payer: Self-pay | Admitting: Internal Medicine

## 2014-05-14 DIAGNOSIS — E0590 Thyrotoxicosis, unspecified without thyrotoxic crisis or storm: Secondary | ICD-10-CM

## 2014-05-14 LAB — T4, FREE: Free T4: 1.21 ng/dL (ref 0.60–1.60)

## 2014-05-14 LAB — TSH: TSH: 0.04 u[IU]/mL — ABNORMAL LOW (ref 0.35–4.50)

## 2014-05-14 LAB — T3, FREE: T3 FREE: 3.3 pg/mL (ref 2.3–4.2)

## 2014-05-14 NOTE — Telephone Encounter (Signed)
Patient stated that he had a MRI in October but never got the results, could someone call him with the results.  Thank you

## 2014-05-14 NOTE — Telephone Encounter (Signed)
Called pt and advised him per Dr Arman Filter MyChart message to pt. Pt stated he never got the message. Pt was confused by the wording on lab work scheduled for "11/29" ... Pt to come today for lab work. Be advised.

## 2014-05-24 ENCOUNTER — Other Ambulatory Visit: Payer: Self-pay | Admitting: Internal Medicine

## 2014-05-24 DIAGNOSIS — E0590 Thyrotoxicosis, unspecified without thyrotoxic crisis or storm: Secondary | ICD-10-CM

## 2014-05-24 NOTE — Telephone Encounter (Signed)
OK, I ordered the RAI tx for his hyperthyroidism >> can we schedule this? I would like to see him back ~1-1.5 mo after the tx.

## 2014-05-24 NOTE — Telephone Encounter (Signed)
Called pt and explained to him what the treatment is. Pt understood and said if this is what needs to be done, then ok to move forward with this. Please advise next step.

## 2014-05-24 NOTE — Telephone Encounter (Signed)
Patient stated that he got a call stated that he needed to have a treatment, please advise on what treatment is needed

## 2014-05-26 ENCOUNTER — Telehealth: Payer: Self-pay | Admitting: *Deleted

## 2014-05-26 NOTE — Telephone Encounter (Signed)
Called pt and advised him of his upcoming RAI tx: Dec. 1st at 11:00 am at [facility]. Pt understood. Be advised.

## 2014-05-26 NOTE — Telephone Encounter (Signed)
Scheduled pt's RAI tx at [facility]; Tues, Dec 1st at 11:00 am (arrival time). Pt needs to be away from others for 2-4 days. Called pt and lvm for pt to return call. Be advised.

## 2014-05-28 ENCOUNTER — Encounter: Payer: Self-pay | Admitting: Internal Medicine

## 2014-06-01 ENCOUNTER — Encounter (HOSPITAL_COMMUNITY): Payer: Self-pay

## 2014-06-01 ENCOUNTER — Encounter (HOSPITAL_COMMUNITY)
Admission: RE | Admit: 2014-06-01 | Discharge: 2014-06-01 | Disposition: A | Discharge status: Home / Self Care | Payer: Medicare HMO | Source: Ambulatory Visit | Attending: Internal Medicine | Admitting: Internal Medicine

## 2014-06-01 DIAGNOSIS — E0590 Thyrotoxicosis, unspecified without thyrotoxic crisis or storm: Secondary | ICD-10-CM | POA: Insufficient documentation

## 2014-06-01 MED ORDER — SODIUM IODIDE I 131 CAPSULE
35.4000 | Freq: Once | INTRAVENOUS | Status: AC | PRN
  Administered 2014-06-01: 35.4 via ORAL

## 2014-06-02 ENCOUNTER — Telehealth: Payer: Self-pay | Admitting: Pulmonary Disease

## 2014-06-02 MED ORDER — OMEPRAZOLE 20 MG PO CPDR: 20.0000 mg | DELAYED_RELEASE_CAPSULE | Freq: Every morning | ORAL | Status: DC

## 2014-06-02 NOTE — Telephone Encounter (Signed)
Called spoke with pt. Aware RX sent in. Nothing further needed

## 2014-06-03 ENCOUNTER — Other Ambulatory Visit: Payer: Self-pay | Admitting: *Deleted

## 2014-06-03 DIAGNOSIS — E0590 Thyrotoxicosis, unspecified without thyrotoxic crisis or storm: Secondary | ICD-10-CM

## 2014-07-13 ENCOUNTER — Other Ambulatory Visit: Payer: Self-pay | Admitting: Pulmonary Disease

## 2014-07-28 ENCOUNTER — Encounter: Payer: Self-pay | Admitting: Internal Medicine

## 2014-07-28 ENCOUNTER — Ambulatory Visit (INDEPENDENT_AMBULATORY_CARE_PROVIDER_SITE_OTHER): Payer: Medicare HMO | Admitting: Internal Medicine

## 2014-07-28 VITALS — BP 122/88 | HR 97 | Temp 97.8°F | Resp 12 | Wt 174.0 lb

## 2014-07-28 DIAGNOSIS — E0510 Thyrotoxicosis with toxic single thyroid nodule without thyrotoxic crisis or storm: Secondary | ICD-10-CM | POA: Insufficient documentation

## 2014-07-28 DIAGNOSIS — E890 Postprocedural hypothyroidism: Secondary | ICD-10-CM

## 2014-07-28 DIAGNOSIS — E0590 Thyrotoxicosis, unspecified without thyrotoxic crisis or storm: Secondary | ICD-10-CM

## 2014-07-28 LAB — T4, FREE: FREE T4: 0.34 ng/dL — AB (ref 0.60–1.60)

## 2014-07-28 LAB — TSH: TSH: 9.15 u[IU]/mL — ABNORMAL HIGH (ref 0.35–4.50)

## 2014-07-28 LAB — T3, FREE: T3, Free: 2.4 pg/mL (ref 2.3–4.2)

## 2014-07-28 MED ORDER — LEVOTHYROXINE SODIUM 25 MCG PO TABS: 25.0000 ug | ORAL_TABLET | Freq: Every day | ORAL | Status: DC

## 2014-07-28 NOTE — Patient Instructions (Signed)
Please stop at the lab. Please come back for a follow-up appointment in 4 months.  If we need to start Levothyroxine, Take the thyroid hormone every day, with water, >30 minutes before breakfast, separated by >4 hours from acid reflux medications, calcium, iron, multivitamins.

## 2014-07-28 NOTE — Progress Notes (Signed)
Patient ID: Alan [name], male   DOB: December 29, 1940, 74 y.o.   MRN: [id number]   HPI  Alan [name] is a 74 y.o.-year-old male, initially referred by his PCP, Dr. Lenna Gilford, in consultation for R thyroid toxic adenoma. Last visit 3 mo ago.  He was found to have a low TSH (normal fT4) during regular labs.  I reviewed pt's thyroid tests: Lab Results  Component Value Date   TSH 0.04* 05/14/2014   TSH 0.03* 03/19/2014   TSH 0.02* 03/03/2014   TSH 0.93 04/24/2012   TSH 0.34* 04/03/2012   TSH 0.81 08/31/2010   TSH 0.65 01/09/2010   TSH 0.91 10/19/2008   TSH 1.16 09/18/2007   TSH 1.27 09/18/2006   FREET4 1.21 05/14/2014   FREET4 1.09 03/19/2014   FREET4 0.90 04/24/2012    An uptake and scan (04/20/2014) showed: 1. Warm nodule in the right lobe of thyroid gland. 2.  24 hour I 131 uptake = 22.2% (normal 10-30%)  He had RAI tx 06/01/2014.  Pt denies feeling nodules in neck, hoarseness, dysphagia/odynophagia, SOB with lying down; he c/o: - + fatigue - no excessive sweating/heat intolerance - no tremors - no anxiety - no palpitations - no hyperdefecation - no weight loss - he noticed more intense RLS lately (has been on med for this, stopped 2 years ago) - he noticed L ear hypoacusis  I reviewed his chart and he also has recently been dx with prostate cancer.  ROS: Constitutional: no weight gain/loss, + fatigue, no subjective hyperthermia/hypothermia Eyes: no blurry vision, no xerophthalmia ENT: no sore throat, no nodules palpated in throat, no dysphagia/odynophagia, no hoarseness, + decreased hearing Cardiovascular: no CP/SOB/palpitations/leg swelling Respiratory: no cough/SOB Gastrointestinal: no N/V/D/+ C Musculoskeletal: no muscle/no joint aches Skin: no rashes Neurological: no tremors/numbness/tingling/dizziness Psychiatric: no depression/anxiety + low libido, + diff with erections  I reviewed pt's medications, allergies, PMH, social hx, family hx, and changes were  documented in the history of present illness. Otherwise, unchanged from my initial visit note: Past Medical History  Diagnosis Date  . Sleep apnea     does not use cpap  . Unspecified essential hypertension   . Hypercholesteremia   . GERD (gastroesophageal reflux disease)   . Diverticulosis of colon (without mention of hemorrhage)   . Irritable bowel syndrome   . Benign neoplasm of colon     colon polyps  . Benign hypertrophy of prostate   . Erectile dysfunction   . Restless legs syndrome (RLS)   . Anxiety state, unspecified   . Onychomycosis   . Constipation, chronic    Past Surgical History  Procedure Laterality Date  . None    . Colonoscopy    . Laparoscopic appendectomy N/A 01/27/2013    Procedure: APPENDECTOMY LAPAROSCOPIC;  Surgeon: Edward Jolly, MD;  Location: [facility];  Service: General;  Laterality: N/A;  . Appendectomy  12/2012   History   Social History  . Marital Status: Married    Spouse Name: N/A    Children: yes   Occupational History  . Retired, Company secretary    Social History Main Topics  . Smoking status: Former Smoker -- 2.00 packs/day for 15 years    Types: Cigarettes    Quit date: 07/02/1968  . Smokeless tobacco: Never Used  . Alcohol Use: No  . Drug Use: No   Current Outpatient Prescriptions on File Prior to Visit  Medication Sig Dispense Refill  . amLODipine (NORVASC) 5 MG tablet Take 1 tablet (5 mg total) by  mouth every morning. 90 tablet 3  . aspirin EC 81 MG tablet Take 81 mg by mouth every morning.     . brimonidine-timolol (COMBIGAN) 0.2-0.5 % ophthalmic solution Place 1 drop into both eyes every 12 (twelve) hours.    . finasteride ([name]) 5 MG tablet Take 1 tablet (5 mg total) by mouth every morning. 90 tablet 3  . fluticasone (FLONASE) 50 MCG/ACT nasal spray Place 2 sprays into both nostrils daily. 48 g 3  . lactulose (CHRONULAC) 10 GM/15ML solution Take 15 mLs by mouth 2 (two) times daily as needed (bowel movement).    . Linaclotide  (LINZESS) 145 MCG CAPS capsule Take 1 capsule (145 mcg total) by mouth daily. 30 capsule 2  . omeprazole (PRILOSEC) 20 MG capsule Take 1 capsule (20 mg total) by mouth every morning. 90 capsule 3  . polyethylene glycol (MIRALAX / GLYCOLAX) packet Take 17 g by mouth every morning.     . simvastatin (ZOCOR) 20 MG tablet Take 1 tablet (20 mg total) by mouth every morning. 90 tablet 3  . simvastatin (ZOCOR) 20 MG tablet TAKE ONE TABLET BY MOUTH AT BEDTIME 90 tablet 0  . tamsulosin (FLOMAX) 0.4 MG CAPS capsule Take 1 capsule (0.4 mg total) by mouth every evening. 90 capsule 3   No current facility-administered medications on file prior to visit.   Allergies  Allergen Reactions  . Codeine Other (See Comments)    REACTION: hallucinations,faint,dizziness   Family History  Problem Relation Age of Onset  . Colon cancer Paternal Grandfather 71  . Cancer Paternal Grandfather     prostate  . Cancer Father     prostate  . Cancer Brother     prostate   PE: BP 122/88 mmHg  Pulse 97  Temp(Src) 97.8 F (36.6 C) (Oral)  Resp 12  Wt 174 lb (78.926 kg)  SpO2 94% Wt Readings from Last 3 Encounters:  07/28/14 174 lb (78.926 kg)  03/30/14 176 lb (79.833 kg)  03/02/14 170 lb 9.6 oz (77.384 kg)   Constitutional: slightly overweight, in NAD Eyes: PERRLA, EOMI, no exophthalmos, no lid lag, no stare ENT: moist mucous membranes, lumpy-bumpy thyroid, no thyromegaly, no cervical lymphadenopathy Cardiovascular: RRR, No MRG Respiratory: CTA B Gastrointestinal: abdomen soft, NT, ND, BS+ Musculoskeletal: no deformities, strength intact in all 4 Skin: moist, warm, no rashes Neurological: mild tremor with outstretched hands (not new), DTR normal in all 4  ASSESSMENT: 1. 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 Patient with previous subclinical hyperthyroidism caused by a R toxic adenoma, now s/p ablation - I explained that the RAI tx is the definitive tx for the adenoma, but we  need to continue to check labs to see if her develops hypothyroidism. This is the exception, rather than the rule after RAI tx for adenoma, though. -will check the TSH, fT3 and fT4 today and if hypothyroid >> I explained that we ned to start LT4 >>advised him to take the thyroid hormone every day, with water, >30 minutes before breakfast, separated by >4 hours from acid reflux medications, calcium, iron, multivitamins. - RTC in 4 months, but likely sooner for repeat labs  Office Visit on 07/28/2014  Component Date Value Ref Range Status  . TSH 07/28/2014 9.15* 0.35 - 4.50 uIU/mL Final  . T3, Free 07/28/2014 2.4  2.3 - 4.2 pg/mL Final  . Free T4 07/28/2014 0.34* 0.60 - 1.60 ng/dL Final   Pt developed hypothyroidism >> start 25 mcg Levothyroxine daily >>  repeat the labs in 5 weeks.

## 2014-07-30 ENCOUNTER — Ambulatory Visit: Payer: Medicare HMO | Admitting: Internal Medicine

## 2014-08-11 ENCOUNTER — Telehealth: Payer: Self-pay | Admitting: Pulmonary Disease

## 2014-08-11 NOTE — Telephone Encounter (Signed)
Pt aware that I will send to SN for refill approval. I do not see Rx on his list and looks as though he sees an Internal MD now. Please advise. Thanks.

## 2014-08-12 NOTE — Telephone Encounter (Signed)
Per SN- Viagra 100mg , # 10 as directed   lmtcb for pt.

## 2014-08-13 MED ORDER — SILDENAFIL CITRATE 100 MG PO TABS: 100.0000 mg | ORAL_TABLET | ORAL | Status: DC

## 2014-08-13 NOTE — Telephone Encounter (Signed)
Rx has been sent in. Pt is aware. Nothing further was needed. 

## 2014-09-10 ENCOUNTER — Other Ambulatory Visit: Payer: Self-pay | Admitting: Pulmonary Disease

## 2014-10-12 ENCOUNTER — Telehealth: Payer: Self-pay | Admitting: Pulmonary Disease

## 2014-10-12 NOTE — Telephone Encounter (Signed)
Patient notified, scheduled for physical with SN on 10/20/14.  Patient advised to come fasting since this is for physical.  Nothing further needed.

## 2014-10-12 NOTE — Telephone Encounter (Signed)
Pt is wanting to set up his annual exam with SN.  SN - please advise if we can make this appointment. Thanks.

## 2014-10-20 ENCOUNTER — Encounter: Payer: Self-pay | Admitting: Pulmonary Disease

## 2014-10-20 ENCOUNTER — Ambulatory Visit (INDEPENDENT_AMBULATORY_CARE_PROVIDER_SITE_OTHER): Payer: Medicare HMO | Admitting: Pulmonary Disease

## 2014-10-20 ENCOUNTER — Encounter (INDEPENDENT_AMBULATORY_CARE_PROVIDER_SITE_OTHER): Payer: Self-pay

## 2014-10-20 VITALS — BP 134/70 | HR 87 | Temp 97.6°F | Wt 174.8 lb

## 2014-10-20 DIAGNOSIS — K589 Irritable bowel syndrome without diarrhea: Secondary | ICD-10-CM

## 2014-10-20 DIAGNOSIS — G2581 Restless legs syndrome: Secondary | ICD-10-CM

## 2014-10-20 DIAGNOSIS — N62 Hypertrophy of breast: Secondary | ICD-10-CM | POA: Diagnosis not present

## 2014-10-20 DIAGNOSIS — C61 Malignant neoplasm of prostate: Secondary | ICD-10-CM

## 2014-10-20 DIAGNOSIS — E7800 Pure hypercholesterolemia, unspecified: Secondary | ICD-10-CM

## 2014-10-20 DIAGNOSIS — I10 Essential (primary) hypertension: Secondary | ICD-10-CM

## 2014-10-20 DIAGNOSIS — K219 Gastro-esophageal reflux disease without esophagitis: Secondary | ICD-10-CM

## 2014-10-20 DIAGNOSIS — H9193 Unspecified hearing loss, bilateral: Secondary | ICD-10-CM | POA: Diagnosis not present

## 2014-10-20 DIAGNOSIS — K5730 Diverticulosis of large intestine without perforation or abscess without bleeding: Secondary | ICD-10-CM

## 2014-10-20 DIAGNOSIS — E0590 Thyrotoxicosis, unspecified without thyrotoxic crisis or storm: Secondary | ICD-10-CM

## 2014-10-20 DIAGNOSIS — F411 Generalized anxiety disorder: Secondary | ICD-10-CM

## 2014-10-20 DIAGNOSIS — N401 Enlarged prostate with lower urinary tract symptoms: Secondary | ICD-10-CM

## 2014-10-20 DIAGNOSIS — N138 Other obstructive and reflux uropathy: Secondary | ICD-10-CM

## 2014-10-20 DIAGNOSIS — H9190 Unspecified hearing loss, unspecified ear: Secondary | ICD-10-CM | POA: Insufficient documentation

## 2014-10-20 DIAGNOSIS — E0510 Thyrotoxicosis with toxic single thyroid nodule without thyrotoxic crisis or storm: Secondary | ICD-10-CM

## 2014-10-20 DIAGNOSIS — K5909 Other constipation: Secondary | ICD-10-CM

## 2014-10-20 MED ORDER — LINACLOTIDE 145 MCG PO CAPS: 145.0000 ug | ORAL_CAPSULE | Freq: Every day | ORAL | Status: DC

## 2014-10-20 NOTE — Progress Notes (Signed)
Subjective:     Patient ID: Alan [name], male   DOB: 01-Oct-1940, 74 y.o.   MRN: [id number]  HPI [name] here for a follow up visit... he has mult med problems as noted below...  ~  SEE PREV EPIC NOTES FOR OLDER DATA >>    LABS 10/13:  FLP- at goals on Simva20;  Chems- wnl x BS=108;  CBC- wnl;  TSH=0.34 & we will recheck w/ FreeT3 & FreeT4...  ADDENDUM>> f/u TFTs 10/13 showed normal TSH=0.93 & normal FreeT3 & FreeT4...   ~  June 04, 2013:  26moROV & RTracenhad a prob w/ recurrent RLQ pain and finally had a Laparoscopic appendectomy 8/14 by Alan [name]- now feeling 100% better he says and back to normal... We reviewed the following medical problems during today's office visit >>     HBP>  On ASA81, Norvasc5; BP= 120/84 & he denies CP, palpit, SOB, edema, etc...    CHOL>  On Simva20 + diet; FLP 10/13 showed TChol 146, TG 68, HDL 45, LDL 88; continue same med & diet...    GERD>  On Prilosec20; he had LapAppy 8/14 by Alan [name] & now back to baseline w/o abd pain, n/v, c/d, blood seen...    Divertics, IBS w/ constip, Colon Polyps>  Off prev Linzess, Chronulac, Miralax; had colonoscopy 2/14 w/ int hems, otherw wnl, & f/u rec in 7375yr..    BPH>  On [name]5 & Flomax0.4; followed by Alan [name] (he does pt's PSA) [facility]/ [facility]...    RLS>  Not on meds his symptoms are not that bad & he is managing well "I deal w/ it- walking helps"...    Anxiety>  Not on meds and states he is doing well overall... We reviewed prob list, meds, xrays and labs> see below for updates >> he had the 2014 flu vaccine 10/14... Refill of all meds for 90d supplies...   LABS from July-Aug2014 reviewed in [facility].   ~  March 02, 2014:  [name] & add-on appt requested by pt's wife for mult complaints (he says he's ok, no big deal)- pain in left side of neck & shoulder (he thinks it's from his air conditioning); indigestion (he thinks it's panic attacks);  SOB/DOE going up  stairs (he feels it's his meds- "the meds catch up w/ me"); he notes that he is very active- yard, garden, [employer] at [employer] discussed his options- doesn't want anxiolytic, he wants to stop one med at a time to see if the SOB improves- OK... We reviewed the following medical problems during today's office visit >>     HBP>  On ASA81, Norvasc5; BP= 138/70 & he denies CP, palpit, SOB, edema, etc...    CHOL>  On Simva20 + diet; FLP 9/15 showed TChol 135, TG 66, HDL 38, LDL 84; continue same med & diet...    GERD>  On Prilosec20; he had LapAppy 8/14 by Alan [name] & now back to baseline w/o abd pain, n/v, c/d, blood seen...    Divertics, IBS w/ constip, Colon Polyps>  Off prev Linzess, Chronulac, Miralax; had colonoscopy 2/14 w/ int hems, otherw wnl, & f/u rec in 75yr63yr    BPH>  On [name]5 & Flomax0.4; followed by Alan [name] (he does pt's PSA) w/ rec for TURP & [facility] he has declined surg; pt tells me he was seen 7/15 & they plan Bx of prostate soon (we don't have  notes).    RLS>  Not on meds his symptoms are not that bad & he is managing well "I deal w/ it- walking helps"...    Anxiety>  Not on meds and states he is doing well overall... We reviewed prob list, meds, xrays and labs> see below for updates >>   EKG 9/15 showed NSR, rate78, wnl...  LABS 9/15:  FLP- wnl on Simva20;  Chems- wnl;  CBC- wnl;  TSH=0.02... 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     Today [name] several concerns to discuss:  1) decreased hearing- no cerumen impactions on exam & we rec referral to ENT office for audiology testing;  2) left breast is sore/ swollen- exam shows gynecomastia &he is on [name], asked to stop this med & notify urology; he was also Dx w/ Prostate Ca by Alan [name] on watchful waiting;  3) constipation- prev on Linzess but he stopped & Miralax etc not working (needs enema or suppos to go), rec to restart the Linzess145/d & let us [place]...  We reviewed the following medical problems during today's office visit >>     HBP>  On ASA81, Norvasc5; BP= 134/70 & he denies CP, palpit, SOB, edema, etc...    CHOL>  On Simva20 + diet; FLP 9/15 showed TChol 135, TG 66, HDL 38, LDL 84; continue same med & diet...    Thyroid adenoma w/ hyperthyroidism> referred to Alan [name] w/ RAI uptake & scan w/ warm nodule right lower pole; s/p RAI rx 06/2014=> subseq started Synthroid25m59m & they continue to follow...    GERD>  On Prilosec20; he had LapAppy 8/14 by Alan [name] & now back to baseline w/o abd pain, n/v, c/d, blood seen...    Divertics, IBS w/ constip, Colon Polyps>  On Chronulac & Miralax but needs enema or suppos to go; had colonoscopy 2/14 w/ int hems, otherw wnl, & f/u rec in 66yrs66yrc to restart [id number] us knKorea response...    BPH, prostate ca>  On [name]5 & Flomax0.4 per Alan [name] w/ [facility]; rising PSA=> Bx 9/15 pos for sm vol prostate cancer, on watchful waiting protocol & Urology follow up; he developed left gynecomastia 4/16 & [name] stopped=> he will notify Urology...    RLS>  Not on meds his symptoms are not that bad & he is managing well "I deal w/ it- walking helps"...    Anxiety>  Not on meds and states he is doing well overall... We reviewed prob list, meds, xrays and labs> see below for updates >>  PLAN>> we  will refer to ENT for hearing eval; stop [name] due to gynecomastia & notify Alan [name]; start Linzess145 daily & he will let us kn[place] how it isworking so we can assess the dose...           Problem List:   OBSTRUCTIVE  SLEEP APNEA (ICD-327.23) - s/p sleep study 1/08 showing RDI=16, and desat to 89%... he had 4+leg jerks and given trial Requip (?helped)... states he rests well and wakes feeling refreshed most of the time "I live w/ it, it's not that bad"...  HYPERTENSION (ICD-401.9) - controlled on NORVASC '5mg'$ /d, and prev on Cardura but this was switched by Alan [name]...  ~  10/13: BP=118/82, tol meds well... denies HA, fatigue, visual changes, CP, palipit, dizziness, syncope, dyspnea, edema, etc... he had a Cardiolite scan in 1997 that was neg/ normal w/ EF=75%... ~  CXR 4/14 showed norm heart size, atherosclerotic in ThorAo, clear lungs, NAD... ~  12/14: on ASA81, Norvasc5; BP= 120/84 & he denies CP, palpit, SOB, edema, etc ~  9/15: on ASA81, Norvasc5; BP= 138/70 & he denies CP, palpit, SOB, edema, etc ~  4/16: on ASA81, Norvasc5; BP= 134/70 & he remains asymptomatic...  HYPERCHOLESTEROLEMIA (ICD-272.0) - on SIMVASTATIN '20mg'$ /d + diet...  ~  [place] 3/08 showed TChol 157, TG 53, HDL 53, LDL 94 ~  FLP 3/09 showed TChol 124, TG 33, HDL 44, LDL 74 ~  FLP 4/10 showed TChol 123, TG 36, HDL 41, LDL 75 ~  FLP 3/12 showed TChol 148, TG 47, HDL 49, LDL 89... continue Simva20. ~  [place] 10/13 on Simva20 showed TChol 146, TG 68, HDL 45, LDL 88... Continue same. ~  FLP 9/15 on Simva20 showed TChol 135, TG 66, HDL 38, LDL 84  SUBCLINICAL HYPERTHYROIDISM >> see notes above...  ~  Labs 9/15 showed TSH=0.02 and repeat labs showed TSH=0.03, FreeT3=3.5 (2.3-4.2), FreeT4=1.09 (0.6-1.6) => we will refer to endocrine for further eval... ~  Seen by Alan [name] w/ RAI uptake & scan showing a warm nodule right lower pole; treated 06/2014 w/ RAI & started on Synthroid 07/2014, currently 71mg/d & they plan f/u  soon...  GERD (ICD-530.81) - prev rec to take Prevacid '30mg'$  & Zantac 150Qhs... GI eval 1/11 by DDemetra Shinerw/ rec to take PRILOSEC daily ~  EGD 5/02 showing esophagitis and gastritis... ~  EGD 1/11 showed GE junction erosions & duodenitis... Rx Prilosec & improved symptomatically.  DIVERTICULOSIS OF COLON (ICD-562.10) - on Metamucil daily...  IRRITABLE BOWEL SYNDROME w/ CONSTIPATION > on LINZESS '145mg'$ /d per GI... COLONIC POLYPS (ICD-211.3) ~  colonoscopy 5/07 by DrSam showing divertics and 3-474madenomatous polyps removed... f/u planned 3y84yr. ~  colonoscopy 1/11 w/ several pedunculated polyps & path was neg- benign colonic mucosa w/o adenomatous change. ~  Colonoscopy 2/14 by [name] showed int hems, otherw neg, and f/u rec in 7 yrs...  ~  5/14: initial hosp for RLQ pain & dx w/ appendicitis- treated non-operatively ~  CT Abd & Pelvis 7/14 showed suspected acute on chr perforated appendicitis w/ sm periappendiceal fluid collection, no free air; enlarged prostate, mild degen changes in Tspine, sm bilat renal cysts...   BENIGN PROSTATIC HYPERTROPHY, HX OF (ICD-V13.8) - seen by DrHumphries previously... now seeing Alan [name] but pt doesn't feel that he is doing as well on the [facility] /[facility] (prev on [name] + Cardura)... I've encouraged him to discuss this w/ Alan [name]... 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 ~  5/14:  He was seen by Alan [name] during his 5/14 hosp for appendicitis; CT showed prostatic enlargement, BPH, BOO, & he was rec to f/u as outpt for poss cysto... ~  9/15:  He tells me he saw Alan [name] 7/15 w/ elev PSA & they plan prostate bx soon (we don't have recent notes from them)...  ERECTILE DYSFUNCTION (ICD-302.72) - Tried all 3 PDE4 inhibitors, but not much benefit... consider  shots.  ~  labs in 09/26/08 at Urology office was 1.47 per patient. ~  Sep 27, 1999: he tells me he had his PSA checked at [place] it was "OK"...  Hx POS RPR and T-PALLIDUM Antibodies >> labs confirmed pos in Sep 26, 2009 and he went to [place] at health dept for 2nd opinion; labs confirmed & DrRobinson wanted him to get Neuro eval w/ LP; seen by DrYan for [facility]- CSF studies to T-Pallidum were neg; treated w/ series of 3 Benzathine PenG injections...  ~  MRI Brain 8/11 showed a few nonspecific areas of bifrontal gliosis and a partially empty sella...   RESTLESS LEG SYNDROME (ICD-333.94) - prev on Requip '1mg'$ Qhs, but stopped this med due to stomach side effects ... he notes no diff w/ resting- good nights, and bad nights...   ANXIETY (ICD-300.00) - he does not desire anxiolytic Rx...  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   Past Medical History  Diagnosis Date  . Sleep apnea     does not use cpap  . Unspecified essential hypertension   . Hypercholesteremia   . GERD (gastroesophageal reflux disease)   . Diverticulosis of colon (without mention of hemorrhage)   . Irritable bowel syndrome   . Benign neoplasm of colon     colon polyps  . Benign hypertrophy of prostate   . Erectile dysfunction   . Restless legs syndrome (RLS)   . Anxiety state, unspecified   . Onychomycosis   . Constipation, chronic     Past Surgical History  Procedure Laterality Date  . None    . Colonoscopy    . Laparoscopic appendectomy N/A 01/27/2013    Procedure: APPENDECTOMY LAPAROSCOPIC;  Surgeon: Edward Jolly, MD;  Location: [facility];  Service: General;  Laterality: N/A;  . Appendectomy  12/2012    Outpatient Encounter Prescriptions as of 10/20/2014  Medication Sig  . amLODipine (NORVASC) 5 MG tablet TAKE ONE TABLET BY MOUTH ONCE DAILY IN THE MORNING  . aspirin EC 81 MG tablet Take 81 mg by mouth every morning.   . brimonidine-timolol (COMBIGAN) 0.2-0.5 % ophthalmic  solution Place 1 drop into both eyes every 12 (twelve) hours.  . finasteride ([name]) 5 MG tablet Take 1 tablet (5 mg total) by mouth every morning.  . fluticasone (FLONASE) 50 MCG/ACT nasal spray Place 2 sprays into both nostrils daily.  [name] lactulose (CHRONULAC) 10 GM/15ML solution Take 15 mLs by mouth 2 (two) times daily as needed (bowel movement).  [name] levothyroxine (SYNTHROID, LEVOTHROID) 25 MCG tablet Take 1 tablet (25 mcg total) by mouth daily before breakfast.  . Linaclotide (LINZESS) 145 MCG CAPS capsule Take 1 capsule (145 mcg total) by mouth daily.  [name] omeprazole (PRILOSEC) 20 MG capsule Take 1 capsule (20 mg total) by mouth every morning.  . polyethylene glycol (MIRALAX / GLYCOLAX) packet Take 17 g by mouth every morning.   . sildenafil (VIAGRA) 100 MG tablet Take 1 tablet (100 mg total) by mouth as directed.  . simvastatin (ZOCOR) 20 MG tablet Take 1 tablet (20 mg total) by mouth every morning.  . simvastatin (ZOCOR) 20 MG tablet TAKE ONE TABLET BY MOUTH AT BEDTIME  . tamsulosin (FLOMAX) 0.4 MG CAPS capsule Take 1 capsule (0.4 mg  total) by mouth every evening.    Allergies  Allergen Reactions  . Codeine Other (See Comments)    REACTION: hallucinations,faint,dizziness    Current Medications, Allergies, Past Medical History, Past Surgical History, Family History, and Social History were reviewed in [employer] record.   Review of Systems        See HPI - all other systems neg except as noted... The patient denies anorexia, fever, weight loss, weight gain, vision loss, decreased hearing, hoarseness, chest pain, syncope, dyspnea on exertion, peripheral edema, prolonged cough, headaches, hemoptysis, abdominal pain, melena, hematochezia, severe indigestion/heartburn, hematuria, incontinence, muscle weakness, suspicious skin lesions, transient blindness, difficulty walking, depression, unusual weight change, abnormal bleeding, enlarged lymph nodes, and angioedema.      Objective:   Physical Exam    WD, WN, 74 y/o BM in NAD... GENERAL:  Alert & oriented; pleasant & cooperative... HEENT:  [place]/AT, EOM-wnl, PERRLA, EACs-clear, TMs-wnl, NOSE-clear, THROAT-clear & wnl. NECK:  Supple w/ full ROM; no JVD; normal carotid impulses w/o bruits; no thyromegaly or nodules palpated; no lymphadenopathy. CHEST:  Clear to P & A; without wheezes/ rales/ or rhonchi. HEART:  Regular Rhythm; without murmurs/ rubs/ or gallops. ABDOMEN:  Soft & nontender; normal bowel sounds; no organomegaly or masses detected. EXT: without deformities, mild arthritic changes; no varicose veins/ venous insuffic/ or edema. NEURO:  CN's intact; motor testing normal; sensory testing normal; gait normal & balance OK. DERM:  No lesions noted; no rash etc...  RADIOLOGY DATA:  Reviewed in the EPIC EMR & discussed w/ the patient...  LABORATORY DATA:  Reviewed in the EPIC EMR & discussed w/ the patient...   Assessment:      HBP>  Well controlled on Amlod5; continue same + diet & low sodium...  CHOL>  Well regulated on Simva20; continue same...  Hyperthyroid, toxic adenoma, s/p RAI>  Labs 9/15 w/ suppressed TSH- referred to Endocrine w/ eval=> RAI therapy 06/2014 & Synthroid started 07/2014...  GERD>  Stable on Prilosec20; continue same Rx...  Divertics, IBS, Colon Polyps>  He notes improvement in his chr constipation on Linzess 145...  S/P Appendectomy 8/14 by Alan [name]...  BPH>  Followed by Alan [name] on Flomax & [name] (stopped 4/16 due to gynecomastia); he has declined TURP surg, they do his PSAs which were rising=> Bx pos 03/2014 & on watchful waiting...  RLS>  States it's not that bad & not requiring meds...  Anxiety>  Similarly, he notes not that bad & not on meds...     Plan:     Patient's Medications  New Prescriptions   LINACLOTIDE (LINZESS) 145 MCG CAPS CAPSULE    Take 1 capsule (145 mcg total) by mouth daily.  Previous Medications   AMLODIPINE (NORVASC) 5 MG TABLET     TAKE ONE TABLET BY MOUTH ONCE DAILY IN THE MORNING   ASPIRIN EC 81 MG TABLET    Take 81 mg by mouth every morning.    BRIMONIDINE-TIMOLOL (COMBIGAN) 0.2-0.5 % OPHTHALMIC SOLUTION    Place 1 drop into both eyes every 12 (twelve) hours.   FLUTICASONE (FLONASE) 50 MCG/ACT NASAL SPRAY    Place 2 sprays into both nostrils daily.   LACTULOSE (CHRONULAC) 10 GM/15ML SOLUTION    Take 15 mLs by mouth 2 (two) times daily as needed (bowel movement).   LEVOTHYROXINE (SYNTHROID, LEVOTHROID) 25 MCG TABLET    Take 1 tablet (25 mcg total) by mouth daily before breakfast.   LINACLOTIDE (LINZESS) 145 MCG CAPS CAPSULE    Take 1 capsule (145  mcg total) by mouth daily.   OMEPRAZOLE (PRILOSEC) 20 MG CAPSULE    Take 1 capsule (20 mg total) by mouth every morning.   POLYETHYLENE GLYCOL (MIRALAX / GLYCOLAX) PACKET    Take 17 g by mouth every morning.    SILDENAFIL (VIAGRA) 100 MG TABLET    Take 1 tablet (100 mg total) by mouth as directed.   SIMVASTATIN (ZOCOR) 20 MG TABLET    Take 1 tablet (20 mg total) by mouth every morning.   SIMVASTATIN (ZOCOR) 20 MG TABLET    TAKE ONE TABLET BY MOUTH AT BEDTIME   TAMSULOSIN (FLOMAX) 0.4 MG CAPS CAPSULE    Take 1 capsule (0.4 mg total) by mouth every evening.  Modified Medications   No medications on file  Discontinued Medications   FINASTERIDE ([name]) 5 MG TABLET    Take 1 tablet (5 mg total) by mouth every morning.

## 2014-10-20 NOTE — Patient Instructions (Signed)
Today we updated your med list in our EPIC system...     We decided to STOP your [name] due to the gynecomastia (be sure to tell [name])...  We wrote a new prescription for LINZESS '145mg'$  take one tab daily for your constipation & let me know how you are doing so we can adjust the dose if necessary...  We will arrange for a hearing eval w/ an ENT specialists office...  Call for any questions...  Let's plan a follow up visit in 34mo sooner if needed for problems..Marland KitchenMarland Kitchen

## 2014-11-15 ENCOUNTER — Ambulatory Visit (INDEPENDENT_AMBULATORY_CARE_PROVIDER_SITE_OTHER): Payer: Medicare HMO | Admitting: Internal Medicine

## 2014-11-15 ENCOUNTER — Encounter: Payer: Self-pay | Admitting: Gastroenterology

## 2014-11-15 ENCOUNTER — Encounter: Payer: Self-pay | Admitting: Internal Medicine

## 2014-11-15 VITALS — BP 120/74 | HR 76 | Temp 97.5°F | Resp 12 | Wt 177.0 lb

## 2014-11-15 DIAGNOSIS — E890 Postprocedural hypothyroidism: Secondary | ICD-10-CM

## 2014-11-15 DIAGNOSIS — E0510 Thyrotoxicosis with toxic single thyroid nodule without thyrotoxic crisis or storm: Secondary | ICD-10-CM | POA: Diagnosis not present

## 2014-11-15 DIAGNOSIS — E0590 Thyrotoxicosis, unspecified without thyrotoxic crisis or storm: Secondary | ICD-10-CM | POA: Diagnosis not present

## 2014-11-15 LAB — TSH: TSH: 56.56 u[IU]/mL — ABNORMAL HIGH (ref 0.35–4.50)

## 2014-11-15 LAB — T4, FREE: FREE T4: 0.3 ng/dL — AB (ref 0.60–1.60)

## 2014-11-15 MED ORDER — LEVOTHYROXINE SODIUM 25 MCG PO TABS: 50.0000 ug | ORAL_TABLET | Freq: Every day | ORAL | Status: DC

## 2014-11-15 NOTE — Progress Notes (Signed)
Patient ID: Alan [name], male   DOB: Nov 22, 1940, 74 y.o.   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 Last visit 4.5 mo ago.  He was found to have a low TSH (normal fT4) during regular labs. An uptake and scan (04/20/2014) showed: 1. Warm nodule in the right lobe of thyroid gland. 2.  24 hour I 131 uptake = 22.2% (normal 10-30%) He had RAI tx 06/01/2014.  He developed hypothyroidism after his radioactive iodine treatment. At last visit we started levothyroxine 25 mg daily. I did advise him to come back for labs in 5-6 weeks, however he did not return.  He is taking the levothyroxine: - Daily - With water - Separated by more than 30 minutes from breakfast - No calcium, iron, multivitamins - + PPIs at night  I reviewed pt's thyroid tests: Lab Results  Component Value Date   TSH 9.15* 07/28/2014   TSH 0.04* 05/14/2014   TSH 0.03* 03/19/2014   TSH 0.02* 03/03/2014   TSH 0.93 04/24/2012   TSH 0.34* 04/03/2012   TSH 0.81 08/31/2010   TSH 0.65 01/09/2010   TSH 0.91 10/19/2008   TSH 1.16 09/18/2007   FREET4 0.34* 07/28/2014   FREET4 1.21 05/14/2014   FREET4 1.09 03/19/2014   FREET4 0.90 04/24/2012    Pt denies feeling nodules in neck, hoarseness, dysphagia/odynophagia, SOB with lying down; he c/o: - + fatigue - no excessive sweating/heat intolerance - no tremors - no anxiety - no palpitations - no hyperdefecation - no weight loss  I reviewed his chart and he also has recently been dx with prostate cancer.   ROS: Constitutional: no weight gain/loss, + fatigue, no subjective hyperthermia/hypothermia Eyes: no blurry vision, no xerophthalmia ENT: no sore throat, no nodules palpated in throat, no dysphagia/odynophagia, no hoarseness, + decreased hearing Cardiovascular: no CP/SOB/palpitations/leg swelling Respiratory: no  cough/SOB Gastrointestinal: no N/V/D/C Musculoskeletal: no muscle/no joint aches Skin: no rashes Neurological: no tremors/numbness/tingling/dizziness Psychiatric: no depression/anxiety + enlarged breasts.   I reviewed pt's medications, allergies, PMH, social hx, family hx, and changes were documented in the history of present illness. Otherwise, unchanged from my initial visit note. Atenolol was increased to 50 mg daily. Past Medical History  Diagnosis Date  . Sleep apnea     does not use cpap  . Unspecified essential hypertension   . Hypercholesteremia   . GERD (gastroesophageal reflux disease)   . Diverticulosis of colon (without mention of hemorrhage)   . Irritable bowel syndrome   . Benign neoplasm of colon     colon polyps  . Benign hypertrophy of prostate   . Erectile dysfunction   . Restless legs syndrome (RLS)   . Anxiety state, unspecified   . Onychomycosis   . Constipation, chronic    Past Surgical History  Procedure Laterality Date  . None    . Colonoscopy    . Laparoscopic appendectomy N/A 01/27/2013    Procedure: APPENDECTOMY LAPAROSCOPIC;  Surgeon: Edward Jolly, MD;  Location: [facility];  Service: General;  Laterality: N/A;  . Appendectomy  12/2012   History   Social History  . Marital Status: Married    Spouse Name: N/A    Children: yes   Occupational History  . Retired, Company secretary    Social History Main Topics  . Smoking status: Former Smoker -- 2.00 packs/day for 15 years    Types: Cigarettes  Quit date: 07/02/1968  . Smokeless tobacco: Never Used  . Alcohol Use: No  . Drug Use: No   Current Outpatient Prescriptions on File Prior to Visit  Medication Sig Dispense Refill  . amLODipine (NORVASC) 5 MG tablet TAKE ONE TABLET BY MOUTH ONCE DAILY IN THE MORNING 90 tablet 0  . aspirin EC 81 MG tablet Take 81 mg by mouth every morning.     . brimonidine-timolol (COMBIGAN) 0.2-0.5 % ophthalmic solution Place 1 drop into both eyes every 12 (twelve)  hours.    . fluticasone (FLONASE) 50 MCG/ACT nasal spray Place 2 sprays into both nostrils daily. 48 g 3  . lactulose (CHRONULAC) 10 GM/15ML solution Take 15 mLs by mouth 2 (two) times daily as needed (bowel movement).    [name] levothyroxine (SYNTHROID, LEVOTHROID) 25 MCG tablet Take 1 tablet (25 mcg total) by mouth daily before breakfast. 60 tablet 1  . Linaclotide (LINZESS) 145 MCG CAPS capsule Take 1 capsule (145 mcg total) by mouth daily. 30 capsule 2  . Linaclotide (LINZESS) 145 MCG CAPS capsule Take 1 capsule (145 mcg total) by mouth daily. 30 capsule 3  . omeprazole (PRILOSEC) 20 MG capsule Take 1 capsule (20 mg total) by mouth every morning. 90 capsule 3  . polyethylene glycol (MIRALAX / GLYCOLAX) packet Take 17 g by mouth every morning.     . sildenafil (VIAGRA) 100 MG tablet Take 1 tablet (100 mg total) by mouth as directed. 10 tablet 0  . simvastatin (ZOCOR) 20 MG tablet Take 1 tablet (20 mg total) by mouth every morning. 90 tablet 3  . simvastatin (ZOCOR) 20 MG tablet TAKE ONE TABLET BY MOUTH AT BEDTIME 90 tablet 0  . tamsulosin (FLOMAX) 0.4 MG CAPS capsule Take 1 capsule (0.4 mg total) by mouth every evening. 90 capsule 3   No current facility-administered medications on file prior to visit.   Allergies  Allergen Reactions  . Codeine Other (See Comments)    REACTION: hallucinations,faint,dizziness   Family History  Problem Relation Age of Onset  . Colon cancer Paternal Grandfather 63  . Cancer Paternal Grandfather     prostate  . Cancer Father     prostate  . Cancer Brother     prostate   PE: BP 120/74 mmHg  Pulse 76  Temp(Src) 97.5 F (36.4 C) (Oral)  Resp 12  Wt 177 lb (80.287 kg)  SpO2 96% Body mass index is 28.58 kg/(m^2). Wt Readings from Last 3 Encounters:  11/15/14 177 lb (80.287 kg)  10/20/14 174 lb 12.8 oz (79.289 kg)  07/28/14 174 lb (78.926 kg)   Constitutional: slightly overweight, in NAD Eyes: PERRLA, EOMI, no exophthalmos, no lid lag, no stare ENT:  moist mucous membranes, lumpy-bumpy thyroid, no thyromegaly, no cervical lymphadenopathy Cardiovascular: RRR, No MRG Respiratory: CTA B Gastrointestinal: abdomen soft, NT, ND, BS+ Musculoskeletal: no deformities, strength intact in all 4 Skin: moist, warm, no rashes Neurological: mild tremor with outstretched hands (not new), DTR normal in all 4  ASSESSMENT: 1. 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 Post-ablative hypothyroidism  PLAN:  1.  and 2. Patient with previous subclinical hyperthyroidism caused by a R toxic adenoma, now s/p ablation - Patient with post ablative hypothyroidism, now on levothyroxine 25 g daily. He appears euthyroid. - He did not return for labs 5-6 weeks after starting his levothyroxine dose, despite advice to do so. Will check them today: TSH, free T4  - I advised him to take the thyroid hormone every day,  with water, >30 minutes before breakfast, separated by >4 hours from acid reflux medications, calcium, iron, multivitamins. He is taking it correctly. - RTC in 4 months, but likely sooner for repeat labs  Office Visit on 11/15/2014  Component Date Value Ref Range Status  . TSH 11/15/2014 56.56* 0.35 - 4.50 uIU/mL Final  . Free T4 11/15/2014 0.30* 0.60 - 1.60 ng/dL Final   Thyroid tests very abnormal - severe hypothyroidism. I will increase his levothyroxine to 50 g daily. He will probably need more than that, but larger increases are not advisable due to age. He will definitely need to return for repeat set of labs in 6 weeks.

## 2014-11-15 NOTE — Patient Instructions (Signed)
Please stop at the lab.  Continue Levothyroxine 25 mcg in am.  Take the thyroid hormone every day, with water, >30 minutes before breakfast, separated by >4 hours from acid reflux medications, calcium, iron, multivitamins.  Please come back for a follow-up appointment in 4 months.

## 2014-11-18 ENCOUNTER — Other Ambulatory Visit: Payer: Self-pay | Admitting: Pulmonary Disease

## 2014-11-23 ENCOUNTER — Telehealth: Payer: Self-pay | Admitting: Internal Medicine

## 2014-11-25 NOTE — Telephone Encounter (Signed)
Patient need a refill of Levothyroxine, could he get today, he is totally out.

## 2014-11-25 NOTE — Telephone Encounter (Signed)
Done

## 2014-12-17 ENCOUNTER — Other Ambulatory Visit: Payer: Self-pay | Admitting: Pulmonary Disease

## 2014-12-28 ENCOUNTER — Other Ambulatory Visit (INDEPENDENT_AMBULATORY_CARE_PROVIDER_SITE_OTHER): Payer: Medicare HMO

## 2014-12-28 DIAGNOSIS — E0590 Thyrotoxicosis, unspecified without thyrotoxic crisis or storm: Secondary | ICD-10-CM

## 2014-12-28 LAB — TSH: TSH: 36.89 u[IU]/mL — AB (ref 0.35–4.50)

## 2014-12-28 LAB — T4, FREE: Free T4: 0.56 ng/dL — ABNORMAL LOW (ref 0.60–1.60)

## 2014-12-28 LAB — T3, FREE: T3, Free: 2.5 pg/mL (ref 2.3–4.2)

## 2015-01-05 ENCOUNTER — Other Ambulatory Visit: Payer: Self-pay | Admitting: *Deleted

## 2015-01-05 MED ORDER — LEVOTHYROXINE SODIUM 75 MCG PO TABS: 75.0000 ug | ORAL_TABLET | Freq: Every day | ORAL | Status: DC

## 2015-02-22 ENCOUNTER — Other Ambulatory Visit: Payer: Self-pay | Admitting: Pulmonary Disease

## 2015-02-25 ENCOUNTER — Other Ambulatory Visit: Payer: Medicare HMO

## 2015-03-04 ENCOUNTER — Encounter (INDEPENDENT_AMBULATORY_CARE_PROVIDER_SITE_OTHER): Payer: Self-pay

## 2015-03-04 ENCOUNTER — Other Ambulatory Visit (INDEPENDENT_AMBULATORY_CARE_PROVIDER_SITE_OTHER): Payer: Medicare HMO

## 2015-03-04 DIAGNOSIS — E890 Postprocedural hypothyroidism: Secondary | ICD-10-CM

## 2015-03-04 LAB — TSH: TSH: 14.1 u[IU]/mL — ABNORMAL HIGH (ref 0.35–4.50)

## 2015-03-04 LAB — T4, FREE: FREE T4: 0.99 ng/dL (ref 0.60–1.60)

## 2015-03-08 ENCOUNTER — Other Ambulatory Visit: Payer: Self-pay | Admitting: *Deleted

## 2015-03-08 DIAGNOSIS — E890 Postprocedural hypothyroidism: Secondary | ICD-10-CM

## 2015-03-08 MED ORDER — LEVOTHYROXINE SODIUM 88 MCG PO TABS: 88.0000 ug | ORAL_TABLET | Freq: Every day | ORAL | Status: DC

## 2015-03-18 ENCOUNTER — Encounter: Payer: Self-pay | Admitting: Internal Medicine

## 2015-03-18 ENCOUNTER — Ambulatory Visit (INDEPENDENT_AMBULATORY_CARE_PROVIDER_SITE_OTHER): Payer: Medicare HMO | Admitting: Internal Medicine

## 2015-03-18 VITALS — BP 126/78 | HR 91 | Temp 98.1°F | Resp 12 | Wt 173.0 lb

## 2015-03-18 DIAGNOSIS — E0510 Thyrotoxicosis with toxic single thyroid nodule without thyrotoxic crisis or storm: Secondary | ICD-10-CM

## 2015-03-18 NOTE — Progress Notes (Signed)
Patient ID: Alan [name], male   DOB: [birth year], 74 y.o.   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 Last visit 4 mo ago.  Reviewed hx: He was found to have a low TSH (normal fT4) during regular labs. An uptake and scan (04/20/2014) showed: 1. Warm nodule in the right lobe of thyroid gland. 2.  24 hour I 131 uptake = 22.2% (normal 10-30%) He had RAI tx 06/01/2014.  He developed hypothyroidism after his radioactive iodine treatment. At last visit we started levothyroxine 25 mg daily >> now on 88 mcg daily (increased 03/04/2015).  He is taking the levothyroxine: - Daily - With water - Separated by more than 30 minutes from breakfast - No calcium, iron, multivitamins - + PPIs at night  I reviewed pt's thyroid tests: Lab Results  Component Value Date   TSH 14.10* 03/04/2015   TSH 36.89* 12/28/2014   TSH 56.56* 11/15/2014   TSH 9.15* 07/28/2014   TSH 0.04* 05/14/2014   TSH 0.03* 03/19/2014   TSH 0.02* 03/03/2014   TSH 0.93 04/24/2012   TSH 0.34* 04/03/2012   TSH 0.81 08/31/2010   FREET4 0.99 03/04/2015   FREET4 0.56* 12/28/2014   FREET4 0.30* 11/15/2014   FREET4 0.34* 07/28/2014   FREET4 1.21 05/14/2014   FREET4 1.09 03/19/2014   FREET4 0.90 04/24/2012    He was on Atenolol 50 mg daily, now off  Pt denies feeling nodules in neck, hoarseness, dysphagia/odynophagia, SOB with lying down; he c/o: - + fatigue - + weight loss (~2-3 lbs by scale at home) - no excessive sweating/heat intolerance - no tremors - no anxiety - no palpitations - no hyperdefecation  I reviewed his chart and he also has with prostate cancer - seen by Urology.   ROS: Constitutional: + weight loss, + fatigue, no subjective hyperthermia/hypothermia Eyes: no blurry vision, no xerophthalmia ENT: no sore throat, no nodules palpated in throat, no  dysphagia/odynophagia, no hoarseness, + decreased hearing Cardiovascular: no CP/SOB/palpitations/leg swelling Respiratory: no cough/SOB Gastrointestinal: no N/V/D/C Musculoskeletal: no muscle/no joint aches Skin: no rashes Neurological: no tremors/numbness/tingling/dizziness + enlarged breasts.   I reviewed pt's medications, allergies, PMH, social hx, family hx, and changes were documented in the history of present illness. Otherwise, unchanged from my initial visit note.  Past Medical History  Diagnosis Date  . Sleep apnea     does not use cpap  . Unspecified essential hypertension   . Hypercholesteremia   . GERD (gastroesophageal reflux disease)   . Diverticulosis of colon (without mention of hemorrhage)   . Irritable bowel syndrome   . Benign neoplasm of colon     colon polyps  . Benign hypertrophy of prostate   . Erectile dysfunction   . Restless legs syndrome (RLS)   . Anxiety state, unspecified   . Onychomycosis   . Constipation, chronic    Past Surgical History  Procedure Laterality Date  . None    . Colonoscopy    . Laparoscopic appendectomy N/A 01/27/2013    Procedure: APPENDECTOMY LAPAROSCOPIC;  Surgeon: Edward Jolly, MD;  Location: [facility];  Service: General;  Laterality: N/A;  . Appendectomy  12/2012   History   Social History  . Marital Status: Married    Spouse Name: N/A    Children: yes   Occupational History  . Retired, Company secretary    Social History Main Topics  .  Smoking status: Former Smoker -- 2.00 packs/day for 15 years    Types: Cigarettes    Quit date: 07/02/1968  . Smokeless tobacco: Never Used  . Alcohol Use: No  . Drug Use: No   Current Outpatient Prescriptions on File Prior to Visit  Medication Sig Dispense Refill  . amLODipine (NORVASC) 5 MG tablet TAKE ONE TABLET BY MOUTH IN THE MORNING 90 tablet 0  . aspirin EC 81 MG tablet Take 81 mg by mouth every morning.     . brimonidine-timolol (COMBIGAN) 0.2-0.5 % ophthalmic solution Place  1 drop into both eyes every 12 (twelve) hours.    . fluticasone (FLONASE) 50 MCG/ACT nasal spray Place 2 sprays into both nostrils daily. 48 g 3  . lactulose (CHRONULAC) 10 GM/15ML solution Take 15 mLs by mouth 2 (two) times daily as needed (bowel movement).    [name] levothyroxine (SYNTHROID, LEVOTHROID) 25 MCG tablet Take 2 tablets (50 mcg total) by mouth daily before breakfast. 60 tablet 1  . levothyroxine (SYNTHROID, LEVOTHROID) 75 MCG tablet Take 1 tablet (75 mcg total) by mouth daily. With water first thing in the morning. 30 tablet 2  . levothyroxine (SYNTHROID, LEVOTHROID) 88 MCG tablet Take 1 tablet (88 mcg total) by mouth daily. 45 tablet 2  . Linaclotide (LINZESS) 145 MCG CAPS capsule Take 1 capsule (145 mcg total) by mouth daily. 30 capsule 2  . Linaclotide (LINZESS) 145 MCG CAPS capsule Take 1 capsule (145 mcg total) by mouth daily. 30 capsule 3  . omeprazole (PRILOSEC) 20 MG capsule Take 1 capsule (20 mg total) by mouth every morning. 90 capsule 3  . polyethylene glycol (MIRALAX / GLYCOLAX) packet Take 17 g by mouth every morning.     . sildenafil (VIAGRA) 100 MG tablet Take 1 tablet (100 mg total) by mouth as directed. 10 tablet 0  . simvastatin (ZOCOR) 20 MG tablet Take 1 tablet (20 mg total) by mouth every morning. 90 tablet 3  . simvastatin (ZOCOR) 20 MG tablet TAKE ONE TABLET BY MOUTH AT BEDTIME. 90 tablet 0  . tamsulosin (FLOMAX) 0.4 MG CAPS capsule Take 1 capsule (0.4 mg total) by mouth every evening. (Patient taking differently: Take 0.8 mg by mouth every evening. ) 90 capsule 3   No current facility-administered medications on file prior to visit.   Allergies  Allergen Reactions  . Codeine Other (See Comments)    REACTION: hallucinations,faint,dizziness   Family History  Problem Relation Age of Onset  . Colon cancer Paternal Grandfather 66  . Cancer Paternal Grandfather     prostate  . Cancer Father     prostate  . Cancer Brother     prostate   PE: BP 126/78 mmHg   Pulse 91  Temp(Src) 98.1 F (36.7 C) (Oral)  Resp 12  SpO2 94% Body mass index is 27.94 kg/(m^2). Wt Readings from Last 3 Encounters:  03/18/15 173 lb (78.472 kg)  11/15/14 177 lb (80.287 kg)  10/20/14 174 lb 12.8 oz (79.289 kg)   Constitutional: normal weight, in NAD Eyes: PERRLA, EOMI, no exophthalmos, no lid lag, no stare ENT: moist mucous membranes, lumpy-bumpy thyroid, no thyromegaly, no cervical lymphadenopathy Cardiovascular: RRR, No MRG Respiratory: CTA B Gastrointestinal: abdomen soft, NT, ND, BS+ Musculoskeletal: no deformities, strength intact in all 4 Skin: moist, warm, no rashes Neurological: + still mild tremor with outstretched hands (not new), DTR normal in all 4  ASSESSMENT: 1. 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 Post-ablative hypothyroidism  PLAN:  1.  and 2. Patient with previous subclinical hyperthyroidism caused by a R toxic adenoma, now s/p ablation, with subsequent hypothyroidism - now on levothyroxine 88 g daily. He appears euthyroid. Will continue current dose. - Will check them in 3 weeks: TSH, free T4; as last TFTs were 2 weeks ago >> reviewed >> dose of LT4 changed then - I advised him to take the thyroid hormone every day, with water, >30 minutes before breakfast, separated by >4 hours from acid reflux medications, calcium, iron, multivitamins. He is taking it correctly. - RTC in 6 months, but sooner for repeat labs  Orders Placed This Encounter  Procedures  . TSH  . T4, free

## 2015-03-18 NOTE — Patient Instructions (Addendum)
Please come back for labs in ~23 weeks.  Continue Levothyroxine 88 mcg daily for now.  Take the thyroid hormone every day, with water, >30 minutes before breakfast, separated by >4 hours from acid reflux medications, calcium, iron, multivitamins.  Please come back for a follow-up appointment in 6 months.

## 2015-03-23 ENCOUNTER — Other Ambulatory Visit: Payer: Self-pay | Admitting: Pulmonary Disease

## 2015-04-21 ENCOUNTER — Encounter: Payer: Self-pay | Admitting: Pulmonary Disease

## 2015-04-21 ENCOUNTER — Ambulatory Visit (INDEPENDENT_AMBULATORY_CARE_PROVIDER_SITE_OTHER): Payer: Medicare HMO | Admitting: Pulmonary Disease

## 2015-04-21 ENCOUNTER — Ambulatory Visit (INDEPENDENT_AMBULATORY_CARE_PROVIDER_SITE_OTHER)
Admission: RE | Admit: 2015-04-21 | Discharge: 2015-04-21 | Disposition: A | Discharge status: Home / Self Care | Payer: Medicare HMO | Source: Ambulatory Visit | Attending: Pulmonary Disease | Admitting: Pulmonary Disease

## 2015-04-21 ENCOUNTER — Other Ambulatory Visit (INDEPENDENT_AMBULATORY_CARE_PROVIDER_SITE_OTHER): Payer: Medicare HMO

## 2015-04-21 VITALS — BP 122/78 | HR 89 | Temp 98.2°F | Wt 173.0 lb

## 2015-04-21 DIAGNOSIS — N62 Hypertrophy of breast: Secondary | ICD-10-CM

## 2015-04-21 DIAGNOSIS — I10 Essential (primary) hypertension: Secondary | ICD-10-CM

## 2015-04-21 DIAGNOSIS — N401 Enlarged prostate with lower urinary tract symptoms: Secondary | ICD-10-CM

## 2015-04-21 DIAGNOSIS — K5730 Diverticulosis of large intestine without perforation or abscess without bleeding: Secondary | ICD-10-CM

## 2015-04-21 DIAGNOSIS — N138 Other obstructive and reflux uropathy: Secondary | ICD-10-CM

## 2015-04-21 DIAGNOSIS — K5900 Constipation, unspecified: Secondary | ICD-10-CM

## 2015-04-21 DIAGNOSIS — E7800 Pure hypercholesterolemia, unspecified: Secondary | ICD-10-CM | POA: Diagnosis not present

## 2015-04-21 DIAGNOSIS — K219 Gastro-esophageal reflux disease without esophagitis: Secondary | ICD-10-CM

## 2015-04-21 DIAGNOSIS — F411 Generalized anxiety disorder: Secondary | ICD-10-CM

## 2015-04-21 DIAGNOSIS — E890 Postprocedural hypothyroidism: Secondary | ICD-10-CM

## 2015-04-21 DIAGNOSIS — C61 Malignant neoplasm of prostate: Secondary | ICD-10-CM

## 2015-04-21 DIAGNOSIS — K581 Irritable bowel syndrome with constipation: Secondary | ICD-10-CM

## 2015-04-21 LAB — BASIC METABOLIC PANEL
BUN: 14 mg/dL (ref 6–23)
CO2: 30 mEq/L (ref 19–32)
Calcium: 9.3 mg/dL (ref 8.4–10.5)
Chloride: 106 mEq/L (ref 96–112)
Creatinine, Ser: 1.11 mg/dL (ref 0.40–1.50)
GFR: 83.12 mL/min (ref >60.00)
Glucose, Bld: 113 mg/dL — ABNORMAL HIGH (ref 70–99)
POTASSIUM: 3.9 meq/L (ref 3.5–5.1)
SODIUM: 141 meq/L (ref 135–145)

## 2015-04-21 LAB — CBC WITH DIFFERENTIAL/PLATELET
Basophils Absolute: 0 10*3/uL (ref 0.0–0.1)
Basophils Relative: 0.7 % (ref 0.0–3.0)
EOS PCT: 2.8 % (ref 0.0–5.0)
Eosinophils Absolute: 0.1 10*3/uL (ref 0.0–0.7)
HCT: 45.3 % (ref 39.0–52.0)
Hemoglobin: 14.9 g/dL (ref 13.0–17.0)
LYMPHS ABS: 1.4 10*3/uL (ref 0.7–4.0)
Lymphocytes Relative: 37.6 % (ref 12.0–46.0)
MCHC: 32.8 g/dL (ref 30.0–36.0)
MCV: 89.3 fl (ref 78.0–100.0)
MONO ABS: 0.4 10*3/uL (ref 0.1–1.0)
Monocytes Relative: 10 % (ref 3.0–12.0)
NEUTROS ABS: 1.8 10*3/uL (ref 1.4–7.7)
NEUTROS PCT: 48.9 % (ref 43.0–77.0)
PLATELETS: 244 10*3/uL (ref 150.0–400.0)
RBC: 5.07 Mil/uL (ref 4.22–5.81)
RDW: 15 % (ref 11.5–15.5)
WBC: 3.6 10*3/uL — ABNORMAL LOW (ref 4.0–10.5)

## 2015-04-21 LAB — HEPATIC FUNCTION PANEL
ALBUMIN: 4 g/dL (ref 3.5–5.2)
ALK PHOS: 43 U/L (ref 39–117)
ALT: 9 U/L (ref 0–53)
AST: 10 U/L (ref 0–37)
BILIRUBIN DIRECT: 0.1 mg/dL (ref 0.0–0.3)
Total Bilirubin: 0.4 mg/dL (ref 0.2–1.2)
Total Protein: 7 g/dL (ref 6.0–8.3)

## 2015-04-21 LAB — LIPID PANEL
CHOL/HDL RATIO: 3
CHOLESTEROL: 162 mg/dL (ref 0–200)
HDL: 54.8 mg/dL (ref >39.00)
LDL CALC: 94 mg/dL (ref 0–99)
NonHDL: 107.44
Triglycerides: 66 mg/dL (ref 0.0–149.0)
VLDL: 13.2 mg/dL (ref 0.0–40.0)

## 2015-04-21 LAB — TSH: TSH: 2.29 u[IU]/mL (ref 0.35–4.50)

## 2015-04-21 MED ORDER — LINACLOTIDE 290 MCG PO CAPS: 290.0000 ug | ORAL_CAPSULE | Freq: Every day | ORAL | Status: DC

## 2015-04-21 MED ORDER — LINACLOTIDE 145 MCG PO CAPS: 290.0000 ug | ORAL_CAPSULE | Freq: Every day | ORAL | Status: DC

## 2015-04-21 NOTE — Patient Instructions (Signed)
Today we updated your med list in our EPIC system...    Continue your current medications the same...  We decided to increase the LINZESS to '290mg'$  one tab daily...  We will arrange for a GI appt for further evaluation of youtr refractory constipation problem...  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    I feel you should stop the [name] (Finasteride) as we previously discussed...    I understand that [name] wants you to continue this med, but he needs to know that the breast tissue is getting worse for his recommendations on how to deal with this issue...  Today we did your follow up CXR & FASTING blood work...    We will contact you w/ the results when available...   Call for any questions...  Let's plan a follow up visit in 21mo sooner if needed for problems..Marland KitchenMarland Kitchen

## 2015-04-21 NOTE — Progress Notes (Addendum)
Subjective:     Patient ID: Alan [name], male   DOB: Nov 09, 1940, 74 y.o.   MRN: [id number]  HPI 74 y/o BM here for a follow up visit... he has mult med problems as noted below...  ~  SEE PREV EPIC NOTES FOR OLDER DATA >>    LABS 10/13:  FLP- at goals on Simva20;  Chems- wnl x BS=108;  CBC- wnl;  TSH=0.34 & we will recheck w/ FreeT3 & FreeT4...  ADDENDUM>> f/u TFTs 10/13 showed normal TSH=0.93 & normal FreeT3 & FreeT4...   LABS from July-Aug2014 reviewed in [facility]...   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 We reviewed the following medical problems during today's office visit >>     HBP>  On ASA81, Norvasc5; BP= 138/70 & he denies CP, palpit, SOB, edema, etc...    CHOL>  On Simva20 + diet; FLP 9/15 showed TChol 135, TG 66, HDL 38, LDL 84; continue same med & diet...    GERD>  On Prilosec20; he had LapAppy 8/14 by DrHoxworth & now back to baseline w/o abd pain, n/v, c/d, blood seen...    Divertics, IBS w/ constip, Colon Polyps>  Off prev Linzess, Chronulac, Miralax; had colonoscopy 2/14 w/ int hems, otherw wnl, & f/u rec in 780yr..    BPH>  On [name]5 & Flomax0.4; followed by [name] (he does pt's PSA) w/ [facility]; pt tells me he was seen 7/15 & they plan Bx of prostate soon (we don't have notes).    RLS>  Not on meds his symptoms are not that bad & he is managing well "I deal w/ it- walking helps"...    Anxiety>  Not on meds and states he is doing well overall... We  reviewed prob list, meds, xrays and labs> see below for updates >>   EKG 9/15 showed NSR, rate78, wnl...  LABS 9/15:  FLP- wnl on Simva20;  Chems- wnl;  CBC- wnl;  TSH=0.02... 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  ~  April; 20, 2016:  36m33moV & medical follow up> he was seen by [name] for Endocrine- subclinical hyperthyroidism=> toxic adenoma (warm nodule right lower pole) on further eval w/ RAI uptake & scan and treated w/ RAI 06/2014; they have continued to follow up post treatment and started Synthroid 63m336mhen his TSH rose to 9.15, he has a recheck scheduled in 81mo 136moey plan recheck thyroid labs at that time...     Today Alan [name]:  1) decreased hearing- no cerumen impactions on exam & we rec referral to ENT office for audiology testing;  2) left breast is sore/ swollen- exam shows gynecomastia &he is on [name], asked to stop this med & notify urology; he was also Dx  w/ Prostate Ca by [name] on watchful waiting;  3) constipation- prev on Linzess but he stopped & Miralax etc not working (needs enema or suppos to go), rec to restart the Linzess145/d & let us know...  We reviewed the following medical problems during today's office visit >>     HBP>  On ASA81, Norvasc5; BP= 134/70 & he denies CP, palpit, SOB, edema, etc...    CHOL>  On Simva20 + diet; FLP 9/15 showed TChol 135, TG 66, HDL 38, LDL 84; continue same med & diet...    Thyroid adenoma w/ hyperthyroidism> referred to [name] w/ RAI uptake & scan w/ warm nodule right lower pole; s/p RAI rx 06/2014=> subseq started Synthroid33mg/d & they continue to follow...    GERD>  On Prilosec20; he had LapAppy 8/14 by DrHoxworth & now back to baseline w/o abd pain, n/v, c/d, blood seen...    Divertics, IBS w/ constip,  Colon Polyps>  On Chronulac & Miralax but needs enema or suppos to go; had colonoscopy 2/14 w/ int hems, otherw wnl, & f/u rec in 738yr rec to restart [id number] let usKoreanow response...    BPH, prostate ca>  On [name]5 & Flomax0.4 per [name] w/ [facility]; rising PSA=> Bx 9/15 pos for sm vol prostate cancer, on watchful waiting protocol & Urology follow up; he developed left gynecomastia 4/16 & [name] stopped=> he will notify Urology...    RLS>  Not on meds his symptoms are not that bad & he is managing well "I deal w/ it- walking helps"...    Anxiety>  Not on meds and states he is doing well overall... We reviewed prob list, meds, xrays and labs> see below for updates >>  PLAN>> we will refer to ENT for hearing eval; stop [name] due to gynecomastia & notify [name]; start Linzess145 daily & he will let usKoreanow how it is working so we can assess the dose...   ~  April 21, 2015:  68m23moV and Alan [name] in the interval w/ elev TSH after his RAI treatment for thyroid adenoma- now on Synthroid88 and TSH looks good;  He is still c/o bilat gynecomastia L>R & sl larger & sl tender, this is likely due to the [name] but he tells me that [name] wouldn't stop it (known prostate cancer on watchful waiting)- I will forward this note to DrT and asked pt to contact him regarding options for treating the prostate cancer & getting off the [name];  Also c/o persistent constipation- he tried Linzess145& says it initially helped, then stopped working so he stopped it rather than calling for instructions; we discussed increase to Linzess290 daily & we will trefer to GI for further eval... We reviewed the following medical problems during today's office visit >>     New problem 10/16:  RUL oblong pulm nodule> ?etiology- he is asymptomatic & exam is neg, ex-smoker w/ 30pack-yr hx and quit 1970, we will proceed w/ CT Chest => pending    HBP>   On ASA81, Norvasc5; BP= 122/78 & he denies CP, palpit, SOB, edema, etc...    CHOL>  On Simva20 + diet; FLP 10/20 showed TChol 162, TG 66, HDL 55, LDL 94; continue same med & diet...    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    GERD>  On Prilosec20; he had LapAppy 8/14 by DrHoxworth & now back to baseline w/o abd pain, dysphagia, n/v, diarrhea, blood seen, etc...    Divertics, IBS w/ constip, Colon Polyps>  On Chronulac & Miralax but still needs enema or suppos to go; had colonoscopy 2/14 w/ int hems, otherw wnl, & f/u rec in 67yr; [facility] '[facility]'$ /d...     BPH w/ BOO, prostate ca>  On [name]5 & Flomax0.8 per [name] w/ [facility]; rising PSA=> Bx 9/15 pos for sm vol prostate cancer, on watchful waiting protocol & Urology follow up; he developed left gynecomastia 4/16 & I rec stopping the [name] but he tells me that DrT continued this med, now gynecomastia getting worse & looks like he'll need alternative treatment for the prostate ca & get off the [name]...     RLS>  Not on meds, his symptoms are not that bad & he is managing well "I deal w/ it- walking helps"...    Anxiety>  Not on meds and states he is doing well overall... We reviewed prob list, meds, xrays and labs> see below for updates >>   CXR 04/21/15 shows norm heart size, new oblong RUL nodular lesion, otherw neg/ no adenopathy, etc => proceed w/ CT Chest  LABS 04/21/15:  FLP- at goals on Simva20;  Chems- wnl x BS=113;  CBC- wnl;  TSH=2.29 on Synthroid88... PLAN>>  CXR shows new oblong RUL opacity ?etiology- no resp symptoms, neg exam, norm CBC etc => we will proceed w/ further eval & CT Chest;  Gynecomastia is getting worse, still on [name], he is asked to f/u w/  [name] ASAP & [name] alternative prostate cancer treatments & stopping the Finasteride;  He will increase the Linzess to '290mg'$  daily & continue the Miralax/ Chronulac => follow up w/ GI id constipation not resolved on this regimen...  ADDENDUM>>  CT Chest 05/02/15 showed norm heart size, atherosclerotic Ao, mild mediastinal adenopathy, marked left gynecomastia, mild centrilobular emphysema, posterior RUL nodule measures 2.2 x 2.6 cm, small 472msubpleural RLL nodule, ?sub-solid 43m68meft apical nodule ==> discussed w/ pt, proceed w/ PET Scan...  ADDENDUM>>  PET/CT done 05/18/16 showed 2.8cm hypermet posterior RUL nodule, +hypermet right hilar & right paratracheal adenopathy; asymmetric left breast soft tissue density w/ low-grade metabolic activ (differential betw gynecomastia vs breast cancer- consider mammogram etc) ==> discussed w/ pt, I believe the best approach to Bx/tissue dx would be TCTS referral for EBUS/mediastinoscopy...          Problem List:   OBSTRUCTIVE SLEEP APNEA (ICD-327.23) - s/p sleep study 1/08 showing RDI=16, and desat to 89%... he had 4+leg jerks and given trial Requip (?helped)... states he rests well and wakes feeling refreshed most of the time "I live w/ it, it's not that bad"...  RUL PULMONARY NODULE >> new finding on routine CXR 04/21/15 => work up in progress... ~  CXR 04/21/15 shows norm heart size, new oblong RUL nodular lesion, otherw neg/ no adenopathy, etc => proceed w/ CT Chest ~  CT Chest 05/02/15 showed norm heart size, atherosclerotic Ao, mild mediastinal adenopathy, marked left gynecomastia, mild centrilobular emphysema, posterior RUL nodule measures 2.2 x 2.6 cm, small 4mm43mbpleural RLL nodule, ?sub-solid 43mm 74mt apical nodule ==> discussed w/ pt, proceed w/ PET Scan  HYPERTENSION (ICD-401.9) - controlled on NORVASC '5mg'$ /d, and prev on Cardura but  this was switched by [name]...  ~  10/13: BP=118/82, tol meds well... denies HA, fatigue, visual  changes, CP, palipit, dizziness, syncope, dyspnea, edema, etc... he had a Cardiolite scan in 1997 that was neg/ normal w/ EF=75%... ~  CXR 4/14 showed norm heart size, atherosclerotic in ThorAo, clear lungs, NAD... ~  12/14: on ASA81, Norvasc5; BP= 120/84 & he denies CP, palpit, SOB, edema, etc ~  9/15: on ASA81, Norvasc5; BP= 138/70 & he denies CP, palpit, SOB, edema, etc ~  4/16: on ASA81, Norvasc5; BP= 134/70 & he remains asymptomatic... ~  10/16: on ASA81, Norvasc5; BP= 122/78 & he denies CP, palpit, SOB, edema, etc  HYPERCHOLESTEROLEMIA (ICD-272.0) - on SIMVASTATIN '20mg'$ /d + diet...  ~  [employer] 3/08 showed TChol 157, TG 53, HDL 53, LDL 94 ~  FLP 3/09 showed TChol 124, TG 33, HDL 44, LDL 74 ~  FLP 4/10 showed TChol 123, TG 36, HDL 41, LDL 75 ~  FLP 3/12 showed TChol 148, TG 47, HDL 49, LDL 89... continue Simva20. ~  [employer] 10/13 on Simva20 showed TChol 146, TG 68, HDL 45, LDL 88... Continue same. ~  FLP 9/15 on Simva20 showed TChol 135, TG 66, HDL 38, LDL 84 ~  FLP 10/16 on simva20 showed TChol 162, TG 66, HDL 55, LDL 94  SUBCLINICAL HYPERTHYROIDISM >> see notes above...  ~  Labs 9/15 showed TSH=0.02 and repeat labs showed TSH=0.03, FreeT3=3.5 (2.3-4.2), FreeT4=1.09 (0.6-1.6) => we will refer to endocrine for further eval... ~  Seen by [name] w/ RAI uptake & scan showing a warm nodule right lower pole; treated 06/2014 w/ RAI & started on Synthroid 07/2014, currently 8mg/d & they plan f/u soon... ~  10/16: he has developed post-ablative hypothyroidism & Synthroid titrated up to 867m/d; TSH 04/21/15 => 2.29, continue same...  GERD (ICD-530.81) - prev rec to take Prevacid '30mg'$  & Zantac 150Qhs... GI eval 1/11 by DrDemetra Shiner/ rec to take PRILOSEC daily ~  EGD 5/02 showing esophagitis and gastritis... ~  EGD 1/11 showed GE junction erosions & duodenitis... Rx Prilosec & improved symptomatically.  DIVERTICULOSIS OF COLON (ICD-562.10)  IRRITABLE BOWEL SYNDROME w/ CONSTIPATION  COLONIC POLYPS  (ICD-211.3) Acute APPENDICITIS w/ surg 7/14  ~  colonoscopy 5/07 by DrSam showing divertics and 3-25m17mdenomatous polyps removed... f/u planned 23yr17yr ~  colonoscopy 1/11 w/ several pedunculated polyps & path was neg- benign colonic mucosa w/o adenomatous change. ~  Colonoscopy 2/14 by [name] showed int hems, otherw neg, and f/u rec in 7 yrs...  ~  5/14: initial hosp for RLQ pain & dx w/ appendicitis- treated non-operatively ~  CT Abd & Pelvis 7/14 showed suspected acute on chr perforated appendicitis w/ sm periappendiceal fluid collection, no free air; enlarged prostate, mild degen changes in Tspine, sm bilat renal cysts...  ~  C/o worsening constipation & treated w/ chronulac/ miralax/ LINZESS145=> initially helped then he stopped it; Rec to incr Linzess to '290mg'$ /d & follow up w/ GI if not resolved...  BENIGN PROSTATIC HYPERTROPHY, HX OF (ICD-V13.8) >>  PROSTATE CANCER >>  GYNECOMASTIA >>  ~  seen by DrHumphries previously=> now seeing [name] but pt doesn't feel that he is doing as well on the FLOMAX 0.'4mg'$ /d (prev on [name] + Cardura)... I've encouraged him to [name] this w/ [name]... 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 ~  5/14:  He was seen by [name] during his 5/14 hosp for appendicitis; CT showed prostatic enlargement, BPH, BOO, & he was rec to f/u as outpt for poss cysto... ~  9/15:  He tells me he saw [name] 7/15 w/ elev PSA & they plan prostate bx soon (we don't have recent notes from them)... ~  9/15:  Diagnosed w/ prostate cancer on bx by [name]- Gleason 3+4=7, PSA was 4.3, pos FamHx w/ father & one brother passing from prostate ca; He is on [name], Flomax, watchful waiting...  ~  4/16:  He has developed gynecomastia likely due to the Finasteride med- he tells  me that [name] doesn't want to stop this med... ~  10/16:  He is c/o worsening L>R gynecomastia & still on [name]; he is asked to f/u w/ [name] to [name] alternative prostate cancer treatments so he can come off the [name]...  ERECTILE DYSFUNCTION (ICD-302.72) - Tried all 3 PDE4 inhibitors, but not much benefit... consider shots.  ~  labs in 10/03/08 at Urology office was 1.47 per patient. ~  04-Oct-1999: he tells me he had his PSA checked at [place] it was "OK"...  Hx POS RPR and T-PALLIDUM Antibodies >> labs confirmed pos in 10/03/2009 and he went to [place] at health dept for 2nd opinion; labs confirmed & DrRobinson wanted him to get Neuro eval w/ LP; seen by DrYan for [facility]- CSF studies to T-Pallidum were neg; treated w/ series of 3 Benzathine PenG injections...  ~  MRI Brain 8/11 showed a few nonspecific areas of bifrontal gliosis and a partially empty sella...   RESTLESS LEG SYNDROME (ICD-333.94) - prev on Requip '1mg'$ Qhs, but stopped this med due to stomach side effects ... he notes no diff w/ resting- good nights, and bad nights...   ANXIETY (ICD-300.00) - he does not desire anxiolytic Rx...  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   Past Medical History  Diagnosis Date  . Sleep apnea     does not use cpap  . Unspecified essential hypertension   . Hypercholesteremia   . GERD (gastroesophageal reflux disease)   . Diverticulosis of colon (without mention of hemorrhage)   . Irritable bowel syndrome   . Benign neoplasm of colon     colon polyps  . Benign hypertrophy of prostate   . Erectile dysfunction   . Restless legs syndrome (RLS)   . Anxiety state, unspecified   . Onychomycosis   . Constipation, chronic     Past Surgical History  Procedure Laterality Date  . None    . Colonoscopy    . Laparoscopic appendectomy N/A 01/27/2013    Procedure: APPENDECTOMY LAPAROSCOPIC;  Surgeon: Edward Jolly, MD;  Location: [facility];   Service: General;  Laterality: N/A;  . Appendectomy  12/2012    Outpatient Encounter Prescriptions as of 04/21/2015  Medication Sig  . amLODipine (NORVASC) 5 MG tablet TAKE ONE TABLET BY MOUTH IN THE MORNING  . aspirin EC 81 MG tablet Take 81 mg by mouth every morning.   . brimonidine-timolol (COMBIGAN) 0.2-0.5 % ophthalmic solution Place 1 drop into both eyes every 12 (twelve) hours.  . fluticasone (FLONASE) 50 MCG/ACT nasal spray Place 2 sprays into both nostrils daily.  [name] lactulose (CHRONULAC) 10 GM/15ML solution Take 15 mLs by mouth 2 (two) times daily as needed (bowel movement).  [name] levothyroxine (SYNTHROID, LEVOTHROID) 88 MCG tablet Take 1 tablet (88 mcg total) by mouth daily.  [name] omeprazole (PRILOSEC) 20  MG capsule Take 1 capsule (20 mg total) by mouth every morning.  . polyethylene glycol (MIRALAX / GLYCOLAX) packet Take 17 g by mouth every morning.   . sildenafil (VIAGRA) 100 MG tablet Take 1 tablet (100 mg total) by mouth as directed.  . simvastatin (ZOCOR) 20 MG tablet TAKE ONE TABLET BY MOUTH AT BEDTIME.  . tamsulosin (FLOMAX) 0.4 MG CAPS capsule Take 1 capsule (0.4 mg total) by mouth every evening. (Patient taking differently: Take 0.8 mg by mouth every evening. )  . [DISCONTINUED] Linaclotide (LINZESS) 145 MCG CAPS capsule Take 1 capsule (145 mcg total) by mouth daily.    Allergies  Allergen Reactions  . Codeine Other (See Comments)    REACTION: hallucinations,faint,dizziness    Current Medications, Allergies, Past Medical History, Past Surgical History, Family History, and Social History were reviewed in [employer] record.   Review of Systems        See HPI - all other systems neg except as noted... The patient c/o constipation and left gynecomastia; he denies anorexia, fever, weight loss, weight gain, vision loss, decreased hearing, hoarseness, chest pain, syncope, dyspnea on exertion, peripheral edema, prolonged cough, headaches, hemoptysis,  abdominal pain, melena, hematochezia, severe indigestion/heartburn, hematuria, incontinence, muscle weakness, suspicious skin lesions, transient blindness, difficulty walking, depression, unusual weight change, abnormal bleeding, enlarged lymph nodes, and angioedema.     Objective:   Physical Exam    WD, WN, 74 y/o BM in NAD... GENERAL:  Alert & oriented; pleasant & cooperative... HEENT:  [place]/AT, EOM-wnl, PERRLA, EACs-clear, TMs-wnl, NOSE-clear, THROAT-clear & wnl. NECK:  Supple w/ full ROM; no JVD; normal carotid impulses w/o bruits; no thyromegaly or nodules palpated; no lymphadenopathy. CHEST:  Clear to P & A; without wheezes/ rales/ or rhonchi. Chest Wall>  L>R gynecomastia, tender on paplation HEART:  Regular Rhythm; without murmurs/ rubs/ or gallops. ABDOMEN:  Soft & nontender; normal bowel sounds; no organomegaly or masses detected. EXT: without deformities, mild arthritic changes; no varicose veins/ venous insuffic/ or edema. NEURO:  CN's intact; motor testing normal; sensory testing normal; gait normal & balance OK. DERM:  No lesions noted; no rash etc...  RADIOLOGY DATA:  Reviewed in the EPIC EMR & discussed w/ the patient...  LABORATORY DATA:  Reviewed in the EPIC EMR & discussed w/ the patient...   Assessment:      NEW RUL oblong shaped pulmonary nodule>> routine CXR 04/21/15 showed RUL nodule- pt asymptomatic, quit smoking 1970, & we will proceed w/ CT Chest...   HBP>  Well controlled on Amlod5; continue same + diet & low sodium...  CHOL>  Well regulated on Simva20; continue same...  Hyperthyroid, toxic adenoma, s/p RAI & post-ablative hypothyroid>  TSH now wnl at 2.29 on Synthroid88...  GERD>  Stable on Prilosec20; continue same Rx...  Divertics, IBS, Colon Polyps, constipation>  He states Linzess145 stopped working; Rec to use the Miralax + [employer] '290mg'$ /d, follow up w/ GI...  S/P Appendectomy 8/14 by DrHoxworth...  BPH, prostate cancer, gynecomastia>  Followed  by [name] on Flomax & [name] but gynecomastia is getting worse 7 he is encouraged to [name] alternative prostate cancer treatments so he can stop the [name]...  RLS>  States it's not that bad & not requiring meds...  Anxiety>  Similarly, he notes not that bad & not on meds...     Plan:     Patient's Medications  New Prescriptions   LINACLOTIDE (LINZESS) 290 MCG CAPS CAPSULE    Take 1 capsule (290 mcg total) by  mouth daily.  Previous Medications   AMLODIPINE (NORVASC) 5 MG TABLET    TAKE ONE TABLET BY MOUTH IN THE MORNING   ASPIRIN EC 81 MG TABLET    Take 81 mg by mouth every morning.    BRIMONIDINE-TIMOLOL (COMBIGAN) 0.2-0.5 % OPHTHALMIC SOLUTION    Place 1 drop into both eyes every 12 (twelve) hours.   FLUTICASONE (FLONASE) 50 MCG/ACT NASAL SPRAY    Place 2 sprays into both nostrils daily.   LACTULOSE (CHRONULAC) 10 GM/15ML SOLUTION    Take 15 mLs by mouth 2 (two) times daily as needed (bowel movement).   LEVOTHYROXINE (SYNTHROID, LEVOTHROID) 88 MCG TABLET    Take 1 tablet (88 mcg total) by mouth daily.   OMEPRAZOLE (PRILOSEC) 20 MG CAPSULE    Take 1 capsule (20 mg total) by mouth every morning.   POLYETHYLENE GLYCOL (MIRALAX / GLYCOLAX) PACKET    Take 17 g by mouth every morning.    SILDENAFIL (VIAGRA) 100 MG TABLET    Take 1 tablet (100 mg total) by mouth as directed.   SIMVASTATIN (ZOCOR) 20 MG TABLET    TAKE ONE TABLET BY MOUTH AT BEDTIME.   TAMSULOSIN (FLOMAX) 0.4 MG CAPS CAPSULE    Take 1 capsule (0.4 mg total) by mouth every evening.  Modified Medications   No medications on file  Discontinued Medications   LINACLOTIDE (LINZESS) 145 MCG CAPS CAPSULE    Take 1 capsule (145 mcg total) by mouth daily.   LINACLOTIDE (LINZESS) 145 MCG CAPS CAPSULE    Take 1 capsule (145 mcg total) by mouth daily.   SIMVASTATIN (ZOCOR) 20 MG TABLET    Take 1 tablet (20 mg total) by mouth every morning.

## 2015-04-25 ENCOUNTER — Encounter: Payer: Self-pay | Admitting: Gastroenterology

## 2015-04-25 ENCOUNTER — Other Ambulatory Visit: Payer: Self-pay | Admitting: Pulmonary Disease

## 2015-04-25 DIAGNOSIS — R911 Solitary pulmonary nodule: Secondary | ICD-10-CM

## 2015-05-02 ENCOUNTER — Ambulatory Visit (INDEPENDENT_AMBULATORY_CARE_PROVIDER_SITE_OTHER)
Admission: RE | Admit: 2015-05-02 | Discharge: 2015-05-02 | Disposition: A | Discharge status: Home / Self Care | Payer: Medicare HMO | Source: Ambulatory Visit | Attending: Pulmonary Disease | Admitting: Pulmonary Disease

## 2015-05-02 DIAGNOSIS — R911 Solitary pulmonary nodule: Secondary | ICD-10-CM

## 2015-05-05 ENCOUNTER — Other Ambulatory Visit: Payer: Self-pay | Admitting: Pulmonary Disease

## 2015-05-05 DIAGNOSIS — R918 Other nonspecific abnormal finding of lung field: Secondary | ICD-10-CM

## 2015-05-13 ENCOUNTER — Ambulatory Visit (HOSPITAL_COMMUNITY): Payer: Medicare HMO

## 2015-05-19 ENCOUNTER — Encounter (HOSPITAL_COMMUNITY)
Admission: RE | Admit: 2015-05-19 | Discharge: 2015-05-19 | Disposition: A | Discharge status: Home / Self Care | Payer: Medicare HMO | Source: Ambulatory Visit | Attending: Pulmonary Disease | Admitting: Pulmonary Disease

## 2015-05-19 DIAGNOSIS — R918 Other nonspecific abnormal finding of lung field: Secondary | ICD-10-CM | POA: Diagnosis present

## 2015-05-19 LAB — GLUCOSE, CAPILLARY: GLUCOSE-CAPILLARY: 115 mg/dL — AB (ref 65–99)

## 2015-05-19 MED ORDER — FLUDEOXYGLUCOSE F - 18 (FDG) INJECTION
9.1800 | Freq: Once | INTRAVENOUS | Status: DC | PRN
  Administered 2015-05-19: 9.18 via INTRAVENOUS
  Filled 2015-05-19: qty 9.18

## 2015-05-20 ENCOUNTER — Telehealth: Payer: Self-pay | Admitting: Pulmonary Disease

## 2015-05-20 DIAGNOSIS — R911 Solitary pulmonary nodule: Secondary | ICD-10-CM

## 2015-05-20 NOTE — Telephone Encounter (Signed)
Per SN>> SN has spoken with pt about PET scan results. Order referral to cardiothoracic surgery for biopsy of right paratrachieal node pvia EBIVS or mediastinoscopy   Order placed  Nothing further is needed

## 2015-05-20 NOTE — Telephone Encounter (Signed)
Called spoke with pt. He reports he is returning Dr. Jeannine Kitten call. I don't see anything in EPIC. Pt thinks it was concerning his results. Please advise Dr. Lenna Gilford thanks

## 2015-05-25 ENCOUNTER — Institutional Professional Consult (permissible substitution) (INDEPENDENT_AMBULATORY_CARE_PROVIDER_SITE_OTHER): Payer: Medicare HMO | Admitting: Surgery

## 2015-05-25 ENCOUNTER — Other Ambulatory Visit: Payer: Self-pay | Admitting: *Deleted

## 2015-05-25 ENCOUNTER — Encounter: Payer: Self-pay | Admitting: Surgery

## 2015-05-25 VITALS — BP 157/88 | HR 96 | Resp 20 | Ht 66.0 in | Wt 170.0 lb

## 2015-05-25 DIAGNOSIS — R911 Solitary pulmonary nodule: Secondary | ICD-10-CM

## 2015-05-25 DIAGNOSIS — R918 Other nonspecific abnormal finding of lung field: Secondary | ICD-10-CM

## 2015-05-25 DIAGNOSIS — R590 Localized enlarged lymph nodes: Secondary | ICD-10-CM

## 2015-05-25 NOTE — Progress Notes (Signed)
Cardiothoracic Surgery Consultation  PCP is [name], MD Referring Provider is [name], MD  Chief Complaint  Patient presents with  . Lung Lesion    surgical eval on right lung nodule, PET Scan 05/19/15, Chest CT 05/02/15    HPI:  The patient is a 74 year old gentleman with a remote history of heavy smoking, prostate cancer being followed by Dr. 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 Lenna Gilford last month. This showed a RUL lung nodule. CT of the chest shows a 2.2 x 2.6 cm spiculated lesion in the posterior RUL with a 6 mm sub-solid nodule in the left apex and bilateral tiny subpleural pulmonary nodules. There is a 1.3 cm right paratracheal to pretracheal lymph node. PET scan shows hypermetabolic activity in the RUL lesion with an SUV of 12.3. The right paratracheal lymph node has an SUV of 9.5. And there is some asymmetric hypermetabolic activity in the right hilum with an SUV of 4.6. The LUL lesion has no hypermetabolic activity. The left breast soft tissue density has mild hypermetabolic activity with an SUV of 3.3. He says that he feels fine.  Past Medical History  Diagnosis Date  . Sleep apnea     does not use cpap  . Unspecified essential hypertension   . Hypercholesteremia   . GERD (gastroesophageal reflux disease)   . Diverticulosis of colon (without mention of hemorrhage)   . Irritable bowel syndrome   . Benign neoplasm of colon     colon polyps  . Benign hypertrophy of prostate   . Erectile dysfunction   . Restless legs syndrome (RLS)   . Anxiety state, unspecified   . Onychomycosis   . Constipation, chronic     Past Surgical History  Procedure Laterality Date  . None    . Colonoscopy    . Laparoscopic appendectomy N/A 01/27/2013    Procedure: APPENDECTOMY LAPAROSCOPIC;  Surgeon: Edward Jolly, MD;  Location: [facility];  Service:  General;  Laterality: N/A;  . Appendectomy  12/2012    Family History  Problem Relation Age of Onset  . Colon cancer Paternal Grandfather 8  . Cancer Paternal Grandfather     prostate  . Cancer Father     prostate  . Cancer Brother     prostate    Social History Social History  Substance Use Topics  . Smoking status: Former Smoker -- 2.00 packs/day for 15 years    Types: Cigarettes    Quit date: 07/02/1968  . Smokeless tobacco: Never Used  . Alcohol Use: No    Current Outpatient Prescriptions  Medication Sig Dispense Refill  . amLODipine (NORVASC) 5 MG tablet TAKE ONE TABLET BY MOUTH IN THE MORNING 90 tablet 0  . aspirin EC 81 MG tablet Take 81 mg by mouth every morning.     . brimonidine-timolol (COMBIGAN) 0.2-0.5 % ophthalmic solution Place 1 drop into both eyes every 12 (twelve) hours.    . fluticasone (FLONASE) 50 MCG/ACT nasal spray Place 2 sprays into both nostrils daily. 48 g 3  . lactulose (CHRONULAC) 10 GM/15ML solution Take 15 mLs by mouth 2 (two) times daily as needed (bowel movement).    [name] levothyroxine (SYNTHROID, LEVOTHROID) 88 MCG tablet Take 1 tablet (88 mcg total) by mouth daily. 45 tablet 2  . Linaclotide (LINZESS) 290 MCG CAPS capsule Take 1 capsule (290 mcg total) by mouth  daily. 30 capsule prn  . omeprazole (PRILOSEC) 20 MG capsule Take 1 capsule (20 mg total) by mouth every morning. 90 capsule 3  . polyethylene glycol (MIRALAX / GLYCOLAX) packet Take 17 g by mouth every morning.     . sildenafil (VIAGRA) 100 MG tablet Take 1 tablet (100 mg total) by mouth as directed. 10 tablet 0  . simvastatin (ZOCOR) 20 MG tablet TAKE ONE TABLET BY MOUTH AT BEDTIME. 90 tablet 0  . tamsulosin (FLOMAX) 0.4 MG CAPS capsule Take 1 capsule (0.4 mg total) by mouth every evening. (Patient taking differently: Take 0.8 mg by mouth every evening. ) 90 capsule 3   No current facility-administered medications for this visit.    Allergies  Allergen Reactions  . Codeine Other  (See Comments)    REACTION: hallucinations,faint,dizziness    Review of Systems  Constitutional: Positive for fatigue and unexpected weight change. Negative for fever, chills, activity change and appetite change.  HENT: Negative.   Eyes: Negative.   Respiratory: Positive for cough. Negative for chest tightness and shortness of breath.   Cardiovascular: Negative for chest pain, palpitations and leg swelling.  Gastrointestinal: Positive for constipation.       Reflux  Endocrine: Negative.   Genitourinary: Positive for frequency.       BPH and prostate cancer  Musculoskeletal: Negative.   Skin: Negative.   Allergic/Immunologic: Negative.   Neurological: Negative.   Hematological: Negative.   Psychiatric/Behavioral: Negative.     BP 157/88 mmHg  Pulse 96  Resp 20  Ht '5\' 6"'$  (1.676 m)  Wt 170 lb (77.111 kg)  BMI 27.45 kg/m2  SpO2 98% Physical Exam  Constitutional: He is oriented to person, place, and time. He appears well-developed and well-nourished. No distress.  HENT:  Head: Normocephalic and atraumatic.  Mouth/Throat: Oropharynx is clear and moist.  Eyes: EOM are normal. Pupils are equal, round, and reactive to light.  Neck: Normal range of motion. Neck supple. No JVD present. No tracheal deviation present. No thyromegaly present.  Cardiovascular: Normal rate, regular rhythm, normal heart sounds and intact distal pulses.   No murmur heard. Pulmonary/Chest: Effort normal and breath sounds normal. No respiratory distress. He has no wheezes. He has no rales.  Abdominal: Soft. Bowel sounds are normal. He exhibits no distension and no mass. There is no tenderness.  Musculoskeletal: Normal range of motion. He exhibits no edema.  Lymphadenopathy:    He has no cervical adenopathy.  Neurological: He is alert and oriented to person, place, and time. No cranial nerve deficit.  Skin: Skin is warm and dry.  Psychiatric: He has a normal mood and affect.     Diagnostic  Tests:  CLINICAL DATA: Right upper lobe lung nodule on routine chest x-ray. Prostate cancer diagnosed 1 year ago. Ex smoker, quitting 46 years ago.  EXAM: CT CHEST WITHOUT CONTRAST  TECHNIQUE: Multidetector CT imaging of the chest was performed following the standard protocol without IV contrast.  COMPARISON: Chest radiograph of 04/21/2015.  FINDINGS: Mediastinum/Nodes: No supraclavicular adenopathy. Marked probable left-sided gynecomastia on image/series 30/2. No axillary adenopathy. Aortic and branch vessel atherosclerosis. Normal heart size, without pericardial effusion. Low right paratracheal/precarinal node is mildly enlarged at 1.3 cm on image/series 22/2.  Hilar regions poorly evaluated without intravenous contrast.  Lungs/Pleura: No pleural fluid. Mild centrilobular emphysema. Corresponding to the plain film abnormality, a spiculated posterior right upper lobe pulmonary nodule measures 2.2 x 2.6 cm on image/series 14/3. 2.2 cm craniocaudal on image/series 51/603.  Anterior medial right upper  lobe pulmonary nodule measures 6 mm on image/series 29/3  Subpleural right lower lobe 4 mm nodule on image 34.  Probable sub solid pulmonary nodule in the left apex at 6 mm on image 9.  Other smaller pulmonary nodules which are primarily subpleural in distribution and may represent subpleural lymph nodes.  Upper abdomen: Normal imaged portions of the liver, spleen, stomach, pancreas, gallbladder, left kidney. An interpolar right renal lesion is incompletely imaged but likely a cyst. Left adrenal thickening without dominant nodule. Normal imaged right adrenal gland.  Musculoskeletal: No acute osseous abnormality.  IMPRESSION: 1. Posterior right upper lobe lung nodule, most consistent with primary bronchogenic carcinoma. 2. Mild mediastinal adenopathy, suspicious for nodal metastasis. 3. Other pulmonary nodules which are indeterminate. 4. Soft tissue  fullness within the left chest is likely related to marked left-sided gynecomastia. Consider physical exam correlation, given asymmetry, to exclude male breast cancer. 5. Mild centrilobular emphysema.   Electronically Signed  By: Abigail Miyamoto M.D.  On: 05/02/2015 12:22  CLINICAL DATA: Initial treatment strategy for right upper lobe pulmonary nodule.  EXAM: NUCLEAR MEDICINE PET SKULL BASE TO THIGH  TECHNIQUE: 9.2 mCi F-18 FDG was injected intravenously. Full-ring PET imaging was performed from the skull base to thigh after the radiotracer. CT data was obtained and used for attenuation correction and anatomic localization.  FASTING BLOOD GLUCOSE: Value: 115 mg/dl  COMPARISON: Chest CT on 05/02/2015  FINDINGS: NECK  No hypermetabolic lymph nodes in the neck.  CHEST  2.8 cm posterior right upper lobe pulmonary nodule is hypermetabolic, with SUV max of 12.3. Other tiny sub-cm bilateral upper lobe pulmonary nodules show absence of metabolic activity but are too small to characterize.  11 mm right paratracheal mediastinal lymph node is hypermetabolic, with SUV max of 9.5. Asymmetric hypermetabolic activity also noted in the right hilum with SUV max of 4.6.  Asymmetric ill-defined soft tissue density is seen in the central right breast which shows mild hypermetabolic activity, with SUV max of 3.3. This may represent asymmetric gynecomastia, however primary breast carcinoma cannot be excluded. No hypermetabolic axillary lymph nodes identified.  ABDOMEN/PELVIS  No abnormal hypermetabolic activity within the liver, pancreas, adrenal glands, or spleen. No hypermetabolic lymph nodes in the abdomen or pelvis.  Mildly enlarged prostate noted as well as diffuse bladder wall thickening, consistent with chronic bladder outlet obstruction  SKELETON  No focal hypermetabolic activity to suggest skeletal metastasis.  IMPRESSION: 2.8 cm hypermetabolic  posterior right upper lobe pulmonary nodule, consistent with primary bronchogenic carcinoma.  Mild hypermetabolic right hilar and right paratracheal lymphadenopathy, suspicious for metastatic disease.  Bilateral sub-cm indeterminate pulmonary nodules show absence of metabolic activity. Recommend attention on follow-up CT.  Asymmetric left breast soft tissue density with low-grade metabolic activity. Differential diagnosis includes asymmetric left-sided gynecomastia and breast carcinoma. Recommend correlation with physical exam findings, and consider diagnostic mammography/breast imaging for further evaluation.  No evidence of metastatic disease within the neck, abdomen, or pelvis.   Electronically Signed  By: Earle Gell M.D.  On: 05/19/2015 08:57   Impression:  He has a 2.8 cm hypermetabolic posterior RUL pulmonary nodule with a hypermetabolic right lower paratracheal lymph node and mild right hilar hypermetabolism. This is suspicious for bronchogenic carcinoma with mediastinal and hilar lymph node metastases. There is also an ill-defined sub-solid lesion in the LUL that is negative on PET scan but it is only 57m. I think the best option is to do bronchoscopy and mediastinoscopy to try to determine if the hypermetabolic mediastinal node is positive. This  will hopefully give [place] a tissue diagnosis and if positive will send [place] down the non-surgical treatment pathway. I discussed the operative procedure with the patient and his wife including alternatives, benefits and risks; including but not limited to bleeding,  infection, injury to mediastinal structures, pneumothorax, and false negative results.  [name] understands and agrees to proceed.   Plan:  Bronchoscopy and mediastinoscopy on Tuesday 05/31/2015  Gaye Pollack, MD [facility] [phone]

## 2015-05-30 ENCOUNTER — Encounter (HOSPITAL_COMMUNITY): Payer: Self-pay | Admitting: *Deleted

## 2015-05-30 MED ORDER — CHLORHEXIDINE GLUCONATE CLOTH 2 % EX PADS: 6.0000 | MEDICATED_PAD | Freq: Once | CUTANEOUS | Status: DC

## 2015-05-30 MED ORDER — DEXTROSE 5 % IV SOLN
1.5000 g | INTRAVENOUS | Status: AC
  Administered 2015-05-31: 1.5 g via INTRAVENOUS
  Filled 2015-05-30 (×2): qty 1.5

## 2015-05-30 MED ORDER — MUPIROCIN 2 % EX OINT
1.0000 "application " | TOPICAL_OINTMENT | Freq: Once | CUTANEOUS | Status: AC
  Administered 2015-05-31: 1 via TOPICAL
  Filled 2015-05-30: qty 22

## 2015-05-30 NOTE — Progress Notes (Signed)
Pt denies cardiac history, chest pain or sob. 

## 2015-05-31 ENCOUNTER — Encounter (HOSPITAL_COMMUNITY): Payer: Self-pay | Admitting: Anesthesiology

## 2015-05-31 ENCOUNTER — Ambulatory Visit (HOSPITAL_COMMUNITY): Payer: Medicare HMO

## 2015-05-31 ENCOUNTER — Encounter (HOSPITAL_COMMUNITY)
Admission: RE | Disposition: A | Discharge status: Home / Self Care | Payer: Self-pay | Source: Ambulatory Visit | Attending: Surgery

## 2015-05-31 ENCOUNTER — Ambulatory Visit (HOSPITAL_COMMUNITY)
Admission: RE | Admit: 2015-05-31 | Discharge: 2015-05-31 | Disposition: A | Discharge status: Home / Self Care | Payer: Medicare HMO | Source: Ambulatory Visit | Attending: Surgery | Admitting: Surgery

## 2015-05-31 ENCOUNTER — Ambulatory Visit (HOSPITAL_COMMUNITY): Payer: Medicare HMO | Admitting: Anesthesiology

## 2015-05-31 DIAGNOSIS — G4730 Sleep apnea, unspecified: Secondary | ICD-10-CM | POA: Insufficient documentation

## 2015-05-31 DIAGNOSIS — K219 Gastro-esophageal reflux disease without esophagitis: Secondary | ICD-10-CM | POA: Insufficient documentation

## 2015-05-31 DIAGNOSIS — E039 Hypothyroidism, unspecified: Secondary | ICD-10-CM | POA: Insufficient documentation

## 2015-05-31 DIAGNOSIS — C7801 Secondary malignant neoplasm of right lung: Secondary | ICD-10-CM | POA: Insufficient documentation

## 2015-05-31 DIAGNOSIS — I10 Essential (primary) hypertension: Secondary | ICD-10-CM | POA: Insufficient documentation

## 2015-05-31 DIAGNOSIS — Z87891 Personal history of nicotine dependence: Secondary | ICD-10-CM | POA: Insufficient documentation

## 2015-05-31 DIAGNOSIS — Z7982 Long term (current) use of aspirin: Secondary | ICD-10-CM | POA: Insufficient documentation

## 2015-05-31 DIAGNOSIS — Z79899 Other long term (current) drug therapy: Secondary | ICD-10-CM | POA: Diagnosis not present

## 2015-05-31 DIAGNOSIS — Z8546 Personal history of malignant neoplasm of prostate: Secondary | ICD-10-CM | POA: Insufficient documentation

## 2015-05-31 DIAGNOSIS — J939 Pneumothorax, unspecified: Secondary | ICD-10-CM

## 2015-05-31 DIAGNOSIS — R590 Localized enlarged lymph nodes: Secondary | ICD-10-CM | POA: Diagnosis not present

## 2015-05-31 DIAGNOSIS — R918 Other nonspecific abnormal finding of lung field: Secondary | ICD-10-CM

## 2015-05-31 DIAGNOSIS — N400 Enlarged prostate without lower urinary tract symptoms: Secondary | ICD-10-CM | POA: Diagnosis not present

## 2015-05-31 DIAGNOSIS — R222 Localized swelling, mass and lump, trunk: Secondary | ICD-10-CM | POA: Diagnosis not present

## 2015-05-31 HISTORY — PX: VIDEO BRONCHOSCOPY: SHX5072

## 2015-05-31 HISTORY — PX: MEDIASTINOSCOPY: SHX5086

## 2015-05-31 HISTORY — DX: Unspecified glaucoma: H40.9

## 2015-05-31 HISTORY — DX: Malignant (primary) neoplasm, unspecified: C80.1

## 2015-05-31 HISTORY — DX: Hypothyroidism, unspecified: E03.9

## 2015-05-31 LAB — PROTIME-INR
INR: 1.02 (ref 0.00–1.49)
Prothrombin Time: 13.6 seconds (ref 11.6–15.2)

## 2015-05-31 LAB — COMPREHENSIVE METABOLIC PANEL
ALBUMIN: 3.5 g/dL (ref 3.5–5.0)
ALK PHOS: 42 U/L (ref 38–126)
ALT: 17 U/L (ref 17–63)
ANION GAP: 7 (ref 5–15)
AST: 15 U/L (ref 15–41)
BUN: 8 mg/dL (ref 6–20)
CALCIUM: 9.1 mg/dL (ref 8.9–10.3)
CO2: 25 mmol/L (ref 22–32)
Chloride: 110 mmol/L (ref 101–111)
Creatinine, Ser: 1.03 mg/dL (ref 0.61–1.24)
GFR calc Af Amer: >60 mL/min (ref >60)
GFR calc non Af Amer: >60 mL/min (ref >60)
GLUCOSE: 109 mg/dL — AB (ref 65–99)
Potassium: 3.7 mmol/L (ref 3.5–5.1)
SODIUM: 142 mmol/L (ref 135–145)
Total Bilirubin: 0.4 mg/dL (ref 0.3–1.2)
Total Protein: 6.3 g/dL — ABNORMAL LOW (ref 6.5–8.1)

## 2015-05-31 LAB — SURGICAL PCR SCREEN
MRSA, PCR: NEGATIVE
STAPHYLOCOCCUS AUREUS: NEGATIVE

## 2015-05-31 LAB — CBC
HCT: 43 % (ref 39.0–52.0)
HEMOGLOBIN: 14.2 g/dL (ref 13.0–17.0)
MCH: 29.1 pg (ref 26.0–34.0)
MCHC: 33 g/dL (ref 30.0–36.0)
MCV: 88.1 fL (ref 78.0–100.0)
Platelets: 218 10*3/uL (ref 150–400)
RBC: 4.88 MIL/uL (ref 4.22–5.81)
RDW: 14.7 % (ref 11.5–15.5)
WBC: 3.9 10*3/uL — AB (ref 4.0–10.5)

## 2015-05-31 LAB — ABO/RH: ABO/RH(D): A POS

## 2015-05-31 LAB — APTT: APTT: 29 s (ref 24–37)

## 2015-05-31 LAB — TYPE AND SCREEN
ABO/RH(D): A POS
Antibody Screen: NEGATIVE

## 2015-05-31 SURGERY — BRONCHOSCOPY, VIDEO-ASSISTED
Anesthesia: General

## 2015-05-31 MED ORDER — FENTANYL CITRATE (PF) 100 MCG/2ML IJ SOLN: 25.0000 ug | INTRAMUSCULAR | Status: DC | PRN

## 2015-05-31 MED ORDER — LIDOCAINE HCL (CARDIAC) 20 MG/ML IV SOLN
INTRAVENOUS | Status: DC | PRN
  Administered 2015-05-31: 30 mg via INTRAVENOUS

## 2015-05-31 MED ORDER — FENTANYL CITRATE (PF) 250 MCG/5ML IJ SOLN
INTRAMUSCULAR | Status: AC
  Filled 2015-05-31: qty 5

## 2015-05-31 MED ORDER — LIDOCAINE HCL (PF) 1 % IJ SOLN
INTRAMUSCULAR | Status: AC
  Filled 2015-05-31: qty 30

## 2015-05-31 MED ORDER — TRAMADOL HCL 50 MG PO TABS: 50.0000 mg | ORAL_TABLET | Freq: Four times a day (QID) | ORAL | Status: DC | PRN

## 2015-05-31 MED ORDER — PROPOFOL 10 MG/ML IV BOLUS
INTRAVENOUS | Status: AC
  Filled 2015-05-31: qty 20

## 2015-05-31 MED ORDER — SUGAMMADEX SODIUM 200 MG/2ML IV SOLN
INTRAVENOUS | Status: AC
  Filled 2015-05-31: qty 2

## 2015-05-31 MED ORDER — LIDOCAINE HCL 2 % IJ SOLN
INTRAMUSCULAR | Status: AC
  Filled 2015-05-31: qty 20

## 2015-05-31 MED ORDER — LACTATED RINGERS IV SOLN
INTRAVENOUS | Status: DC | PRN
  Administered 2015-05-31 (×2): via INTRAVENOUS

## 2015-05-31 MED ORDER — SUGAMMADEX SODIUM 200 MG/2ML IV SOLN
INTRAVENOUS | Status: DC | PRN
  Administered 2015-05-31: 200 mg via INTRAVENOUS

## 2015-05-31 MED ORDER — PHENYLEPHRINE HCL 10 MG/ML IJ SOLN
INTRAMUSCULAR | Status: DC | PRN
  Administered 2015-05-31: 120 ug via INTRAVENOUS
  Administered 2015-05-31: 80 ug via INTRAVENOUS
  Administered 2015-05-31: 120 ug via INTRAVENOUS

## 2015-05-31 MED ORDER — ROCURONIUM BROMIDE 100 MG/10ML IV SOLN
INTRAVENOUS | Status: DC | PRN
  Administered 2015-05-31: 40 mg via INTRAVENOUS

## 2015-05-31 MED ORDER — MIDAZOLAM HCL 2 MG/2ML IJ SOLN
INTRAMUSCULAR | Status: AC
  Filled 2015-05-31: qty 2

## 2015-05-31 MED ORDER — FENTANYL CITRATE (PF) 100 MCG/2ML IJ SOLN
INTRAMUSCULAR | Status: DC | PRN
  Administered 2015-05-31: 150 ug via INTRAVENOUS
  Administered 2015-05-31: 50 ug via INTRAVENOUS

## 2015-05-31 MED ORDER — ONDANSETRON HCL 4 MG/2ML IJ SOLN: 4.0000 mg | Freq: Once | INTRAMUSCULAR | Status: DC | PRN

## 2015-05-31 MED ORDER — SODIUM CHLORIDE 0.9 % IV SOLN
10.0000 mg | INTRAVENOUS | Status: DC | PRN
  Administered 2015-05-31: 25 ug/min via INTRAVENOUS

## 2015-05-31 MED ORDER — PROPOFOL 10 MG/ML IV BOLUS
INTRAVENOUS | Status: DC | PRN
  Administered 2015-05-31: 150 mg via INTRAVENOUS

## 2015-05-31 MED ORDER — MIDAZOLAM HCL 5 MG/5ML IJ SOLN
INTRAMUSCULAR | Status: DC | PRN
Start: 2015-05-31 — End: 2015-05-31
  Administered 2015-05-31: 2 mg via INTRAVENOUS

## 2015-05-31 MED ORDER — 0.9 % SODIUM CHLORIDE (POUR BTL) OPTIME
TOPICAL | Status: DC | PRN
  Administered 2015-05-31: 1000 mL

## 2015-05-31 SURGICAL SUPPLY — 53 items
BLADE SURG 15 STRL LF DISP TIS (BLADE) ×1 IMPLANT
BLADE SURG 15 STRL SS (BLADE) ×2
BRUSH CYTOL CELLEBRITY 1.5X140 (MISCELLANEOUS) IMPLANT
CANISTER SUCTION 2500CC (MISCELLANEOUS) ×3 IMPLANT
CLIP TI MEDIUM 6 (CLIP) ×3 IMPLANT
CONT SPEC 4OZ CLIKSEAL STRL BL (MISCELLANEOUS) ×9 IMPLANT
COTTONBALL LRG STERILE PKG (GAUZE/BANDAGES/DRESSINGS) IMPLANT
COVER SURGICAL LIGHT HANDLE (MISCELLANEOUS) ×6 IMPLANT
COVER TABLE BACK 60X90 (DRAPES) ×3 IMPLANT
DERMABOND ADVANCED (GAUZE/BANDAGES/DRESSINGS) ×2
DERMABOND ADVANCED .7 DNX12 (GAUZE/BANDAGES/DRESSINGS) ×1 IMPLANT
DRAPE LAPAROTOMY T 102X78X121 (DRAPES) ×3 IMPLANT
ELECT CAUTERY BLADE 6.4 (BLADE) ×3 IMPLANT
ELECT REM PT RETURN 9FT ADLT (ELECTROSURGICAL) ×3
ELECTRODE REM PT RTRN 9FT ADLT (ELECTROSURGICAL) ×1 IMPLANT
FORCEPS BIOP RJ4 1.8 (CUTTING FORCEPS) IMPLANT
GAUZE SPONGE 4X4 12PLY STRL (GAUZE/BANDAGES/DRESSINGS) ×3 IMPLANT
GLOVE EUDERMIC 7 POWDERFREE (GLOVE) ×3 IMPLANT
GOWN STRL REUS W/ TWL LRG LVL3 (GOWN DISPOSABLE) ×1 IMPLANT
GOWN STRL REUS W/ TWL XL LVL3 (GOWN DISPOSABLE) ×1 IMPLANT
GOWN STRL REUS W/TWL LRG LVL3 (GOWN DISPOSABLE) ×2
GOWN STRL REUS W/TWL XL LVL3 (GOWN DISPOSABLE) ×2
HEMOSTAT SURGICEL 2X14 (HEMOSTASIS) IMPLANT
KIT BASIN OR (CUSTOM PROCEDURE TRAY) ×3 IMPLANT
KIT CLEAN ENDO COMPLIANCE (KITS) ×3 IMPLANT
KIT ROOM TURNOVER OR (KITS) ×3 IMPLANT
MARKER SKIN DUAL TIP RULER LAB (MISCELLANEOUS) ×3 IMPLANT
NEEDLE 22X1 1/2 (OR ONLY) (NEEDLE) IMPLANT
NEEDLE BIOPSY TRANSBRONCH 21G (NEEDLE) IMPLANT
NS IRRIG 1000ML POUR BTL (IV SOLUTION) ×3 IMPLANT
OIL SILICONE PENTAX (PARTS (SERVICE/REPAIRS)) ×3 IMPLANT
PACK SURGICAL SETUP 50X90 (CUSTOM PROCEDURE TRAY) ×3 IMPLANT
PAD ARMBOARD 7.5X6 YLW CONV (MISCELLANEOUS) ×6 IMPLANT
PENCIL BUTTON HOLSTER BLD 10FT (ELECTRODE) ×3 IMPLANT
SPONGE INTESTINAL PEANUT (DISPOSABLE) ×3 IMPLANT
SUT SILK 2 0 TIES 10X30 (SUTURE) IMPLANT
SUT VIC AB 2-0 CT1 27 (SUTURE) ×2
SUT VIC AB 2-0 CT1 TAPERPNT 27 (SUTURE) ×1 IMPLANT
SUT VIC AB 3-0 SH 27 (SUTURE)
SUT VIC AB 3-0 SH 27X BRD (SUTURE) IMPLANT
SUT VICRYL 4-0 PS2 18IN ABS (SUTURE) ×3 IMPLANT
SYR 20ML ECCENTRIC (SYRINGE) ×3 IMPLANT
SYR 5ML LUER SLIP (SYRINGE) ×3 IMPLANT
SYR CONTROL 10ML LL (SYRINGE) IMPLANT
SYRINGE 10CC LL (SYRINGE) ×3 IMPLANT
TOWEL OR 17X24 6PK STRL BLUE (TOWEL DISPOSABLE) ×6 IMPLANT
TOWEL OR 17X26 10 PK STRL BLUE (TOWEL DISPOSABLE) ×3 IMPLANT
TRAP SPECIMEN MUCOUS 40CC (MISCELLANEOUS) ×3 IMPLANT
TUBE CONNECTING 12'X1/4 (SUCTIONS) ×1
TUBE CONNECTING 12X1/4 (SUCTIONS) ×2 IMPLANT
TUBE CONNECTING 20'X1/4 (TUBING) ×1
TUBE CONNECTING 20X1/4 (TUBING) ×2 IMPLANT
WATER STERILE IRR 1000ML POUR (IV SOLUTION) ×3 IMPLANT

## 2015-05-31 NOTE — Op Note (Signed)
CARDIOTHORACIC SURGERY OPERATIVE NOTE 05/31/2015 Alan [name] [id number]  Surgeon: Gaye Pollack, MD  First Assistant: none   Preoperative Diagnosis: Right upper lobe lung mass and hypermetabolic mediastinal adenopathy  Postoperative Diagnosis: Same   Procedure:   1. Flexible video bronchoscopy  2.  Mediastinoscopy with lymph node biopsies   Anesthesia: General Endotracheal   Clinical History/Surgical Indication:  The patient is a 74 year old gentleman with a remote history of heavy smoking, prostate cancer being followed by Dr. 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 Lenna Gilford last month. This showed a RUL lung nodule. CT of the chest shows a 2.2 x 2.6 cm spiculated lesion in the posterior RUL with a 6 mm sub-solid nodule in the left apex and bilateral tiny subpleural pulmonary nodules. There is a 1.3 cm right paratracheal to pretracheal lymph node. PET scan shows hypermetabolic activity in the RUL lesion with an SUV of 12.3. The right paratracheal lymph node has an SUV of 9.5. And there is some asymmetric hypermetabolic activity in the right hilum with an SUV of 4.6. The LUL lesion has no hypermetabolic activity. The left breast soft tissue density has mild hypermetabolic activity with an SUV of 3.3. He says that he feels fine.  He has a 2.8 cm hypermetabolic posterior RUL pulmonary nodule with a hypermetabolic right lower paratracheal lymph node and mild right hilar hypermetabolism. This is suspicious for bronchogenic carcinoma with mediastinal and hilar lymph node metastases. There is also an ill-defined sub-solid lesion in the LUL that is negative on PET scan but it is only 27m. I think the best option is to do bronchoscopy and mediastinoscopy to try to determine if the hypermetabolic mediastinal node is positive. This will hopefully give [place] tissue  diagnosis and if positive will [place] the non-surgical treatment pathway. I discussed the operative procedure with the patient and his wife including alternatives, benefits and risks; including but not limited to bleeding, infection, injury to mediastinal structures, pneumothorax, and false negative results. RHenrene Hawkingunderstands and agrees to proceed.    Preparation:  The patient was seen in the preoperative holding area and the correct patient, correct operation were confirmed with the patient after reviewing the medical record and xrays. The consent was signed by me. Preoperative antibiotics were given. A radial arterial line was placed by the anesthesia team. The patient was taken back to the operating room and positioned supine on the operating room table. He was placed under general endotracheal anesthesia by the anesthesia team. Lower extremity SCD's were placed. A time out was taken and the correct patient, operation were confirmed with nursing and anesthesia staff.            Flexible Bronchoscopy:  The video bronchoscope was passed down the endotracheal tube. The distal trachea was normal. The carina was sharp. The left bronchial tree had normal segmental anatomy with no endobronchial lesions or extrinsic compression. The right bronchial tree had normal segmental anatomy with no endobronchial lesions or extrinsic compression.   Mediastinoscopy:  A roll was placed beneath the shoulders and the neck slightly extended. The neck and chest were prepped with betadine soap and solution and draped in the usual sterile manner. A 2nd time out was taken and the correct patient and procedure were confirmed with nursing and anesthesia. A 2 cm incision was made horizontally just above the sternal notch in the  natural skin crease. Electrocautery was used to continue down through the subcutaneous tissue. The strap muscles were separated in the midline to expose the trachea. A  pretracheal plane was developed bluntly and the mediastinoscope inserted. It was advanced along the anterior tracheal wall. There were multiple lymph nodes seen and biopsies were taken at 4 R and 2 R. These were sent for frozen section and Dr. 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  Hemostasis was complete at the end of the procedure. The scope was removed and the strap muscles re-approximated with interrupted 3-0 vicryl sutures. The platysma muscle was re-approximated with 3-0 vicryl suture and the skin with 4-0 vicryl subcuticular suture. Dermabond was applied. The sponge, needle, and instrument counts were correct according to the nurses. The patient was awakened, extubated and transported to the PACU in stable condition.

## 2015-05-31 NOTE — Interval H&P Note (Signed)
History and Physical Interval Note:  05/31/2015 7:04 AM  Alan [name]  has presented today for surgery, with the diagnosis of RUL LUNG MASS WITH MEDIASTINAL ADENOPATHY  The various methods of treatment have been discussed with the patient and family. After consideration of risks, benefits and other options for treatment, the patient has consented to  Procedure(s): VIDEO BRONCHOSCOPY (N/A) MEDIASTINOSCOPY (N/A) as a surgical intervention .  The patient's history has been reviewed, patient examined, no change in status, stable for surgery.  I have reviewed the patient's chart and labs.  Questions were answered to the patient's satisfaction.     Gaye Pollack

## 2015-05-31 NOTE — Brief Op Note (Signed)
05/31/2015  9:23 AM  PATIENT:  Alan [name]  74 y.o. male  PRE-OPERATIVE DIAGNOSIS:  RUL LUNG MASS WITH MEDIASTINAL ADENOPATHY  POST-OPERATIVE DIAGNOSIS:  RUL LUNG MASS WITH MEDIASTINAL ADENOPATHY  PROCEDURE:  Procedure(s): VIDEO BRONCHOSCOPY (N/A) MEDIASTINOSCOPY (N/A)  SURGEON:  Surgeon(s) and Role:    * Gaye Pollack, MD - Primary  PHYSICIAN ASSISTANT: none  ASSISTANTS: none   ANESTHESIA:   general  EBL:  Total I/O In: 9233 [I.V.:1325; IV Piggyback:50] Out: 50 [Blood:50]  BLOOD ADMINISTERED:none  DRAINS: none   LOCAL MEDICATIONS USED:  NONE  SPECIMEN:  Source of Specimen:  R 4 and R 2 lymph nodes  DISPOSITION OF SPECIMEN:  PATHOLOGY  Frozen section of both lymph nodes shows metastatic non-small cell carcinoma, favoring adenocarcinoma.  COUNTS:  YES  TOURNIQUET:  * No tourniquets in log *  DICTATION: .Note written in [place]: Discharge to home after PACU  PATIENT DISPOSITION:  PACU - hemodynamically stable.   Delay start of Pharmacological VTE agent (>24hrs) due to surgical blood loss or risk of bleeding: not applicable

## 2015-05-31 NOTE — Anesthesia Procedure Notes (Signed)
Procedure Name: Intubation Date/Time: 05/31/2015 7:40 AM Performed by: Neldon Newport Pre-anesthesia Checklist: Patient being monitored, Suction available, Emergency Drugs available, Patient identified and Timeout performed Patient Re-evaluated:Patient Re-evaluated prior to inductionOxygen Delivery Method: Circle system utilized Preoxygenation: Pre-oxygenation with 100% oxygen Intubation Type: IV induction Ventilation: Mask ventilation without difficulty Laryngoscope Size: Mac, 2, Miller, 3 and Glidescope Grade View: Grade III Tube type: Oral Tube size: 8.5 mm Number of attempts: 3 Intubation method: DL by myself and Dr. Linna Caprice then intubation per Dr. Linna Caprice using glide scope.  Prominant upper teeth and small chin  Placement Confirmation: ETT inserted through vocal cords under direct vision,  breath sounds checked- equal and bilateral and positive ETCO2 Secured at: 22 cm Tube secured with: Tape Dental Injury: Teeth and Oropharynx as per pre-operative assessment

## 2015-05-31 NOTE — Transfer of Care (Signed)
Immediate Anesthesia Transfer of Care Note  Patient: Alan [name]  Procedure(s) Performed: Procedure(s): VIDEO BRONCHOSCOPY (N/A) MEDIASTINOSCOPY (N/A)  Patient Location: PACU  Anesthesia Type:General  Level of Consciousness: awake, alert  and oriented  Airway & Oxygen Therapy: Patient Spontanous Breathing and Patient connected to nasal cannula oxygen  Post-op Assessment: Report given to RN, Post -op Vital signs reviewed and stable and Patient moving all extremities X 4  Post vital signs: Reviewed and stable  Last Vitals:  Filed Vitals:   05/31/15 0618  BP: 145/76  Pulse: 70  Temp: 36.5 C  Resp: 18    Complications: No apparent anesthesia complications

## 2015-05-31 NOTE — Discharge Instructions (Signed)
You may shower. The incision is covered with Dermabond surgical adhesive that is water-proof. Do not apply creams or lotions to the incision.  Resume normal activity tomorrow.

## 2015-05-31 NOTE — Anesthesia Preprocedure Evaluation (Addendum)
Anesthesia Evaluation  Patient identified by MRN, date of birth, ID band Patient awake    Reviewed: Allergy & Precautions, H&P , NPO status , Patient's Chart, lab work & pertinent test results  Airway Mallampati: III  TM Distance: >3 FB Neck ROM: full  Mouth opening: Limited Mouth Opening  Dental  (+) Teeth Intact, Dental Advidsory Given   Pulmonary sleep apnea , former smoker,    breath sounds clear to auscultation       Cardiovascular hypertension,  Rhythm:regular Rate:Normal     Neuro/Psych    GI/Hepatic GERD  Medicated and Poorly Controlled,  Endo/Other    Renal/GU      Musculoskeletal   Abdominal   Peds  Hematology   Anesthesia Other Findings   Reproductive/Obstetrics                            Anesthesia Physical Anesthesia Plan  ASA: III  Anesthesia Plan: General   Post-op Pain Management:    Induction: Intravenous  Airway Management Planned: Oral ETT  Additional Equipment:   Intra-op Plan:   Post-operative Plan: Extubation in OR  Informed Consent: I have reviewed the patients History and Physical, chart, labs and discussed the procedure including the risks, benefits and alternatives for the proposed anesthesia with the patient or authorized representative who has indicated his/her understanding and acceptance.   Dental Advisory Given  Plan Discussed with: Anesthesiologist, CRNA and Surgeon  Anesthesia Plan Comments:        Anesthesia Quick Evaluation

## 2015-05-31 NOTE — H&P (Signed)
[facility] [address]       [place] [address]             [phone]      Cardiothoracic Surgery History and Physical   PCP is [name], MD Referring Provider is [name], MD  Chief Complaint  Patient presents with  . Lung Lesion    surgical eval on right lung nodule, PET Scan 05/19/15, Chest CT 05/02/15    HPI:  The patient is a 74 year old gentleman with a remote history of heavy smoking, prostate cancer being followed by Dr. 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 Lenna Gilford last month. This showed a RUL lung nodule. CT of the chest shows a 2.2 x 2.6 cm spiculated lesion in the posterior RUL with a 6 mm sub-solid nodule in the left apex and bilateral tiny subpleural pulmonary nodules. There is a 1.3 cm right paratracheal to pretracheal lymph node. PET scan shows hypermetabolic activity in the RUL lesion with an SUV of 12.3. The right paratracheal lymph node has an SUV of 9.5. And there is some asymmetric hypermetabolic activity in the right hilum with an SUV of 4.6. The LUL lesion has no hypermetabolic activity. The left breast soft tissue density has mild hypermetabolic activity with an SUV of 3.3. He says that he feels fine.  Past Medical History  Diagnosis Date  . Sleep apnea     does not use cpap  . Unspecified essential hypertension   . Hypercholesteremia   . GERD (gastroesophageal reflux disease)   . Diverticulosis of colon (without mention of hemorrhage)   . Irritable bowel syndrome   . Benign neoplasm of colon     colon polyps  . Benign hypertrophy of prostate   . Erectile dysfunction   . Restless legs syndrome (RLS)   . Anxiety state, unspecified   . Onychomycosis   . Constipation, chronic     Past Surgical History  Procedure Laterality Date  .  None    . Colonoscopy    . Laparoscopic appendectomy N/A 01/27/2013    Procedure: APPENDECTOMY LAPAROSCOPIC; Surgeon: Edward Jolly, MD; Location: [facility]; Service: General; Laterality: N/A;  . Appendectomy  12/2012    Family History  Problem Relation Age of Onset  . Colon cancer Paternal Grandfather 67  . Cancer Paternal Grandfather     prostate  . Cancer Father     prostate  . Cancer Brother     prostate    Social History Social History  Substance Use Topics  . Smoking status: Former Smoker -- 2.00 packs/day for 15 years    Types: Cigarettes    Quit date: 07/02/1968  . Smokeless tobacco: Never Used  . Alcohol Use: No    Current Outpatient Prescriptions  Medication Sig Dispense Refill  . amLODipine (NORVASC) 5 MG tablet TAKE ONE TABLET BY MOUTH IN THE MORNING 90 tablet 0  . aspirin EC 81 MG tablet Take 81 mg by mouth every morning.     . brimonidine-timolol (COMBIGAN) 0.2-0.5 % ophthalmic solution Place 1 drop into both eyes every 12 (twelve) hours.    . fluticasone (FLONASE) 50 MCG/ACT nasal spray Place 2 sprays into both nostrils daily. 48 g 3  . lactulose (CHRONULAC) 10 GM/15ML solution Take 15 mLs by mouth 2 (two) times daily as needed (bowel movement).    [name] levothyroxine (  SYNTHROID, LEVOTHROID) 88 MCG tablet Take 1 tablet (88 mcg total) by mouth daily. 45 tablet 2  . Linaclotide (LINZESS) 290 MCG CAPS capsule Take 1 capsule (290 mcg total) by mouth daily. 30 capsule prn  . omeprazole (PRILOSEC) 20 MG capsule Take 1 capsule (20 mg total) by mouth every morning. 90 capsule 3  . polyethylene glycol (MIRALAX / GLYCOLAX) packet Take 17 g by mouth every morning.     . sildenafil (VIAGRA) 100 MG tablet Take 1 tablet (100 mg total) by mouth as directed. 10 tablet 0  . simvastatin (ZOCOR) 20 MG tablet TAKE ONE TABLET BY MOUTH AT BEDTIME. 90  tablet 0  . tamsulosin (FLOMAX) 0.4 MG CAPS capsule Take 1 capsule (0.4 mg total) by mouth every evening. (Patient taking differently: Take 0.8 mg by mouth every evening. ) 90 capsule 3   No current facility-administered medications for this visit.    Allergies  Allergen Reactions  . Codeine Other (See Comments)    REACTION: hallucinations,faint,dizziness    Review of Systems  Constitutional: Positive for fatigue and unexpected weight change. Negative for fever, chills, activity change and appetite change.  HENT: Negative.  Eyes: Negative.  Respiratory: Positive for cough. Negative for chest tightness and shortness of breath.  Cardiovascular: Negative for chest pain, palpitations and leg swelling.  Gastrointestinal: Positive for constipation.   Reflux  Endocrine: Negative.  Genitourinary: Positive for frequency.   BPH and prostate cancer  Musculoskeletal: Negative.  Skin: Negative.  Allergic/Immunologic: Negative.  Neurological: Negative.  Hematological: Negative.  Psychiatric/Behavioral: Negative.    BP 157/88 mmHg  Pulse 96  Resp 20  Ht '5\' 6"'$  (1.676 m)  Wt 170 lb (77.111 kg)  BMI 27.45 kg/m2  SpO2 98% Physical Exam  Constitutional: He is oriented to person, place, and time. He appears well-developed and well-nourished. No distress.  HENT:  Head: Normocephalic and atraumatic.  Mouth/Throat: Oropharynx is clear and moist.  Eyes: EOM are normal. Pupils are equal, round, and reactive to light.  Neck: Normal range of motion. Neck supple. No JVD present. No tracheal deviation present. No thyromegaly present.  Cardiovascular: Normal rate, regular rhythm, normal heart sounds and intact distal pulses.  No murmur heard. Pulmonary/Chest: Effort normal and breath sounds normal. No respiratory distress. He has no wheezes. He has no rales.  Abdominal: Soft. Bowel sounds are normal. He exhibits no distension and no mass. There is no  tenderness.  Musculoskeletal: Normal range of motion. He exhibits no edema.  Lymphadenopathy:   He has no cervical adenopathy.  Neurological: He is alert and oriented to person, place, and time. No cranial nerve deficit.  Skin: Skin is warm and dry.  Psychiatric: He has a normal mood and affect.     Diagnostic Tests:  CLINICAL DATA: Right upper lobe lung nodule on routine chest x-ray. Prostate cancer diagnosed 1 year ago. Ex smoker, quitting 46 years ago.  EXAM: CT CHEST WITHOUT CONTRAST  TECHNIQUE: Multidetector CT imaging of the chest was performed following the standard protocol without IV contrast.  COMPARISON: Chest radiograph of 04/21/2015.  FINDINGS: Mediastinum/Nodes: No supraclavicular adenopathy. Marked probable left-sided gynecomastia on image/series 30/2. No axillary adenopathy. Aortic and branch vessel atherosclerosis. Normal heart size, without pericardial effusion. Low right paratracheal/precarinal node is mildly enlarged at 1.3 cm on image/series 22/2.  Hilar regions poorly evaluated without intravenous contrast.  Lungs/Pleura: No pleural fluid. Mild centrilobular emphysema. Corresponding to the plain film abnormality, a spiculated posterior right upper lobe pulmonary nodule measures 2.2 x 2.6 cm on  image/series 14/3. 2.2 cm craniocaudal on image/series 51/603.  Anterior medial right upper lobe pulmonary nodule measures 6 mm on image/series 29/3  Subpleural right lower lobe 4 mm nodule on image 34.  Probable sub solid pulmonary nodule in the left apex at 6 mm on image 9.  Other smaller pulmonary nodules which are primarily subpleural in distribution and may represent subpleural lymph nodes.  Upper abdomen: Normal imaged portions of the liver, spleen, stomach, pancreas, gallbladder, left kidney. An interpolar right renal lesion is incompletely imaged but likely a cyst. Left adrenal thickening without dominant nodule. Normal imaged  right adrenal gland.  Musculoskeletal: No acute osseous abnormality.  IMPRESSION: 1. Posterior right upper lobe lung nodule, most consistent with primary bronchogenic carcinoma. 2. Mild mediastinal adenopathy, suspicious for nodal metastasis. 3. Other pulmonary nodules which are indeterminate. 4. Soft tissue fullness within the left chest is likely related to marked left-sided gynecomastia. Consider physical exam correlation, given asymmetry, to exclude male breast cancer. 5. Mild centrilobular emphysema.   Electronically Signed  By: Abigail Miyamoto M.D.  On: 05/02/2015 12:22  CLINICAL DATA: Initial treatment strategy for right upper lobe pulmonary nodule.  EXAM: NUCLEAR MEDICINE PET SKULL BASE TO THIGH  TECHNIQUE: 9.2 mCi F-18 FDG was injected intravenously. Full-ring PET imaging was performed from the skull base to thigh after the radiotracer. CT data was obtained and used for attenuation correction and anatomic localization.  FASTING BLOOD GLUCOSE: Value: 115 mg/dl  COMPARISON: Chest CT on 05/02/2015  FINDINGS: NECK  No hypermetabolic lymph nodes in the neck.  CHEST  2.8 cm posterior right upper lobe pulmonary nodule is hypermetabolic, with SUV max of 12.3. Other tiny sub-cm bilateral upper lobe pulmonary nodules show absence of metabolic activity but are too small to characterize.  11 mm right paratracheal mediastinal lymph node is hypermetabolic, with SUV max of 9.5. Asymmetric hypermetabolic activity also noted in the right hilum with SUV max of 4.6.  Asymmetric ill-defined soft tissue density is seen in the central right breast which shows mild hypermetabolic activity, with SUV max of 3.3. This may represent asymmetric gynecomastia, however primary breast carcinoma cannot be excluded. No hypermetabolic axillary lymph nodes identified.  ABDOMEN/PELVIS  No abnormal hypermetabolic activity within the liver, pancreas, adrenal glands, or  spleen. No hypermetabolic lymph nodes in the abdomen or pelvis.  Mildly enlarged prostate noted as well as diffuse bladder wall thickening, consistent with chronic bladder outlet obstruction  SKELETON  No focal hypermetabolic activity to suggest skeletal metastasis.  IMPRESSION: 2.8 cm hypermetabolic posterior right upper lobe pulmonary nodule, consistent with primary bronchogenic carcinoma.  Mild hypermetabolic right hilar and right paratracheal lymphadenopathy, suspicious for metastatic disease.  Bilateral sub-cm indeterminate pulmonary nodules show absence of metabolic activity. Recommend attention on follow-up CT.  Asymmetric left breast soft tissue density with low-grade metabolic activity. Differential diagnosis includes asymmetric left-sided gynecomastia and breast carcinoma. Recommend correlation with physical exam findings, and consider diagnostic mammography/breast imaging for further evaluation.  No evidence of metastatic disease within the neck, abdomen, or pelvis.   Electronically Signed  By: Earle Gell M.D.  On: 05/19/2015 08:57   Impression:  He has a 2.8 cm hypermetabolic posterior RUL pulmonary nodule with a hypermetabolic right lower paratracheal lymph node and mild right hilar hypermetabolism. This is suspicious for bronchogenic carcinoma with mediastinal and hilar lymph node metastases. There is also an ill-defined sub-solid lesion in the LUL that is negative on PET scan but it is only 90m. I think the best option is to do bronchoscopy and  mediastinoscopy to try to determine if the hypermetabolic mediastinal node is positive. This will hopefully give [place] a tissue diagnosis and if positive will send [place] down the non-surgical treatment pathway. I discussed the operative procedure with the patient and his wife including alternatives, benefits and risks; including but not limited to bleeding, infection, injury to mediastinal structures, pneumothorax,  and false negative results. [name] understands and agrees to proceed.   Plan:  Bronchoscopy and mediastinoscopy on Tuesday 05/31/2015  Gaye Pollack, MD [facility] [phone]

## 2015-05-31 NOTE — Progress Notes (Signed)
Dr Cyndia Bent at bedside to discuss findings and follow up withpt

## 2015-05-31 NOTE — Anesthesia Postprocedure Evaluation (Signed)
Anesthesia Post Note  Patient: Alan [name]  Procedure(s) Performed: Procedure(s) (LRB): VIDEO BRONCHOSCOPY (N/A) MEDIASTINOSCOPY (N/A)  Patient location during evaluation: PACU Anesthesia Type: General Level of consciousness: awake and awake and alert Pain management: pain level controlled Vital Signs Assessment: post-procedure vital signs reviewed and stable Cardiovascular status: stable Anesthetic complications: no    Last Vitals:  Filed Vitals:   05/31/15 1008 05/31/15 1009  BP:  131/92  Pulse:  89  Temp: 36.2 C   Resp:      Last Pain: There were no vitals filed for this visit.               Jeneva Schweizer COKER

## 2015-06-01 ENCOUNTER — Encounter (HOSPITAL_COMMUNITY): Payer: Self-pay | Admitting: Surgery

## 2015-06-01 ENCOUNTER — Telehealth: Payer: Self-pay | Admitting: *Deleted

## 2015-06-01 DIAGNOSIS — R918 Other nonspecific abnormal finding of lung field: Secondary | ICD-10-CM

## 2015-06-01 NOTE — Telephone Encounter (Signed)
Oncology Nurse Navigator Documentation  Oncology Nurse Navigator Flowsheets 06/01/2015  Referral date to RadOnc/MedOnc 05/31/2015  Navigator Encounter Type Introductory phone call;Telephone/I received a referral on [name] yesterday.  I called today to schedule him for [place] 0n 06/09/15 arrive at 1:30.  He verbalized understanding of appt time and place  Patient Visit Type Initial  Treatment Phase Abnormal Scans  Interventions Coordination of Care  Coordination of Care MD Appointments  Time Spent with Patient 15

## 2015-06-02 ENCOUNTER — Encounter (HOSPITAL_COMMUNITY): Payer: Self-pay | Admitting: Surgery

## 2015-06-02 ENCOUNTER — Telehealth: Payer: Self-pay | Admitting: *Deleted

## 2015-06-02 NOTE — Telephone Encounter (Signed)
Oncology Nurse Navigator Documentation  Oncology Nurse Navigator Flowsheets 06/02/2015  Navigator Encounter Type Telephone/I spoke with Dr. [name] about patient's PET scan.  Dr. [name] requested me to scheduled patient to be seen tomorrow.  I called patient and scheduled.   Patient Visit Type Follow-up  Treatment Phase Abnormal Scans  Interventions Coordination of Care  Coordination of Care MD Appointments  Time Spent with Patient 15

## 2015-06-03 ENCOUNTER — Telehealth: Payer: Self-pay | Admitting: *Deleted

## 2015-06-03 ENCOUNTER — Ambulatory Visit (HOSPITAL_BASED_OUTPATIENT_CLINIC_OR_DEPARTMENT_OTHER): Payer: Medicare HMO | Admitting: Internal Medicine

## 2015-06-03 ENCOUNTER — Telehealth: Payer: Self-pay | Admitting: Internal Medicine

## 2015-06-03 VITALS — BP 144/77 | HR 101 | Temp 97.6°F | Resp 18 | Ht 66.0 in | Wt 175.1 lb

## 2015-06-03 DIAGNOSIS — C3411 Malignant neoplasm of upper lobe, right bronchus or lung: Secondary | ICD-10-CM

## 2015-06-03 DIAGNOSIS — R911 Solitary pulmonary nodule: Secondary | ICD-10-CM

## 2015-06-03 DIAGNOSIS — N62 Hypertrophy of breast: Secondary | ICD-10-CM | POA: Diagnosis not present

## 2015-06-03 NOTE — Telephone Encounter (Signed)
Per staff message and POF I have scheduled appts. Advised scheduler of appts and to move labs. [name]  

## 2015-06-03 NOTE — Progress Notes (Signed)
[place] [phone]   [phone]  CONSULT NOTE  REFERRING PHYSICIAN: Dr. Gilford Raid  REASON FOR CONSULTATION:  74 years old African-American male recently diagnosed with lung cancer.  HPI Alan [name] is a 74 y.o. male was past medical history significant for hypothyroidism, colon polyp, dyslipidemia, erectile dysfunction, obstructive sleep apnea, hypertension, GERD, prostate cancer currently on observation under the care of Dr. Marti Sleigh by diagnosed in October 2015. The patient also has a history of smoking 2 pack per day for around 20 years but quit in 1973. He was seen recently by his primary care physician for gynecomastia of the left breast that was thought to be secondary to change in his thyroid medications. During his evaluation chest x-ray was performed on 04/21/2015 and it showed right upper lobe infiltrate versus mass lesion new from the prior study of 10/25/2012. This was followed by CT scan of the chest without contrast on 05/02/2015 and it showed a spiculated posterior right upper lobe pulmonary nodule measuring 2.2 x 2.6 cm. There was also low right paratracheal/precarinal node mildly enlarged and measured 1.3 cm. The scan also showed anterior medial right upper lobe pulmonary nodule measuring 0.6 cm and a probable solid pulmonary nodule in the left apex measuring 0.6 cm. There was other smaller pulmonary nodules primarily subpleural in distribution and may represent subpleural lymph nodes. This was followed by a PET scan on 05/19/2015 and it showed 2.8 cm hypermetabolic posterior right upper lobe pulmonary nodule consistent with primary bronchogenic carcinoma. There was mild hypermetabolic right hilar and right paratracheal lymph node suspicious for metastatic disease. The bilateral sub-centimeter indeterminate pulmonary nodules showed absence of metabolic activity. There was also a symmetric left breast soft tissue density with low-grade  metabolic activity questionable for a symmetric left sided gynecomastia and breast carcinoma. The patient was seen by Dr. Cyndia Bent and he underwent flexible video bronchoscopy as well as mediastinoscopy with lymph node biopsies on 05/22/2015. The final pathology of $R and 2R lymph nodes (Accession: [id number]) showed metastatic adenocarcinoma. Dr. Cyndia Bent kindly referred the patient to me today for evaluation and recommendation regarding treatment of his condition. When seen today the patient is feeling fine with no specific complaints except for mild fatigue. He denied having any significant chest pain, shortness of breath, but has mild cough with no hemoptysis. He denied having any significant weight loss or night sweats. He has no headache or visual changes. Family history significant for mother with COPD and several family members including father, brother and paternal grandfather had prostate cancer. The patient is married and has 7 children. He was accompanied by his wife Shirlene. The patient works as a Theme park manager. He has a history of smoking 2 pack per day for around 20 years but quit in 1973. He has no history of alcohol or drug abuse.  HPI  Past Medical History  Diagnosis Date  . Sleep apnea     does not use cpap  . Unspecified essential hypertension   . Hypercholesteremia   . GERD (gastroesophageal reflux disease)   . Diverticulosis of colon (without mention of hemorrhage)   . Irritable bowel syndrome   . Benign neoplasm of colon     colon polyps  . Benign hypertrophy of prostate   . Erectile dysfunction   . Restless legs syndrome (RLS)   . Anxiety state, unspecified   . Onychomycosis   . Constipation, chronic   . Hypothyroidism   . Cancer [facility])     prostate  cancer  . Glaucoma     Past Surgical History  Procedure Laterality Date  . None    . Colonoscopy    . Laparoscopic appendectomy N/A 01/27/2013    Procedure: APPENDECTOMY LAPAROSCOPIC;  Surgeon: Edward Jolly, MD;   Location: [facility];  Service: General;  Laterality: N/A;  . Appendectomy  12/2012  . Thyroidectomy    . Tonsillectomy    . Cardiac catheterization  ? 2008  . Video bronchoscopy N/A 05/31/2015    Procedure: VIDEO BRONCHOSCOPY;  Surgeon: Gaye Pollack, MD;  Location: [place];  Service: Thoracic;  Laterality: N/A;  . Mediastinoscopy N/A 05/31/2015    Procedure: MEDIASTINOSCOPY;  Surgeon: Gaye Pollack, MD;  Location: [facility];  Service: Thoracic;  Laterality: N/A;    Family History  Problem Relation Age of Onset  . Colon cancer Paternal Grandfather 32  . Cancer Paternal Grandfather     prostate  . Cancer Father     prostate  . Cancer Brother     prostate    Social History Social History  Substance Use Topics  . Smoking status: Former Smoker -- 2.00 packs/day for 15 years    Types: Cigarettes    Quit date: 07/02/1968  . Smokeless tobacco: Former Systems developer    Types: Chew, Snuff  . Alcohol Use: No    Allergies  Allergen Reactions  . Codeine Other (See Comments)    : hallucinations,faint,dizziness    Current Outpatient Prescriptions  Medication Sig Dispense Refill  . amLODipine (NORVASC) 5 MG tablet TAKE ONE TABLET BY MOUTH IN THE MORNING 90 tablet 0  . aspirin EC 81 MG tablet Take 81 mg by mouth daily.     . brimonidine-timolol (COMBIGAN) 0.2-0.5 % ophthalmic solution Place 1 drop into both eyes every 12 (twelve) hours.    . finasteride ([name]) 5 MG tablet Take 5 mg by mouth daily.    . fluticasone (FLONASE) 50 MCG/ACT nasal spray Place 2 sprays into both nostrils daily. (Patient taking differently: Place 2 sprays into both nostrils daily as needed for allergies or rhinitis. ) 48 g 3  . lactulose (CHRONULAC) 10 GM/15ML solution Take 10 g by mouth 2 (two) times daily as needed (constipation).     [name] levothyroxine (SYNTHROID, LEVOTHROID) 88 MCG tablet Take 1 tablet (88 mcg total) by mouth daily. (Patient taking differently: Take 88 mcg by mouth daily before breakfast. ) 45 tablet 2  .  Linaclotide (LINZESS) 290 MCG CAPS capsule Take 1 capsule (290 mcg total) by mouth daily. (Patient taking differently: Take 290 mcg by mouth daily as needed (constipation). ) 30 capsule prn  . omeprazole (PRILOSEC) 20 MG capsule Take 1 capsule (20 mg total) by mouth every morning. (Patient taking differently: Take 20 mg by mouth daily before breakfast. ) 90 capsule 3  . polyethylene glycol (MIRALAX / GLYCOLAX) packet Take 17 g by mouth daily as needed (constipation). Mix in 8 oz of liquid and drink    . sildenafil (VIAGRA) 100 MG tablet Take 1 tablet (100 mg total) by mouth as directed. (Patient taking differently: Take 100 mg by mouth daily as needed for erectile dysfunction. ) 10 tablet 0  . simvastatin (ZOCOR) 20 MG tablet TAKE ONE TABLET BY MOUTH AT BEDTIME. 90 tablet 0  . tamsulosin (FLOMAX) 0.4 MG CAPS capsule Take 1 capsule (0.4 mg total) by mouth every evening. (Patient taking differently: Take 0.8 mg by mouth at bedtime. ) 90 capsule 3  . traMADol (ULTRAM) 50 MG tablet Take 1  tablet (50 mg total) by mouth every 6 (six) hours as needed for moderate pain. 20 tablet 0   No current facility-administered medications for this visit.    Review of Systems  Constitutional: positive for fatigue Eyes: negative Ears, nose, mouth, throat, and face: negative Respiratory: positive for cough Cardiovascular: negative Gastrointestinal: negative Genitourinary:negative Integument/breast: negative Hematologic/lymphatic: negative Musculoskeletal:negative Neurological: negative Behavioral/Psych: negative Endocrine: negative Allergic/Immunologic: negative  Physical Exam  XLK:GMWNU, healthy, no distress, well nourished and well developed SKIN: skin color, texture, turgor are normal, no rashes or significant lesions HEAD: Normocephalic, No masses, lesions, tenderness or abnormalities EYES: normal, PERRLA EARS: External ears normal, Canals clear OROPHARYNX:no exudate, no erythema and lips, buccal  mucosa, and tongue normal  NECK: supple, no adenopathy, no JVD LYMPH:  no palpable lymphadenopathy, no hepatosplenomegaly BREAST: Enlargement of the left breast with no palpable masses LUNGS: clear to auscultation , and palpation HEART: regular rate & rhythm, no murmurs and no gallops ABDOMEN:abdomen soft, non-tender, normal bowel sounds and no masses or organomegaly BACK: Back symmetric, no curvature., No CVA tenderness EXTREMITIES:no joint deformities, effusion, or inflammation, no edema, no skin discoloration  NEURO: alert & oriented x 3 with fluent speech, no focal motor/sensory deficits  PERFORMANCE STATUS: ECOG 1  LABORATORY DATA: Lab Results  Component Value Date   WBC 3.9* 05/31/2015   HGB 14.2 05/31/2015   HCT 43.0 05/31/2015   MCV 88.1 05/31/2015   PLT 218 05/31/2015      Chemistry      Component Value Date/Time   NA 142 05/31/2015 0645   K 3.7 05/31/2015 0645   CL 110 05/31/2015 0645   CO2 25 05/31/2015 0645   BUN 8 05/31/2015 0645   CREATININE 1.03 05/31/2015 0645      Component Value Date/Time   CALCIUM 9.1 05/31/2015 0645   ALKPHOS 42 05/31/2015 0645   AST 15 05/31/2015 0645   ALT 17 05/31/2015 0645   BILITOT 0.4 05/31/2015 0645       RADIOGRAPHIC STUDIES: Dg Chest 2 View  05/31/2015  CLINICAL DATA:  Lung mass. EXAM: CHEST  2 VIEW COMPARISON:  05/19/2015 PET scan FINDINGS: The right upper lobe mass is clearly visible, projecting between the right first and second anterior ribs. The lungs are otherwise clear, with no acute abnormalities. Pulmonary vasculature is normal. No pleural effusions. Heart size is normal. IMPRESSION: Right upper lobe nodule.  No acute cardiopulmonary findings. Electronically Signed   By: Andreas Newport M.D.   On: 05/31/2015 07:06   Nm Pet Image Initial (pi) Skull Base To Thigh  05/19/2015  CLINICAL DATA:  Initial treatment strategy for right upper lobe pulmonary nodule. EXAM: NUCLEAR MEDICINE PET SKULL BASE TO THIGH TECHNIQUE:  9.2 mCi F-18 FDG was injected intravenously. Full-ring PET imaging was performed from the skull base to thigh after the radiotracer. CT data was obtained and used for attenuation correction and anatomic localization. FASTING BLOOD GLUCOSE:  Value: 115 mg/dl COMPARISON:  Chest CT on 05/02/2015 FINDINGS: NECK No hypermetabolic lymph nodes in the neck. CHEST 2.8 cm posterior right upper lobe pulmonary nodule is hypermetabolic, with SUV max of 12.3. Other tiny sub-cm bilateral upper lobe pulmonary nodules show absence of metabolic activity but are too small to characterize. 11 mm right paratracheal mediastinal lymph node is hypermetabolic, with SUV max of 9.5. Asymmetric hypermetabolic activity also noted in the right hilum with SUV max of 4.6. Asymmetric ill-defined soft tissue density is seen in the central right breast which shows mild hypermetabolic activity, with SUV  max of 3.3. This may represent asymmetric gynecomastia, however primary breast carcinoma cannot be excluded. No hypermetabolic axillary lymph nodes identified. ABDOMEN/PELVIS No abnormal hypermetabolic activity within the liver, pancreas, adrenal glands, or spleen. No hypermetabolic lymph nodes in the abdomen or pelvis. Mildly enlarged prostate noted as well as diffuse bladder wall thickening, consistent with chronic bladder outlet obstruction SKELETON No focal hypermetabolic activity to suggest skeletal metastasis. IMPRESSION: 2.8 cm hypermetabolic posterior right upper lobe pulmonary nodule, consistent with primary bronchogenic carcinoma. Mild hypermetabolic right hilar and right paratracheal lymphadenopathy, suspicious for metastatic disease. Bilateral sub-cm indeterminate pulmonary nodules show absence of metabolic activity. Recommend attention on follow-up CT. Asymmetric left breast soft tissue density with low-grade metabolic activity. Differential diagnosis includes asymmetric left-sided gynecomastia and breast carcinoma. Recommend correlation  with physical exam findings, and consider diagnostic mammography/breast imaging for further evaluation. No evidence of metastatic disease within the neck, abdomen, or pelvis. Electronically Signed   By: Earle Gell M.D.   On: 05/19/2015 08:57   Dg Chest Port 1 View  05/31/2015  CLINICAL DATA:  Post biopsy for right lung mass EXAM: PORTABLE CHEST 1 VIEW COMPARISON:  Study obtained earlier in the day FINDINGS: There is a nodular lesion in the right upper lobe measuring 2.8 x 2.8 cm. Lungs elsewhere clear. Heart size and pulmonary vascular normal. No adenopathy. No pneumothorax. No bone lesions. There is calcification in the aortic arch region. IMPRESSION: No change in right upper lobe nodular lesion. No new opacity. No pneumothorax. No change in cardiac silhouette. Electronically Signed   By: Lowella Grip III M.D.   On: 05/31/2015 10:08    ASSESSMENT: This is a very pleasant 74 years old African-American male recently diagnosed with a stage IIIA (T1b, N2, M0) non-small cell lung cancer, adenocarcinoma presented with right right upper lobe nodule in addition to mediastinal lymphadenopathy diagnosed in November 2016. The patient also has small pulmonary nodules that need close observation. He also has enlargement of the left breast suspicious for gynecomastia versus breast carcinoma.   PLAN: I had a lengthy discussion with the patient and his wife today about his current disease stage, prognosis and treatment options. I will complete the staging workup by ordering a MRI of the brain to rule out brain metastasis. I would also order a mammogram of the left breast to rule out the presence of any breast carcinoma. If the patient has no evidence for metastatic disease, he would be considered for a course of concurrent chemoradiation. I recommended for the patient a course of concurrent chemoradiation with weekly carboplatin for AUC of 2 and paclitaxel 45 MG/M2. I discussed with the patient adverse effect  of the chemotherapy including but not limited to alopecia, myelosuppression, nausea and vomiting, peripheral neuropathy, liver or renal dysfunction. I will arrange for the patient to have a chemotherapy education class before starting the first dose of his chemotherapy. The patient would see radiation oncology next week for evaluation and discussion of the radiotherapy option. I expect him to start the first cycle of his concurrent chemoradiation on 06/13/2015. He would come back for follow-up visit in 3 weeks for evaluation and management of any adverse effect of his treatment. I will call his pharmacy with prescription for Compazine 10 mg by mouth every 6 hours as needed for nausea. The patient was advised to call immediately if he has any concerning symptoms in the interval. The patient voices understanding of current disease status and treatment options and is in agreement with the current care plan.  All questions were answered. The patient knows to call the clinic with any problems, questions or concerns. We can certainly see the patient much sooner if necessary.  Thank you so much for allowing me to participate in the care of [name]. I will continue to follow up the patient with you and assist in his care.  I spent 55 minutes counseling the patient face to face. The total time spent in the appointment was 80 minutes.  Disclaimer: This note was dictated with voice recognition software. Similar sounding words can inadvertently be transcribed and may not be corrected upon review.   Lluvia Gwynne K. June 03, 2015, 10:39 AM

## 2015-06-03 NOTE — Telephone Encounter (Signed)
s.w. pt and advisedon DEc appt.Marland KitchenMarland KitchenMarland KitchenMarland Kitchenpt ok and aware...he will get new sched at chemo ed class

## 2015-06-04 ENCOUNTER — Encounter: Payer: Self-pay | Admitting: Internal Medicine

## 2015-06-04 MED ORDER — PROCHLORPERAZINE MALEATE 10 MG PO TABS: 10.0000 mg | ORAL_TABLET | Freq: Four times a day (QID) | ORAL | Status: DC | PRN

## 2015-06-05 ENCOUNTER — Other Ambulatory Visit: Payer: Self-pay | Admitting: Pulmonary Disease

## 2015-06-06 ENCOUNTER — Other Ambulatory Visit: Payer: Medicare HMO

## 2015-06-07 ENCOUNTER — Ambulatory Visit (INDEPENDENT_AMBULATORY_CARE_PROVIDER_SITE_OTHER): Payer: Medicare HMO | Admitting: Gastroenterology

## 2015-06-07 ENCOUNTER — Other Ambulatory Visit: Payer: Self-pay | Admitting: Oncology

## 2015-06-07 ENCOUNTER — Encounter: Payer: Self-pay | Admitting: Gastroenterology

## 2015-06-07 VITALS — BP 142/80 | HR 72 | Ht 66.0 in | Wt 174.0 lb

## 2015-06-07 DIAGNOSIS — K5900 Constipation, unspecified: Secondary | ICD-10-CM | POA: Diagnosis not present

## 2015-06-07 MED ORDER — LINACLOTIDE 290 MCG PO CAPS: 290.0000 ug | ORAL_CAPSULE | Freq: Every day | ORAL | Status: DC

## 2015-06-07 NOTE — Patient Instructions (Signed)
We will send Linzess into your pharmacy We have given you some savings cards for the Linzess  Follow up in 6 months

## 2015-06-07 NOTE — Progress Notes (Signed)
[facility]    [id number]    1941-06-28  Primary Care Physician:NADEL,SCOTT M, MD  Referring Physician: [name], MD [place], [place] [address]  Chief complaint:  Constipation  HPI:  74 year old African-American male with chronic constipation here for follow-up visit. He was diagnosed with prostate cancer about a year ago and recently diagnosed with lung cancer, is planning to undergo chemotherapy. He is been having constipation for a few years, was initially on MiraLAX with no improvement then took Linzess 145 and currently on Linzess 290 on alternate days with MiraLAX twice a day. He is having liquid soft bowel movement every other day. He doesn't have bowel movement if he does not take Linzess. No black stool or blood in stool. Denies nausea, vomiting, bloating or abdominal pain   Outpatient Encounter Prescriptions as of 06/07/2015  Medication Sig  . amLODipine (NORVASC) 5 MG tablet TAKE ONE TABLET BY MOUTH IN THE MORNING  . aspirin EC 81 MG tablet Take 81 mg by mouth daily.   . brimonidine-timolol (COMBIGAN) 0.2-0.5 % ophthalmic solution Place 1 drop into both eyes every 12 (twelve) hours.  . finasteride ([name]) 5 MG tablet Take 5 mg by mouth daily.  . fluticasone (FLONASE) 50 MCG/ACT nasal spray Place 2 sprays into both nostrils daily. (Patient taking differently: Place 2 sprays into both nostrils daily as needed for allergies or rhinitis. )  . lactulose (CHRONULAC) 10 GM/15ML solution Take 10 g by mouth 2 (two) times daily as needed (constipation).   [name] levothyroxine (SYNTHROID, LEVOTHROID) 88 MCG tablet Take 1 tablet (88 mcg total) by mouth daily. (Patient taking differently: Take 88 mcg by mouth daily before breakfast. )  . Linaclotide (LINZESS) 290 MCG CAPS capsule Take 1 capsule (290 mcg total) by mouth daily. (Patient taking differently: Take 290 mcg by mouth daily as needed (constipation). )  . omeprazole (PRILOSEC) 20 MG capsule Take 1 capsule  (20 mg total) by mouth every morning. (Patient taking differently: Take 20 mg by mouth daily before breakfast. )  . polyethylene glycol (MIRALAX / GLYCOLAX) packet Take 17 g by mouth daily as needed (constipation). Mix in 8 oz of liquid and drink  . prochlorperazine (COMPAZINE) 10 MG tablet Take 1 tablet (10 mg total) by mouth every 6 (six) hours as needed for nausea or vomiting.  . sildenafil (VIAGRA) 100 MG tablet Take 1 tablet (100 mg total) by mouth as directed. (Patient taking differently: Take 100 mg by mouth daily as needed for erectile dysfunction. )  . simvastatin (ZOCOR) 20 MG tablet TAKE ONE TABLET BY MOUTH AT BEDTIME  . tamsulosin (FLOMAX) 0.4 MG CAPS capsule Take 1 capsule (0.4 mg total) by mouth every evening. (Patient taking differently: Take 0.8 mg by mouth at bedtime. )  . traMADol (ULTRAM) 50 MG tablet Take 1 tablet (50 mg total) by mouth every 6 (six) hours as needed for moderate pain.  . Linaclotide (LINZESS) 290 MCG CAPS capsule Take 1 capsule (290 mcg total) by mouth daily.   No facility-administered encounter medications on file as of 06/07/2015.    Allergies as of 06/07/2015 - Review Complete 06/07/2015  Allergen Reaction Noted  . Codeine Other (See Comments)     Past Medical History  Diagnosis Date  . Sleep apnea     does not use cpap  . Unspecified essential hypertension   . Hypercholesteremia   . GERD (gastroesophageal reflux disease)   . Diverticulosis of  colon (without mention of hemorrhage)   . Irritable bowel syndrome   . Benign neoplasm of colon     colon polyps  . Benign hypertrophy of prostate   . Erectile dysfunction   . Restless legs syndrome (RLS)   . Anxiety state, unspecified   . Onychomycosis   . Constipation, chronic   . Hypothyroidism   . Cancer [facility])     prostate cancer  . Glaucoma     Past Surgical History  Procedure Laterality Date  . None    . Colonoscopy    . Laparoscopic appendectomy N/A 01/27/2013    Procedure: APPENDECTOMY  LAPAROSCOPIC;  Surgeon: Edward Jolly, MD;  Location: [facility];  Service: General;  Laterality: N/A;  . Appendectomy  12/2012  . Thyroidectomy    . Tonsillectomy    . Cardiac catheterization  ? 2008  . Video bronchoscopy N/A 05/31/2015    Procedure: VIDEO BRONCHOSCOPY;  Surgeon: Gaye Pollack, MD;  Location: [place];  Service: Thoracic;  Laterality: N/A;  . Mediastinoscopy N/A 05/31/2015    Procedure: MEDIASTINOSCOPY;  Surgeon: Gaye Pollack, MD;  Location[facility];  Service: Thoracic;  Laterality: N/A;    Family History  Problem Relation Age of Onset  . Colon cancer Paternal Grandfather 61  . Cancer Paternal Grandfather     prostate  . Cancer Father     prostate  . Cancer Brother     prostate    Social History   Social History  . Marital Status: Married    Spouse Name: N/A  . Number of Children: N/A  . Years of Education: N/A   Occupational History  . retired    Social History Main Topics  . Smoking status: Former Smoker -- 2.00 packs/day for 15 years    Types: Cigarettes    Quit date: 07/02/1968  . Smokeless tobacco: Former Systems developer    Types: Chew, Snuff  . Alcohol Use: No  . Drug Use: No  . Sexual Activity: Not on file   Other Topics Concern  . Not on file   Social History Narrative      Review of systems: Review of Systems  Constitutional: Negative for fever and chills.  HENT: Negative.   Eyes: Negative for blurred vision.  Respiratory: Negative for cough, shortness of breath and wheezing.   Cardiovascular: Negative for chest pain and palpitations.  Gastrointestinal: as per HPI Genitourinary: Negative for dysuria, urgency, frequency and hematuria.  Musculoskeletal: Negative for myalgias, back pain and joint pain.  Skin: Negative for itching and rash.  Neurological: Negative for dizziness, tremors, focal weakness, seizures and loss of consciousness.  Endo/Heme/Allergies: Negative for environmental allergies.  Psychiatric/Behavioral: Negative for  depression, suicidal ideas and hallucinations.  All other systems reviewed and are negative.   Physical Exam: Filed Vitals:   06/07/15 1452  BP: 142/80  Pulse: 72   Gen:      No acute distress HEENT:  EOMI, sclera anicteric Neck:     No masses; no thyromegaly Lungs:    Clear to auscultation bilaterally; normal respiratory effort CV:         Regular rate and rhythm; no murmurs Abd:      + bowel sounds; soft, non-tender; no palpable masses, no distension Ext:    No edema; adequate peripheral perfusion Skin:      Warm and dry; no rash Neuro: alert and oriented x 3 Psych: normal mood and affect  Data Reviewed:  Colonoscopy 2014: internal hemorrhoids otherwise normal  Assessment and Plan/Recommendations:  74 year old male with prostate cancer and recently diagnosed lung cancer here for follow-up visit with chronic constipation Continue Linzess 290 every other day Advised patient to decrease MiraLAX to one capful every other day alternating with Linzess Maintain adequate fluid intake Avoid excessive fiber Return in 6 months  K. Denzil Magnuson , MD [phone] Mon-Fri 8a-5p [phone] after 5p, weekends, holidays

## 2015-06-08 ENCOUNTER — Encounter: Payer: Self-pay | Admitting: Surgery

## 2015-06-08 ENCOUNTER — Ambulatory Visit (INDEPENDENT_AMBULATORY_CARE_PROVIDER_SITE_OTHER): Payer: Self-pay | Admitting: Surgery

## 2015-06-08 ENCOUNTER — Telehealth: Payer: Self-pay | Admitting: *Deleted

## 2015-06-08 VITALS — BP 132/78 | HR 90 | Resp 16 | Ht 66.0 in | Wt 174.0 lb

## 2015-06-08 DIAGNOSIS — R911 Solitary pulmonary nodule: Secondary | ICD-10-CM

## 2015-06-08 DIAGNOSIS — R591 Generalized enlarged lymph nodes: Secondary | ICD-10-CM

## 2015-06-08 DIAGNOSIS — R599 Enlarged lymph nodes, unspecified: Secondary | ICD-10-CM

## 2015-06-08 NOTE — Telephone Encounter (Signed)
Left message w/ a friendly reminder about pt's clinic appt tomorrow.

## 2015-06-09 ENCOUNTER — Other Ambulatory Visit (HOSPITAL_COMMUNITY)
Admission: RE | Admit: 2015-06-09 | Discharge: 2015-06-09 | Disposition: A | Discharge status: Home / Self Care | Payer: Medicare HMO | Source: Ambulatory Visit | Attending: Internal Medicine | Admitting: Internal Medicine

## 2015-06-09 ENCOUNTER — Ambulatory Visit: Payer: Medicare HMO | Attending: Radiation Oncology | Admitting: Physical Therapy

## 2015-06-09 ENCOUNTER — Encounter: Payer: Self-pay | Admitting: *Deleted

## 2015-06-09 ENCOUNTER — Ambulatory Visit
Admission: RE | Admit: 2015-06-09 | Discharge: 2015-06-09 | Disposition: A | Discharge status: Home / Self Care | Payer: Medicare HMO | Source: Ambulatory Visit | Attending: Radiation Oncology | Admitting: Radiation Oncology

## 2015-06-09 ENCOUNTER — Other Ambulatory Visit: Payer: Self-pay | Admitting: Internal Medicine

## 2015-06-09 VITALS — BP 143/90 | HR 90 | Temp 97.5°F | Resp 18 | Wt 178.0 lb

## 2015-06-09 DIAGNOSIS — N62 Hypertrophy of breast: Secondary | ICD-10-CM

## 2015-06-09 DIAGNOSIS — C3411 Malignant neoplasm of upper lobe, right bronchus or lung: Secondary | ICD-10-CM | POA: Diagnosis present

## 2015-06-09 DIAGNOSIS — R5381 Other malaise: Secondary | ICD-10-CM | POA: Insufficient documentation

## 2015-06-09 NOTE — Progress Notes (Signed)
Called precert and then scheduled Mammogram.  I will update patient at thoracic clinic today.

## 2015-06-09 NOTE — Progress Notes (Signed)
Radiation Oncology         [phone] ________________________________  Initial Outpatient Consultation  Name: Alan [name] MRN: [id number]  Date: 06/09/2015  DOB: 1940-10-19  NT:ZGYFV,CBSWH Jerilynn Mages, MD  Gaye Pollack, MD   REFERRING PHYSICIAN: Gaye Pollack, MD  DIAGNOSIS: The encounter diagnosis was Malignant neoplasm of right upper lobe of lung ([place]).  Stage III-a Non-Small Cell Lung Cancer  HISTORY OF PRESENT ILLNESS::Alan [name] is a 74 y.o. male who is seen out courtesy of Dr. Cyndia Bent for an opinion concerning radiation therapy as part of the patient's overall management.  The patient was seen recently by his primary care physician for gynecomastia of the left breast that was thought to be secondary to change in his thyroid medications. During his evaluation, a chest x-ray was performed on 04/21/2015 and it showed right upper lobe infiltrate versus mass lesion new from the prior study of 10/25/2012. This was followed by CT scan of the chest without contrast on 05/02/2015 and it showed a spiculated posterior right upper lobe pulmonary nodule measuring 2.2 x 2.6 cm. There was also a low right paratracheal/precarinal node mildly enlarged and measured 1.3 cm. The scan also showed an anterior medial right upper lobe pulmonary nodule measuring 0.6 cm and a probable solid pulmonary nodule in the left apex measuring 0.6 cm. There was other smaller pulmonary nodules primarily subpleural in distribution and may represent subpleural lymph nodes. This was followed by a PET scan on 05/19/2015 and it showed a 2.8 cm hypermetabolic posterior right upper lobe pulmonary nodule consistent with primary bronchogenic carcinoma. There were also a mild hypermetabolic right hilar and a right paratracheal lymph node suspicious for metastatic disease. The bilateral sub-centimeter indeterminate pulmonary nodules showed absence of metabolic activity. There was also a symmetric left breast soft tissue density with  low-grade metabolic activity questionable for a symmetric left sided gynecomastia or breast carcinoma. The patient was seen by Dr. Cyndia Bent and he underwent flexible video bronchoscopy as well as mediastinoscopy with lymph node biopsies on 05/31/2015. The final pathology of 4R and 2R lymph nodes showed metastatic adenocarcinoma. Dr. Cyndia Bent kindly referred the patient to Dr. [name] on 06/03/15 for evaluation and recommendation of systemic treatment of his condition. He will complete the staging workup by ordering a MRI of the brain to rule out brain metastasis. This has been scheduled on 06/21/15. He also ordered a mammogram and ultrasound of the left breast, on 06/15/15, to rule out the presence of any breast carcinoma. If the patient has no evidence for metastatic disease, he would be considered for a course of concurrent chemoradiation. Dr. [name] has recommended for the patient a course of concurrent chemoradiation with weekly carboplatin for AUC of 2 and paclitaxel 45 MG/M2.   The patient has a personal history of prostate cancer currently on observation under the care of Dr. Gaynelle Arabian diagnosed in October 2015. He is taking [name].  The patient presents to me today for my opinion and discussion regarding radiation for the management of his disease. He has questions concerning dental care while undergoing treatment.  PREVIOUS RADIATION THERAPY: Yes. Radioactive thyroid treatment for hyperthyroidism   PAST MEDICAL HISTORY:  has a past medical history of Sleep apnea; Unspecified essential hypertension; Hypercholesteremia; GERD (gastroesophageal reflux disease); Diverticulosis of colon (without mention of hemorrhage); Irritable bowel syndrome; Benign neoplasm of colon; Benign hypertrophy of prostate; Erectile dysfunction; Restless legs syndrome (RLS); Anxiety state, unspecified; Onychomycosis; Constipation, chronic; Hypothyroidism; Cancer ([place]); and Glaucoma.    PAST SURGICAL HISTORY:  Past Surgical  History  Procedure Laterality Date  . None    . Colonoscopy    . Laparoscopic appendectomy N/A 01/27/2013    Procedure: APPENDECTOMY LAPAROSCOPIC;  Surgeon: Edward Jolly, MD;  Location: [facility];  Service: General;  Laterality: N/A;  . Appendectomy  12/2012  . Thyroidectomy    . Tonsillectomy    . Cardiac catheterization  ? 2008  . Video bronchoscopy N/A 05/31/2015    Procedure: VIDEO BRONCHOSCOPY;  Surgeon: Gaye Pollack, MD;  Location: [facility];  Service: Thoracic;  Laterality: N/A;  . Mediastinoscopy N/A 05/31/2015    Procedure: MEDIASTINOSCOPY;  Surgeon: Gaye Pollack, MD;  Location: [facility];  Service: Thoracic;  Laterality: N/A;    FAMILY HISTORY: family history includes Cancer in his brother, father, and paternal grandfather; Colon cancer (age of onset: 32) in his paternal grandfather. (prostate cancer for brother, father and paternal grandfather)  SOCIAL HISTORY:  reports that he quit smoking about 46 years ago. His smoking use included Cigarettes. He has a 30 pack-year smoking history. He has quit using smokeless tobacco. His smokeless tobacco use included Chew and Snuff. He reports that he does not drink alcohol or use illicit drugs. works as a Theme park manager  ALLERGIES: Codeine  MEDICATIONS:  Current Outpatient Prescriptions  Medication Sig Dispense Refill  . amLODipine (NORVASC) 5 MG tablet TAKE ONE TABLET BY MOUTH IN THE MORNING 90 tablet 0  . aspirin EC 81 MG tablet Take 81 mg by mouth daily.     . brimonidine-timolol (COMBIGAN) 0.2-0.5 % ophthalmic solution Place 1 drop into both eyes every 12 (twelve) hours.    . finasteride ([name]) 5 MG tablet Take 5 mg by mouth daily.    . fluticasone (FLONASE) 50 MCG/ACT nasal spray Place 2 sprays into both nostrils daily. (Patient taking differently: Place 2 sprays into both nostrils daily as needed for allergies or rhinitis. ) 48 g 3  . lactulose (CHRONULAC) 10 GM/15ML solution Take 10 g by mouth 2 (two) times daily as needed  (constipation).     [name] levothyroxine (SYNTHROID, LEVOTHROID) 88 MCG tablet Take 1 tablet (88 mcg total) by mouth daily. (Patient taking differently: Take 88 mcg by mouth daily before breakfast. ) 45 tablet 2  . Linaclotide (LINZESS) 290 MCG CAPS capsule Take 1 capsule (290 mcg total) by mouth daily. (Patient taking differently: Take 290 mcg by mouth daily as needed (constipation). ) 30 capsule prn  . Linaclotide (LINZESS) 290 MCG CAPS capsule Take 1 capsule (290 mcg total) by mouth daily. 30 capsule 3  . omeprazole (PRILOSEC) 20 MG capsule Take 1 capsule (20 mg total) by mouth every morning. (Patient taking differently: Take 20 mg by mouth daily before breakfast. ) 90 capsule 3  . polyethylene glycol (MIRALAX / GLYCOLAX) packet Take 17 g by mouth daily as needed (constipation). Mix in 8 oz of liquid and drink    . prochlorperazine (COMPAZINE) 10 MG tablet Take 1 tablet (10 mg total) by mouth every 6 (six) hours as needed for nausea or vomiting. 30 tablet 0  . sildenafil (VIAGRA) 100 MG tablet Take 1 tablet (100 mg total) by mouth as directed. (Patient taking differently: Take 100 mg by mouth daily as needed for erectile dysfunction. ) 10 tablet 0  . simvastatin (ZOCOR) 20 MG tablet TAKE ONE TABLET BY MOUTH AT BEDTIME 90 tablet 0  . tamsulosin (FLOMAX) 0.4 MG CAPS capsule Take 1 capsule (0.4 mg total) by mouth every evening. (Patient taking differently: Take 0.8 mg  by mouth at bedtime. ) 90 capsule 3  . traMADol (ULTRAM) 50 MG tablet Take 1 tablet (50 mg total) by mouth every 6 (six) hours as needed for moderate pain. (Patient not taking: Reported on 06/08/2015) 20 tablet 0   No current facility-administered medications for this encounter.    REVIEW OF SYSTEMS:  A 15 point review of systems is documented in the electronic medical record. This was obtained by the nursing staff. However, I reviewed this with the patient to discuss relevant findings and make appropriate changes.  Pertinent items are noted  in HPI. no symptoms referable to his lung cancer  He reports a dental abscess. Denies nipple drainage or bleeding of the left breast. Denies left arm swelling. Denies back pain, headaches, or changes in vision. Denies difficulty breathing.   PHYSICAL EXAM:  weight is 178 lb (80.74 kg). His temperature is 97.5 F (36.4 C). His blood pressure is 143/90 and his pulse is 90. His respiration is 18 and oxygen saturation is 100%.   General: Alert and oriented, in no acute distress, accompanied by his wife on evaluation today HEENT: Head is normocephalic. Extraocular movements are intact. Oropharynx is clear. Neck: Neck is supple, no palpable cervical or supraclavicular lymphadenopathy. Heart: Regular in rate and rhythm with no murmurs, rubs, or gallops. Chest: Clear to auscultation bilaterally, with no rhonchi, wheezes, or rales. Small scar on the anterior upper chest from mediastinoscopy (no signs of infection). Diffuse enlargement of the left breast, no dominant mass or nipple charge. Some mild enlargement of the right breast tissue. Abdomen: Soft, nontender, nondistended, with no rigidity or guarding. Extremities: No cyanosis or edema. Lymphatics: see Neck Exam Skin: No concerning lesions. Musculoskeletal: symmetric strength and muscle tone throughout. Neurologic: Cranial nerves II through XII are grossly intact. No obvious focalities. Speech is fluent. Coordination is intact. Psychiatric: Judgment and insight are intact. Affect is appropriate.  ECOG = 0  LABORATORY DATA:  Lab Results  Component Value Date   WBC 3.9* 05/31/2015   HGB 14.2 05/31/2015   HCT 43.0 05/31/2015   MCV 88.1 05/31/2015   PLT 218 05/31/2015   NEUTROABS 1.8 04/21/2015   Lab Results  Component Value Date   NA 142 05/31/2015   K 3.7 05/31/2015   CL 110 05/31/2015   CO2 25 05/31/2015   GLUCOSE 109* 05/31/2015   CREATININE 1.03 05/31/2015   CALCIUM 9.1 05/31/2015      RADIOGRAPHY: Dg Chest 2 View  05/31/2015   CLINICAL DATA:  Lung mass. EXAM: CHEST  2 VIEW COMPARISON:  05/19/2015 PET scan FINDINGS: The right upper lobe mass is clearly visible, projecting between the right first and second anterior ribs. The lungs are otherwise clear, with no acute abnormalities. Pulmonary vasculature is normal. No pleural effusions. Heart size is normal. IMPRESSION: Right upper lobe nodule.  No acute cardiopulmonary findings. Electronically Signed   By: Andreas Newport M.D.   On: 05/31/2015 07:06   Nm Pet Image Initial (pi) Skull Base To Thigh  05/19/2015  CLINICAL DATA:  Initial treatment strategy for right upper lobe pulmonary nodule. EXAM: NUCLEAR MEDICINE PET SKULL BASE TO THIGH TECHNIQUE: 9.2 mCi F-18 FDG was injected intravenously. Full-ring PET imaging was performed from the skull base to thigh after the radiotracer. CT data was obtained and used for attenuation correction and anatomic localization. FASTING BLOOD GLUCOSE:  Value: 115 mg/dl COMPARISON:  Chest CT on 05/02/2015 FINDINGS: NECK No hypermetabolic lymph nodes in the neck. CHEST 2.8 cm posterior right upper lobe pulmonary  nodule is hypermetabolic, with SUV max of 12.3. Other tiny sub-cm bilateral upper lobe pulmonary nodules show absence of metabolic activity but are too small to characterize. 11 mm right paratracheal mediastinal lymph node is hypermetabolic, with SUV max of 9.5. Asymmetric hypermetabolic activity also noted in the right hilum with SUV max of 4.6. Asymmetric ill-defined soft tissue density is seen in the central right breast which shows mild hypermetabolic activity, with SUV max of 3.3. This may represent asymmetric gynecomastia, however primary breast carcinoma cannot be excluded. No hypermetabolic axillary lymph nodes identified. ABDOMEN/PELVIS No abnormal hypermetabolic activity within the liver, pancreas, adrenal glands, or spleen. No hypermetabolic lymph nodes in the abdomen or pelvis. Mildly enlarged prostate noted as well as diffuse bladder  wall thickening, consistent with chronic bladder outlet obstruction SKELETON No focal hypermetabolic activity to suggest skeletal metastasis. IMPRESSION: 2.8 cm hypermetabolic posterior right upper lobe pulmonary nodule, consistent with primary bronchogenic carcinoma. Mild hypermetabolic right hilar and right paratracheal lymphadenopathy, suspicious for metastatic disease. Bilateral sub-cm indeterminate pulmonary nodules show absence of metabolic activity. Recommend attention on follow-up CT. Asymmetric left breast soft tissue density with low-grade metabolic activity. Differential diagnosis includes asymmetric left-sided gynecomastia and breast carcinoma. Recommend correlation with physical exam findings, and consider diagnostic mammography/breast imaging for further evaluation. No evidence of metastatic disease within the neck, abdomen, or pelvis. Electronically Signed   By: Earle Gell M.D.   On: 05/19/2015 08:57   Dg Chest Port 1 View  05/31/2015  CLINICAL DATA:  Post biopsy for right lung mass EXAM: PORTABLE CHEST 1 VIEW COMPARISON:  Study obtained earlier in the day FINDINGS: There is a nodular lesion in the right upper lobe measuring 2.8 x 2.8 cm. Lungs elsewhere clear. Heart size and pulmonary vascular normal. No adenopathy. No pneumothorax. No bone lesions. There is calcification in the aortic arch region. IMPRESSION: No change in right upper lobe nodular lesion. No new opacity. No pneumothorax. No change in cardiac silhouette. Electronically Signed   By: Lowella Grip III M.D.   On: 05/31/2015 10:08      IMPRESSION: Stage III-a Non-Small Cell Lung Cancer, pending results of brain MRI  PLAN: We discussed his diagnosis and stage. We discussed definitive chemoradiation in the management of his disease. We discussed 6 weeks of treatment as an outpatient. We discussed the process of CT simulation and the placement of tattoos. We discussed dysphagia, weight loss, and fatigue as the acute side  effects of radiation. We discussed damage to critical normal structures in the chest as well as the spinal cord as possible but improbably. The patient signed a consent form and this was placed in his medical chart.  We have scheduled him for CT simulation next week on Tuesday, 06/14/15, at [facility]. His tentative concurrent chemoradiation date has been pushed back from 06/13/15 to 06/20/15.     ------------------------------------------------  Blair Promise, PhD, MD  This document serves as a record of services personally performed by Gery Pray, MD. It was created on his behalf by Darcus Austin, a trained medical scribe. The creation of this record is based on the scribe's personal observations and the provider's statements to them. This document has been checked and approved by the attending provider.

## 2015-06-09 NOTE — Addendum Note (Signed)
Encounter addended by: Gery Pray, MD on: 06/09/2015  6:18 PM<BR>     Documentation filed: Follow-up Section

## 2015-06-09 NOTE — Therapy (Signed)
[place], [place], [address] Phone: [phone]   Fax:  [phone]  Physical Therapy Evaluation  Patient Details  Name: Alan [name] MRN: [id number] Date of Birth: [birth year] Referring Provider: Dr. Gery Pray  Encounter Date: 06/09/2015      PT End of Session - 06/09/15 1614    Visit Number 1   Number of Visits 1   PT Start Time 1430   PT Stop Time 1500   PT Time Calculation (min) 30 min   Activity Tolerance Patient tolerated treatment well   Behavior During Therapy [facility] for tasks assessed/performed      Past Medical History  Diagnosis Date  . Sleep apnea     does not use cpap  . Unspecified essential hypertension   . Hypercholesteremia   . GERD (gastroesophageal reflux disease)   . Diverticulosis of colon (without mention of hemorrhage)   . Irritable bowel syndrome   . Benign neoplasm of colon     colon polyps  . Benign hypertrophy of prostate   . Erectile dysfunction   . Restless legs syndrome (RLS)   . Anxiety state, unspecified   . Onychomycosis   . Constipation, chronic   . Hypothyroidism   . Cancer [facility])     prostate cancer  . Glaucoma     Past Surgical History  Procedure Laterality Date  . None    . Colonoscopy    . Laparoscopic appendectomy N/A 01/27/2013    Procedure: APPENDECTOMY LAPAROSCOPIC;  Surgeon: Edward Jolly, MD;  Location: [facility];  Service: General;  Laterality: N/A;  . Appendectomy  12/2012  . Thyroidectomy    . Tonsillectomy    . Cardiac catheterization  ? 2008  . Video bronchoscopy N/A 05/31/2015    Procedure: VIDEO BRONCHOSCOPY;  Surgeon: Gaye Pollack, MD;  Location: [facility] OR;  Service: Thoracic;  Laterality: N/A;  . Mediastinoscopy N/A 05/31/2015    Procedure: MEDIASTINOSCOPY;  Surgeon: Gaye Pollack, MD;  Location: [facility];  Service: Thoracic;  Laterality: N/A;    There were no vitals filed for this visit.  Visit Diagnosis:  Physical  deconditioning - Plan: PT plan of care cert/re-cert      Subjective Assessment - 06/09/15 1603    Subjective Patient says he feels good, like nothing is wrong.   Patient is accompained by: Family member  spouse   Pertinent History Patient presented with gynecomastia to primary care provider.  Work-up showed right upper lung mass.  Has had CT, PET, and bronchoscopy/mediastinoscopy and was diagnosed with a 2.8 cm. right hilar mass and positive node, metastatic adenocarcinoma, probably stage Expected to be treated with concurrent chemoradiation.  Ex-smoker who quit in 1973 after 40 pack-years.  Prostate cancer 2015.     Patient Stated Goals get information from all lung clinic providers   Currently in Pain? No/denies            [facility] PT Assessment - 06/09/15 0001    Assessment   Medical Diagnosis right lung adenocarcinoma, probably stage IIIA   Referring Provider Dr. Gery Pray   Precautions   Precautions Other (comment)   Precaution Comments cancer precautions   Restrictions   Weight Bearing Restrictions No   Balance Screen   Has the patient fallen in the past 6 months No   Has the patient had a decrease in activity level because of a fear of falling?  No   Is the patient reluctant to leave their home because  of a fear of falling?  No   Home Ecologist residence   Living Arrangements Spouse/significant other   Type of [place] Multi-level  "few steps up and down"   Prior Function   Level of Independence Independent   Leisure no regular exercise   Cognition   Overall Cognitive Status Within Functional Limits for tasks assessed   Observation/Other Assessments   Observations pleasant, talkative, friendly man who seems surprised at his diagnosis because he feels good   Functional Tests   Functional tests Sit to Stand   Sit to Stand   Comments 17 times in 30 seconds with mild shortness of breath  good number of repetitions    Posture/Postural Control   Posture/Postural Control Postural limitations   Postural Limitations Forward head  slight   ROM / Strength   AROM / PROM / Strength AROM   AROM   Overall AROM Comments standing trunk AROM:  in forward flexion, reaches fingertips 15 inches to floor; other motions WFL, though he moves slowly   Ambulation/Gait   Ambulation/Gait Yes   Ambulation/Gait Assistance 7: Independent   Balance   Balance Assessed Yes   Dynamic Standing Balance   Dynamic Standing - Comments reaches 10 inches forward in standing  below average for age                           PT Education - Jun 29, 2015 1613    Education provided Yes   Education Details energy conservation, walking, Cure article on staying active, posture, breathing, PT info, info on [employer] at the [employer]) Educated Patient;Spouse   Methods Explanation;Handout   Comprehension Verbalized understanding               Lung Clinic Goals - 06-29-15 1617    Patient will be able to verbalize understanding of the benefit of exercise to decrease fatigue.   Status Achieved   Patient will be able to verbalize the importance of posture.   Status Achieved   Patient will be able to demonstrate diaphragmatic breathing for improved lung function.   Status Achieved   Patient will be able to verbalize understanding of the role of physical therapy to prevent functional decline and who to contact if physical therapy is needed.   Status Achieved             Plan - 2015/06/29 1614    Clinical Impression Statement Patient is doing well at the moment with his diagnosis of stage IIIB lung cancer; functionally, he has limited forward reach in standing and some shortness of breath on exertion.   Pt will benefit from skilled therapeutic intervention in order to improve on the following deficits Cardiopulmonary status limiting activity;Decreased balance   Rehab Potential Good   PT Frequency One time visit    PT Treatment/Interventions Patient/family education   PT Next Visit Plan None at this time   PT Home Exercise Plan see education section   Consulted and Agree with Plan of Care Patient          G-Codes - 06-29-2015 1617    Functional Assessment Tool Used clinical judgement   Functional Limitation Mobility: Walking and moving around   Mobility: Walking and Moving Around Current Status (X1062) At least 1 percent but less than 20 percent impaired, limited or restricted   Mobility: Walking and Moving Around Goal Status (I9485) At least 1 percent but  less than 20 percent impaired, limited or restricted   Mobility: Walking and Moving Around Discharge Status [phone]) At least 1 percent but less than 20 percent impaired, limited or restricted       Problem List Patient Active Problem List   Diagnosis Date Noted  . Malignant neoplasm of right upper lobe of lung ([place]) 06/03/2015  . Lung mass 06/01/2015  . Constipation 04/21/2015  . 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 Gynecomastia, male 10/20/2014  . Decreased hearing 10/20/2014  . Prostate cancer ([place]) 10/20/2014  . 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 Acute appendicitis with peritoneal abscess 11/13/2012  . CHEST WALL PAIN, ANTERIOR 05/29/2010  . OTHER CONSTIPATION 07/12/2009  . ONYCHOMYCOSIS 10/15/2008  . Anxiety state 10/15/2008  . 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 OBSTRUCTIVE SLEEP APNEA 09/21/2007  . RESTLESS LEG SYNDROME 09/21/2007  . Diverticulosis of large intestine 09/21/2007  . HYPERCHOLESTEROLEMIA 09/11/2007  . ERECTILE DYSFUNCTION 09/11/2007  . Essential hypertension 09/11/2007  . GERD 09/11/2007  . IRRITABLE BOWEL SYNDROME 09/11/2007  . BPH (benign prostatic hypertrophy) with urinary obstruction 09/11/2007  . COLONIC POLYPS 10/30/2005    SALISBURY,DONNA 06/09/2015, 4:19 PM  [place], [place], [address] Phone: [phone]   Fax:   [phone]  Name: Alan [name] MRN: [id number] Date of Birth: [birth year]   Serafina Royals, PT 06/09/2015 4:19 PM

## 2015-06-09 NOTE — Progress Notes (Signed)
Oncology Nurse Navigator Documentation  Oncology Nurse Navigator Flowsheets 06/09/2015  Navigator Encounter Type Clinic/MDC/spoke with patient and his wife today.  Gave and explained information regarding lung cancer dx and support services.  Dr. Sondra Come would like to have patient start the week after next with tx.  I contacted Dr. [name] regarding changes.   Patient Visit Type Follow-up  Treatment Phase Abnormal Scans  Interventions Coordination of Care  Coordination of Care MD Appointments  Time Spent with Patient 30

## 2015-06-10 ENCOUNTER — Encounter: Payer: Self-pay | Admitting: Surgery

## 2015-06-10 ENCOUNTER — Other Ambulatory Visit: Payer: Self-pay | Admitting: Internal Medicine

## 2015-06-10 DIAGNOSIS — C3411 Malignant neoplasm of upper lobe, right bronchus or lung: Secondary | ICD-10-CM

## 2015-06-10 NOTE — Progress Notes (Signed)
     HPI: Patient returns for routine postoperative follow-up having undergone  on bronchoscopy and mediastinoscopy with lymph node biopsies on 05/31/2015. Pathology showed metastatic adenocarcinoma. He has been to [facility] clinic and concurrent chemoradiotherapy is planned. He feels well and is upbeat about his treatment plans.    Current Outpatient Prescriptions  Medication Sig Dispense Refill  . amLODipine (NORVASC) 5 MG tablet TAKE ONE TABLET BY MOUTH IN THE MORNING 90 tablet 0  . aspirin EC 81 MG tablet Take 81 mg by mouth daily.     . brimonidine-timolol (COMBIGAN) 0.2-0.5 % ophthalmic solution Place 1 drop into both eyes every 12 (twelve) hours.    . finasteride ([name]) 5 MG tablet Take 5 mg by mouth daily.    . fluticasone (FLONASE) 50 MCG/ACT nasal spray Place 2 sprays into both nostrils daily. (Patient taking differently: Place 2 sprays into both nostrils daily as needed for allergies or rhinitis. ) 48 g 3  . lactulose (CHRONULAC) 10 GM/15ML solution Take 10 g by mouth 2 (two) times daily as needed (constipation).     [name] levothyroxine (SYNTHROID, LEVOTHROID) 88 MCG tablet Take 1 tablet (88 mcg total) by mouth daily. (Patient taking differently: Take 88 mcg by mouth daily before breakfast. ) 45 tablet 2  . Linaclotide (LINZESS) 290 MCG CAPS capsule Take 1 capsule (290 mcg total) by mouth daily. (Patient taking differently: Take 290 mcg by mouth daily as needed (constipation). ) 30 capsule prn  . Linaclotide (LINZESS) 290 MCG CAPS capsule Take 1 capsule (290 mcg total) by mouth daily. 30 capsule 3  . omeprazole (PRILOSEC) 20 MG capsule Take 1 capsule (20 mg total) by mouth every morning. (Patient taking differently: Take 20 mg by mouth daily before breakfast. ) 90 capsule 3  . polyethylene glycol (MIRALAX / GLYCOLAX) packet Take 17 g by mouth daily as needed (constipation). Mix in 8 oz of liquid and drink    . prochlorperazine (COMPAZINE) 10 MG tablet Take 1 tablet (10 mg total) by mouth  every 6 (six) hours as needed for nausea or vomiting. 30 tablet 0  . sildenafil (VIAGRA) 100 MG tablet Take 1 tablet (100 mg total) by mouth as directed. (Patient taking differently: Take 100 mg by mouth daily as needed for erectile dysfunction. ) 10 tablet 0  . simvastatin (ZOCOR) 20 MG tablet TAKE ONE TABLET BY MOUTH AT BEDTIME 90 tablet 0  . tamsulosin (FLOMAX) 0.4 MG CAPS capsule Take 1 capsule (0.4 mg total) by mouth every evening. (Patient taking differently: Take 0.8 mg by mouth at bedtime. ) 90 capsule 3  . traMADol (ULTRAM) 50 MG tablet Take 1 tablet (50 mg total) by mouth every 6 (six) hours as needed for moderate pain. (Patient not taking: Reported on 06/08/2015) 20 tablet 0   No current facility-administered medications for this visit.    Physical Exam: BP 132/78 mmHg  Pulse 90  Resp 16  Ht '5\' 6"'$  (1.676 m)  Wt 174 lb (78.926 kg)  BMI 28.10 kg/m2  SpO2 98% He looks well The incision is healing well  Diagnostic Tests:  None today  Impression:  He has stage IIIA adenocarcinoma of the lung. He is healing well after his mediastinoscopy and bronchoscopy.  Plan:  He will continue treatment plans as outlined by oncology. I will be happy to see him again if the need arises.   Gaye Pollack, MD [facility] [phone]

## 2015-06-13 ENCOUNTER — Other Ambulatory Visit: Payer: Self-pay | Admitting: Radiation Oncology

## 2015-06-13 ENCOUNTER — Other Ambulatory Visit (HOSPITAL_BASED_OUTPATIENT_CLINIC_OR_DEPARTMENT_OTHER): Payer: Medicare HMO

## 2015-06-13 ENCOUNTER — Ambulatory Visit: Payer: Medicare HMO

## 2015-06-13 DIAGNOSIS — R918 Other nonspecific abnormal finding of lung field: Secondary | ICD-10-CM | POA: Diagnosis not present

## 2015-06-13 LAB — COMPREHENSIVE METABOLIC PANEL
ALT: 12 U/L (ref 0–55)
AST: 11 U/L (ref 5–34)
Albumin: 3.5 g/dL (ref 3.5–5.0)
Alkaline Phosphatase: 48 U/L (ref 40–150)
Anion Gap: 7 mEq/L (ref 3–11)
BUN: 13.4 mg/dL (ref 7.0–26.0)
CO2: 24 mEq/L (ref 22–29)
Calcium: 8.9 mg/dL (ref 8.4–10.4)
Chloride: 111 mEq/L — ABNORMAL HIGH (ref 98–109)
Creatinine: 1.1 mg/dL (ref 0.7–1.3)
EGFR: 77 mL/min/{1.73_m2} — ABNORMAL LOW (ref >90)
Glucose: 101 mg/dl (ref 70–140)
Potassium: 3.8 mEq/L (ref 3.5–5.1)
Sodium: 141 mEq/L (ref 136–145)
Total Bilirubin: 0.38 mg/dL (ref 0.20–1.20)
Total Protein: 6.5 g/dL (ref 6.4–8.3)

## 2015-06-13 LAB — CBC WITH DIFFERENTIAL/PLATELET
BASO%: 0.4 % (ref 0.0–2.0)
Basophils Absolute: 0 10*3/uL (ref 0.0–0.1)
EOS%: 1.8 % (ref 0.0–7.0)
Eosinophils Absolute: 0.1 10*3/uL (ref 0.0–0.5)
HCT: 43.5 % (ref 38.4–49.9)
HGB: 14.2 g/dL (ref 13.0–17.1)
LYMPH%: 38.2 % (ref 14.0–49.0)
MCH: 28.8 pg (ref 27.2–33.4)
MCHC: 32.6 g/dL (ref 32.0–36.0)
MCV: 88.5 fL (ref 79.3–98.0)
MONO#: 0.4 10*3/uL (ref 0.1–0.9)
MONO%: 10.3 % (ref 0.0–14.0)
NEUT#: 1.9 10*3/uL (ref 1.5–6.5)
NEUT%: 49.3 % (ref 39.0–75.0)
Platelets: 246 10*3/uL (ref 140–400)
RBC: 4.91 10*6/uL (ref 4.20–5.82)
RDW: 15.2 % — ABNORMAL HIGH (ref 11.0–14.6)
WBC: 3.8 10*3/uL — ABNORMAL LOW (ref 4.0–10.3)
lymph#: 1.5 10*3/uL (ref 0.9–3.3)

## 2015-06-13 NOTE — Progress Notes (Signed)
Pt got done with labs today but states that he has not been to chemo education class. According to Dr. [name], pt to reschedule 1st chemo treatment to 06/20/2015. Schedule pt to chemo class tomorrow and updated AVS schedule. Pt understands plan of care.

## 2015-06-14 ENCOUNTER — Ambulatory Visit
Admission: RE | Admit: 2015-06-14 | Discharge: 2015-06-14 | Disposition: A | Discharge status: Home / Self Care | Payer: Medicare HMO | Source: Ambulatory Visit | Attending: Radiation Oncology | Admitting: Radiation Oncology

## 2015-06-14 ENCOUNTER — Encounter: Payer: Self-pay | Admitting: *Deleted

## 2015-06-14 ENCOUNTER — Other Ambulatory Visit: Payer: Medicare HMO

## 2015-06-14 DIAGNOSIS — C3411 Malignant neoplasm of upper lobe, right bronchus or lung: Secondary | ICD-10-CM | POA: Diagnosis present

## 2015-06-14 DIAGNOSIS — Z510 Encounter for antineoplastic radiation therapy: Secondary | ICD-10-CM | POA: Insufficient documentation

## 2015-06-14 NOTE — Progress Notes (Signed)
Spent time with patient and his daughter in chemo class today .  See education notes

## 2015-06-15 ENCOUNTER — Ambulatory Visit
Admission: RE | Admit: 2015-06-15 | Discharge: 2015-06-15 | Disposition: A | Discharge status: Home / Self Care | Payer: Medicare HMO | Source: Ambulatory Visit | Attending: Internal Medicine | Admitting: Internal Medicine

## 2015-06-15 ENCOUNTER — Encounter (HOSPITAL_COMMUNITY): Payer: Self-pay

## 2015-06-15 ENCOUNTER — Telehealth: Payer: Self-pay | Admitting: Oncology

## 2015-06-15 ENCOUNTER — Telehealth: Payer: Self-pay | Admitting: *Deleted

## 2015-06-15 ENCOUNTER — Other Ambulatory Visit: Payer: Self-pay | Admitting: Internal Medicine

## 2015-06-15 DIAGNOSIS — C3411 Malignant neoplasm of upper lobe, right bronchus or lung: Secondary | ICD-10-CM

## 2015-06-15 DIAGNOSIS — N62 Hypertrophy of breast: Secondary | ICD-10-CM

## 2015-06-15 NOTE — Telephone Encounter (Signed)
He can do it now before chemo or wait until completion of his treatment.

## 2015-06-15 NOTE — Telephone Encounter (Signed)
Thank you. Informed office

## 2015-06-15 NOTE — Telephone Encounter (Signed)
Patient needs to receive deep cleaning, root canal, and possible extraction. Family member would like to know if its okay to receive these procedures due to chemotherapy expected date: 06/20/15. Family member would like to know today due to time constraint. Message forwarded to MD [facility].

## 2015-06-15 NOTE — Telephone Encounter (Signed)
Received a message from Alan [name], Alan [name]'s daughter.  She said Alan [name] needs a release from Dr. Sondra Come so that he can have dental work done before chemotherapy.  She requested that we call her mother, Alan [name] back.  Called Alan [name] and left a message requesting a return call.

## 2015-06-15 NOTE — Telephone Encounter (Signed)
Pt called with concerns regarding his dental procedures. Reviewed with Pt he may have the dental procedures before starting chemo or wait until chemo is completed. His dentist office has been informed. No further concerns.

## 2015-06-16 NOTE — Progress Notes (Signed)
  Radiation Oncology         [phone]) [phone] ________________________________  Name: Alan [name] MRN: [id number]  Date: 06/14/2015  DOB: 10-01-1940  SIMULATION AND TREATMENT PLANNING NOTE    ICD-9-CM ICD-10-CM   1. Malignant neoplasm of right upper lobe of lung ([place]) 162.3 C34.11     DIAGNOSIS:  Stage III-a Non-Small Cell Lung Cancer  NARRATIVE:  The patient was brought to the [facility].  Identity was confirmed.  All relevant records and images related to the planned course of therapy were reviewed.  The patient freely provided informed written consent to proceed with treatment after reviewing the details related to the planned course of therapy. The consent form was witnessed and verified by the simulation staff.  Then, the patient was set-up in a stable reproducible  supine position for radiation therapy.  CT images were obtained.  Surface markings were placed.  The CT images were loaded into the planning software.  Then the target and avoidance structures were contoured.  Treatment planning then occurred.  The radiation prescription was entered and confirmed.  Then, I designed and supervised the construction of a total of 5 medically necessary complex treatment devices.  I have requested : 3D Simulation  I have requested a DVH of the following structures: heart, lungs, GTV, PTV, spinal cord and esophagus.  I have ordered:dose calc.  PLAN:  The patient will receive 60 Gy in 30 fractions along with radiosensitizing chemotherapy  ________________________________ Special treatment procedure note:  The patient will be receiving radiosensitizing chemotherapy during the course of his chest radiation treatment. Given the increased potential for toxicities as well as the necessity for close monitoring of the patient and bloodwork, this constitutes a special treatment procedure -----------------------------------  Blair Promise, PhD, MD

## 2015-06-17 DIAGNOSIS — Z510 Encounter for antineoplastic radiation therapy: Secondary | ICD-10-CM | POA: Diagnosis not present

## 2015-06-20 ENCOUNTER — Ambulatory Visit (HOSPITAL_BASED_OUTPATIENT_CLINIC_OR_DEPARTMENT_OTHER): Payer: Medicare HMO

## 2015-06-20 ENCOUNTER — Ambulatory Visit
Admission: RE | Admit: 2015-06-20 | Discharge: 2015-06-20 | Disposition: A | Discharge status: Home / Self Care | Payer: Medicare HMO | Source: Ambulatory Visit | Attending: Radiation Oncology | Admitting: Radiation Oncology

## 2015-06-20 ENCOUNTER — Encounter: Payer: Self-pay | Admitting: Nurse Practitioner

## 2015-06-20 ENCOUNTER — Ambulatory Visit (HOSPITAL_BASED_OUTPATIENT_CLINIC_OR_DEPARTMENT_OTHER): Payer: Medicare HMO | Admitting: Nurse Practitioner

## 2015-06-20 ENCOUNTER — Other Ambulatory Visit (HOSPITAL_BASED_OUTPATIENT_CLINIC_OR_DEPARTMENT_OTHER): Payer: Medicare HMO

## 2015-06-20 VITALS — BP 159/87 | HR 73 | Temp 98.1°F | Resp 16

## 2015-06-20 DIAGNOSIS — Z5111 Encounter for antineoplastic chemotherapy: Secondary | ICD-10-CM

## 2015-06-20 DIAGNOSIS — R911 Solitary pulmonary nodule: Secondary | ICD-10-CM

## 2015-06-20 DIAGNOSIS — C3411 Malignant neoplasm of upper lobe, right bronchus or lung: Secondary | ICD-10-CM

## 2015-06-20 DIAGNOSIS — Z510 Encounter for antineoplastic radiation therapy: Secondary | ICD-10-CM | POA: Diagnosis not present

## 2015-06-20 DIAGNOSIS — N62 Hypertrophy of breast: Secondary | ICD-10-CM

## 2015-06-20 LAB — CBC WITH DIFFERENTIAL/PLATELET
BASO%: 0.9 % (ref 0.0–2.0)
Basophils Absolute: 0 10*3/uL (ref 0.0–0.1)
EOS%: 1.6 % (ref 0.0–7.0)
Eosinophils Absolute: 0.1 10*3/uL (ref 0.0–0.5)
HCT: 45.5 % (ref 38.4–49.9)
HGB: 14.7 g/dL (ref 13.0–17.1)
LYMPH#: 1.3 10*3/uL (ref 0.9–3.3)
LYMPH%: 34.9 % (ref 14.0–49.0)
MCH: 28.6 pg (ref 27.2–33.4)
MCHC: 32.3 g/dL (ref 32.0–36.0)
MCV: 88.4 fL (ref 79.3–98.0)
MONO#: 0.4 10*3/uL (ref 0.1–0.9)
MONO%: 9.4 % (ref 0.0–14.0)
NEUT%: 53.2 % (ref 39.0–75.0)
NEUTROS ABS: 2.1 10*3/uL (ref 1.5–6.5)
PLATELETS: 268 10*3/uL (ref 140–400)
RBC: 5.15 10*6/uL (ref 4.20–5.82)
RDW: 15.2 % — ABNORMAL HIGH (ref 11.0–14.6)
WBC: 3.9 10*3/uL — AB (ref 4.0–10.3)

## 2015-06-20 MED ORDER — SODIUM CHLORIDE 0.9 % IV SOLN
Freq: Once | INTRAVENOUS | Status: AC
  Administered 2015-06-20: 13:00:00 via INTRAVENOUS
  Filled 2015-06-20: qty 8

## 2015-06-20 MED ORDER — DIPHENHYDRAMINE HCL 50 MG/ML IJ SOLN
INTRAMUSCULAR | Status: AC
  Filled 2015-06-20: qty 1

## 2015-06-20 MED ORDER — PACLITAXEL CHEMO INJECTION 300 MG/50ML
45.0000 mg/m2 | Freq: Once | INTRAVENOUS | Status: AC
  Administered 2015-06-20: 84 mg via INTRAVENOUS
  Filled 2015-06-20: qty 14

## 2015-06-20 MED ORDER — DIPHENHYDRAMINE HCL 50 MG/ML IJ SOLN
50.0000 mg | Freq: Once | INTRAMUSCULAR | Status: AC
  Administered 2015-06-20: 50 mg via INTRAVENOUS

## 2015-06-20 MED ORDER — SODIUM CHLORIDE 0.9 % IV SOLN
191.4000 mg | Freq: Once | INTRAVENOUS | Status: AC
  Administered 2015-06-20: 190 mg via INTRAVENOUS
  Filled 2015-06-20: qty 19

## 2015-06-20 MED ORDER — SODIUM CHLORIDE 0.9 % IV SOLN
Freq: Once | INTRAVENOUS | Status: AC
  Administered 2015-06-20: 12:00:00 via INTRAVENOUS

## 2015-06-20 MED ORDER — FAMOTIDINE IN NACL 20-0.9 MG/50ML-% IV SOLN
INTRAVENOUS | Status: AC
  Filled 2015-06-20: qty 50

## 2015-06-20 MED ORDER — FAMOTIDINE IN NACL 20-0.9 MG/50ML-% IV SOLN
20.0000 mg | Freq: Once | INTRAVENOUS | Status: AC
  Administered 2015-06-20: 20 mg via INTRAVENOUS

## 2015-06-20 NOTE — Progress Notes (Signed)
[place] [phone] [phone] OFFICE PROGRESS NOTE   DIAGNOSIS: stage IIIA (T1b, N2, M0) non-small cell lung cancer, adenocarcinoma presented with right right upper lobe nodule in addition to mediastinal lymphadenopathy diagnosed in November 2016.  PRIOR THERAPY:  1) none.  CURRENT THERAPY: concurrent chemoradiation with weekly carboplatin for AUC 2 and paclitaxel 45 MG/M2. First dose 06/20/15  INTERVAL HISTORY: Alan [name] 74 y.o. male returns for follow up of his non-small cell lung cancer. The start of chemotherapy was delayed due to pending dental work. Unfortunately, this could not be coordinated in a timely manner, so he is opting to delay the dental work in order to start chemotherapy today. He is to start weekly carboplatin and paclitaxel with concurrent radiation. He denies fevers, chills, nausea, or vomiting. He alternates linzess and lactulose for his chronic constipation. He is on flomax daily for BPH. He is in no pain today. His upper gums are tender, but he has no active lesions in his mouth or tooth pain. He is short of breath with exertion and cold weather, but denies chest pain or palpitations. He denies hemoptysis., but has a mild cough. his fatigue is minimal. He denies weight loss or night sweats. There are no headaches, dizziness, or vision changes.   HPI  Past Medical History  Diagnosis Date  . Sleep apnea     does not use cpap  . Unspecified essential hypertension   . Hypercholesteremia   . GERD (gastroesophageal reflux disease)   . Diverticulosis of colon (without mention of hemorrhage)   . Irritable bowel syndrome   . Benign neoplasm of colon     colon polyps  . Benign hypertrophy of prostate   . Erectile dysfunction   . Restless legs syndrome (RLS)   . Anxiety state, unspecified   . Onychomycosis   . Constipation, chronic   . Hypothyroidism   . Cancer [facility])     prostate cancer  . Glaucoma     Past  Surgical History  Procedure Laterality Date  . None    . Colonoscopy    . Laparoscopic appendectomy N/A 01/27/2013    Procedure: APPENDECTOMY LAPAROSCOPIC;  Surgeon: Edward Jolly, MD;  Location: [facility];  Service: General;  Laterality: N/A;  . Appendectomy  12/2012  . Thyroidectomy    . Tonsillectomy    . Cardiac catheterization  ? 2008  . Video bronchoscopy N/A 05/31/2015    Procedure: VIDEO BRONCHOSCOPY;  Surgeon: Gaye Pollack, MD;  Location: [place];  Service: Thoracic;  Laterality: N/A;  . Mediastinoscopy N/A 05/31/2015    Procedure: MEDIASTINOSCOPY;  Surgeon: Gaye Pollack, MD;  Location: [facility] OR;  Service: Thoracic;  Laterality: N/A;    Family History  Problem Relation Age of Onset  . Colon cancer Paternal Grandfather 55  . Cancer Paternal Grandfather     prostate  . Cancer Father     prostate  . Cancer Brother     prostate    Social History Social History  Substance Use Topics  . Smoking status: Former Smoker -- 2.00 packs/day for 15 years    Types: Cigarettes    Quit date: 07/02/1968  . Smokeless tobacco: Former Systems developer    Types: Chew, Snuff  . Alcohol Use: No    Allergies  Allergen Reactions  . Codeine Other (See Comments)    : hallucinations,faint,dizziness    Current Outpatient Prescriptions  Medication Sig Dispense Refill  . amLODipine (NORVASC) 5 MG tablet TAKE ONE TABLET  BY MOUTH IN THE MORNING 90 tablet 0  . aspirin EC 81 MG tablet Take 81 mg by mouth daily.     . brimonidine-timolol (COMBIGAN) 0.2-0.5 % ophthalmic solution Place 1 drop into both eyes every 12 (twelve) hours.    . finasteride ([name]) 5 MG tablet Take 5 mg by mouth daily.    . fluticasone (FLONASE) 50 MCG/ACT nasal spray Place 2 sprays into both nostrils daily. (Patient taking differently: Place 2 sprays into both nostrils daily as needed for allergies or rhinitis. ) 48 g 3  . lactulose (CHRONULAC) 10 GM/15ML solution Take 10 g by mouth 2 (two) times daily as needed (constipation).      [name] levothyroxine (SYNTHROID, LEVOTHROID) 88 MCG tablet Take 1 tablet (88 mcg total) by mouth daily. (Patient taking differently: Take 88 mcg by mouth daily before breakfast. ) 45 tablet 2  . Linaclotide (LINZESS) 290 MCG CAPS capsule Take 1 capsule (290 mcg total) by mouth daily. 30 capsule 3  . omeprazole (PRILOSEC) 20 MG capsule Take 1 capsule (20 mg total) by mouth every morning. (Patient taking differently: Take 20 mg by mouth daily before breakfast. ) 90 capsule 3  . sildenafil (VIAGRA) 100 MG tablet Take 1 tablet (100 mg total) by mouth as directed. (Patient taking differently: Take 100 mg by mouth daily as needed for erectile dysfunction. ) 10 tablet 0  . simvastatin (ZOCOR) 20 MG tablet TAKE ONE TABLET BY MOUTH AT BEDTIME 90 tablet 0  . tamsulosin (FLOMAX) 0.4 MG CAPS capsule Take 1 capsule (0.4 mg total) by mouth every evening. (Patient taking differently: Take 0.8 mg by mouth at bedtime. ) 90 capsule 3  . polyethylene glycol (MIRALAX / GLYCOLAX) packet Take 17 g by mouth daily as needed (constipation). Reported on 06/20/2015    . prochlorperazine (COMPAZINE) 10 MG tablet Take 1 tablet (10 mg total) by mouth every 6 (six) hours as needed for nausea or vomiting. (Patient not taking: Reported on 06/20/2015) 30 tablet 0  . traMADol (ULTRAM) 50 MG tablet Take 1 tablet (50 mg total) by mouth every 6 (six) hours as needed for moderate pain. (Patient not taking: Reported on 06/08/2015) 20 tablet 0   No current facility-administered medications for this visit.    Review of Systems  Constitutional: positive for fatigue Eyes: negative Ears, nose, mouth, throat, and face: negative Respiratory: positive for cough Cardiovascular: negative Gastrointestinal: negative Genitourinary:negative Integument/breast: negative Hematologic/lymphatic: negative Musculoskeletal:negative Neurological: negative Behavioral/Psych: negative Endocrine: negative Allergic/Immunologic: negative  Physical  Exam  XTK:WIOXB, healthy, no distress, well nourished and well developed SKIN: skin color, texture, turgor are normal, no rashes or significant lesions HEAD: Normocephalic, No masses, lesions, tenderness or abnormalities EYES: normal, PERRLA EARS: External ears normal, Canals clear OROPHARYNX:no exudate, no erythema and lips, buccal mucosa, and tongue normal  NECK: supple, no adenopathy, no JVD LYMPH:  no palpable lymphadenopathy, no hepatosplenomegaly BREAST: Enlargement of the left breast with no palpable masses LUNGS: clear to auscultation , and palpation HEART: regular rate & rhythm, no murmurs and no gallops ABDOMEN:abdomen soft, non-tender, normal bowel sounds and no masses or organomegaly BACK: Back symmetric, no curvature., No CVA tenderness EXTREMITIES:no joint deformities, effusion, or inflammation, no edema, no skin discoloration  NEURO: alert & oriented x 3 with fluent speech, no focal motor/sensory deficits  PERFORMANCE STATUS: ECOG 1  LABORATORY DATA: Lab Results  Component Value Date   WBC 3.9* 06/20/2015   HGB 14.7 06/20/2015   HCT 45.5 06/20/2015   MCV 88.4 06/20/2015  PLT 268 06/20/2015      Chemistry      Component Value Date/Time   NA 141 06/13/2015 1105   NA 142 05/31/2015 0645   K 3.8 06/13/2015 1105   K 3.7 05/31/2015 0645   CL 110 05/31/2015 0645   CO2 24 06/13/2015 1105   CO2 25 05/31/2015 0645   BUN 13.4 06/13/2015 1105   BUN 8 05/31/2015 0645   CREATININE 1.1 06/13/2015 1105   CREATININE 1.03 05/31/2015 0645      Component Value Date/Time   CALCIUM 8.9 06/13/2015 1105   CALCIUM 9.1 05/31/2015 0645   ALKPHOS 48 06/13/2015 1105   ALKPHOS 42 05/31/2015 0645   AST 11 06/13/2015 1105   AST 15 05/31/2015 0645   ALT 12 06/13/2015 1105   ALT 17 05/31/2015 0645   BILITOT 0.38 06/13/2015 1105   BILITOT 0.4 05/31/2015 0645       RADIOGRAPHIC STUDIES: Dg Chest 2 View  05/31/2015  CLINICAL DATA:  Lung mass. EXAM: CHEST  2 VIEW COMPARISON:   05/19/2015 PET scan FINDINGS: The right upper lobe mass is clearly visible, projecting between the right first and second anterior ribs. The lungs are otherwise clear, with no acute abnormalities. Pulmonary vasculature is normal. No pleural effusions. Heart size is normal. IMPRESSION: Right upper lobe nodule.  No acute cardiopulmonary findings. Electronically Signed   By: Andreas Newport M.D.   On: 05/31/2015 07:06   Dg Chest Port 1 View  05/31/2015  CLINICAL DATA:  Post biopsy for right lung mass EXAM: PORTABLE CHEST 1 VIEW COMPARISON:  Study obtained earlier in the day FINDINGS: There is a nodular lesion in the right upper lobe measuring 2.8 x 2.8 cm. Lungs elsewhere clear. Heart size and pulmonary vascular normal. No adenopathy. No pneumothorax. No bone lesions. There is calcification in the aortic arch region. IMPRESSION: No change in right upper lobe nodular lesion. No new opacity. No pneumothorax. No change in cardiac silhouette. Electronically Signed   By: Lowella Grip III M.D.   On: 05/31/2015 10:08   Mm Digital Diagnostic Bilat  06/15/2015  CLINICAL DATA:  Left breast swelling for 6-7 months. EXAM: DIGITAL DIAGNOSTIC BILATERAL MAMMOGRAM WITH CAD COMPARISON:  Previous exam(s). ACR Breast Density Category c: The breast tissue is heterogeneously dense, which may obscure small masses. FINDINGS: There are no suspicious masses, areas of architectural distortion or microcalcifications in either breast. There is an asymmetric breast tissue development in the subareolar left breast, consistent with gynecomastia. No suspicious masses are seen. Mammographic images were processed with CAD. IMPRESSION: Asymmetric left breast gynecomastia. RECOMMENDATION: Clinical follow-up for asymmetric gynecomastia. I have discussed the findings and recommendations with the patient. Results were also provided in writing at the conclusion of the visit. If applicable, a reminder letter will be sent to the patient  regarding the next appointment. BI-RADS CATEGORY  2: Benign Finding(s) Electronically Signed   By: Fidela Salisbury M.D.   On: 06/15/2015 11:04    ASSESSMENT AND PLAN: This is a very pleasant 74 years old African-American male recently diagnosed with a stage IIIA (T1b, N2, M0) non-small cell lung cancer, adenocarcinoma presented with right right upper lobe nodule in addition to mediastinal lymphadenopathy diagnosed in November 2016. The patient also has small pulmonary nodules that need close observation. He also has enlargement of the left breast suspicious for gynecomastia versus breast carcinoma. He is to begin cycle #1 of weekly carboplatin for AUC 2 and paclitaxel 45 MG/M2 with concurrent radiation today The labs were reviewed  in detail and were appropriate for treatment. The patient was discussed with Dr. [name]. I advised him to wait until after the completion of chemo and radiation to proceed with any dental work including deep cleaning.  We reviewed the use of compazine for nausea PRN.  He will return in 1 week for follow up and cycle #2 of treatment with Dr. [name].   All questions were answered. The patient knows to call the clinic with any problems, questions or concerns. We can certainly see the patient much sooner if necessary.  Laurie Panda, NP 06/20/2015 12:10 PM

## 2015-06-20 NOTE — Patient Instructions (Addendum)
[facility] Discharge Instructions for Patients Receiving Chemotherapy  Today you received the following chemotherapy agents:  Taxol and Carboplatin  To help prevent nausea and vomiting after your treatment, we encourage you to take your nausea medication as ordered per MD.   If you develop nausea and vomiting that is not controlled by your nausea medication, call the clinic.   BELOW ARE SYMPTOMS THAT SHOULD BE REPORTED IMMEDIATELY:  *FEVER GREATER THAN 100.5 F  *CHILLS WITH OR WITHOUT FEVER  NAUSEA AND VOMITING THAT IS NOT CONTROLLED WITH YOUR NAUSEA MEDICATION  *UNUSUAL SHORTNESS OF BREATH  *UNUSUAL BRUISING OR BLEEDING  TENDERNESS IN MOUTH AND THROAT WITH OR WITHOUT PRESENCE OF ULCERS  *URINARY PROBLEMS  *BOWEL PROBLEMS  UNUSUAL RASH Items with * indicate a potential emergency and should be followed up as soon as possible.  Feel free to call the clinic you have any questions or concerns. The clinic phone number is [phone].  Please show the [facility] at check-in to the Emergency Department and triage nurse.  Paclitaxel injection What is this medicine? PACLITAXEL (PAK li TAX el) is a chemotherapy drug. It targets fast dividing cells, like cancer cells, and causes these cells to die. This medicine is used to treat ovarian cancer, breast cancer, and other cancers. This medicine may be used for other purposes; ask your health care provider or pharmacist if you have questions. What should I tell my health care provider before I take this medicine? They need to know if you have any of these conditions: -blood disorders -irregular heartbeat -infection (especially a virus infection such as chickenpox, cold sores, or herpes) -liver disease -previous or ongoing radiation therapy -an unusual or allergic reaction to paclitaxel, alcohol, polyoxyethylated castor oil, other chemotherapy agents, other medicines, foods, dyes, or preservatives -pregnant or  trying to get pregnant -breast-feeding How should I use this medicine? This drug is given as an infusion into a vein. It is administered in a hospital or clinic by a specially trained health care professional. Talk to your pediatrician regarding the use of this medicine in children. Special care may be needed. Overdosage: If you think you have taken too much of this medicine contact a poison control center or emergency room at once. NOTE: This medicine is only for you. Do not share this medicine with others. What if I miss a dose? It is important not to miss your dose. Call your doctor or health care professional if you are unable to keep an appointment. What may interact with this medicine? Do not take this medicine with any of the following medications: -disulfiram -metronidazole This medicine may also interact with the following medications: -cyclosporine -diazepam -ketoconazole -medicines to increase blood counts like filgrastim, pegfilgrastim, sargramostim -other chemotherapy drugs like cisplatin, doxorubicin, epirubicin, etoposide, teniposide, vincristine -quinidine -testosterone -vaccines -verapamil Talk to your doctor or health care professional before taking any of these medicines: -acetaminophen -aspirin -ibuprofen -ketoprofen -naproxen This list may not describe all possible interactions. Give your health care provider a list of all the medicines, herbs, non-prescription drugs, or dietary supplements you use. Also tell them if you smoke, drink alcohol, or use illegal drugs. Some items may interact with your medicine. What should I watch for while using this medicine? Your condition will be monitored carefully while you are receiving this medicine. You will need important blood work done while you are taking this medicine. This drug may make you feel generally unwell. This is not uncommon, as chemotherapy can affect healthy  cells as well as cancer cells. Report any side  effects. Continue your course of treatment even though you feel ill unless your doctor tells you to stop. This medicine can cause serious allergic reactions. To reduce your risk you will need to take other medicine(s) before treatment with this medicine. In some cases, you may be given additional medicines to help with side effects. Follow all directions for their use. Call your doctor or health care professional for advice if you get a fever, chills or sore throat, or other symptoms of a cold or flu. Do not treat yourself. This drug decreases your body's ability to fight infections. Try to avoid being around people who are sick. This medicine may increase your risk to bruise or bleed. Call your doctor or health care professional if you notice any unusual bleeding. Be careful brushing and flossing your teeth or using a toothpick because you may get an infection or bleed more easily. If you have any dental work done, tell your dentist you are receiving this medicine. Avoid taking products that contain aspirin, acetaminophen, ibuprofen, naproxen, or ketoprofen unless instructed by your doctor. These medicines may hide a fever. Do not become pregnant while taking this medicine. Women should inform their doctor if they wish to become pregnant or think they might be pregnant. There is a potential for serious side effects to an unborn child. Talk to your health care professional or pharmacist for more information. Do not breast-feed an infant while taking this medicine. Men are advised not to father a child while receiving this medicine. This product may contain alcohol. Ask your pharmacist or healthcare provider if this medicine contains alcohol. Be sure to tell all healthcare providers you are taking this medicine. Certain medicines, like metronidazole and disulfiram, can cause an unpleasant reaction when taken with alcohol. The reaction includes flushing, headache, nausea, vomiting, sweating, and increased  thirst. The reaction can last from 30 minutes to several hours. What side effects may I notice from receiving this medicine? Side effects that you should report to your doctor or health care professional as soon as possible: -allergic reactions like skin rash, itching or hives, swelling of the face, lips, or tongue -low blood counts - This drug may decrease the number of white blood cells, red blood cells and platelets. You may be at increased risk for infections and bleeding. -signs of infection - fever or chills, cough, sore throat, pain or difficulty passing urine -signs of decreased platelets or bleeding - bruising, pinpoint red spots on the skin, black, tarry stools, nosebleeds -signs of decreased red blood cells - unusually weak or tired, fainting spells, lightheadedness -breathing problems -chest pain -high or low blood pressure -mouth sores -nausea and vomiting -pain, swelling, redness or irritation at the injection site -pain, tingling, numbness in the hands or feet -slow or irregular heartbeat -swelling of the ankle, feet, hands Side effects that usually do not require medical attention (report to your doctor or health care professional if they continue or are bothersome): -bone pain -complete hair loss including hair on your head, underarms, pubic hair, eyebrows, and eyelashes -changes in the color of fingernails -diarrhea -loosening of the fingernails -loss of appetite -muscle or joint pain -red flush to skin -sweating This list may not describe all possible side effects. Call your doctor for medical advice about side effects. You may report side effects to FDA at 1-800-FDA-1088. Where should I keep my medicine? This drug is given in a hospital or clinic and will not  be stored at home. NOTE: This sheet is a summary. It may not cover all possible information. If you have questions about this medicine, talk to your doctor, pharmacist, or health care provider.    2016,  [employer]/Gold Standard. (2015-02-03 13:02:56)   Carboplatin injection What is this medicine? CARBOPLATIN (KAR boe pla tin) is a chemotherapy drug. It targets fast dividing cells, like cancer cells, and causes these cells to die. This medicine is used to treat ovarian cancer and many other cancers. This medicine may be used for other purposes; ask your health care provider or pharmacist if you have questions. What should I tell my health care provider before I take this medicine? They need to know if you have any of these conditions: -blood disorders -hearing problems -kidney disease -recent or ongoing radiation therapy -an unusual or allergic reaction to carboplatin, cisplatin, other chemotherapy, other medicines, foods, dyes, or preservatives -pregnant or trying to get pregnant -breast-feeding How should I use this medicine? This drug is usually given as an infusion into a vein. It is administered in a hospital or clinic by a specially trained health care professional. Talk to your pediatrician regarding the use of this medicine in children. Special care may be needed. Overdosage: If you think you have taken too much of this medicine contact a poison control center or emergency room at once. NOTE: This medicine is only for you. Do not share this medicine with others. What if I miss a dose? It is important not to miss a dose. Call your doctor or health care professional if you are unable to keep an appointment. What may interact with this medicine? -medicines for seizures -medicines to increase blood counts like filgrastim, pegfilgrastim, sargramostim -some antibiotics like amikacin, gentamicin, neomycin, streptomycin, tobramycin -vaccines Talk to your doctor or health care professional before taking any of these medicines: -acetaminophen -aspirin -ibuprofen -ketoprofen -naproxen This list may not describe all possible interactions. Give your health care provider a list of all the  medicines, herbs, non-prescription drugs, or dietary supplements you use. Also tell them if you smoke, drink alcohol, or use illegal drugs. Some items may interact with your medicine. What should I watch for while using this medicine? Your condition will be monitored carefully while you are receiving this medicine. You will need important blood work done while you are taking this medicine. This drug may make you feel generally unwell. This is not uncommon, as chemotherapy can affect healthy cells as well as cancer cells. Report any side effects. Continue your course of treatment even though you feel ill unless your doctor tells you to stop. In some cases, you may be given additional medicines to help with side effects. Follow all directions for their use. Call your doctor or health care professional for advice if you get a fever, chills or sore throat, or other symptoms of a cold or flu. Do not treat yourself. This drug decreases your body's ability to fight infections. Try to avoid being around people who are sick. This medicine may increase your risk to bruise or bleed. Call your doctor or health care professional if you notice any unusual bleeding. Be careful brushing and flossing your teeth or using a toothpick because you may get an infection or bleed more easily. If you have any dental work done, tell your dentist you are receiving this medicine. Avoid taking products that contain aspirin, acetaminophen, ibuprofen, naproxen, or ketoprofen unless instructed by your doctor. These medicines may hide a fever. Do not become pregnant  while taking this medicine. Women should inform their doctor if they wish to become pregnant or think they might be pregnant. There is a potential for serious side effects to an unborn child. Talk to your health care professional or pharmacist for more information. Do not breast-feed an infant while taking this medicine. What side effects may I notice from receiving this  medicine? Side effects that you should report to your doctor or health care professional as soon as possible: -allergic reactions like skin rash, itching or hives, swelling of the face, lips, or tongue -signs of infection - fever or chills, cough, sore throat, pain or difficulty passing urine -signs of decreased platelets or bleeding - bruising, pinpoint red spots on the skin, black, tarry stools, nosebleeds -signs of decreased red blood cells - unusually weak or tired, fainting spells, lightheadedness -breathing problems -changes in hearing -changes in vision -chest pain -high blood pressure -low blood counts - This drug may decrease the number of white blood cells, red blood cells and platelets. You may be at increased risk for infections and bleeding. -nausea and vomiting -pain, swelling, redness or irritation at the injection site -pain, tingling, numbness in the hands or feet -problems with balance, talking, walking -trouble passing urine or change in the amount of urine Side effects that usually do not require medical attention (report to your doctor or health care professional if they continue or are bothersome): -hair loss -loss of appetite -metallic taste in the mouth or changes in taste This list may not describe all possible side effects. Call your doctor for medical advice about side effects. You may report side effects to FDA at 1-800-FDA-1088. Where should I keep my medicine? This drug is given in a hospital or clinic and will not be stored at home. NOTE: This sheet is a summary. It may not cover all possible information. If you have questions about this medicine, talk to your doctor, pharmacist, or health care provider.    2016, [employer]/Gold Standard. (2007-09-23 14:38:05)

## 2015-06-21 ENCOUNTER — Ambulatory Visit (HOSPITAL_COMMUNITY)
Admission: RE | Admit: 2015-06-21 | Discharge: 2015-06-21 | Disposition: A | Discharge status: Home / Self Care | Payer: Medicare HMO | Source: Ambulatory Visit | Attending: Internal Medicine | Admitting: Internal Medicine

## 2015-06-21 ENCOUNTER — Ambulatory Visit
Admission: RE | Admit: 2015-06-21 | Discharge: 2015-06-21 | Disposition: A | Discharge status: Home / Self Care | Payer: Medicare HMO | Source: Ambulatory Visit | Attending: Radiation Oncology | Admitting: Radiation Oncology

## 2015-06-21 ENCOUNTER — Encounter: Payer: Self-pay | Admitting: Radiation Oncology

## 2015-06-21 ENCOUNTER — Telehealth: Payer: Self-pay | Admitting: *Deleted

## 2015-06-21 VITALS — BP 139/76 | HR 103 | Temp 97.7°F | Resp 18 | Ht 66.0 in | Wt 177.9 lb

## 2015-06-21 DIAGNOSIS — Z510 Encounter for antineoplastic radiation therapy: Secondary | ICD-10-CM | POA: Diagnosis not present

## 2015-06-21 DIAGNOSIS — N62 Hypertrophy of breast: Secondary | ICD-10-CM

## 2015-06-21 DIAGNOSIS — R590 Localized enlarged lymph nodes: Secondary | ICD-10-CM | POA: Insufficient documentation

## 2015-06-21 DIAGNOSIS — C3411 Malignant neoplasm of upper lobe, right bronchus or lung: Secondary | ICD-10-CM

## 2015-06-21 MED ORDER — RADIAPLEXRX EX GEL
Freq: Once | CUTANEOUS | Status: AC
  Administered 2015-06-21: 10:00:00 via TOPICAL

## 2015-06-21 MED ORDER — GADOBENATE DIMEGLUMINE 529 MG/ML IV SOLN
20.0000 mL | Freq: Once | INTRAVENOUS | Status: AC | PRN
  Administered 2015-06-21: 16 mL via INTRAVENOUS

## 2015-06-21 NOTE — Progress Notes (Signed)
Alan [name] has completed 2 fractions to his right chest.  He denies pain.  He reports shortness of breath with activity.  He reports having a cough with white sputum.  He had chemotherapy yesterday.  BP 139/76 mmHg  Pulse 103  Temp(Src) 97.7 F (36.5 C) (Oral)  Resp 18  Ht '5\' 6"'$  (1.676 m)  Wt 177 lb 14.4 oz (80.695 kg)  BMI 28.73 kg/m2  SpO2 100%

## 2015-06-21 NOTE — Telephone Encounter (Signed)
-----   Message from Cheree Ditto, RN sent at 06/20/2015  3:08 PM EST ----- Regarding: 1st time chemo follow up; Dr. Carollee Herter: [phone] 1st time Taxol/Carbo.  Pt tolerated well. Dr. [name] pt.

## 2015-06-21 NOTE — Progress Notes (Signed)
Pt here for patient teaching.  Pt given Radiation and You booklet and Radiaplex gel. Pt reports they have not watched the Radiation Therapy Education video and has been given the link to watch it at home.  Reviewed areas of pertinence such as fatigue, skin changes and throat changes . Pt able to give teach back of to pat skin and use unscented/gentle soap,apply Radiaplex bid and avoid applying anything to skin within 4 hours of treatment. Pt demonstrated understanding and verbalizes understanding of information given and will contact nursing with any questions or concerns.     [email or web address]

## 2015-06-21 NOTE — Progress Notes (Signed)
  Radiation Oncology         [phone] ________________________________  Name: QUANTRELL SPLITT MRN: [id number]  Date: 06/21/2015  DOB: 1941-04-02  Weekly Radiation Therapy Management    ICD-9-CM ICD-10-CM   1. Malignant neoplasm of right upper lobe of lung (HCC) 162.3 C34.11 hyaluronate sodium (RADIAPLEXRX) gel     Current Dose: 4 Gy     Planned Dose:  60 Gy  Narrative . . . . . . . . The patient presents for routine under treatment assessment.                                   The patient is without complaint.  chemotherapy yesterday and also tolerated this well. He has a mild cough but no fever                                 Set-up films were reviewed.                                 The chart was checked. Physical Findings. . .  height is '5\' 6"'$  (1.676 m) and weight is 177 lb 14.4 oz (80.695 kg). His oral temperature is 97.7 F (36.5 C). His blood pressure is 139/76 and his pulse is 103. His respiration is 18 and oxygen saturation is 100%. . Weight essentially stable.  No significant changes. The lungs are clear. The heart has a regular rhythm and rate Impression . . . . . . . The patient is tolerating radiation. Plan . . . . . . . . . . . . Continue treatment as planned.  ________________________________   Blair Promise, PhD, MD

## 2015-06-21 NOTE — Telephone Encounter (Signed)
Follow up call placed. Unable to reach, lmovm for pt to call office with any concerns.

## 2015-06-22 ENCOUNTER — Ambulatory Visit
Admission: RE | Admit: 2015-06-22 | Discharge: 2015-06-22 | Disposition: A | Discharge status: Home / Self Care | Payer: Medicare HMO | Source: Ambulatory Visit | Attending: Radiation Oncology | Admitting: Radiation Oncology

## 2015-06-22 ENCOUNTER — Other Ambulatory Visit: Payer: Self-pay | Admitting: Pulmonary Disease

## 2015-06-22 DIAGNOSIS — Z510 Encounter for antineoplastic radiation therapy: Secondary | ICD-10-CM | POA: Diagnosis not present

## 2015-06-23 ENCOUNTER — Ambulatory Visit
Admission: RE | Admit: 2015-06-23 | Discharge: 2015-06-23 | Disposition: A | Discharge status: Home / Self Care | Payer: Medicare HMO | Source: Ambulatory Visit | Attending: Radiation Oncology | Admitting: Radiation Oncology

## 2015-06-23 ENCOUNTER — Other Ambulatory Visit: Payer: Self-pay | Admitting: *Deleted

## 2015-06-23 DIAGNOSIS — Z510 Encounter for antineoplastic radiation therapy: Secondary | ICD-10-CM | POA: Diagnosis not present

## 2015-06-23 MED ORDER — AMLODIPINE BESYLATE 5 MG PO TABS: 5.0000 mg | ORAL_TABLET | Freq: Every morning | ORAL | Status: DC

## 2015-06-24 ENCOUNTER — Ambulatory Visit
Admission: RE | Admit: 2015-06-24 | Discharge: 2015-06-24 | Disposition: A | Discharge status: Home / Self Care | Payer: Medicare HMO | Source: Ambulatory Visit | Attending: Radiation Oncology | Admitting: Radiation Oncology

## 2015-06-24 ENCOUNTER — Encounter (HOSPITAL_COMMUNITY): Payer: Self-pay

## 2015-06-24 DIAGNOSIS — Z510 Encounter for antineoplastic radiation therapy: Secondary | ICD-10-CM | POA: Diagnosis not present

## 2015-06-28 ENCOUNTER — Ambulatory Visit
Admission: RE | Admit: 2015-06-28 | Discharge: 2015-06-28 | Disposition: A | Discharge status: Home / Self Care | Payer: Medicare HMO | Source: Ambulatory Visit | Attending: Radiation Oncology | Admitting: Radiation Oncology

## 2015-06-28 ENCOUNTER — Telehealth: Payer: Self-pay | Admitting: Internal Medicine

## 2015-06-28 ENCOUNTER — Ambulatory Visit (HOSPITAL_BASED_OUTPATIENT_CLINIC_OR_DEPARTMENT_OTHER): Payer: Medicare HMO | Admitting: Internal Medicine

## 2015-06-28 ENCOUNTER — Other Ambulatory Visit (HOSPITAL_BASED_OUTPATIENT_CLINIC_OR_DEPARTMENT_OTHER): Payer: Medicare HMO

## 2015-06-28 ENCOUNTER — Encounter: Payer: Self-pay | Admitting: Internal Medicine

## 2015-06-28 ENCOUNTER — Ambulatory Visit (HOSPITAL_BASED_OUTPATIENT_CLINIC_OR_DEPARTMENT_OTHER): Payer: Medicare HMO

## 2015-06-28 ENCOUNTER — Encounter: Payer: Self-pay | Admitting: Radiation Oncology

## 2015-06-28 ENCOUNTER — Other Ambulatory Visit: Payer: Self-pay | Admitting: *Deleted

## 2015-06-28 ENCOUNTER — Encounter: Payer: Self-pay | Admitting: *Deleted

## 2015-06-28 VITALS — BP 137/79 | HR 100 | Temp 97.6°F | Resp 18 | Ht 66.0 in | Wt 178.0 lb

## 2015-06-28 VITALS — BP 132/73 | HR 105 | Temp 97.6°F | Resp 20 | Wt 178.1 lb

## 2015-06-28 DIAGNOSIS — C3411 Malignant neoplasm of upper lobe, right bronchus or lung: Secondary | ICD-10-CM

## 2015-06-28 DIAGNOSIS — Z510 Encounter for antineoplastic radiation therapy: Secondary | ICD-10-CM | POA: Diagnosis not present

## 2015-06-28 DIAGNOSIS — Z5111 Encounter for antineoplastic chemotherapy: Secondary | ICD-10-CM | POA: Diagnosis not present

## 2015-06-28 LAB — COMPREHENSIVE METABOLIC PANEL
ALT: 13 U/L (ref 0–55)
ANION GAP: 8 meq/L (ref 3–11)
AST: 14 U/L (ref 5–34)
Albumin: 3.7 g/dL (ref 3.5–5.0)
Alkaline Phosphatase: 47 U/L (ref 40–150)
BUN: 16.1 mg/dL (ref 7.0–26.0)
CALCIUM: 9.3 mg/dL (ref 8.4–10.4)
CO2: 24 mEq/L (ref 22–29)
CREATININE: 1 mg/dL (ref 0.7–1.3)
Chloride: 107 mEq/L (ref 98–109)
EGFR: 81 mL/min/{1.73_m2} — ABNORMAL LOW (ref >90)
Glucose: 94 mg/dl (ref 70–140)
Potassium: 4.1 mEq/L (ref 3.5–5.1)
Sodium: 139 mEq/L (ref 136–145)
TOTAL PROTEIN: 6.9 g/dL (ref 6.4–8.3)

## 2015-06-28 LAB — CBC WITH DIFFERENTIAL/PLATELET
BASO%: 0.5 % (ref 0.0–2.0)
Basophils Absolute: 0 10*3/uL (ref 0.0–0.1)
EOS%: 1.3 % (ref 0.0–7.0)
Eosinophils Absolute: 0 10*3/uL (ref 0.0–0.5)
HEMATOCRIT: 43.6 % (ref 38.4–49.9)
HEMOGLOBIN: 14 g/dL (ref 13.0–17.1)
LYMPH#: 1 10*3/uL (ref 0.9–3.3)
LYMPH%: 28 % (ref 14.0–49.0)
MCH: 28.5 pg (ref 27.2–33.4)
MCHC: 32.1 g/dL (ref 32.0–36.0)
MCV: 88.9 fL (ref 79.3–98.0)
MONO#: 0.3 10*3/uL (ref 0.1–0.9)
MONO%: 9.6 % (ref 0.0–14.0)
NEUT%: 60.6 % (ref 39.0–75.0)
NEUTROS ABS: 2.1 10*3/uL (ref 1.5–6.5)
PLATELETS: 240 10*3/uL (ref 140–400)
RBC: 4.9 10*6/uL (ref 4.20–5.82)
RDW: 14.9 % — ABNORMAL HIGH (ref 11.0–14.6)
WBC: 3.5 10*3/uL — AB (ref 4.0–10.3)

## 2015-06-28 MED ORDER — SODIUM CHLORIDE 0.9 % IV SOLN
Freq: Once | INTRAVENOUS | Status: AC
  Administered 2015-06-28: 14:00:00 via INTRAVENOUS

## 2015-06-28 MED ORDER — SODIUM CHLORIDE 0.9 % IV SOLN
Freq: Once | INTRAVENOUS | Status: AC
  Administered 2015-06-28: 15:00:00 via INTRAVENOUS
  Filled 2015-06-28: qty 8

## 2015-06-28 MED ORDER — FAMOTIDINE IN NACL 20-0.9 MG/50ML-% IV SOLN
20.0000 mg | Freq: Once | INTRAVENOUS | Status: AC
  Administered 2015-06-28: 20 mg via INTRAVENOUS

## 2015-06-28 MED ORDER — PACLITAXEL CHEMO INJECTION 300 MG/50ML
45.0000 mg/m2 | Freq: Once | INTRAVENOUS | Status: AC
  Administered 2015-06-28: 84 mg via INTRAVENOUS
  Filled 2015-06-28: qty 14

## 2015-06-28 MED ORDER — DIPHENHYDRAMINE HCL 50 MG/ML IJ SOLN
INTRAMUSCULAR | Status: AC
  Filled 2015-06-28: qty 1

## 2015-06-28 MED ORDER — DIPHENHYDRAMINE HCL 50 MG/ML IJ SOLN
50.0000 mg | Freq: Once | INTRAMUSCULAR | Status: AC
  Administered 2015-06-28: 50 mg via INTRAVENOUS

## 2015-06-28 MED ORDER — SODIUM CHLORIDE 0.9 % IV SOLN
191.4000 mg | Freq: Once | INTRAVENOUS | Status: AC
  Administered 2015-06-28: 190 mg via INTRAVENOUS
  Filled 2015-06-28: qty 19

## 2015-06-28 MED ORDER — FAMOTIDINE IN NACL 20-0.9 MG/50ML-% IV SOLN
INTRAVENOUS | Status: AC
  Filled 2015-06-28: qty 50

## 2015-06-28 NOTE — Progress Notes (Signed)
Weekly rad txs  Rt chest  6/30 completed no skin changes  No coughing occasionaly indigestion stated, no difficulty  Swallowing food or fluids no pain BP 132/73 mmHg  Pulse 105  Temp(Src) 97.6 F (36.4 C) (Oral)  Resp 20  Wt 178 lb 1.6 oz (80.786 kg)  Wt Readings from Last 3 Encounters:  06/28/15 178 lb 1.6 oz (80.786 kg)  06/21/15 177 lb 14.4 oz (80.695 kg)  06/20/15 180 lb 1.6 oz (81.693 kg)

## 2015-06-28 NOTE — Progress Notes (Signed)
Alan [name]   [phone]  OFFICE PROGRESS NOTE  Alan Space, MD Alan [name] [id number]  DIAGNOSIS: Stage IIIA (T1b, N2, M0) non-small cell lung cancer, adenocarcinoma presented with right right upper lobe nodule in addition to mediastinal lymphadenopathy diagnosed in November 2016.  PRIOR THERAPY: None.  CURRENT THERAPY: Concurrent chemoradiation with weekly carboplatin for AUC of 2 and paclitaxel 45 MG/M2. Status post one cycle.  INTERVAL HISTORY: Alan [name] 74 y.o. male returns to the clinic today for follow-up visit. The patient is currently undergoing concurrent chemoradiation with weekly carboplatin and paclitaxel status post 1 cycle. He tolerated the first week of his treatment fairly well with no significant adverse effects. He denied having any significant nausea or vomiting. He has no fever or chills. He denied having any significant chest pain, shortness of breath, cough or hemoptysis. He has no significant weight loss or night sweats. He had an MRI of the brain performed recently that showed no evidence for metastatic disease to the brain. The patient is here today to start cycle #2 of his concurrent chemoradiation.  MEDICAL HISTORY: Past Medical History  Diagnosis Date  . Sleep apnea     does not use cpap  . Unspecified essential hypertension   . Hypercholesteremia   . GERD (gastroesophageal reflux disease)   . Diverticulosis of colon (without mention of hemorrhage)   . Irritable bowel syndrome   . Benign neoplasm of colon     colon polyps  . Benign hypertrophy of prostate   . Erectile dysfunction   . Restless legs syndrome (RLS)   . Anxiety state, unspecified   . Onychomycosis   . Constipation, chronic   . Hypothyroidism   . Cancer [facility])     prostate cancer  . Glaucoma     ALLERGIES:  is allergic to codeine.  MEDICATIONS:  Current Outpatient Prescriptions  Medication Sig Dispense Refill    . amLODipine (NORVASC) 5 MG tablet Take 1 tablet (5 mg total) by mouth every morning. 90 tablet 1  . aspirin EC 81 MG tablet Take 81 mg by mouth daily.     . brimonidine-timolol (COMBIGAN) 0.2-0.5 % ophthalmic solution Place 1 drop into both eyes every 12 (twelve) hours.    . finasteride ([name]) 5 MG tablet Take 5 mg by mouth daily.    . fluticasone (FLONASE) 50 MCG/ACT nasal spray Place 2 sprays into both nostrils daily. (Patient taking differently: Place 2 sprays into both nostrils daily as needed for allergies or rhinitis. ) 48 g 3  . lactulose (CHRONULAC) 10 GM/15ML solution Take 10 g by mouth 2 (two) times daily as needed (constipation).     [name] levothyroxine (SYNTHROID, LEVOTHROID) 88 MCG tablet Take 1 tablet (88 mcg total) by mouth daily. (Patient taking differently: Take 88 mcg by mouth daily before breakfast. ) 45 tablet 2  . Linaclotide (LINZESS) 290 MCG CAPS capsule Take 1 capsule (290 mcg total) by mouth daily. 30 capsule 3  . omeprazole (PRILOSEC) 20 MG capsule Take 1 capsule (20 mg total) by mouth every morning. (Patient taking differently: Take 20 mg by mouth daily before breakfast. ) 90 capsule 3  . polyethylene glycol (MIRALAX / GLYCOLAX) packet Take 17 g by mouth daily as needed (constipation). Reported on 06/21/2015    . prochlorperazine (COMPAZINE) 10 MG tablet Take 1 tablet (10 mg total) by mouth every 6 (six) hours as needed for nausea or vomiting. 30 tablet 0  .  sildenafil (VIAGRA) 100 MG tablet Take 1 tablet (100 mg total) by mouth as directed. (Patient taking differently: Take 100 mg by mouth daily as needed for erectile dysfunction. ) 10 tablet 0  . simvastatin (ZOCOR) 20 MG tablet TAKE ONE TABLET BY MOUTH AT BEDTIME 90 tablet 0  . tamsulosin (FLOMAX) 0.4 MG CAPS capsule Take 1 capsule (0.4 mg total) by mouth every evening. (Patient taking differently: Take 0.8 mg by mouth at bedtime. ) 90 capsule 3  . traMADol (ULTRAM) 50 MG tablet Take 1 tablet (50 mg total) by mouth every  6 (six) hours as needed for moderate pain. 20 tablet 0   No current facility-administered medications for this visit.    SURGICAL HISTORY:  Past Surgical History  Procedure Laterality Date  . None    . Colonoscopy    . Laparoscopic appendectomy N/A 01/27/2013    Procedure: APPENDECTOMY LAPAROSCOPIC;  Surgeon: Edward Jolly, MD;  Location: [facility];  Service: General;  Laterality: N/A;  . Appendectomy  12/2012  . Thyroidectomy    . Tonsillectomy    . Cardiac catheterization  ? 2008  . Video bronchoscopy N/A 05/31/2015    Procedure: VIDEO BRONCHOSCOPY;  Surgeon: Gaye Pollack, MD;  Location: [facility];  Service: Thoracic;  Laterality: N/A;  . Mediastinoscopy N/A 05/31/2015    Procedure: MEDIASTINOSCOPY;  Surgeon: Gaye Pollack, MD;  Location: [facility];  Service: Thoracic;  Laterality: N/A;    REVIEW OF SYSTEMS:  A comprehensive review of systems was negative.   PHYSICAL EXAMINATION: General appearance: alert, cooperative, fatigued and no distress Head: Normocephalic, without obvious abnormality, atraumatic Neck: no adenopathy, no JVD, supple, symmetrical, trachea midline and thyroid not enlarged, symmetric, no tenderness/mass/nodules Lymph nodes: Cervical, supraclavicular, and axillary nodes normal. Resp: clear to auscultation bilaterally Back: symmetric, no curvature. ROM normal. No CVA tenderness. Cardio: regular rate and rhythm, S1, S2 normal, no murmur, click, rub or gallop GI: soft, non-tender; bowel sounds normal; no masses,  no organomegaly Extremities: extremities normal, atraumatic, no cyanosis or edema  ECOG PERFORMANCE STATUS: 1 - Symptomatic but completely ambulatory  Blood pressure 137/79, pulse 100, temperature 97.6 F (36.4 C), temperature source Oral, resp. rate 18, height '5\' 6"'  (1.676 m), weight 178 lb (80.74 kg), SpO2 100 %.  LABORATORY DATA: Lab Results  Component Value Date   WBC 3.5* 06/28/2015   HGB 14.0 06/28/2015   HCT 43.6 06/28/2015   MCV 88.9  06/28/2015   PLT 240 06/28/2015      Chemistry      Component Value Date/Time   NA 141 06/13/2015 1105   NA 142 05/31/2015 0645   K 3.8 06/13/2015 1105   K 3.7 05/31/2015 0645   CL 110 05/31/2015 0645   CO2 24 06/13/2015 1105   CO2 25 05/31/2015 0645   BUN 13.4 06/13/2015 1105   BUN 8 05/31/2015 0645   CREATININE 1.1 06/13/2015 1105   CREATININE 1.03 05/31/2015 0645      Component Value Date/Time   CALCIUM 8.9 06/13/2015 1105   CALCIUM 9.1 05/31/2015 0645   ALKPHOS 48 06/13/2015 1105   ALKPHOS 42 05/31/2015 0645   AST 11 06/13/2015 1105   AST 15 05/31/2015 0645   ALT 12 06/13/2015 1105   ALT 17 05/31/2015 0645   BILITOT 0.38 06/13/2015 1105   BILITOT 0.4 05/31/2015 0645       RADIOGRAPHIC STUDIES: Dg Chest 2 View  05/31/2015  CLINICAL DATA:  Lung mass. EXAM: CHEST  2 VIEW COMPARISON:  05/19/2015  PET scan FINDINGS: The right upper lobe mass is clearly visible, projecting between the right first and second anterior ribs. The lungs are otherwise clear, with no acute abnormalities. Pulmonary vasculature is normal. No pleural effusions. Heart size is normal. IMPRESSION: Right upper lobe nodule.  No acute cardiopulmonary findings. Electronically Signed   By: Andreas Newport M.D.   On: 05/31/2015 07:06   Mr Jeri Cos TK Contrast  06/21/2015  CLINICAL DATA:  Non-small cell lung cancer in the right upper lobe with mediastinal lymphadenopathy. Recent diagnosis. Staging. EXAM: MRI HEAD WITHOUT AND WITH CONTRAST TECHNIQUE: Multiplanar, multiecho pulse sequences of the brain and surrounding structures were obtained without and with intravenous contrast. CONTRAST:  40m MULTIHANCE GADOBENATE DIMEGLUMINE 529 MG/ML IV SOLN COMPARISON:  None. FINDINGS: Diffusion imaging does not show any acute or subacute infarction. The brainstem and cerebellum are normal. The cerebral hemispheres are normal except for scattered small foci of T2 and FLAIR signal in the white matter that could represent a  minimal manifestation of small vessel disease, less than often seen in healthy individuals of this age. No cortical or large vessel territory infarction. No mass lesion, hemorrhage, hydrocephalus or extra-axial collection. After contrast administration, no abnormal enhancement occurs. No pituitary mass. No inflammatory sinus disease. No skull or skullbase lesion. IMPRESSION: No evidence of metastatic disease. Normal MRI of the brain for a person of this age. Electronically Signed   By: MNelson ChimesM.D.   On: 06/21/2015 13:55   Dg Chest Port 1 View  05/31/2015  CLINICAL DATA:  Post biopsy for right lung mass EXAM: PORTABLE CHEST 1 VIEW COMPARISON:  Study obtained earlier in the day FINDINGS: There is a nodular lesion in the right upper lobe measuring 2.8 x 2.8 cm. Lungs elsewhere clear. Heart size and pulmonary vascular normal. No adenopathy. No pneumothorax. No bone lesions. There is calcification in the aortic arch region. IMPRESSION: No change in right upper lobe nodular lesion. No new opacity. No pneumothorax. No change in cardiac silhouette. Electronically Signed   By: WLowella GripIII M.D.   On: 05/31/2015 10:08   Mm Digital Diagnostic Bilat  06/15/2015  CLINICAL DATA:  Left breast swelling for 6-7 months. EXAM: DIGITAL DIAGNOSTIC BILATERAL MAMMOGRAM WITH CAD COMPARISON:  Previous exam(s). ACR Breast Density Category c: The breast tissue is heterogeneously dense, which may obscure small masses. FINDINGS: There are no suspicious masses, areas of architectural distortion or microcalcifications in either breast. There is an asymmetric breast tissue development in the subareolar left breast, consistent with gynecomastia. No suspicious masses are seen. Mammographic images were processed with CAD. IMPRESSION: Asymmetric left breast gynecomastia. RECOMMENDATION: Clinical follow-up for asymmetric gynecomastia. I have discussed the findings and recommendations with the patient. Results were also provided  in writing at the conclusion of the visit. If applicable, a reminder letter will be sent to the patient regarding the next appointment. BI-RADS CATEGORY  2: Benign Finding(s) Electronically Signed   By: DFidela SalisburyM.D.   On: 06/15/2015 11:04    ASSESSMENT AND PLAN: This is a very pleasant 74years old African-American male recently diagnosed with a stage IIIA non-small cell lung cancer, adenocarcinoma with negative EGFR mutation, negative ALK gene translocation and negative ROS 1. He is currently undergoing a course of concurrent chemoradiation with weekly carboplatin and paclitaxel status post 1 cycle. The patient tolerated the first week of his treatment fairly well. I recommended for him to continue his current treatment as scheduled. He will receive cycle #2 today. I also  discussed with him the results of the MRI of the brain. Mammogram of the breast performed recently showed a symmetric left breast gynecomastia was no clear evidence of malignancy. The patient would come back for follow-up visit in 2 weeks for reevaluation and management of any adverse effect of his treatment. The patient was advised to call immediately if he has any concerning symptoms in the interval. The patient voices understanding of current disease status and treatment options and is in agreement with the current care plan.  All questions were answered. The patient knows to call the clinic with any problems, questions or concerns. We can certainly see the patient much sooner if necessary.  Disclaimer: This note was dictated with voice recognition software. Similar sounding words can inadvertently be transcribed and may not be corrected upon review.

## 2015-06-28 NOTE — Telephone Encounter (Signed)
appt gave to patient per pof.. Staff message sent to [place].

## 2015-06-28 NOTE — Progress Notes (Signed)
Weekly Management Note Current Dose: 12 Gy  Projected Dose: 60 Gy   Narrative:  The patient presents for routine under treatment assessment.  CBCT/MVCT images/Port film x-rays were reviewed.  The chart was checked.  6/30 treatments completed. Denies skin changes or difficulty swallowing. He coughs occasionally. He has no pain. He has indigestion. He will get his chemotherapy infusion today.  Physical Findings:  No skin changes.  Vitals:  Filed Vitals:   06/28/15 0832  BP: 132/73  Pulse: 105  Temp: 97.6 F (36.4 C)  Resp: 20   Weight:  Wt Readings from Last 3 Encounters:  06/28/15 178 lb 1.6 oz (80.786 kg)  06/21/15 177 lb 14.4 oz (80.695 kg)  06/20/15 180 lb 1.6 oz (81.693 kg)   Lab Results  Component Value Date   WBC 3.9* 06/20/2015   HGB 14.7 06/20/2015   HCT 45.5 06/20/2015   MCV 88.4 06/20/2015   PLT 268 06/20/2015   Lab Results  Component Value Date   CREATININE 1.1 06/13/2015   BUN 13.4 06/13/2015   NA 141 06/13/2015   K 3.8 06/13/2015   CL 110 05/31/2015   CO2 24 06/13/2015    Impression:  The patient is tolerating radiation.  Plan:  Continue treatment as planned. I advised the patient to use Pepcid for his indigestion. We discussed possible side effects of treatment.   This document serves as a record of services personally performed by Thea Silversmith, MD. It was created on her behalf by Darcus Austin, a trained medical scribe. The creation of this record is based on the scribe's personal observations and the provider's statements to them. This document has been checked and approved by the attending provider.

## 2015-06-28 NOTE — Progress Notes (Signed)
Okay to start treatment while waiting for CMet drawn in infusion to result per Dr. [name].

## 2015-06-29 ENCOUNTER — Ambulatory Visit
Admission: RE | Admit: 2015-06-29 | Discharge: 2015-06-29 | Disposition: A | Discharge status: Home / Self Care | Payer: Medicare HMO | Source: Ambulatory Visit | Attending: Radiation Oncology | Admitting: Radiation Oncology

## 2015-06-29 DIAGNOSIS — Z510 Encounter for antineoplastic radiation therapy: Secondary | ICD-10-CM | POA: Diagnosis not present

## 2015-06-30 ENCOUNTER — Ambulatory Visit
Admission: RE | Admit: 2015-06-30 | Discharge: 2015-06-30 | Disposition: A | Discharge status: Home / Self Care | Payer: Medicare HMO | Source: Ambulatory Visit | Attending: Radiation Oncology | Admitting: Radiation Oncology

## 2015-06-30 DIAGNOSIS — Z510 Encounter for antineoplastic radiation therapy: Secondary | ICD-10-CM | POA: Diagnosis not present

## 2015-07-01 ENCOUNTER — Ambulatory Visit
Admission: RE | Admit: 2015-07-01 | Discharge: 2015-07-01 | Disposition: A | Discharge status: Home / Self Care | Payer: Medicare HMO | Source: Ambulatory Visit | Attending: Radiation Oncology | Admitting: Radiation Oncology

## 2015-07-01 DIAGNOSIS — Z510 Encounter for antineoplastic radiation therapy: Secondary | ICD-10-CM | POA: Diagnosis not present

## 2015-07-03 DIAGNOSIS — Z719 Counseling, unspecified: Secondary | ICD-10-CM

## 2015-07-03 NOTE — Congregational Nurse Program (Signed)
Congregational Nurse Program Note  Date of Encounter: 07/03/2015  Past Medical History: Past Medical History  Diagnosis Date  . Sleep apnea     does not use cpap  . Unspecified essential hypertension   . Hypercholesteremia   . GERD (gastroesophageal reflux disease)   . Diverticulosis of colon (without mention of hemorrhage)   . Irritable bowel syndrome   . Benign neoplasm of colon     colon polyps  . Benign hypertrophy of prostate   . Erectile dysfunction   . Restless legs syndrome (RLS)   . Anxiety state, unspecified   . Onychomycosis   . Constipation, chronic   . Hypothyroidism   . Cancer [facility])     prostate cancer  . Glaucoma     Encounter Details:     CNP Questionnaire - 07/03/15 2330    Patient Demographics   Is this a new or existing patient? Existing   Patient is considered a/an Not Applicable   Race African-American/Black   Patient Assistance   Location of Patient Assistance [facility] [employer]   Patient's financial/insurance status Low Income;Medicare   Uninsured Patient No   Patient referred to apply for the following financial assistance Not Applicable   Food insecurities addressed Not Applicable   Transportation assistance No   Assistance securing medications No   Educational health offerings Cancer;Safety   Encounter Details   Primary purpose of visit Education/Health Concerns   Was an Emergency Department visit averted? No   Does patient have a medical provider? Yes   Patient referred to Not Applicable   Was a mental health screening completed? (GAINS tool) No   Does patient have dental issues? No   Since previous encounter, have you referred patient for abnormal blood pressure that resulted in a new diagnosis or medication change? No   Since previous encounter, have you referred patient for abnormal blood glucose that resulted in a new diagnosis or medication change? No   For Abstraction Use Only   Does patient have insurance? Yes       Client  seen today at [facility].  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  Observed him with episode of coughing, shared with congregation that he has been short of breath at times and will have to be careful with close contact due to his immune status.  Client upbeat and publicly confident of positive outcome but realistic as well.  Client strong in his faith and reliance on God.  Will follow up as needed,

## 2015-07-05 ENCOUNTER — Other Ambulatory Visit (HOSPITAL_BASED_OUTPATIENT_CLINIC_OR_DEPARTMENT_OTHER): Payer: Medicare HMO

## 2015-07-05 ENCOUNTER — Ambulatory Visit (HOSPITAL_BASED_OUTPATIENT_CLINIC_OR_DEPARTMENT_OTHER): Payer: Medicare HMO

## 2015-07-05 ENCOUNTER — Ambulatory Visit
Admission: RE | Admit: 2015-07-05 | Discharge: 2015-07-05 | Disposition: A | Discharge status: Home / Self Care | Payer: Medicare HMO | Source: Ambulatory Visit | Attending: Radiation Oncology | Admitting: Radiation Oncology

## 2015-07-05 ENCOUNTER — Ambulatory Visit: Admission: RE | Admit: 2015-07-05 | Payer: Medicare HMO | Source: Ambulatory Visit | Admitting: Radiation Oncology

## 2015-07-05 VITALS — BP 116/70 | HR 100 | Temp 98.0°F | Resp 16

## 2015-07-05 DIAGNOSIS — Z5111 Encounter for antineoplastic chemotherapy: Secondary | ICD-10-CM

## 2015-07-05 DIAGNOSIS — Z510 Encounter for antineoplastic radiation therapy: Secondary | ICD-10-CM | POA: Diagnosis not present

## 2015-07-05 DIAGNOSIS — C3411 Malignant neoplasm of upper lobe, right bronchus or lung: Secondary | ICD-10-CM | POA: Diagnosis not present

## 2015-07-05 LAB — CBC WITH DIFFERENTIAL/PLATELET
BASO%: 0.7 % (ref 0.0–2.0)
Basophils Absolute: 0 10*3/uL (ref 0.0–0.1)
EOS ABS: 0 10*3/uL (ref 0.0–0.5)
EOS%: 1.4 % (ref 0.0–7.0)
HEMATOCRIT: 42.7 % (ref 38.4–49.9)
HEMOGLOBIN: 13.9 g/dL (ref 13.0–17.1)
LYMPH#: 0.6 10*3/uL — AB (ref 0.9–3.3)
LYMPH%: 21.9 % (ref 14.0–49.0)
MCH: 28.9 pg (ref 27.2–33.4)
MCHC: 32.7 g/dL (ref 32.0–36.0)
MCV: 88.3 fL (ref 79.3–98.0)
MONO#: 0.3 10*3/uL (ref 0.1–0.9)
MONO%: 9.5 % (ref 0.0–14.0)
NEUT%: 66.5 % (ref 39.0–75.0)
NEUTROS ABS: 1.8 10*3/uL (ref 1.5–6.5)
PLATELETS: 217 10*3/uL (ref 140–400)
RBC: 4.83 10*6/uL (ref 4.20–5.82)
RDW: 15.1 % — AB (ref 11.0–14.6)
WBC: 2.7 10*3/uL — AB (ref 4.0–10.3)

## 2015-07-05 LAB — COMPREHENSIVE METABOLIC PANEL
ALBUMIN: 3.5 g/dL (ref 3.5–5.0)
ALK PHOS: 44 U/L (ref 40–150)
ALT: 13 U/L (ref 0–55)
ANION GAP: 8 meq/L (ref 3–11)
AST: 14 U/L (ref 5–34)
BILIRUBIN TOTAL: 0.31 mg/dL (ref 0.20–1.20)
BUN: 15.3 mg/dL (ref 7.0–26.0)
CALCIUM: 8.9 mg/dL (ref 8.4–10.4)
CO2: 21 meq/L — AB (ref 22–29)
CREATININE: 1 mg/dL (ref 0.7–1.3)
Chloride: 110 mEq/L — ABNORMAL HIGH (ref 98–109)
EGFR: 88 mL/min/{1.73_m2} — AB (ref >90)
Glucose: 113 mg/dl (ref 70–140)
Potassium: 4.4 mEq/L (ref 3.5–5.1)
Sodium: 139 mEq/L (ref 136–145)
TOTAL PROTEIN: 6.5 g/dL (ref 6.4–8.3)

## 2015-07-05 LAB — RESEARCH LABS

## 2015-07-05 MED ORDER — FAMOTIDINE IN NACL 20-0.9 MG/50ML-% IV SOLN
INTRAVENOUS | Status: AC
  Filled 2015-07-05: qty 50

## 2015-07-05 MED ORDER — DIPHENHYDRAMINE HCL 50 MG/ML IJ SOLN
INTRAMUSCULAR | Status: AC
  Filled 2015-07-05: qty 1

## 2015-07-05 MED ORDER — FAMOTIDINE IN NACL 20-0.9 MG/50ML-% IV SOLN
20.0000 mg | Freq: Once | INTRAVENOUS | Status: AC
  Administered 2015-07-05: 20 mg via INTRAVENOUS

## 2015-07-05 MED ORDER — SODIUM CHLORIDE 0.9 % IV SOLN
191.4000 mg | Freq: Once | INTRAVENOUS | Status: AC
  Administered 2015-07-05: 190 mg via INTRAVENOUS
  Filled 2015-07-05: qty 19

## 2015-07-05 MED ORDER — DIPHENHYDRAMINE HCL 50 MG/ML IJ SOLN
50.0000 mg | Freq: Once | INTRAMUSCULAR | Status: AC
  Administered 2015-07-05: 50 mg via INTRAVENOUS

## 2015-07-05 MED ORDER — PACLITAXEL CHEMO INJECTION 300 MG/50ML
45.0000 mg/m2 | Freq: Once | INTRAVENOUS | Status: AC
  Administered 2015-07-05: 84 mg via INTRAVENOUS
  Filled 2015-07-05: qty 14

## 2015-07-05 MED ORDER — SODIUM CHLORIDE 0.9 % IV SOLN
Freq: Once | INTRAVENOUS | Status: AC
  Administered 2015-07-05: 12:00:00 via INTRAVENOUS
  Filled 2015-07-05: qty 8

## 2015-07-05 MED ORDER — SODIUM CHLORIDE 0.9 % IV SOLN
Freq: Once | INTRAVENOUS | Status: AC
  Administered 2015-07-05: 12:00:00 via INTRAVENOUS

## 2015-07-05 NOTE — Patient Instructions (Signed)
[facility] Discharge Instructions for Patients Receiving Chemotherapy  Today you received the following chemotherapy agents:  Taxol, Carboplatin  To help prevent nausea and vomiting after your treatment, we encourage you to take your nausea medication.   If you develop nausea and vomiting that is not controlled by your nausea medication, call the clinic.   BELOW ARE SYMPTOMS THAT SHOULD BE REPORTED IMMEDIATELY:  *FEVER GREATER THAN 100.5 F  *CHILLS WITH OR WITHOUT FEVER  NAUSEA AND VOMITING THAT IS NOT CONTROLLED WITH YOUR NAUSEA MEDICATION  *UNUSUAL SHORTNESS OF BREATH  *UNUSUAL BRUISING OR BLEEDING  TENDERNESS IN MOUTH AND THROAT WITH OR WITHOUT PRESENCE OF ULCERS  *URINARY PROBLEMS  *BOWEL PROBLEMS  UNUSUAL RASH Items with * indicate a potential emergency and should be followed up as soon as possible.  Feel free to call the clinic you have any questions or concerns. The clinic phone number is [phone].  Please show the [place] at check-in to the Emergency Department and triage nurse.

## 2015-07-05 NOTE — Progress Notes (Unsigned)
Received verbal order, OK to treat despite low WBC from Dr [name] & OK to treat prior to [place].  Cmet drawn with IV start.

## 2015-07-06 ENCOUNTER — Encounter: Payer: Self-pay | Admitting: Radiation Oncology

## 2015-07-06 ENCOUNTER — Ambulatory Visit
Admission: RE | Admit: 2015-07-06 | Discharge: 2015-07-06 | Disposition: A | Discharge status: Home / Self Care | Payer: Medicare HMO | Source: Ambulatory Visit | Attending: Radiation Oncology | Admitting: Radiation Oncology

## 2015-07-06 ENCOUNTER — Telehealth: Payer: Self-pay | Admitting: *Deleted

## 2015-07-06 VITALS — BP 136/86 | HR 107 | Temp 97.6°F | Ht 66.0 in | Wt 180.8 lb

## 2015-07-06 DIAGNOSIS — C3411 Malignant neoplasm of upper lobe, right bronchus or lung: Secondary | ICD-10-CM

## 2015-07-06 DIAGNOSIS — Z510 Encounter for antineoplastic radiation therapy: Secondary | ICD-10-CM | POA: Diagnosis not present

## 2015-07-06 MED ORDER — CHLORPROMAZINE HCL 25 MG PO TABS: 25.0000 mg | ORAL_TABLET | Freq: Three times a day (TID) | ORAL | Status: DC | PRN

## 2015-07-06 NOTE — Progress Notes (Signed)
Alan [name] is here for his 11th fraction of radiation to his Right Chest. He complains of frequent hiccups  and heartburn at times and he plans to contact Dr. [name] today about these concerns. He admits to some fatigue, and will rest when he feels tired. He denies any shortness of breath. He admits to constipation today, and plans to take his lactulose twice today when he gets home. He has no other concerns at this time.   BP 136/86 mmHg  Pulse 107  Temp(Src) 97.6 F (36.4 C)  Ht '5\' 6"'$  (1.676 m)  Wt 180 lb 12.8 oz (82.01 kg)  BMI 29.20 kg/m2  SpO2 98%   Wt Readings from Last 3 Encounters:  07/06/15 180 lb 12.8 oz (82.01 kg)  06/28/15 178 lb (80.74 kg)  06/28/15 178 lb 1.6 oz (80.786 kg)

## 2015-07-06 NOTE — Progress Notes (Signed)
  Radiation Oncology         [phone]) [phone] ________________________________  Name: Alan [name] MRN: [id number]  Date: 07/06/2015  DOB: October 25, 1940  Weekly Radiation Therapy Management    ICD-9-CM ICD-10-CM   1. Malignant neoplasm of right upper lobe of lung (HCC) 162.3 C34.11      Current Dose: 22 Gy     Planned Dose:  60 Gy  Narrative . . . . . . . . The patient presents for routine under treatment assessment.                                   The patient is without complaint.  chemotherapy yesterday and also tolerated this well. Alan [name] is here for his 11th fraction of radiation to his Right Chest. He complains of frequent hiccups and heartburn at times and he plans to contact Dr. [name] today about these concerns. He admits to some fatigue, and will rest when he feels tired. He admits to constipation today, and plans to take his lactulose twice today when he gets home. He has no other concerns at this time. He currently takes Prilosec once a day, which he is thinking about changing to twice daily. If his hiccups persist, he will talk to Dr. [name] to prescribe him medication. He denies pain or discomfort when swallowing. We discussed Carafate if he does begin to have difficulty swallowing.  He mentions he had shortness of breath and fatigue after 10-15 minutes of physical activity.                                 Set-up films were reviewed.                                 The chart was checked. Physical Findings. . .  height is '5\' 6"'$  (1.676 m) and weight is 180 lb 12.8 oz (82.01 kg). His temperature is 97.6 F (36.4 C). His blood pressure is 136/86 and his pulse is 107. His oxygen saturation is 98%. . Weight essentially stable.  No significant changes. The lungs are clear. The heart has a regular rhythm and rate Impression . . . . . . . The patient is tolerating radiation. Plan . . . . . . . . . . . . Continue treatment as planned.  ________________________________   Blair Promise, PhD, MD  This document serves as a record of services personally performed by Gery Pray, MD. It was created on his behalf by Lendon Collar, a trained medical scribe. The creation of this record is based on the scribe's personal observations and the provider's statements to them. This document has been checked and approved by the attending provider.

## 2015-07-06 NOTE — Telephone Encounter (Signed)
Pt reported to [facility] with complaints of hiccoughs keeping him awake through out the night. Discussed with Dr. [name] VO given for Thorazine '25mg'$  TID PRN. This nurse discussed with pt and advised new medication, discussed precautions with patient.

## 2015-07-07 ENCOUNTER — Ambulatory Visit
Admission: RE | Admit: 2015-07-07 | Discharge: 2015-07-07 | Disposition: A | Discharge status: Home / Self Care | Payer: Medicare HMO | Source: Ambulatory Visit | Attending: Radiation Oncology | Admitting: Radiation Oncology

## 2015-07-07 DIAGNOSIS — Z510 Encounter for antineoplastic radiation therapy: Secondary | ICD-10-CM | POA: Diagnosis not present

## 2015-07-08 ENCOUNTER — Ambulatory Visit
Admission: RE | Admit: 2015-07-08 | Discharge: 2015-07-08 | Disposition: A | Discharge status: Home / Self Care | Payer: Medicare HMO | Source: Ambulatory Visit | Attending: Radiation Oncology | Admitting: Radiation Oncology

## 2015-07-08 DIAGNOSIS — Z510 Encounter for antineoplastic radiation therapy: Secondary | ICD-10-CM | POA: Diagnosis not present

## 2015-07-11 ENCOUNTER — Ambulatory Visit (HOSPITAL_BASED_OUTPATIENT_CLINIC_OR_DEPARTMENT_OTHER): Payer: Medicare HMO | Admitting: Internal Medicine

## 2015-07-11 ENCOUNTER — Encounter: Payer: Self-pay | Admitting: Internal Medicine

## 2015-07-11 ENCOUNTER — Ambulatory Visit (HOSPITAL_BASED_OUTPATIENT_CLINIC_OR_DEPARTMENT_OTHER): Payer: Medicare HMO

## 2015-07-11 ENCOUNTER — Other Ambulatory Visit (HOSPITAL_BASED_OUTPATIENT_CLINIC_OR_DEPARTMENT_OTHER): Payer: Medicare HMO

## 2015-07-11 ENCOUNTER — Ambulatory Visit
Admission: RE | Admit: 2015-07-11 | Discharge: 2015-07-11 | Disposition: A | Discharge status: Home / Self Care | Payer: Medicare HMO | Source: Ambulatory Visit | Attending: Radiation Oncology | Admitting: Radiation Oncology

## 2015-07-11 ENCOUNTER — Other Ambulatory Visit: Payer: Medicare HMO

## 2015-07-11 VITALS — BP 139/74 | HR 108 | Temp 97.6°F | Resp 18 | Ht 66.0 in | Wt 176.1 lb

## 2015-07-11 DIAGNOSIS — C3411 Malignant neoplasm of upper lobe, right bronchus or lung: Secondary | ICD-10-CM | POA: Diagnosis not present

## 2015-07-11 DIAGNOSIS — Z510 Encounter for antineoplastic radiation therapy: Secondary | ICD-10-CM | POA: Diagnosis not present

## 2015-07-11 DIAGNOSIS — Z5111 Encounter for antineoplastic chemotherapy: Secondary | ICD-10-CM | POA: Diagnosis not present

## 2015-07-11 LAB — COMPREHENSIVE METABOLIC PANEL
ALBUMIN: 3.6 g/dL (ref 3.5–5.0)
ALK PHOS: 43 U/L (ref 40–150)
ALT: 14 U/L (ref 0–55)
AST: 9 U/L (ref 5–34)
Anion Gap: 7 mEq/L (ref 3–11)
BUN: 15 mg/dL (ref 7.0–26.0)
CALCIUM: 9 mg/dL (ref 8.4–10.4)
CO2: 23 mEq/L (ref 22–29)
Chloride: 109 mEq/L (ref 98–109)
Creatinine: 1.1 mg/dL (ref 0.7–1.3)
EGFR: 79 mL/min/{1.73_m2} — AB (ref >90)
GLUCOSE: 111 mg/dL (ref 70–140)
POTASSIUM: 3.6 meq/L (ref 3.5–5.1)
SODIUM: 140 meq/L (ref 136–145)
Total Bilirubin: 0.48 mg/dL (ref 0.20–1.20)
Total Protein: 6.5 g/dL (ref 6.4–8.3)

## 2015-07-11 LAB — CBC WITH DIFFERENTIAL/PLATELET
BASO%: 0.5 % (ref 0.0–2.0)
BASOS ABS: 0 10*3/uL (ref 0.0–0.1)
EOS ABS: 0 10*3/uL (ref 0.0–0.5)
EOS%: 1.3 % (ref 0.0–7.0)
HCT: 42.6 % (ref 38.4–49.9)
HEMOGLOBIN: 14.2 g/dL (ref 13.0–17.1)
LYMPH%: 21.8 % (ref 14.0–49.0)
MCH: 29.3 pg (ref 27.2–33.4)
MCHC: 33.3 g/dL (ref 32.0–36.0)
MCV: 88 fL (ref 79.3–98.0)
MONO#: 0.2 10*3/uL (ref 0.1–0.9)
MONO%: 8 % (ref 0.0–14.0)
NEUT#: 1.3 10*3/uL — ABNORMAL LOW (ref 1.5–6.5)
NEUT%: 68.4 % (ref 39.0–75.0)
Platelets: 200 10*3/uL (ref 140–400)
RBC: 4.84 10*6/uL (ref 4.20–5.82)
RDW: 15.4 % — AB (ref 11.0–14.6)
WBC: 2 10*3/uL — ABNORMAL LOW (ref 4.0–10.3)
lymph#: 0.4 10*3/uL — ABNORMAL LOW (ref 0.9–3.3)

## 2015-07-11 MED ORDER — FAMOTIDINE IN NACL 20-0.9 MG/50ML-% IV SOLN
20.0000 mg | Freq: Once | INTRAVENOUS | Status: AC
  Administered 2015-07-11: 20 mg via INTRAVENOUS

## 2015-07-11 MED ORDER — SODIUM CHLORIDE 0.9 % IV SOLN
Freq: Once | INTRAVENOUS | Status: AC
  Administered 2015-07-11: 10:00:00 via INTRAVENOUS

## 2015-07-11 MED ORDER — SODIUM CHLORIDE 0.9 % IV SOLN
191.4000 mg | Freq: Once | INTRAVENOUS | Status: AC
  Administered 2015-07-11: 190 mg via INTRAVENOUS
  Filled 2015-07-11: qty 19

## 2015-07-11 MED ORDER — DIPHENHYDRAMINE HCL 50 MG/ML IJ SOLN
50.0000 mg | Freq: Once | INTRAMUSCULAR | Status: AC
  Administered 2015-07-11: 50 mg via INTRAVENOUS

## 2015-07-11 MED ORDER — FAMOTIDINE IN NACL 20-0.9 MG/50ML-% IV SOLN
INTRAVENOUS | Status: AC
  Filled 2015-07-11: qty 50

## 2015-07-11 MED ORDER — DIPHENHYDRAMINE HCL 50 MG/ML IJ SOLN
INTRAMUSCULAR | Status: AC
  Filled 2015-07-11: qty 1

## 2015-07-11 MED ORDER — PACLITAXEL CHEMO INJECTION 300 MG/50ML
45.0000 mg/m2 | Freq: Once | INTRAVENOUS | Status: AC
  Administered 2015-07-11: 84 mg via INTRAVENOUS
  Filled 2015-07-11: qty 14

## 2015-07-11 MED ORDER — SODIUM CHLORIDE 0.9 % IV SOLN
Freq: Once | INTRAVENOUS | Status: AC
  Administered 2015-07-11: 10:00:00 via INTRAVENOUS
  Filled 2015-07-11: qty 8

## 2015-07-11 NOTE — Progress Notes (Signed)
Labs reviewed by MD, ok to treat

## 2015-07-11 NOTE — Progress Notes (Signed)
Per Stanton Kidney RN, per Dr. [name], okay to treat with today's labs, [place] 1.3.

## 2015-07-11 NOTE — Progress Notes (Signed)
[facility] [phone]   [phone]  OFFICE PROGRESS NOTE  [name], MD [place] [address]  DIAGNOSIS: Stage IIIA (T1b, N2, M0) non-small cell lung cancer, adenocarcinoma presented with right right upper lobe nodule in addition to mediastinal lymphadenopathy diagnosed in November 2016.  PRIOR THERAPY: None.  CURRENT THERAPY: Concurrent chemoradiation with weekly carboplatin for AUC of 2 and paclitaxel 45 MG/M2. Status post 3 cycles.  INTERVAL HISTORY: Alan [name] 75 y.o. male returns to the clinic today for follow-up visit. The patient is currently undergoing concurrent chemoradiation with weekly carboplatin and paclitaxel status post 3 cycles. He is tolerating his treatment fairly well with no significant adverse effects. He denied having any significant nausea or vomiting. He has no fever or chills. He denied having any significant chest pain, shortness of breath, cough or hemoptysis. He has no significant weight loss or night sweats. He complained of some hiccup recently and was treated with Thorazine The patient is here today to start cycle #4 of his concurrent chemoradiation.  MEDICAL HISTORY: Past Medical History  Diagnosis Date  . Sleep apnea     does not use cpap  . Unspecified essential hypertension   . Hypercholesteremia   . GERD (gastroesophageal reflux disease)   . Diverticulosis of colon (without mention of hemorrhage)   . Irritable bowel syndrome   . Benign neoplasm of colon     colon polyps  . Benign hypertrophy of prostate   . Erectile dysfunction   . Restless legs syndrome (RLS)   . Anxiety state, unspecified   . Onychomycosis   . Constipation, chronic   . Hypothyroidism   . Cancer [facility])     prostate cancer  . Glaucoma     ALLERGIES:  is allergic to codeine.  MEDICATIONS:  Current Outpatient Prescriptions  Medication Sig Dispense Refill  . amLODipine (NORVASC) 5 MG tablet Take 1 tablet  (5 mg total) by mouth every morning. 90 tablet 1  . aspirin EC 81 MG tablet Take 81 mg by mouth daily.     . brimonidine-timolol (COMBIGAN) 0.2-0.5 % ophthalmic solution Place 1 drop into both eyes every 12 (twelve) hours.    . chlorproMAZINE (THORAZINE) 25 MG tablet Take 1 tablet (25 mg total) by mouth 3 (three) times daily as needed for hiccoughs. 30 tablet 0  . finasteride ([name]) 5 MG tablet Take 5 mg by mouth daily.    . fluticasone (FLONASE) 50 MCG/ACT nasal spray Place 2 sprays into both nostrils daily. (Patient taking differently: Place 2 sprays into both nostrils daily as needed for allergies or rhinitis. ) 48 g 3  . lactulose (CHRONULAC) 10 GM/15ML solution Take 10 g by mouth 2 (two) times daily as needed (constipation).     [name] levothyroxine (SYNTHROID, LEVOTHROID) 88 MCG tablet Take 1 tablet (88 mcg total) by mouth daily. (Patient taking differently: Take 88 mcg by mouth daily before breakfast. ) 45 tablet 2  . Linaclotide (LINZESS) 290 MCG CAPS capsule Take 1 capsule (290 mcg total) by mouth daily. 30 capsule 3  . omeprazole (PRILOSEC) 20 MG capsule Take 1 capsule (20 mg total) by mouth every morning. (Patient taking differently: Take 20 mg by mouth daily before breakfast. ) 90 capsule 3  . polyethylene glycol (MIRALAX / GLYCOLAX) packet Take 17 g by mouth daily as needed (constipation). Reported on 06/21/2015    . prochlorperazine (COMPAZINE) 10 MG tablet Take 1 tablet (10 mg total) by mouth  every 6 (six) hours as needed for nausea or vomiting. 30 tablet 0  . sildenafil (VIAGRA) 100 MG tablet Take 1 tablet (100 mg total) by mouth as directed. (Patient taking differently: Take 100 mg by mouth daily as needed for erectile dysfunction. ) 10 tablet 0  . simvastatin (ZOCOR) 20 MG tablet TAKE ONE TABLET BY MOUTH AT BEDTIME 90 tablet 0  . tamsulosin (FLOMAX) 0.4 MG CAPS capsule Take 1 capsule (0.4 mg total) by mouth every evening. (Patient taking differently: Take 0.8 mg by mouth at bedtime. )  90 capsule 3  . traMADol (ULTRAM) 50 MG tablet Take 1 tablet (50 mg total) by mouth every 6 (six) hours as needed for moderate pain. (Patient not taking: Reported on 07/06/2015) 20 tablet 0   No current facility-administered medications for this visit.    SURGICAL HISTORY:  Past Surgical History  Procedure Laterality Date  . None    . Colonoscopy    . Laparoscopic appendectomy N/A 01/27/2013    Procedure: APPENDECTOMY LAPAROSCOPIC;  Surgeon: Edward Jolly, MD;  Location: [facility];  Service: General;  Laterality: N/A;  . Appendectomy  12/2012  . Thyroidectomy    . Tonsillectomy    . Cardiac catheterization  ? 2008  . Video bronchoscopy N/A 05/31/2015    Procedure: VIDEO BRONCHOSCOPY;  Surgeon: Gaye Pollack, MD;  Location: [facility];  Service: Thoracic;  Laterality: N/A;  . Mediastinoscopy N/A 05/31/2015    Procedure: MEDIASTINOSCOPY;  Surgeon: Gaye Pollack, MD;  Location: [facility];  Service: Thoracic;  Laterality: N/A;    REVIEW OF SYSTEMS:  A comprehensive review of systems was negative.   PHYSICAL EXAMINATION: General appearance: alert, cooperative, fatigued and no distress Head: Normocephalic, without obvious abnormality, atraumatic Neck: no adenopathy, no JVD, supple, symmetrical, trachea midline and thyroid not enlarged, symmetric, no tenderness/mass/nodules Lymph nodes: Cervical, supraclavicular, and axillary nodes normal. Resp: clear to auscultation bilaterally Back: symmetric, no curvature. ROM normal. No CVA tenderness. Cardio: regular rate and rhythm, S1, S2 normal, no murmur, click, rub or gallop GI: soft, non-tender; bowel sounds normal; no masses,  no organomegaly Extremities: extremities normal, atraumatic, no cyanosis or edema  ECOG PERFORMANCE STATUS: 1 - Symptomatic but completely ambulatory  There were no vitals taken for this visit.  LABORATORY DATA: Lab Results  Component Value Date   WBC 2.0* 07/11/2015   HGB 14.2 07/11/2015   HCT 42.6 07/11/2015   MCV  88.0 07/11/2015   PLT 200 07/11/2015      Chemistry      Component Value Date/Time   NA 139 07/05/2015 1137   NA 142 05/31/2015 0645   K 4.4 07/05/2015 1137   K 3.7 05/31/2015 0645   CL 110 05/31/2015 0645   CO2 21* 07/05/2015 1137   CO2 25 05/31/2015 0645   BUN 15.3 07/05/2015 1137   BUN 8 05/31/2015 0645   CREATININE 1.0 07/05/2015 1137   CREATININE 1.03 05/31/2015 0645      Component Value Date/Time   CALCIUM 8.9 07/05/2015 1137   CALCIUM 9.1 05/31/2015 0645   ALKPHOS 44 07/05/2015 1137   ALKPHOS 42 05/31/2015 0645   AST 14 07/05/2015 1137   AST 15 05/31/2015 0645   ALT 13 07/05/2015 1137   ALT 17 05/31/2015 0645   BILITOT 0.31 07/05/2015 1137   BILITOT 0.4 05/31/2015 0645       RADIOGRAPHIC STUDIES: Mr Jeri Cos Wo Contrast  Jul 20, 2015  CLINICAL DATA:  Non-small cell lung cancer in the right upper lobe  with mediastinal lymphadenopathy. Recent diagnosis. Staging. EXAM: MRI HEAD WITHOUT AND WITH CONTRAST TECHNIQUE: Multiplanar, multiecho pulse sequences of the brain and surrounding structures were obtained without and with intravenous contrast. CONTRAST:  14m MULTIHANCE GADOBENATE DIMEGLUMINE 529 MG/ML IV SOLN COMPARISON:  None. FINDINGS: Diffusion imaging does not show any acute or subacute infarction. The brainstem and cerebellum are normal. The cerebral hemispheres are normal except for scattered small foci of T2 and FLAIR signal in the white matter that could represent a minimal manifestation of small vessel disease, less than often seen in healthy individuals of this age. No cortical or large vessel territory infarction. No mass lesion, hemorrhage, hydrocephalus or extra-axial collection. After contrast administration, no abnormal enhancement occurs. No pituitary mass. No inflammatory sinus disease. No skull or skullbase lesion. IMPRESSION: No evidence of metastatic disease. Normal MRI of the brain for a person of this age. Electronically Signed   By: MNelson ChimesM.D.    On: 06/21/2015 13:55   Mm Digital Diagnostic Bilat  06/15/2015  CLINICAL DATA:  Left breast swelling for 6-7 months. EXAM: DIGITAL DIAGNOSTIC BILATERAL MAMMOGRAM WITH CAD COMPARISON:  Previous exam(s). ACR Breast Density Category c: The breast tissue is heterogeneously dense, which may obscure small masses. FINDINGS: There are no suspicious masses, areas of architectural distortion or microcalcifications in either breast. There is an asymmetric breast tissue development in the subareolar left breast, consistent with gynecomastia. No suspicious masses are seen. Mammographic images were processed with CAD. IMPRESSION: Asymmetric left breast gynecomastia. RECOMMENDATION: Clinical follow-up for asymmetric gynecomastia. I have discussed the findings and recommendations with the patient. Results were also provided in writing at the conclusion of the visit. If applicable, a reminder letter will be sent to the patient regarding the next appointment. BI-RADS CATEGORY  2: Benign Finding(s) Electronically Signed   By: DFidela SalisburyM.D.   On: 06/15/2015 11:04    ASSESSMENT AND PLAN: This is a very pleasant 75years old African-American male recently diagnosed with a stage IIIA non-small cell lung cancer, adenocarcinoma with negative EGFR mutation, negative ALK gene translocation and negative ROS 1. He is currently undergoing a course of concurrent chemoradiation with weekly carboplatin and paclitaxel status post 3 cycles. The patient tolerated the first week of his treatment fairly well. I recommended for him to continue his current treatment as scheduled. He will receive cycle #4 today. The patient would come back for follow-up visit in 2 weeks for reevaluation and management of any adverse effect of his treatment. The patient was advised to call immediately if he has any concerning symptoms in the interval. The patient voices understanding of current disease status and treatment options and is in agreement  with the current care plan.  All questions were answered. The patient knows to call the clinic with any problems, questions or concerns. We can certainly see the patient much sooner if necessary.  Disclaimer: This note was dictated with voice recognition software. Similar sounding words can inadvertently be transcribed and may not be corrected upon review.

## 2015-07-11 NOTE — Patient Instructions (Signed)
[facility] Discharge Instructions for Patients Receiving Chemotherapy  Today you received the following chemotherapy agents Taxol/Carboplatin  To help prevent nausea and vomiting after your treatment, we encourage you to take your nausea medication    If you develop nausea and vomiting that is not controlled by your nausea medication, call the clinic.   BELOW ARE SYMPTOMS THAT SHOULD BE REPORTED IMMEDIATELY:  *FEVER GREATER THAN 100.5 F  *CHILLS WITH OR WITHOUT FEVER  NAUSEA AND VOMITING THAT IS NOT CONTROLLED WITH YOUR NAUSEA MEDICATION  *UNUSUAL SHORTNESS OF BREATH  *UNUSUAL BRUISING OR BLEEDING  TENDERNESS IN MOUTH AND THROAT WITH OR WITHOUT PRESENCE OF ULCERS  *URINARY PROBLEMS  *BOWEL PROBLEMS  UNUSUAL RASH Items with * indicate a potential emergency and should be followed up as soon as possible.  Feel free to call the clinic you have any questions or concerns. The clinic phone number is [phone].  Please show the CHEMO ALERT CARD at check-in to the Emergency Department and triage nurse.   

## 2015-07-12 ENCOUNTER — Encounter: Payer: Self-pay | Admitting: Radiation Oncology

## 2015-07-12 ENCOUNTER — Ambulatory Visit
Admission: RE | Admit: 2015-07-12 | Discharge: 2015-07-12 | Disposition: A | Discharge status: Home / Self Care | Payer: Medicare HMO | Source: Ambulatory Visit | Attending: Radiation Oncology | Admitting: Radiation Oncology

## 2015-07-12 ENCOUNTER — Encounter: Payer: Self-pay | Admitting: Oncology

## 2015-07-12 VITALS — BP 135/70 | HR 108 | Temp 97.9°F | Resp 16 | Ht 66.0 in | Wt 182.7 lb

## 2015-07-12 DIAGNOSIS — Z510 Encounter for antineoplastic radiation therapy: Secondary | ICD-10-CM | POA: Diagnosis not present

## 2015-07-12 DIAGNOSIS — C3411 Malignant neoplasm of upper lobe, right bronchus or lung: Secondary | ICD-10-CM

## 2015-07-12 NOTE — Progress Notes (Signed)
Alan [name] has completed 15 fractions to his right chest.  He denies pain, coughing, shortness of breath and sore throat/trouble swallowing.  He reports having chemotherapy yesterday and had the hiccups last night.  He takes thorazine as needed and says it helps.  He reports having a good appetite.  He reports having fatigue.  He denies having any skin irritation and has not started using radiaplex yet.  BP 135/70 mmHg  Pulse 108  Temp(Src) 97.9 F (36.6 C) (Oral)  Resp 16  Ht '5\' 6"'$  (1.676 m)  Wt 182 lb 11.2 oz (82.872 kg)  BMI 29.50 kg/m2  SpO2 100%   Wt Readings from Last 3 Encounters:  07/12/15 182 lb 11.2 oz (82.872 kg)  07/11/15 176 lb 1.6 oz (79.878 kg)  07/06/15 180 lb 12.8 oz (82.01 kg)

## 2015-07-12 NOTE — Progress Notes (Signed)
  Radiation Oncology         [phone]) [phone] ________________________________  Name: Alan [name] MRN: [id number]  Date: 07/12/2015  DOB: 05-16-1941  Weekly Radiation Therapy Management    ICD-9-CM ICD-10-CM   1. Malignant neoplasm of right upper lobe of lung (HCC) 162.3 C34.11      Current Dose: 30 Gy     Planned Dose:  60 Gy  Narrative . . . . . . . . The patient presents for routine under treatment assessment.                                   The patient is without complaint except for problems with hiccups. Patient was given Thorazine by medical oncology which is helped this issue                                 Set-up films were reviewed.                                 The chart was checked. Physical Findings. . .  height is '5\' 6"'$  (1.676 m) and weight is 182 lb 11.2 oz (82.872 kg). His oral temperature is 97.9 F (36.6 C). His blood pressure is 135/70 and his pulse is 108. His respiration is 16 and oxygen saturation is 100%. . The lungs are clear. The heart has a regular rhythm with a slightly increased rate Impression . . . . . . . The patient is tolerating radiation. Plan . . . . . . . . . . . . Continue treatment as planned. The patient's son will be staying with him until he completes his therapy to help with transportation issues help around the house.  ________________________________   Blair Promise, PhD, MD

## 2015-07-13 ENCOUNTER — Ambulatory Visit
Admission: RE | Admit: 2015-07-13 | Discharge: 2015-07-13 | Disposition: A | Discharge status: Home / Self Care | Payer: Medicare HMO | Source: Ambulatory Visit | Attending: Radiation Oncology | Admitting: Radiation Oncology

## 2015-07-13 ENCOUNTER — Telehealth: Payer: Self-pay | Admitting: Internal Medicine

## 2015-07-13 DIAGNOSIS — Z510 Encounter for antineoplastic radiation therapy: Secondary | ICD-10-CM | POA: Diagnosis not present

## 2015-07-13 NOTE — Telephone Encounter (Signed)
Added additonal weekly appointments for January and February. Also confirmed 1/16 and 1/23 - patient to get new schedule 1/16.

## 2015-07-14 ENCOUNTER — Ambulatory Visit
Admission: RE | Admit: 2015-07-14 | Discharge: 2015-07-14 | Disposition: A | Discharge status: Home / Self Care | Payer: Medicare HMO | Source: Ambulatory Visit | Attending: Radiation Oncology | Admitting: Radiation Oncology

## 2015-07-14 DIAGNOSIS — Z510 Encounter for antineoplastic radiation therapy: Secondary | ICD-10-CM | POA: Diagnosis not present

## 2015-07-15 ENCOUNTER — Ambulatory Visit
Admission: RE | Admit: 2015-07-15 | Discharge: 2015-07-15 | Disposition: A | Discharge status: Home / Self Care | Payer: Medicare HMO | Source: Ambulatory Visit | Attending: Radiation Oncology | Admitting: Radiation Oncology

## 2015-07-15 DIAGNOSIS — Z510 Encounter for antineoplastic radiation therapy: Secondary | ICD-10-CM | POA: Diagnosis not present

## 2015-07-17 ENCOUNTER — Other Ambulatory Visit: Payer: Self-pay | Admitting: Internal Medicine

## 2015-07-17 DIAGNOSIS — Z719 Counseling, unspecified: Secondary | ICD-10-CM

## 2015-07-18 ENCOUNTER — Ambulatory Visit (HOSPITAL_BASED_OUTPATIENT_CLINIC_OR_DEPARTMENT_OTHER): Payer: Medicare HMO

## 2015-07-18 ENCOUNTER — Ambulatory Visit
Admission: RE | Admit: 2015-07-18 | Discharge: 2015-07-18 | Disposition: A | Discharge status: Home / Self Care | Payer: Medicare HMO | Source: Ambulatory Visit | Attending: Radiation Oncology | Admitting: Radiation Oncology

## 2015-07-18 ENCOUNTER — Other Ambulatory Visit (HOSPITAL_BASED_OUTPATIENT_CLINIC_OR_DEPARTMENT_OTHER): Payer: Medicare HMO

## 2015-07-18 VITALS — BP 104/61 | HR 105 | Temp 98.4°F | Resp 18

## 2015-07-18 DIAGNOSIS — C3411 Malignant neoplasm of upper lobe, right bronchus or lung: Secondary | ICD-10-CM

## 2015-07-18 DIAGNOSIS — Z5111 Encounter for antineoplastic chemotherapy: Secondary | ICD-10-CM

## 2015-07-18 DIAGNOSIS — Z510 Encounter for antineoplastic radiation therapy: Secondary | ICD-10-CM | POA: Diagnosis not present

## 2015-07-18 LAB — CBC WITH DIFFERENTIAL/PLATELET
BASO%: 0.9 % (ref 0.0–2.0)
Basophils Absolute: 0 10*3/uL (ref 0.0–0.1)
EOS%: 1.2 % (ref 0.0–7.0)
Eosinophils Absolute: 0 10*3/uL (ref 0.0–0.5)
HEMATOCRIT: 39.9 % (ref 38.4–49.9)
HEMOGLOBIN: 13.2 g/dL (ref 13.0–17.1)
LYMPH#: 0.3 10*3/uL — AB (ref 0.9–3.3)
LYMPH%: 15.4 % (ref 14.0–49.0)
MCH: 29.2 pg (ref 27.2–33.4)
MCHC: 33.1 g/dL (ref 32.0–36.0)
MCV: 88.3 fL (ref 79.3–98.0)
MONO#: 0.2 10*3/uL (ref 0.1–0.9)
MONO%: 10.3 % (ref 0.0–14.0)
NEUT%: 72.2 % (ref 39.0–75.0)
NEUTROS ABS: 1.5 10*3/uL (ref 1.5–6.5)
PLATELETS: 165 10*3/uL (ref 140–400)
RBC: 4.52 10*6/uL (ref 4.20–5.82)
RDW: 15 % — AB (ref 11.0–14.6)
WBC: 2.1 10*3/uL — AB (ref 4.0–10.3)

## 2015-07-18 LAB — COMPREHENSIVE METABOLIC PANEL
ALT: 11 U/L (ref 0–55)
ANION GAP: 8 meq/L (ref 3–11)
AST: 11 U/L (ref 5–34)
Albumin: 3.5 g/dL (ref 3.5–5.0)
Alkaline Phosphatase: 39 U/L — ABNORMAL LOW (ref 40–150)
BILIRUBIN TOTAL: 0.36 mg/dL (ref 0.20–1.20)
BUN: 12.6 mg/dL (ref 7.0–26.0)
CALCIUM: 9.2 mg/dL (ref 8.4–10.4)
CO2: 22 mEq/L (ref 22–29)
CREATININE: 1.1 mg/dL (ref 0.7–1.3)
Chloride: 109 mEq/L (ref 98–109)
EGFR: 79 mL/min/{1.73_m2} — ABNORMAL LOW (ref >90)
Glucose: 111 mg/dl (ref 70–140)
Potassium: 4 mEq/L (ref 3.5–5.1)
Sodium: 140 mEq/L (ref 136–145)
TOTAL PROTEIN: 6.3 g/dL — AB (ref 6.4–8.3)

## 2015-07-18 MED ORDER — FAMOTIDINE IN NACL 20-0.9 MG/50ML-% IV SOLN
20.0000 mg | Freq: Once | INTRAVENOUS | Status: AC
  Administered 2015-07-18: 20 mg via INTRAVENOUS

## 2015-07-18 MED ORDER — DIPHENHYDRAMINE HCL 50 MG/ML IJ SOLN
INTRAMUSCULAR | Status: AC
  Filled 2015-07-18: qty 1

## 2015-07-18 MED ORDER — FAMOTIDINE IN NACL 20-0.9 MG/50ML-% IV SOLN
INTRAVENOUS | Status: AC
  Filled 2015-07-18: qty 50

## 2015-07-18 MED ORDER — SODIUM CHLORIDE 0.9 % IV SOLN
190.0000 mg | Freq: Once | INTRAVENOUS | Status: AC
  Administered 2015-07-18: 190 mg via INTRAVENOUS
  Filled 2015-07-18: qty 19

## 2015-07-18 MED ORDER — SODIUM CHLORIDE 0.9 % IV SOLN
Freq: Once | INTRAVENOUS | Status: AC
  Administered 2015-07-18: 12:00:00 via INTRAVENOUS
  Filled 2015-07-18: qty 8

## 2015-07-18 MED ORDER — SODIUM CHLORIDE 0.9 % IJ SOLN
10.0000 mL | INTRAMUSCULAR | Status: DC | PRN
  Filled 2015-07-18: qty 10

## 2015-07-18 MED ORDER — PACLITAXEL CHEMO INJECTION 300 MG/50ML
45.0000 mg/m2 | Freq: Once | INTRAVENOUS | Status: AC
  Administered 2015-07-18: 84 mg via INTRAVENOUS
  Filled 2015-07-18: qty 14

## 2015-07-18 MED ORDER — DIPHENHYDRAMINE HCL 50 MG/ML IJ SOLN
50.0000 mg | Freq: Once | INTRAMUSCULAR | Status: AC
  Administered 2015-07-18: 50 mg via INTRAVENOUS

## 2015-07-18 MED ORDER — SODIUM CHLORIDE 0.9 % IV SOLN
Freq: Once | INTRAVENOUS | Status: AC
  Administered 2015-07-18: 11:00:00 via INTRAVENOUS

## 2015-07-18 MED ORDER — HEPARIN SOD (PORK) LOCK FLUSH 100 UNIT/ML IV SOLN
500.0000 [IU] | Freq: Once | INTRAVENOUS | Status: DC | PRN
  Filled 2015-07-18: qty 5

## 2015-07-18 NOTE — Patient Instructions (Signed)
[facility] Discharge Instructions for Patients Receiving Chemotherapy  Today you received the following chemotherapy agents Taxol/Carboplatin  To help prevent nausea and vomiting after your treatment, we encourage you to take your nausea medication    If you develop nausea and vomiting that is not controlled by your nausea medication, call the clinic.   BELOW ARE SYMPTOMS THAT SHOULD BE REPORTED IMMEDIATELY:  *FEVER GREATER THAN 100.5 F  *CHILLS WITH OR WITHOUT FEVER  NAUSEA AND VOMITING THAT IS NOT CONTROLLED WITH YOUR NAUSEA MEDICATION  *UNUSUAL SHORTNESS OF BREATH  *UNUSUAL BRUISING OR BLEEDING  TENDERNESS IN MOUTH AND THROAT WITH OR WITHOUT PRESENCE OF ULCERS  *URINARY PROBLEMS  *BOWEL PROBLEMS  UNUSUAL RASH Items with * indicate a potential emergency and should be followed up as soon as possible.  Feel free to call the clinic you have any questions or concerns. The clinic phone number is [phone].  Please show the CHEMO ALERT CARD at check-in to the Emergency Department and triage nurse.   

## 2015-07-19 ENCOUNTER — Ambulatory Visit
Admission: RE | Admit: 2015-07-19 | Discharge: 2015-07-19 | Disposition: A | Discharge status: Home / Self Care | Payer: Medicare HMO | Source: Ambulatory Visit | Attending: Radiation Oncology | Admitting: Radiation Oncology

## 2015-07-19 ENCOUNTER — Encounter: Payer: Self-pay | Admitting: Radiation Oncology

## 2015-07-19 VITALS — BP 137/75 | HR 123 | Resp 16 | Wt 177.4 lb

## 2015-07-19 DIAGNOSIS — C3411 Malignant neoplasm of upper lobe, right bronchus or lung: Secondary | ICD-10-CM

## 2015-07-19 DIAGNOSIS — Z510 Encounter for antineoplastic radiation therapy: Secondary | ICD-10-CM | POA: Diagnosis not present

## 2015-07-19 NOTE — Progress Notes (Signed)
  Radiation Oncology         [phone] ________________________________  Name: Alan [name] MRN: [id number]  Date: 07/19/2015  DOB: 1940/07/10  Weekly Radiation Therapy Management    ICD-9-CM ICD-10-CM   1. Malignant neoplasm of right upper lobe of lung (HCC) 162.3 C34.11      Current Dose: 40 Gy     Planned Dose:  60 Gy  Narrative . . . . . . . . The patient presents for routine under treatment assessment.                                   The patient is is having more fatigue and requested a temporary disability parking placard. He denies any hemoptysis or pain in the chest area. He has increased his Prilosec to twice a day in light of his esophageal symptoms.                                 Set-up films were reviewed.                                 The chart was checked. Physical Findings. . .  weight is 177 lb 6.4 oz (80.468 kg). His blood pressure is 137/75 and his pulse is 123. His respiration is 16 and oxygen saturation is 97%. . The lungs are clear. The heart has a regular rhythm and rate. No palpable supraclavicular or axillary adenopathy Impression . . . . . . . The patient is tolerating radiation. Plan . . . . . . . . . . . . Continue treatment as planned.  ________________________________   Blair Promise, PhD, MD

## 2015-07-19 NOTE — Progress Notes (Signed)
Patient has completed 20 fractions to his right chest. Weight and vitals stable. Denies pain. Reports an occasional dry cough. Denies shortness of breath or painful swallowing. Reports increased reflux. Reports he had chemotherapy yesterday. Reports mild manageable fatigue. Denies any skin irritation within treatment field. Reports using radiaplex as directed. Reports nocturia. Confirms he is taking [name] as directed. Requesting handicap placard.   BP 137/75 mmHg  Pulse 123  Resp 16  Wt 177 lb 6.4 oz (80.468 kg)  SpO2 97% Wt Readings from Last 3 Encounters:  07/19/15 177 lb 6.4 oz (80.468 kg)  07/12/15 182 lb 11.2 oz (82.872 kg)  07/11/15 176 lb 1.6 oz (79.878 kg)

## 2015-07-20 ENCOUNTER — Ambulatory Visit
Admission: RE | Admit: 2015-07-20 | Discharge: 2015-07-20 | Disposition: A | Discharge status: Home / Self Care | Payer: Medicare HMO | Source: Ambulatory Visit | Attending: Radiation Oncology | Admitting: Radiation Oncology

## 2015-07-20 DIAGNOSIS — Z510 Encounter for antineoplastic radiation therapy: Secondary | ICD-10-CM | POA: Diagnosis not present

## 2015-07-21 ENCOUNTER — Other Ambulatory Visit: Payer: Self-pay | Admitting: Pulmonary Disease

## 2015-07-21 ENCOUNTER — Ambulatory Visit
Admission: RE | Admit: 2015-07-21 | Discharge: 2015-07-21 | Disposition: A | Discharge status: Home / Self Care | Payer: Medicare HMO | Source: Ambulatory Visit | Attending: Radiation Oncology | Admitting: Radiation Oncology

## 2015-07-21 DIAGNOSIS — Z719 Counseling, unspecified: Secondary | ICD-10-CM

## 2015-07-21 DIAGNOSIS — Z510 Encounter for antineoplastic radiation therapy: Secondary | ICD-10-CM | POA: Diagnosis not present

## 2015-07-22 ENCOUNTER — Ambulatory Visit
Admission: RE | Admit: 2015-07-22 | Discharge: 2015-07-22 | Disposition: A | Discharge status: Home / Self Care | Payer: Medicare HMO | Source: Ambulatory Visit | Attending: Radiation Oncology | Admitting: Radiation Oncology

## 2015-07-22 DIAGNOSIS — Z510 Encounter for antineoplastic radiation therapy: Secondary | ICD-10-CM | POA: Diagnosis not present

## 2015-07-25 ENCOUNTER — Ambulatory Visit (HOSPITAL_BASED_OUTPATIENT_CLINIC_OR_DEPARTMENT_OTHER): Payer: Medicare HMO | Admitting: Oncology

## 2015-07-25 ENCOUNTER — Telehealth: Payer: Self-pay | Admitting: Internal Medicine

## 2015-07-25 ENCOUNTER — Encounter: Payer: Self-pay | Admitting: Oncology

## 2015-07-25 ENCOUNTER — Other Ambulatory Visit (HOSPITAL_BASED_OUTPATIENT_CLINIC_OR_DEPARTMENT_OTHER): Payer: Medicare HMO

## 2015-07-25 ENCOUNTER — Other Ambulatory Visit: Payer: Medicare HMO

## 2015-07-25 ENCOUNTER — Ambulatory Visit: Payer: Medicare HMO

## 2015-07-25 ENCOUNTER — Ambulatory Visit
Admission: RE | Admit: 2015-07-25 | Discharge: 2015-07-25 | Disposition: A | Discharge status: Home / Self Care | Payer: Medicare HMO | Source: Ambulatory Visit | Attending: Radiation Oncology | Admitting: Radiation Oncology

## 2015-07-25 VITALS — BP 124/74 | HR 109 | Temp 97.6°F | Resp 18 | Wt 177.3 lb

## 2015-07-25 DIAGNOSIS — Z510 Encounter for antineoplastic radiation therapy: Secondary | ICD-10-CM | POA: Diagnosis not present

## 2015-07-25 DIAGNOSIS — C3411 Malignant neoplasm of upper lobe, right bronchus or lung: Secondary | ICD-10-CM

## 2015-07-25 LAB — COMPREHENSIVE METABOLIC PANEL
ALBUMIN: 3.5 g/dL (ref 3.5–5.0)
ALK PHOS: 47 U/L (ref 40–150)
ALT: 11 U/L (ref 0–55)
ANION GAP: 8 meq/L (ref 3–11)
AST: 10 U/L (ref 5–34)
BILIRUBIN TOTAL: 0.4 mg/dL (ref 0.20–1.20)
BUN: 12.4 mg/dL (ref 7.0–26.0)
CO2: 22 mEq/L (ref 22–29)
Calcium: 9.1 mg/dL (ref 8.4–10.4)
Chloride: 109 mEq/L (ref 98–109)
Creatinine: 1.1 mg/dL (ref 0.7–1.3)
EGFR: 78 mL/min/{1.73_m2} — AB (ref >90)
Glucose: 139 mg/dl (ref 70–140)
Potassium: 3.9 mEq/L (ref 3.5–5.1)
Sodium: 139 mEq/L (ref 136–145)
TOTAL PROTEIN: 6.6 g/dL (ref 6.4–8.3)

## 2015-07-25 LAB — CBC WITH DIFFERENTIAL/PLATELET
BASO%: 2.2 % — ABNORMAL HIGH (ref 0.0–2.0)
BASOS ABS: 0 10*3/uL (ref 0.0–0.1)
EOS ABS: 0 10*3/uL (ref 0.0–0.5)
EOS%: 1.5 % (ref 0.0–7.0)
HCT: 40.1 % (ref 38.4–49.9)
HEMOGLOBIN: 13.8 g/dL (ref 13.0–17.1)
LYMPH#: 0.2 10*3/uL — AB (ref 0.9–3.3)
LYMPH%: 17.5 % (ref 14.0–49.0)
MCH: 29.9 pg (ref 27.2–33.4)
MCHC: 34.4 g/dL (ref 32.0–36.0)
MCV: 86.8 fL (ref 79.3–98.0)
MONO#: 0.2 10*3/uL (ref 0.1–0.9)
MONO%: 11.7 % (ref 0.0–14.0)
NEUT#: 0.9 10*3/uL — ABNORMAL LOW (ref 1.5–6.5)
NEUT%: 67.1 % (ref 39.0–75.0)
NRBC: 0 % (ref 0–0)
PLATELETS: 147 10*3/uL (ref 140–400)
RBC: 4.62 10*6/uL (ref 4.20–5.82)
RDW: 15.8 % — AB (ref 11.0–14.6)
WBC: 1.4 10*3/uL — ABNORMAL LOW (ref 4.0–10.3)

## 2015-07-25 LAB — TECHNOLOGIST REVIEW

## 2015-07-25 NOTE — Progress Notes (Signed)
[place] [phone]   [phone]  OFFICE PROGRESS NOTE  [name], MD [place] [address]  DIAGNOSIS: Stage IIIA (T1b, N2, M0) non-small cell lung cancer, adenocarcinoma presented with right right upper lobe nodule in addition to mediastinal lymphadenopathy diagnosed in November 2016.  PRIOR THERAPY: None.  CURRENT THERAPY: Concurrent chemoradiation with weekly carboplatin for AUC of 2 and paclitaxel 45 MG/M2. Status post 5 cycles.  INTERVAL HISTORY: Alan [name] 75 y.o. male returns to the clinic today for follow-up visit. The patient is currently undergoing concurrent chemoradiation with weekly carboplatin and paclitaxel status post 5 cycles. He is tolerating his treatment fairly well with no significant adverse effects. Reported that he has fallen twice over the past week. Does not recall the events prior to the fall. Indicates that he felt dizzy prior to the fall and he thinks his BP may have dropped. No injuries reported. Denies headaches and visual changes. He denied having any significant nausea or vomiting. He has no fever or chills. He denied having any significant chest pain, shortness of breath, cough or hemoptysis. He has no significant weight loss or night sweats. He complained of some hiccup recently and was treated with Thorazine The patient is here today to start cycle #6 of his concurrent chemoradiation.  MEDICAL HISTORY: Past Medical History  Diagnosis Date  . Sleep apnea     does not use cpap  . Unspecified essential hypertension   . Hypercholesteremia   . GERD (gastroesophageal reflux disease)   . Diverticulosis of colon (without mention of hemorrhage)   . Irritable bowel syndrome   . Benign neoplasm of colon     colon polyps  . Benign hypertrophy of prostate   . Erectile dysfunction   . Restless legs syndrome (RLS)   . Anxiety state, unspecified   . Onychomycosis   . Constipation, chronic   .  Hypothyroidism   . Cancer [facility])     prostate cancer  . Glaucoma     ALLERGIES:  is allergic to codeine.  MEDICATIONS:  Current Outpatient Prescriptions  Medication Sig Dispense Refill  . amLODipine (NORVASC) 5 MG tablet Take 1 tablet (5 mg total) by mouth every morning. 90 tablet 1  . aspirin EC 81 MG tablet Take 81 mg by mouth daily.     . brimonidine-timolol (COMBIGAN) 0.2-0.5 % ophthalmic solution Place 1 drop into both eyes every 12 (twelve) hours.    . chlorproMAZINE (THORAZINE) 25 MG tablet Take 1 tablet (25 mg total) by mouth 3 (three) times daily as needed for hiccoughs. 30 tablet 0  . finasteride ([name]) 5 MG tablet Take 5 mg by mouth daily.    . fluticasone (FLONASE) 50 MCG/ACT nasal spray Place 2 sprays into both nostrils daily. (Patient taking differently: Place 2 sprays into both nostrils daily as needed for allergies or rhinitis. ) 48 g 3  . lactulose (CHRONULAC) 10 GM/15ML solution Take 10 g by mouth 2 (two) times daily as needed (constipation).     [name] levothyroxine (SYNTHROID, LEVOTHROID) 88 MCG tablet TAKE ONE TABLET BY MOUTH ONCE DAILY 45 tablet 1  . Linaclotide (LINZESS) 290 MCG CAPS capsule Take 1 capsule (290 mcg total) by mouth daily. 30 capsule 3  . omeprazole (PRILOSEC) 20 MG capsule TAKE ONE CAPSULE BY MOUTH IN THE MORNING 90 capsule 0  . polyethylene glycol (MIRALAX / GLYCOLAX) packet Take 17 g by mouth daily as needed (constipation). Reported on 06/21/2015    .  prochlorperazine (COMPAZINE) 10 MG tablet Take 1 tablet (10 mg total) by mouth every 6 (six) hours as needed for nausea or vomiting. 30 tablet 0  . sildenafil (VIAGRA) 100 MG tablet Take 1 tablet (100 mg total) by mouth as directed. (Patient taking differently: Take 100 mg by mouth daily as needed for erectile dysfunction. ) 10 tablet 0  . simvastatin (ZOCOR) 20 MG tablet TAKE ONE TABLET BY MOUTH AT BEDTIME 90 tablet 0  . tamsulosin (FLOMAX) 0.4 MG CAPS capsule Take 1 capsule (0.4 mg total) by mouth every  evening. (Patient taking differently: Take 0.8 mg by mouth at bedtime. ) 90 capsule 3  . traMADol (ULTRAM) 50 MG tablet Take 1 tablet (50 mg total) by mouth every 6 (six) hours as needed for moderate pain. 20 tablet 0   No current facility-administered medications for this visit.    SURGICAL HISTORY:  Past Surgical History  Procedure Laterality Date  . None    . Colonoscopy    . Laparoscopic appendectomy N/A 01/27/2013    Procedure: APPENDECTOMY LAPAROSCOPIC;  Surgeon: Edward Jolly, MD;  Location: [facility];  Service: General;  Laterality: N/A;  . Appendectomy  12/2012  . Thyroidectomy    . Tonsillectomy    . Cardiac catheterization  ? 2008  . Video bronchoscopy N/A 05/31/2015    Procedure: VIDEO BRONCHOSCOPY;  Surgeon: Gaye Pollack, MD;  Location: [facility];  Service: Thoracic;  Laterality: N/A;  . Mediastinoscopy N/A 05/31/2015    Procedure: MEDIASTINOSCOPY;  Surgeon: Gaye Pollack, MD;  Location: [facility];  Service: Thoracic;  Laterality: N/A;    REVIEW OF SYSTEMS:  A comprehensive review of systems was negative.   PHYSICAL EXAMINATION: General appearance: alert, cooperative, fatigued and no distress Head: Normocephalic, without obvious abnormality, atraumatic Neck: no adenopathy, no JVD, supple, symmetrical, trachea midline and thyroid not enlarged, symmetric, no tenderness/mass/nodules Lymph nodes: Cervical, supraclavicular, and axillary nodes normal. Resp: clear to auscultation bilaterally Back: symmetric, no curvature. ROM normal. No CVA tenderness. Cardio: regular rate and rhythm, S1, S2 normal, no murmur, click, rub or gallop GI: soft, non-tender; bowel sounds normal; no masses,  no organomegaly Extremities: extremities normal, atraumatic, no cyanosis or edema  ECOG PERFORMANCE STATUS: 1 - Symptomatic but completely ambulatory  Blood pressure 124/74, pulse 109, temperature 97.6 F (36.4 C), temperature source Oral, resp. rate 18, weight 177 lb 4.8 oz (80.423 kg), SpO2 100  %.  LABORATORY DATA: Lab Results  Component Value Date   WBC 1.4* 07/25/2015   HGB 13.8 07/25/2015   HCT 40.1 07/25/2015   MCV 86.8 07/25/2015   PLT 147 07/25/2015      Chemistry      Component Value Date/Time   NA 139 07/25/2015 0830   NA 142 05/31/2015 0645   K 3.9 07/25/2015 0830   K 3.7 05/31/2015 0645   CL 110 05/31/2015 0645   CO2 22 07/25/2015 0830   CO2 25 05/31/2015 0645   BUN 12.4 07/25/2015 0830   BUN 8 05/31/2015 0645   CREATININE 1.1 07/25/2015 0830   CREATININE 1.03 05/31/2015 0645      Component Value Date/Time   CALCIUM 9.1 07/25/2015 0830   CALCIUM 9.1 05/31/2015 0645   ALKPHOS 47 07/25/2015 0830   ALKPHOS 42 05/31/2015 0645   AST 10 07/25/2015 0830   AST 15 05/31/2015 0645   ALT 11 07/25/2015 0830   ALT 17 05/31/2015 0645   BILITOT 0.40 07/25/2015 0830   BILITOT 0.4 05/31/2015 0645  RADIOGRAPHIC STUDIES: No results found.  ASSESSMENT AND PLAN: This is a very pleasant 75 year old African-American male recently diagnosed with a stage IIIA non-small cell lung cancer, adenocarcinoma with negative EGFR mutation, negative ALK gene translocation and negative ROS 1. He is currently undergoing a course of concurrent chemoradiation with weekly carboplatin and paclitaxel status post 5 cycles. The patient has been tolerating his treatment fairly well. ANC is 0.9 today.  The patient was seen and examined with Dr. [name]. Recommend that he hold chemotherapy today and next week. Radiation is scheduled to end on 08/02/15. He will have a CT scan in 6 weeks. Return visit in 6 weeks to review CT scan.   The patient was advised to call immediately if he has any concerning symptoms in the interval. The patient voices understanding of current disease status and treatment options and is in agreement with the current care plan.  All questions were answered. The patient knows to call the clinic with any problems, questions or concerns. We can certainly see the  patient much sooner if necessary.  Mikey Bussing, DNP, AGPCNP-BC, AOCNP  ADDENDUM: Hematology/Oncology Attending: I had a face to face encounter with the patient today. I recommended his care plan. This is a very pleasant 75 years old African-American male with a stage IIIa non-small cell lung cancer currently undergoing a course of concurrent chemoradiation with weekly carboplatin and paclitaxel is status post 5 cycles. The patient has been tolerating his treatment fairly well except for mild odynophagia. His absolute neutrophil count is low today and his last fraction of radiotherapy was scheduled for 08/02/2015. I recommended for the patient to skip this cycle of the chemotherapy but he will continue his radiotherapy as a scheduled. I would see him back for follow-up visit in 6 weeks for reevaluation after repeating CT scan of the chest for restaging of his disease. He was advised to call immediately if he has any concerning symptoms in the interval.  Disclaimer: This note was dictated with voice recognition software. Similar sounding words can inadvertently be transcribed and may be missed upon review.   Eilleen Kempf., MD 07/25/2015

## 2015-07-25 NOTE — Telephone Encounter (Signed)
per pof to sch pt appt-adv pt [facility] will call to sch scan-gave avs

## 2015-07-26 ENCOUNTER — Ambulatory Visit
Admission: RE | Admit: 2015-07-26 | Discharge: 2015-07-26 | Disposition: A | Discharge status: Home / Self Care | Payer: Medicare HMO | Source: Ambulatory Visit | Attending: Radiation Oncology | Admitting: Radiation Oncology

## 2015-07-26 ENCOUNTER — Encounter: Payer: Self-pay | Admitting: Radiation Oncology

## 2015-07-26 VITALS — BP 124/85 | HR 116 | Temp 97.5°F | Ht 66.0 in | Wt 174.7 lb

## 2015-07-26 DIAGNOSIS — Z510 Encounter for antineoplastic radiation therapy: Secondary | ICD-10-CM | POA: Diagnosis not present

## 2015-07-26 DIAGNOSIS — C3411 Malignant neoplasm of upper lobe, right bronchus or lung: Secondary | ICD-10-CM

## 2015-07-26 NOTE — Progress Notes (Signed)
  Radiation Oncology         [phone]) [phone] ________________________________  Name: Alan [name] MRN: [id number]  Date: 07/26/2015  DOB: 1940/10/22  Weekly Radiation Therapy Management    ICD-9-CM ICD-10-CM   1. Malignant neoplasm of right upper lobe of lung (HCC) 162.3 C34.11      Current Dose: 50 Gy     Planned Dose:  60 Gy  Narrative . . . . . . . . The patient presents for routine under treatment assessment.                                   The patient denies any significant fatigue or swallowing difficulties. He has heartburn type symptoms and takes Prilosec and Tums for this issue                                 Set-up films were reviewed.                                 The chart was checked. Physical Findings. . .  height is '5\' 6"'$  (1.676 m) and weight is 174 lb 11.2 oz (79.243 kg). His temperature is 97.5 F (36.4 C). His blood pressure is 124/85 and his pulse is 116. His oxygen saturation is 100%. .  The lungs are clear. The heart has a regular rhythm and rate. Impression . . . . . . . The patient is tolerating radiation. Plan . . . . . . . . . . . . Continue treatment as planned.  ________________________________   Blair Promise, PhD, MD

## 2015-07-26 NOTE — Progress Notes (Addendum)
[name] is here for his 25th fraction of radiation to his Right Chest. He denies any pain or fatigue. He does complain of constant heartburn which he states has increased since he started radiation. He is taking prilosec and an occasional Tums. He reports he has a good appetite and is eating well. He reports his skin is intact without tenderness, or redness and he is using sonafine occasionally. He denies any shortness of breath and his oxygen level is 100% on room air today.   BP 124/85 mmHg  Pulse 116  Temp(Src) 97.5 F (36.4 C)  Ht '5\' 6"'$  (1.676 m)  Wt 174 lb 11.2 oz (79.243 kg)  BMI 28.21 kg/m2  SpO2 100%   Wt Readings from Last 3 Encounters:  07/26/15 174 lb 11.2 oz (79.243 kg)  07/25/15 177 lb 4.8 oz (80.423 kg)  07/19/15 177 lb 6.4 oz (80.468 kg)

## 2015-07-27 ENCOUNTER — Ambulatory Visit
Admission: RE | Admit: 2015-07-27 | Discharge: 2015-07-27 | Disposition: A | Discharge status: Home / Self Care | Payer: Medicare HMO | Source: Ambulatory Visit | Attending: Radiation Oncology | Admitting: Radiation Oncology

## 2015-07-27 DIAGNOSIS — Z510 Encounter for antineoplastic radiation therapy: Secondary | ICD-10-CM | POA: Diagnosis not present

## 2015-07-28 ENCOUNTER — Ambulatory Visit
Admission: RE | Admit: 2015-07-28 | Discharge: 2015-07-28 | Disposition: A | Discharge status: Home / Self Care | Payer: Medicare HMO | Source: Ambulatory Visit | Attending: Radiation Oncology | Admitting: Radiation Oncology

## 2015-07-28 DIAGNOSIS — Z510 Encounter for antineoplastic radiation therapy: Secondary | ICD-10-CM | POA: Diagnosis not present

## 2015-07-29 ENCOUNTER — Ambulatory Visit
Admission: RE | Admit: 2015-07-29 | Discharge: 2015-07-29 | Disposition: A | Discharge status: Home / Self Care | Payer: Medicare HMO | Source: Ambulatory Visit | Attending: Radiation Oncology | Admitting: Radiation Oncology

## 2015-07-29 DIAGNOSIS — Z510 Encounter for antineoplastic radiation therapy: Secondary | ICD-10-CM | POA: Diagnosis not present

## 2015-08-01 ENCOUNTER — Other Ambulatory Visit: Payer: Medicare HMO

## 2015-08-01 ENCOUNTER — Ambulatory Visit
Admission: RE | Admit: 2015-08-01 | Discharge: 2015-08-01 | Disposition: A | Discharge status: Home / Self Care | Payer: Medicare HMO | Source: Ambulatory Visit | Attending: Radiation Oncology | Admitting: Radiation Oncology

## 2015-08-01 ENCOUNTER — Ambulatory Visit: Payer: Medicare HMO

## 2015-08-01 ENCOUNTER — Encounter: Payer: Self-pay | Admitting: Radiation Oncology

## 2015-08-01 VITALS — BP 122/64 | HR 121 | Temp 97.7°F | Resp 16 | Ht 66.0 in | Wt 171.0 lb

## 2015-08-01 DIAGNOSIS — C3411 Malignant neoplasm of upper lobe, right bronchus or lung: Secondary | ICD-10-CM

## 2015-08-01 DIAGNOSIS — Z510 Encounter for antineoplastic radiation therapy: Secondary | ICD-10-CM | POA: Diagnosis not present

## 2015-08-01 MED ORDER — SUCRALFATE 1 G PO TABS: 1.0000 g | ORAL_TABLET | Freq: Three times a day (TID) | ORAL | Status: AC

## 2015-08-01 NOTE — Progress Notes (Addendum)
Alan [name] has completed 29 fractions to his right chest.  He reports having burning/fullness in his mid chest.  He reports it worsened over the weekend and was only able to swallow liquids like smoothies and ensure.  He is wondering if he can get medication to help with this as it is his birthday and his daughter's this week.  He has lost 3 lbs since last week and his heart rate is elevated at 121.  He reports having an occasional dry cough.  He reports his shortness of breath is about the same.  He denies having fatigue.  He reports his last chemotherapy was canceled due to low blood counts.  ANC of 0.9 on 07/24/14 noted.   He reports having some redness of his upper back.  Patient was given a one month follow up appointment.  BP 122/64 mmHg  Pulse 121  Temp(Src) 97.7 F (36.5 C) (Oral)  Resp 16  Ht '5\' 6"'$  (1.676 m)  Wt 171 lb (77.565 kg)  BMI 27.61 kg/m2  SpO2 100%   Wt Readings from Last 3 Encounters:  08/01/15 171 lb (77.565 kg)  07/26/15 174 lb 11.2 oz (79.243 kg)  07/25/15 177 lb 4.8 oz (80.423 kg)

## 2015-08-01 NOTE — Patient Instructions (Signed)
Contact our office if you have any questions following today's appointment: [phone].  

## 2015-08-01 NOTE — Progress Notes (Signed)
Department of Radiation Oncology  Phone:  [phone] Fax:        [phone]  Weekly Treatment Note    Name: Alan [name] Date: 08/01/2015 MRN: [id number] DOB: 1940/09/26   Diagnosis:  No diagnosis found.   Current dose: 58 Gy  Current fraction: 29   MEDICATIONS: Current Outpatient Prescriptions  Medication Sig Dispense Refill  . amLODipine (NORVASC) 5 MG tablet Take 1 tablet (5 mg total) by mouth every morning. 90 tablet 1  . aspirin EC 81 MG tablet Take 81 mg by mouth daily.     . brimonidine-timolol (COMBIGAN) 0.2-0.5 % ophthalmic solution Place 1 drop into both eyes every 12 (twelve) hours.    . chlorproMAZINE (THORAZINE) 25 MG tablet Take 1 tablet (25 mg total) by mouth 3 (three) times daily as needed for hiccoughs. 30 tablet 0  . finasteride ([name]) 5 MG tablet Take 5 mg by mouth daily.    . fluticasone (FLONASE) 50 MCG/ACT nasal spray Place 2 sprays into both nostrils daily. (Patient taking differently: Place 2 sprays into both nostrils daily as needed for allergies or rhinitis. ) 48 g 3  . lactulose (CHRONULAC) 10 GM/15ML solution Take 10 g by mouth 2 (two) times daily as needed (constipation).     [name] levothyroxine (SYNTHROID, LEVOTHROID) 88 MCG tablet TAKE ONE TABLET BY MOUTH ONCE DAILY 45 tablet 1  . Linaclotide (LINZESS) 290 MCG CAPS capsule Take 1 capsule (290 mcg total) by mouth daily. 30 capsule 3  . omeprazole (PRILOSEC) 20 MG capsule TAKE ONE CAPSULE BY MOUTH IN THE MORNING 90 capsule 0  . polyethylene glycol (MIRALAX / GLYCOLAX) packet Take 17 g by mouth daily as needed (constipation). Reported on 06/21/2015    . prochlorperazine (COMPAZINE) 10 MG tablet Take 1 tablet (10 mg total) by mouth every 6 (six) hours as needed for nausea or vomiting. 30 tablet 0  . sildenafil (VIAGRA) 100 MG tablet Take 1 tablet (100 mg total) by mouth as directed. (Patient taking differently: Take 100 mg by mouth daily as needed for erectile dysfunction. ) 10 tablet 0  .  simvastatin (ZOCOR) 20 MG tablet TAKE ONE TABLET BY MOUTH AT BEDTIME 90 tablet 0  . tamsulosin (FLOMAX) 0.4 MG CAPS capsule Take 1 capsule (0.4 mg total) by mouth every evening. (Patient taking differently: Take 0.8 mg by mouth at bedtime. ) 90 capsule 3  . traMADol (ULTRAM) 50 MG tablet Take 1 tablet (50 mg total) by mouth every 6 (six) hours as needed for moderate pain. (Patient not taking: Reported on 08/01/2015) 20 tablet 0   No current facility-administered medications for this encounter.     ALLERGIES: Codeine   LABORATORY DATA:  Lab Results  Component Value Date   WBC 1.4* 07/25/2015   HGB 13.8 07/25/2015   HCT 40.1 07/25/2015   MCV 86.8 07/25/2015   PLT 147 07/25/2015   Lab Results  Component Value Date   NA 139 07/25/2015   K 3.9 07/25/2015   CL 110 05/31/2015   CO2 22 07/25/2015   Lab Results  Component Value Date   ALT 11 07/25/2015   AST 10 07/25/2015   ALKPHOS 47 07/25/2015   BILITOT 0.40 07/25/2015     NARRATIVE: [name] was seen today for weekly treatment management. The chart was checked and the patient's films were reviewed.  He has completed 29 fractions to his right chest. He reports having burning/fullness in his mid chest. He reports it worsened over the weekend and was  only able to swallow liquids like smoothies and ensure. He is wondering if he could get medication to help with this as it is his birthday and his daughter's this week. He has lost 3 lbs since last week and his heart rate is elevated at 121. He reports having an occasional dry cough. He reports his shortness of breath is about the same. He denies fatigue. He reports his last chemotherapy was cancelled due to low blood counts. ANC of 0.9 on 07/24/14 noted. He reports having some redness in his upper back.  PHYSICAL EXAMINATION: height is '5\' 6"'$  (1.676 m) and weight is 171 lb (77.565 kg). His oral temperature is 97.7 F (36.5 C). His blood pressure is 122/64 and his pulse is 121. His  respiration is 16 and oxygen saturation is 100%.   Pain scale 5/10 In general this is a well-appearing African-American male. Skin is dry and intact and no evidence of tenting is noted. Chest is clear to auscultation bilaterally and cardiovascular assessment reveals a regular rhythm and tachycardic rate, no clicks rubs or murmurs auscultated. HEENT reveals normocephalic atraumatic, EOMs are intact. Oral mucosa is intact without any evidence of plaques or ulcers. He declined skin exam of the posterior thorax.  ASSESSMENT: Stage IIIA, T1b and 2 right upper lobe cancer  PLAN: We will continue with the patient's radiation treatment as planned. He seems to be tolerating his regimen very well, despite his new onset of symptoms. He completes radiation treatment tomorrow and was given a one month follow up appointment. He will be prescribed Carafate to help with his difficulty swallowing. I encouraged him to keep up his fluid intake. If he is unable to do so, he will let us know and we would consider IV hydration.   Jodelle Gross, MD, PhD  This document serves as a record of services personally performed by Kyung Rudd, MD. It was created on his behalf by Darcus Austin, a trained medical scribe. The creation of this record is based on the scribe's personal observations and the provider's statements to them. This document has been checked and approved by the attending provider.

## 2015-08-02 ENCOUNTER — Encounter: Payer: Self-pay | Admitting: Radiation Oncology

## 2015-08-02 ENCOUNTER — Ambulatory Visit: Payer: Medicare HMO

## 2015-08-02 ENCOUNTER — Ambulatory Visit
Admission: RE | Admit: 2015-08-02 | Discharge: 2015-08-02 | Disposition: A | Discharge status: Home / Self Care | Payer: Medicare HMO | Source: Ambulatory Visit | Attending: Radiation Oncology | Admitting: Radiation Oncology

## 2015-08-02 VITALS — BP 117/88 | HR 116 | Temp 97.8°F

## 2015-08-02 DIAGNOSIS — Z510 Encounter for antineoplastic radiation therapy: Secondary | ICD-10-CM | POA: Diagnosis not present

## 2015-08-02 DIAGNOSIS — C3411 Malignant neoplasm of upper lobe, right bronchus or lung: Secondary | ICD-10-CM

## 2015-08-02 NOTE — Progress Notes (Signed)
  Radiation Oncology         [phone]) [phone] ________________________________  Name: Alan [name] MRN: [id number]  Date: 08/02/2015  DOB: [birth year]  Weekly Radiation Therapy Management    ICD-9-CM ICD-10-CM   1. Malignant neoplasm of right upper lobe of lung (HCC) 162.3 C34.11      Current Dose: 60 Gy     Planned Dose:  60 Gy  Narrative . . . . . . . . The patient presents for routine under treatment assessment.                                   The patient is without complaint. He developed more esophageal symptoms and was seen yesterday. He is been started on Carafate which has been helpful for him. He denies any breathing problems or significant fatigue or skin issues.                                 Set-up films were reviewed.                                 The chart was checked. Physical Findings. . .  temperature is 97.8 F (36.6 C). His blood pressure is 117/88 and his pulse is 116. . The lungs are clear. The heart has a regular rhythm with a slightly increased rate. Impression . . . . . . . The patient is tolerating radiation. Plan . . . . . . . . . . . . Routine follow-up in one month. The patient will continue using Carafate in the next couple of weeks.  ________________________________   Blair Promise, PhD, MD

## 2015-08-02 NOTE — Progress Notes (Signed)
Alan [name] is here for his last treatment of radiation to his Lung. He feels much better after receiving carafate yesterday for burning/fullness while swallowing. He does complain of pain a 2/10 in his mid chest, upper abdomen area which he is taking the new carafate for and no other pain medicine. He reports he is eating as much as he can, but states it may take him a couple of days to get back to normal. He reports he is breathing well without any shortness of breath.   BP 117/88 mmHg  Pulse 116  Temp(Src) 97.8 F (36.6 C)   Wt Readings from Last 3 Encounters:  08/01/15 171 lb (77.565 kg)  07/26/15 174 lb 11.2 oz (79.243 kg)  07/25/15 177 lb 4.8 oz (80.423 kg)

## 2015-08-03 ENCOUNTER — Ambulatory Visit: Payer: Medicare HMO

## 2015-08-06 ENCOUNTER — Telehealth: Payer: Self-pay

## 2015-08-06 NOTE — Congregational Nurse Program (Signed)
Congregational Nurse Program Note  Date of Encounter: 07/17/2015  Past Medical History: Past Medical History  Diagnosis Date  . Sleep apnea     does not use cpap  . Unspecified essential hypertension   . Hypercholesteremia   . GERD (gastroesophageal reflux disease)   . Diverticulosis of colon (without mention of hemorrhage)   . Irritable bowel syndrome   . Benign neoplasm of colon     colon polyps  . Benign hypertrophy of prostate   . Erectile dysfunction   . Restless legs syndrome (RLS)   . Anxiety state, unspecified   . Onychomycosis   . Constipation, chronic   . Hypothyroidism   . Cancer [facility])     prostate cancer  . Glaucoma     Encounter Details:     CNP Questionnaire - 07/17/15 2325    Patient Demographics   Is this a new or existing patient? Existing   Patient is considered a/an Not Applicable   Race African-American/Black   Patient Assistance   Location of Patient Assistance [employer]   Patient's financial/insurance status Low Income;Medicare   Uninsured Patient No   Patient referred to apply for the following financial assistance Not Applicable   Food insecurities addressed Not Applicable   Transportation assistance No   Assistance securing medications No   Educational health offerings Cancer;Safety   Encounter Details   Primary purpose of visit Education/Health Concerns   Was an Emergency Department visit averted? No   Does patient have a medical provider? Yes   Patient referred to Not Applicable   Was a mental health screening completed? (GAINS tool) No   Does patient have dental issues? No   Since previous encounter, have you referred patient for abnormal blood pressure that resulted in a new diagnosis or medication change? No   Since previous encounter, have you referred patient for abnormal blood glucose that resulted in a new diagnosis or medication change? No   For Abstraction Use Only   Does patient have insurance? Yes       Client  reports feeling well and continues to have good appetite.  No nausea.  Has expected more problems than this.  Did have mild SOB while in the pulpit .  Has continued to want to be participant in church program.  Encouraged to be careful with exposure to those ill.

## 2015-08-08 ENCOUNTER — Ambulatory Visit: Payer: Medicare HMO | Admitting: Internal Medicine

## 2015-08-08 ENCOUNTER — Ambulatory Visit: Payer: Medicare HMO

## 2015-08-08 ENCOUNTER — Other Ambulatory Visit: Payer: Medicare HMO

## 2015-08-08 NOTE — Progress Notes (Signed)
  Radiation Oncology         [phone]) [phone] ________________________________  Name: Alan [name] MRN: [id number]  Date: 08/02/2015  DOB: Nov 23, 1940  End of Treatment Note  ICD-9-CM ICD-10-CM     1. Malignant neoplasm of right upper lobe of lung (HCC) 162.3 C34.11     DIAGNOSIS: Stage III-a Non-Small Cell Lung Cancer     Indication for treatment:  Definitive treatment along with radiosensitizing chemotherapy       Radiation treatment dates:   06/20/2015-08/02/2015  Site/dose:   Right upper lung region 60 gray in 30 fractions  Beams/energy:   3-D conformal therapy, multiple beams  Narrative: The patient tolerated radiation treatment relatively well.   He experienced some fatigue and esophageal symptoms towards end of his treatment. No problems with breathing  Plan: The patient has completed radiation treatment. The patient will return to radiation oncology clinic for routine followup in one month. I advised them to call or return sooner if they have any questions or concerns related to their recovery or treatment.  -----------------------------------  Blair Promise, PhD, MD

## 2015-08-09 NOTE — Congregational Nurse Program (Signed)
Congregational Nurse Program Note  Date of Encounter: 07/21/2015  Past Medical History: Past Medical History  Diagnosis Date  . Sleep apnea     does not use cpap  . Unspecified essential hypertension   . Hypercholesteremia   . GERD (gastroesophageal reflux disease)   . Diverticulosis of colon (without mention of hemorrhage)   . Irritable bowel syndrome   . Benign neoplasm of colon     colon polyps  . Benign hypertrophy of prostate   . Erectile dysfunction   . Restless legs syndrome (RLS)   . Anxiety state, unspecified   . Onychomycosis   . Constipation, chronic   . Hypothyroidism   . Cancer [facility])     prostate cancer  . Glaucoma     Encounter Details:     CNP Questionnaire - 07/21/15 0014    Patient Demographics   Is this a new or existing patient? Existing   Patient is considered a/an Not Applicable   Race African-American/Black   Patient Assistance   Location of Patient Assistance [employer]   Patient's financial/insurance status Low Income;Medicare   Uninsured Patient No   Patient referred to apply for the following financial assistance Not Applicable   Food insecurities addressed Not Applicable   Transportation assistance No   Assistance securing medications No   Educational health offerings Cancer;Safety   Encounter Details   Primary purpose of visit Education/Health Concerns   Was an Emergency Department visit averted? No   Does patient have a medical provider? Yes   Patient referred to Not Applicable   Was a mental health screening completed? (GAINS tool) No   Does patient have dental issues? No   Since previous encounter, have you referred patient for abnormal blood pressure that resulted in a new diagnosis or medication change? No   Since previous encounter, have you referred patient for abnormal blood glucose that resulted in a new diagnosis or medication change? No   For Abstraction Use Only   Does patient have insurance? Yes       HV to  client, he reports he had an episode of fainting and fell at church.  Had some scrapes but did not get injured that he had to seek treatment.  No episode since.  Recommended he call his PCP or consult with his dr at cancer center.  Encouraged to seek medical care if happens again.

## 2015-08-10 ENCOUNTER — Encounter: Payer: Self-pay | Admitting: Skilled Nursing Facility1

## 2015-08-10 NOTE — Progress Notes (Signed)
Subjective:     Patient ID: Alan [name], male   DOB: 07-07-1940, 75 y.o.   MRN: [id number]  HPI   Review of Systems     Objective:   Physical Exam To assist the pt in identifying some dietary strategies to gain lost wt.    Assessment:     Pt identified as being malnourished due to lost wt. Pt was contacted via the telephone at [phone]. Pt sounds great. Pt states he does not have an apetitie but he is making every effort to eat something or have ensure, protein shake, or green shake. Pt states he is improving every day and he still has his strength and energy.     Plan:     Dietitian encouraged the pt and congratulated the pt on his efforts to keep eating. Dietitian advised to keep eating and if he had any questions to call [phone].

## 2015-08-20 ENCOUNTER — Telehealth: Payer: Self-pay

## 2015-08-20 NOTE — Telephone Encounter (Signed)
TC with client to follow up on his current status.  Reports he had difficulty eating and has been having burning on swallowing.  He' \\has'$  had prescribed meds for this and is also taking Mylanta and this has been more helpful.  He is feeling better since his radiation has been discontinued on August 02, 2015.  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  Has had a friend pass away today.  This friend was a Theme park manager.  Client was with him everyday for the past week.  Spoke about this and was glad he was there for friend.  Continues to be hopeful and upbeat.  Will have more testing March 9 when he goes back for followup.  Will continue to follow client.

## 2015-08-21 DIAGNOSIS — Z719 Counseling, unspecified: Secondary | ICD-10-CM

## 2015-08-31 ENCOUNTER — Telehealth: Payer: Self-pay | Admitting: Internal Medicine

## 2015-08-31 NOTE — Telephone Encounter (Signed)
Faxed pt medical records to [facility] [phone]

## 2015-09-05 ENCOUNTER — Other Ambulatory Visit (HOSPITAL_BASED_OUTPATIENT_CLINIC_OR_DEPARTMENT_OTHER): Payer: Medicare HMO

## 2015-09-05 ENCOUNTER — Encounter: Payer: Self-pay | Admitting: Oncology

## 2015-09-05 ENCOUNTER — Ambulatory Visit (HOSPITAL_COMMUNITY)
Admission: RE | Admit: 2015-09-05 | Discharge: 2015-09-05 | Disposition: A | Discharge status: Home / Self Care | Payer: Medicare HMO | Source: Ambulatory Visit | Attending: Oncology | Admitting: Oncology

## 2015-09-05 ENCOUNTER — Other Ambulatory Visit: Payer: Self-pay | Admitting: Pulmonary Disease

## 2015-09-05 DIAGNOSIS — R918 Other nonspecific abnormal finding of lung field: Secondary | ICD-10-CM | POA: Insufficient documentation

## 2015-09-05 DIAGNOSIS — C3411 Malignant neoplasm of upper lobe, right bronchus or lung: Secondary | ICD-10-CM | POA: Diagnosis present

## 2015-09-05 DIAGNOSIS — N6489 Other specified disorders of breast: Secondary | ICD-10-CM | POA: Diagnosis not present

## 2015-09-05 LAB — CBC WITH DIFFERENTIAL/PLATELET
BASO%: 1.1 % (ref 0.0–2.0)
BASOS ABS: 0 10*3/uL (ref 0.0–0.1)
EOS ABS: 0.1 10*3/uL (ref 0.0–0.5)
EOS%: 2 % (ref 0.0–7.0)
HCT: 42.1 % (ref 38.4–49.9)
HEMOGLOBIN: 13.9 g/dL (ref 13.0–17.1)
LYMPH%: 31.9 % (ref 14.0–49.0)
MCH: 29.9 pg (ref 27.2–33.4)
MCHC: 33.1 g/dL (ref 32.0–36.0)
MCV: 90.1 fL (ref 79.3–98.0)
MONO#: 0.4 10*3/uL (ref 0.1–0.9)
MONO%: 13 % (ref 0.0–14.0)
NEUT#: 1.6 10*3/uL (ref 1.5–6.5)
NEUT%: 52 % (ref 39.0–75.0)
Platelets: 227 10*3/uL (ref 140–400)
RBC: 4.67 10*6/uL (ref 4.20–5.82)
RDW: 16.9 % — AB (ref 11.0–14.6)
WBC: 3 10*3/uL — ABNORMAL LOW (ref 4.0–10.3)
lymph#: 1 10*3/uL (ref 0.9–3.3)

## 2015-09-05 LAB — COMPREHENSIVE METABOLIC PANEL
ALBUMIN: 3.7 g/dL (ref 3.5–5.0)
ALK PHOS: 51 U/L (ref 40–150)
ALT: 10 U/L (ref 0–55)
AST: 12 U/L (ref 5–34)
Anion Gap: 9 mEq/L (ref 3–11)
BUN: 19.4 mg/dL (ref 7.0–26.0)
CALCIUM: 9.4 mg/dL (ref 8.4–10.4)
CO2: 24 mEq/L (ref 22–29)
Chloride: 108 mEq/L (ref 98–109)
Creatinine: 1.1 mg/dL (ref 0.7–1.3)
EGFR: 74 mL/min/{1.73_m2} — AB (ref >90)
GLUCOSE: 93 mg/dL (ref 70–140)
POTASSIUM: 3.6 meq/L (ref 3.5–5.1)
SODIUM: 141 meq/L (ref 136–145)
Total Bilirubin: 0.51 mg/dL (ref 0.20–1.20)
Total Protein: 6.8 g/dL (ref 6.4–8.3)

## 2015-09-05 MED ORDER — IOHEXOL 300 MG/ML  SOLN
75.0000 mL | Freq: Once | INTRAMUSCULAR | Status: AC | PRN
  Administered 2015-09-05: 75 mL via INTRAVENOUS

## 2015-09-08 ENCOUNTER — Ambulatory Visit
Admission: RE | Admit: 2015-09-08 | Discharge: 2015-09-08 | Disposition: A | Discharge status: Home / Self Care | Payer: Medicare HMO | Source: Ambulatory Visit | Attending: Radiation Oncology | Admitting: Radiation Oncology

## 2015-09-08 ENCOUNTER — Encounter: Payer: Self-pay | Admitting: Radiation Oncology

## 2015-09-08 VITALS — BP 113/68 | HR 105 | Temp 98.1°F | Resp 16 | Wt 167.5 lb

## 2015-09-08 DIAGNOSIS — C3411 Malignant neoplasm of upper lobe, right bronchus or lung: Secondary | ICD-10-CM

## 2015-09-08 NOTE — Progress Notes (Signed)
Radiation Oncology         [phone] ________________________________  Name: Alan [name] MRN: [id number]  Date: 09/08/2015  DOB: October 19, 1940  Follow-Up Visit Note  CC: [name], MD  Gaye Pollack, MD  No diagnosis found.  Diagnosis: Stage III-a Non-Small Cell Lung Cancer  Indication for treatment: Definitive treatment along with radiosensitizing chemotherapy   Radiation treatment dates: 06/20/2015-08/02/2015   Site/dose: Right upper lung region 60 gray in 30 fractions  Narrative:  The patient returns today for routine follow-up.  PAIN: He rates his pain as a 3 on a scale of 0-10. constant, sharp and "deep in the muscle" over left hip, started last week. No none injuries.  He fell once in February due to becoming imbalance, no injuries, didn't hit his head.  RESPIRATORY: Shortness of Breath Walking and Coughing Dry, Productive and Color of Phlegm white. Pt is on room air.  Noted slight hyperpigmentation to upper back, but it is fading. He also reports occasional pruritus to site. He applies lotion as needed.  SWALLOWING/DIET: Pt denies dysphagia. Reflux has subsided.  OTHER: CT of chest on 09/05/15. Follow up with [facility] 09/12/15 BP 113/68 mmHg  Pulse 105  Temp(Src) 98.1 F (36.7 C) (Oral)  Resp 16  Wt 167 lb 8 oz (75.978 kg)  SpO2 99% Wt Readings from Last 3 Encounters:  09/08/15 167 lb 8 oz (75.978 kg)  08/01/15 171 lb (77.565 kg)  07/26/15 174 lb 11.2 oz (79.243 kg)       ALLERGIES:  is allergic to codeine.  Meds: Current Outpatient Prescriptions  Medication Sig Dispense Refill  . amLODipine (NORVASC) 5 MG tablet Take 1 tablet (5 mg total) by mouth every morning. 90 tablet 1  . aspirin EC 81 MG tablet Take 81 mg by mouth daily.     . brimonidine-timolol (COMBIGAN) 0.2-0.5 % ophthalmic solution Place 1 drop into both eyes every 12 (twelve) hours.    . chlorproMAZINE (THORAZINE) 25 MG tablet Take 1 tablet (25 mg total) by mouth 3 (three)  times daily as needed for hiccoughs. 30 tablet 0  . finasteride ([name]) 5 MG tablet Take 5 mg by mouth daily.    . fluticasone (FLONASE) 50 MCG/ACT nasal spray Place 2 sprays into both nostrils daily. (Patient taking differently: Place 2 sprays into both nostrils daily as needed for allergies or rhinitis. ) 48 g 3  . lactulose (CHRONULAC) 10 GM/15ML solution Take 10 g by mouth 2 (two) times daily as needed (constipation).     [name] levothyroxine (SYNTHROID, LEVOTHROID) 88 MCG tablet TAKE ONE TABLET BY MOUTH ONCE DAILY 45 tablet 1  . Linaclotide (LINZESS) 290 MCG CAPS capsule Take 1 capsule (290 mcg total) by mouth daily. 30 capsule 3  . omeprazole (PRILOSEC) 20 MG capsule TAKE ONE CAPSULE BY MOUTH IN THE MORNING 90 capsule 0  . polyethylene glycol (MIRALAX / GLYCOLAX) packet Take 17 g by mouth daily as needed (constipation). Reported on 06/21/2015    . prochlorperazine (COMPAZINE) 10 MG tablet Take 1 tablet (10 mg total) by mouth every 6 (six) hours as needed for nausea or vomiting. 30 tablet 0  . sildenafil (VIAGRA) 100 MG tablet Take 1 tablet (100 mg total) by mouth as directed. (Patient taking differently: Take 100 mg by mouth daily as needed for erectile dysfunction. ) 10 tablet 0  . simvastatin (ZOCOR) 20 MG tablet TAKE ONE TABLET BY MOUTH AT BEDTIME 90 tablet 0  . sucralfate (CARAFATE) 1 g tablet Take 1 tablet (1  g total) by mouth 4 (four) times daily -  with meals and at bedtime. Mix in 2Tbs water, swish and swallow 60 tablet 0  . tamsulosin (FLOMAX) 0.4 MG CAPS capsule Take 1 capsule (0.4 mg total) by mouth every evening. (Patient taking differently: Take 0.8 mg by mouth at bedtime. ) 90 capsule 3  . traMADol (ULTRAM) 50 MG tablet Take 1 tablet (50 mg total) by mouth every 6 (six) hours as needed for moderate pain. (Patient not taking: Reported on 08/01/2015) 20 tablet 0   No current facility-administered medications for this encounter.   Physical Findings: The patient is in no acute distress.  Patient is alert and oriented.  weight is 167 lb 8 oz (75.978 kg). His oral temperature is 98.1 F (36.7 C). His blood pressure is 113/68 and his pulse is 105. His respiration is 16 and oxygen saturation is 99%. .  No significant changes. No palpable cervical, supraclavicular or axillary lymphoadenopathy. The heart has a regular rate and rhythm. The lungs are clear to auscultation.   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 EXAM: CT CHEST WITH CONTRAST TECHNIQUE: Multidetector CT imaging of the chest was performed during intravenous contrast administration. CONTRAST:  47m OMNIPAQUE IOHEXOL 300 MG/ML  SOLN COMPARISON:  05/19/2015 PET-CT. FINDINGS: Mediastinum/Nodes: Normal heart size. No pericardial fluid/thickening. Great vessels are normal in course and caliber. No central pulmonary emboli. Thyroid appears surgically absent. Normal esophagus. No axillary adenopathy. No residual pathologically enlarged mediastinal or hilar nodes. Lungs/Pleura: No pneumothorax. No pleural effusion. Posterior right upper lobe spiculated 2.1 x 1.3 cm pulmonary nodule (series 5/ image 12) is decreased from 2.8 x 2.0 cm on 05/19/2015. Subsolid 1.0 x 0.6 cm apical left upper lobe pulmonary nodule (series 5/ image 10) previously measured 1.0 x 0.7 cm, not appreciably changed. There are at least 5 additional subcentimeter pulmonary nodules scattered throughout both lungs, largest 6 mm in the right upper lobe (series 5/ image 27), all unchanged. No acute consolidative airspace disease or new significant pulmonary nodules. Upper abdomen: Simple 0.6 cm posterior upper right renal cyst. Stable mild irregular thickening of both adrenal glands  without discrete adrenal nodule, suggesting mild adrenal hyperplasia. Musculoskeletal: No aggressive appearing focal osseous lesions. Mild degenerative changes in the thoracic spine. Stable prominently asymmetric retroareolar soft tissue in the left breast, probably asymmetric gynecomastia. IMPRESSION: 1. Partial interval treatment response in the primary right upper lobe neoplasm. 2. No residual thoracic lymphadenopathy. No new or progressive metastatic disease in the chest. 3. Subsolid 1.0 cm apical left upper lobe pulmonary nodule is stable. Additional scattered subcentimeter pulmonary nodules are stable and probably benign. 4. Stable prominently asymmetric retroareolar soft tissue in the left breast, probably symmetric gynecomastia, correlate with clinical findings. Electronically Signed   By: JIlona SorrelM.D.   On: 09/05/2015 09:27   Impression:  The patient is recovering from the effects of radiation. On 09/05/15 CT scan, shows positive response to treatment as noted above.   Plan: Advised for continued carafate use. Follow up in radiation oncology in 3 months.  I anticipate additional chemotherapy or surgical options for treatment of the upper lobe neoplasm.  He will meet with med/onc next week.  -----------------------------------  Blair Promise, PhD, MD  This document serves as a record of services personally performed by Gery Pray, MD. It was created on his behalf by Derek Mound, a trained medical scribe. The creation of this record is based on the scribe's personal observations and the provider's statements to them. This document has been checked and approved by the attending provider.

## 2015-09-08 NOTE — Progress Notes (Signed)
PAIN: He rates his pain as a 3 on a scale of 0-10. constant, sharp and "deep in the muscle" over left hip, started last week. No none injuries.  He feel once in February due to becoming imbalance, no injuries, didn't hit his head.  RESPIRATORY: Shortness of Breath  Walking and Coughing  Dry, Productive and Color of Phlegm  white. Pt is on room air. Noted slight hyperpigmentation to upper back, but it is fading.  He also reports occasional pruritus to site.  He applies lotion as needed.  SWALLOWING/DIET: Pt denies dysphagia. Reflux has subsided.  OTHER: CT of chest on 09/05/15. Follow up with [facility] 09/12/15 BP 113/68 mmHg  Pulse 105  Temp(Src) 98.1 F (36.7 C) (Oral)  Resp 16  Wt 167 lb 8 oz (75.978 kg)  SpO2 99% Wt Readings from Last 3 Encounters:  09/08/15 167 lb 8 oz (75.978 kg)  08/01/15 171 lb (77.565 kg)  07/26/15 174 lb 11.2 oz (79.243 kg)

## 2015-09-11 DIAGNOSIS — Z719 Counseling, unspecified: Secondary | ICD-10-CM

## 2015-09-12 ENCOUNTER — Encounter: Payer: Self-pay | Admitting: Internal Medicine

## 2015-09-12 ENCOUNTER — Ambulatory Visit (HOSPITAL_BASED_OUTPATIENT_CLINIC_OR_DEPARTMENT_OTHER): Payer: Medicare HMO | Admitting: Internal Medicine

## 2015-09-12 VITALS — BP 110/68 | HR 102 | Temp 97.9°F | Resp 18 | Ht 66.0 in | Wt 167.5 lb

## 2015-09-12 DIAGNOSIS — C3411 Malignant neoplasm of upper lobe, right bronchus or lung: Secondary | ICD-10-CM | POA: Diagnosis not present

## 2015-09-12 NOTE — Progress Notes (Signed)
[place] Telephone:([phone]) [phone]   Fax:([phone]  OFFICE PROGRESS NOTE  [name], MD [place] [address]  DIAGNOSIS: Stage IIIA (T1b, N2, M0) non-small cell lung cancer, adenocarcinoma presented with right right upper lobe nodule in addition to mediastinal lymphadenopathy diagnosed in November 2016.  PRIOR THERAPY: Concurrent chemoradiation with weekly carboplatin for AUC of 2 and paclitaxel 45 MG/M2. Status post 5 cycles. Last dose was given 07/18/2015 with partial response. [name]  CURRENT THERAPY: None.  INTERVAL HISTORY: Alan [name] 75 y.o. male returns to the clinic today for follow-up visit accompanied by his wife. The patient completed a course of concurrent chemoradiation with weekly carboplatin and paclitaxel status post 5 cycles. He tolerated his treatment fairly well with no significant adverse effects. He denied having any significant nausea or vomiting. He has no fever or chills. He denied having any significant chest pain, shortness of breath, cough or hemoptysis. He has no significant weight loss or night sweats. He had repeat CT scan of the chest performed recently and he is here for evaluation and discussion of his scan results.  MEDICAL HISTORY: Past Medical History  Diagnosis Date  . Sleep apnea     does not use cpap  . Unspecified essential hypertension   . Hypercholesteremia   . GERD (gastroesophageal reflux disease)   . Diverticulosis of colon (without mention of hemorrhage)   . Irritable bowel syndrome   . Benign neoplasm of colon     colon polyps  . Benign hypertrophy of prostate   . Erectile dysfunction   . Restless legs syndrome (RLS)   . Anxiety state, unspecified   . Onychomycosis   . Constipation, chronic   . Hypothyroidism   . Cancer [facility])     prostate cancer  . Glaucoma   . Radiation 06/20/15-08/02/15    right upper lung region 60 gray    ALLERGIES:  is allergic to codeine.  MEDICATIONS:    Current Outpatient Prescriptions  Medication Sig Dispense Refill  . amLODipine (NORVASC) 5 MG tablet Take 1 tablet (5 mg total) by mouth every morning. 90 tablet 1  . aspirin EC 81 MG tablet Take 81 mg by mouth daily.     . brimonidine-timolol (COMBIGAN) 0.2-0.5 % ophthalmic solution Place 1 drop into both eyes every 12 (twelve) hours.    . finasteride ([name]) 5 MG tablet Take 5 mg by mouth daily.    . fluticasone (FLONASE) 50 MCG/ACT nasal spray Place 2 sprays into both nostrils daily. (Patient taking differently: Place 2 sprays into both nostrils daily as needed for allergies or rhinitis. ) 48 g 3  . lactulose (CHRONULAC) 10 GM/15ML solution Take 10 g by mouth 2 (two) times daily as needed (constipation).     [name] levothyroxine (SYNTHROID, LEVOTHROID) 88 MCG tablet TAKE ONE TABLET BY MOUTH ONCE DAILY 45 tablet 1  . Linaclotide (LINZESS) 290 MCG CAPS capsule Take 1 capsule (290 mcg total) by mouth daily. 30 capsule 3  . omeprazole (PRILOSEC) 20 MG capsule TAKE ONE CAPSULE BY MOUTH IN THE MORNING 90 capsule 0  . polyethylene glycol (MIRALAX / GLYCOLAX) packet Take 17 g by mouth daily as needed (constipation). Reported on 06/21/2015    . simvastatin (ZOCOR) 20 MG tablet TAKE ONE TABLET BY MOUTH AT BEDTIME 90 tablet 0  . tamsulosin (FLOMAX) 0.4 MG CAPS capsule Take 1 capsule (0.4 mg total) by mouth every evening. (Patient taking differently: Take 0.8 mg by mouth at  bedtime. ) 90 capsule 3  . chlorproMAZINE (THORAZINE) 25 MG tablet Take 1 tablet (25 mg total) by mouth 3 (three) times daily as needed for hiccoughs. (Patient not taking: Reported on 09/12/2015) 30 tablet 0  . prochlorperazine (COMPAZINE) 10 MG tablet Take 1 tablet (10 mg total) by mouth every 6 (six) hours as needed for nausea or vomiting. 30 tablet 0  . sildenafil (VIAGRA) 100 MG tablet Take 1 tablet (100 mg total) by mouth as directed. (Patient not taking: Reported on 09/12/2015) 10 tablet 0  . sucralfate (CARAFATE) 1 g tablet Take 1  tablet (1 g total) by mouth 4 (four) times daily -  with meals and at bedtime. Mix in 2Tbs water, swish and swallow (Patient not taking: Reported on 09/12/2015) 60 tablet 0  . traMADol (ULTRAM) 50 MG tablet Take 1 tablet (50 mg total) by mouth every 6 (six) hours as needed for moderate pain. (Patient not taking: Reported on 08/01/2015) 20 tablet 0   No current facility-administered medications for this visit.    SURGICAL HISTORY:  Past Surgical History  Procedure Laterality Date  . None    . Colonoscopy    . Laparoscopic appendectomy N/A 01/27/2013    Procedure: APPENDECTOMY LAPAROSCOPIC;  Surgeon: Edward Jolly, MD;  Location: [facility] ORS;  Service: General;  Laterality: N/A;  . Appendectomy  12/2012  . Thyroidectomy    . Tonsillectomy    . Cardiac catheterization  ? 2008  . Video bronchoscopy N/A 05/31/2015    Procedure: VIDEO BRONCHOSCOPY;  Surgeon: Gaye Pollack, MD;  Location: [facility] OR;  Service: Thoracic;  Laterality: N/A;  . Mediastinoscopy N/A 05/31/2015    Procedure: MEDIASTINOSCOPY;  Surgeon: Gaye Pollack, MD;  Location: [facility] OR;  Service: Thoracic;  Laterality: N/A;    REVIEW OF SYSTEMS:  Constitutional: negative Eyes: negative Ears, nose, mouth, throat, and face: negative Respiratory: positive for cough Cardiovascular: negative Gastrointestinal: negative Genitourinary:negative Integument/breast: negative Hematologic/lymphatic: negative Musculoskeletal:positive for bone pain Neurological: negative Behavioral/Psych: negative Endocrine: negative Allergic/Immunologic: negative   PHYSICAL EXAMINATION: General appearance: alert, cooperative, fatigued and no distress Head: Normocephalic, without obvious abnormality, atraumatic Neck: no adenopathy, no JVD, supple, symmetrical, trachea midline and thyroid not enlarged, symmetric, no tenderness/mass/nodules Lymph nodes: Cervical, supraclavicular, and axillary nodes normal. Resp: clear to auscultation bilaterally Back:  symmetric, no curvature. ROM normal. No CVA tenderness. Cardio: regular rate and rhythm, S1, S2 normal, no murmur, click, rub or gallop GI: soft, non-tender; bowel sounds normal; no masses,  no organomegaly Extremities: extremities normal, atraumatic, no cyanosis or edema Neurologic: Alert and oriented X 3, normal strength and tone. Normal symmetric reflexes. Normal coordination and gait  ECOG PERFORMANCE STATUS: 1 - Symptomatic but completely ambulatory  Blood pressure 110/68, pulse 102, temperature 97.9 F (36.6 C), temperature source Oral, resp. rate 18, height '5\' 6"'$  (1.676 m), weight 167 lb 8 oz (75.978 kg), SpO2 100 %.  LABORATORY DATA: Lab Results  Component Value Date   WBC 3.0* 09/05/2015   HGB 13.9 09/05/2015   HCT 42.1 09/05/2015   MCV 90.1 09/05/2015   PLT 227 09/05/2015      Chemistry      Component Value Date/Time   NA 141 09/05/2015 0731   NA 142 05/31/2015 0645   K 3.6 09/05/2015 0731   K 3.7 05/31/2015 0645   CL 110 05/31/2015 0645   CO2 24 09/05/2015 0731   CO2 25 05/31/2015 0645   BUN 19.4 09/05/2015 0731   BUN 8 05/31/2015 0645   CREATININE  1.1 09/05/2015 0731   CREATININE 1.03 05/31/2015 0645      Component Value Date/Time   CALCIUM 9.4 09/05/2015 0731   CALCIUM 9.1 05/31/2015 0645   ALKPHOS 51 09/05/2015 0731   ALKPHOS 42 05/31/2015 0645   AST 12 09/05/2015 0731   AST 15 05/31/2015 0645   ALT 10 09/05/2015 0731   ALT 17 05/31/2015 0645   BILITOT 0.51 09/05/2015 0731   BILITOT 0.4 05/31/2015 0645       RADIOGRAPHIC STUDIES: Ct Chest W Contrast  09/05/2015  CLINICAL DATA:  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 EXAM: CT CHEST WITH CONTRAST TECHNIQUE: Multidetector CT imaging of the chest was performed during intravenous contrast administration. CONTRAST:  57m OMNIPAQUE IOHEXOL 300 MG/ML  SOLN COMPARISON:  05/19/2015 PET-CT. FINDINGS: Mediastinum/Nodes: Normal heart size. No pericardial  fluid/thickening. Great vessels are normal in course and caliber. No central pulmonary emboli. Thyroid appears surgically absent. Normal esophagus. No axillary adenopathy. No residual pathologically enlarged mediastinal or hilar nodes. Lungs/Pleura: No pneumothorax. No pleural effusion. Posterior right upper lobe spiculated 2.1 x 1.3 cm pulmonary nodule (series 5/ image 12) is decreased from 2.8 x 2.0 cm on 05/19/2015. Subsolid 1.0 x 0.6 cm apical left upper lobe pulmonary nodule (series 5/ image 10) previously measured 1.0 x 0.7 cm, not appreciably changed. There are at least 5 additional subcentimeter pulmonary nodules scattered throughout both lungs, largest 6 mm in the right upper lobe (series 5/ image 27), all unchanged. No acute consolidative airspace disease or new significant pulmonary nodules. Upper abdomen: Simple 0.6 cm posterior upper right renal cyst. Stable mild irregular thickening of both adrenal glands without discrete adrenal nodule, suggesting mild adrenal hyperplasia. Musculoskeletal: No aggressive appearing focal osseous lesions. Mild degenerative changes in the thoracic spine. Stable prominently asymmetric retroareolar soft tissue in the left breast, probably asymmetric gynecomastia. IMPRESSION: 1. Partial interval treatment response in the primary right upper lobe neoplasm. 2. No residual thoracic lymphadenopathy. No new or progressive metastatic disease in the chest. 3. Subsolid 1.0 cm apical left upper lobe pulmonary nodule is stable. Additional scattered subcentimeter pulmonary nodules are stable and probably benign. 4. Stable prominently asymmetric retroareolar soft tissue in the left breast, probably symmetric gynecomastia, correlate with clinical findings. Electronically Signed   By: JIlona SorrelM.D.   On: 09/05/2015 09:27    ASSESSMENT AND PLAN: This is a very pleasant 75years old African-American male recently diagnosed with a stage IIIA non-small cell lung cancer, adenocarcinoma  with negative EGFR mutation, negative ALK gene translocation and negative ROS 1. He completed a course of concurrent chemoradiation with weekly carboplatin and paclitaxel status post 5 cycles. The patient tolerated his treatment fairly well. The recent CT scan of the chest showed partial response in the primary left upper lobe neoplasm with no residual thoracic adenopathy. I personally reviewed the scans and discussed with the patient and his wife. I recommended for him to see Dr. BCyndia Bentfor reevaluation and consideration of surgical resection. If the patient is not a surgical candidate, I would consider him for consolidation chemotherapy with 3 cycles of carboplatin for AUC of 5 and paclitaxel 175 MG/M2 every 3 weeks with Neulasta support. I will arrange for the patient a follow-up appointment after his visit with Dr. BCyndia Bent The patient was advised to call immediately if he has any concerning symptoms in the interval. The patient voices understanding of current disease status and treatment options and is in agreement with the current care plan.  All questions were answered. The patient knows to call the clinic with any problems, questions or concerns. We can certainly see the patient much sooner if necessary.  Disclaimer: This note was dictated with voice recognition software. Similar sounding words can inadvertently be transcribed and may not be corrected upon review.

## 2015-09-14 ENCOUNTER — Ambulatory Visit (INDEPENDENT_AMBULATORY_CARE_PROVIDER_SITE_OTHER): Payer: Medicare HMO | Admitting: Surgery

## 2015-09-14 ENCOUNTER — Other Ambulatory Visit: Payer: Self-pay | Admitting: *Deleted

## 2015-09-14 ENCOUNTER — Encounter: Payer: Self-pay | Admitting: Surgery

## 2015-09-14 VITALS — BP 132/87 | HR 100 | Resp 16 | Ht 66.0 in | Wt 167.0 lb

## 2015-09-14 DIAGNOSIS — Z9221 Personal history of antineoplastic chemotherapy: Secondary | ICD-10-CM

## 2015-09-14 DIAGNOSIS — Z5189 Encounter for other specified aftercare: Secondary | ICD-10-CM

## 2015-09-14 DIAGNOSIS — Z923 Personal history of irradiation: Secondary | ICD-10-CM

## 2015-09-14 DIAGNOSIS — C3411 Malignant neoplasm of upper lobe, right bronchus or lung: Secondary | ICD-10-CM | POA: Diagnosis not present

## 2015-09-14 DIAGNOSIS — R918 Other nonspecific abnormal finding of lung field: Secondary | ICD-10-CM

## 2015-09-14 NOTE — Congregational Nurse Program (Signed)
Congregational Nurse Program Note  Date of Encounter: 09/11/2015  Past Medical History: Past Medical History  Diagnosis Date  . Sleep apnea     does not use cpap  . Unspecified essential hypertension   . Hypercholesteremia   . GERD (gastroesophageal reflux disease)   . Diverticulosis of colon (without mention of hemorrhage)   . Irritable bowel syndrome   . Benign neoplasm of colon     colon polyps  . Benign hypertrophy of prostate   . Erectile dysfunction   . Restless legs syndrome (RLS)   . Anxiety state, unspecified   . Onychomycosis   . Constipation, chronic   . Hypothyroidism   . Cancer [facility])     prostate cancer  . Glaucoma   . Radiation 06/20/15-08/02/15    right upper lung region 60 gray    Encounter Details:     CNP Questionnaire - 09/11/15 2309    Patient Demographics   Is this a new or existing patient? Existing   Patient is considered a/an Not Applicable   Race African-American/Black   Patient Assistance   Location of Patient Assistance [facility]   Patient's financial/insurance status Low Income;Medicare   Uninsured Patient No   Patient referred to apply for the following financial assistance Not Applicable   Food insecurities addressed Not Applicable   Transportation assistance No   Assistance securing medications No   Educational health offerings Cancer;Safety;Health literacy   Encounter Details   Primary purpose of visit Education/Health Concerns;Safety;Chronic Illness/Condition Visit;Spiritual Care/Support Visit   Was an Emergency Department visit averted? No   Does patient have a medical provider? Yes   Patient referred to Not Applicable   Was a mental health screening completed? (GAINS tool) No   Does patient have dental issues? No   Does patient have vision issues? Yes   Was a vision referral made? No   Since previous encounter, have you referred patient for abnormal blood pressure that resulted in a new diagnosis or medication change? No    Since previous encounter, have you referred patient for abnormal blood glucose that resulted in a new diagnosis or medication change? No   For Abstraction Use Only   Does patient have insurance? Yes       Client reports that he has had good news this week.  Went to radiation dr and   His tumor has shrunk to 2 inches.  There is no sign of cancer in lymph nodes per client.  He will follow up with oncologist to see what treatment follow up is needed.

## 2015-09-14 NOTE — Progress Notes (Signed)
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 He completed concurrent chemoradiation on 07/18/2015 with a partial response seen on CT. There was a slight decrease in the size of the mass and shrinkage of the mediastinal adenopathy. He says that he tolerated the treatment well with normal appetite, no significant weight loss and no significant adverse effects.   He denies headaches and visual changes. Brain MRI on 06/21/2015 was negative.  Current Outpatient Prescriptions  Medication Sig Dispense Refill  . amLODipine (NORVASC) 5 MG tablet Take 1 tablet (5 mg total) by mouth every morning. 90 tablet 1  . aspirin EC 81 MG tablet Take 81 mg by mouth daily.     . brimonidine-timolol (COMBIGAN) 0.2-0.5 % ophthalmic solution Place 1 drop into both eyes every 12 (twelve) hours.    . chlorproMAZINE (THORAZINE) 25 MG tablet Take 1 tablet (25 mg total) by mouth 3 (three) times daily as needed for hiccoughs. 30 tablet 0  . finasteride ([name]) 5 MG tablet Take 5 mg by mouth daily.    . fluticasone (FLONASE) 50 MCG/ACT nasal spray Place 2 sprays into both nostrils daily. (Patient taking differently: Place 2 sprays into both nostrils daily as needed for allergies or rhinitis. ) 48 g 3  . lactulose (CHRONULAC) 10 GM/15ML solution Take 10 g by mouth 2 (two) times daily as needed (constipation).     [name] levothyroxine (SYNTHROID, LEVOTHROID) 88 MCG tablet TAKE ONE TABLET BY MOUTH ONCE DAILY 45 tablet 1  . Linaclotide (LINZESS) 290 MCG CAPS capsule Take 1 capsule (290 mcg total) by mouth daily. 30 capsule 3  . omeprazole (PRILOSEC) 20 MG capsule TAKE ONE CAPSULE BY MOUTH IN THE MORNING 90 capsule 0  . polyethylene glycol (MIRALAX / GLYCOLAX) packet Take 17 g by mouth daily as needed (constipation). Reported on 06/21/2015    . prochlorperazine (COMPAZINE) 10 MG tablet Take 1 tablet  (10 mg total) by mouth every 6 (six) hours as needed for nausea or vomiting. 30 tablet 0  . simvastatin (ZOCOR) 20 MG tablet TAKE ONE TABLET BY MOUTH AT BEDTIME 90 tablet 0  . sucralfate (CARAFATE) 1 g tablet Take 1 tablet (1 g total) by mouth 4 (four) times daily -  with meals and at bedtime. Mix in 2Tbs water, swish and swallow 60 tablet 0  . tamsulosin (FLOMAX) 0.4 MG CAPS capsule Take 1 capsule (0.4 mg total) by mouth every evening. (Patient taking differently: Take 0.8 mg by mouth at bedtime. ) 90 capsule 3  . sildenafil (VIAGRA) 100 MG tablet Take 1 tablet (100 mg total) by mouth as directed. (Patient not taking: Reported on 09/12/2015) 10 tablet 0  . traMADol (ULTRAM) 50 MG tablet Take 1 tablet (50 mg total) by mouth every 6 (six) hours as needed for moderate pain. (Patient not taking: Reported on 08/01/2015) 20 tablet 0   No current facility-administered medications for this visit.     Physical Exam: BP 132/87 mmHg  Pulse 100  Resp 16  Ht '5\' 6"'$  (1.676 m)  Wt 167 lb (75.751 kg)  BMI 26.97 kg/m2  SpO2 98% He looks well There is no cervical or supraclavicular adenopathy Lungs are clear Cardiac exam shows a regular rate and rhythm with normal heart sounds Abdominal exam is unremarkable  Diagnostic Tests:  CLINICAL DATA: Lung adenocarcinoma of the right upper lobe diagnosed on  05/31/2015, presenting for restaging.  EXAM: CT CHEST WITH CONTRAST  TECHNIQUE: Multidetector CT imaging of the chest was performed during intravenous contrast administration.  CONTRAST: 65m OMNIPAQUE IOHEXOL 300 MG/ML SOLN  COMPARISON: 05/19/2015 PET-CT.  FINDINGS: Mediastinum/Nodes: Normal heart size. No pericardial fluid/thickening. Great vessels are normal in course and caliber. No central pulmonary emboli. Thyroid appears surgically absent. Normal esophagus. No axillary adenopathy. No residual pathologically enlarged mediastinal or hilar nodes.  Lungs/Pleura: No pneumothorax. No  pleural effusion. Posterior right upper lobe spiculated 2.1 x 1.3 cm pulmonary nodule (series 5/ image 12) is decreased from 2.8 x 2.0 cm on 05/19/2015. Subsolid 1.0 x 0.6 cm apical left upper lobe pulmonary nodule (series 5/ image 10) previously measured 1.0 x 0.7 cm, not appreciably changed. There are at least 5 additional subcentimeter pulmonary nodules scattered throughout both lungs, largest 6 mm in the right upper lobe (series 5/ image 27), all unchanged. No acute consolidative airspace disease or new significant pulmonary nodules.  Upper abdomen: Simple 0.6 cm posterior upper right renal cyst. Stable mild irregular thickening of both adrenal glands without discrete adrenal nodule, suggesting mild adrenal hyperplasia.  Musculoskeletal: No aggressive appearing focal osseous lesions. Mild degenerative changes in the thoracic spine. Stable prominently asymmetric retroareolar soft tissue in the left breast, probably asymmetric gynecomastia.  IMPRESSION: 1. Partial interval treatment response in the primary right upper lobe neoplasm. 2. No residual thoracic lymphadenopathy. No new or progressive metastatic disease in the chest. 3. Subsolid 1.0 cm apical left upper lobe pulmonary nodule is stable. Additional scattered subcentimeter pulmonary nodules are stable and probably benign. 4. Stable prominently asymmetric retroareolar soft tissue in the left breast, probably symmetric gynecomastia, correlate with clinical findings.   Electronically Signed  By: JIlona SorrelM.D.  On: 09/05/2015 09:27  Impression:  He has stage 3A adenocarcinoma of the right upper lobe s/p concurrent chemoradiotherapy with partial response. He did not have bulky mediastinal or hilar adenopathy and this appears to have shrunk. His lesion is resectable with a right upper lobectomy. He will need PFT's before deciding if he is a candidate. He is 75years old but in good overall condition and looks  younger than his stated age. While there is no statistically significant survival advantage for lobectomy following concurrent chemoradiation therapy for stage 3A disease there is a trend toward improved survival and there is an improved 5 year progression free survival and improved local control of the primary tumor. I discussed all of this with the patient and his wife. They would like to check his PFT's and think about it further.  Plan:  I will get PFT's and see him back afterward to review things again and make final plans for further treatment.   BGaye Pollack MD [facility] ([phone]

## 2015-09-15 ENCOUNTER — Ambulatory Visit (INDEPENDENT_AMBULATORY_CARE_PROVIDER_SITE_OTHER): Payer: Medicare HMO | Admitting: Internal Medicine

## 2015-09-15 ENCOUNTER — Encounter: Payer: Self-pay | Admitting: Internal Medicine

## 2015-09-15 VITALS — BP 132/80 | HR 106 | Temp 97.6°F | Resp 12 | Wt 168.0 lb

## 2015-09-15 DIAGNOSIS — E0510 Thyrotoxicosis with toxic single thyroid nodule without thyrotoxic crisis or storm: Secondary | ICD-10-CM

## 2015-09-15 DIAGNOSIS — E890 Postprocedural hypothyroidism: Secondary | ICD-10-CM | POA: Diagnosis not present

## 2015-09-15 LAB — T4, FREE: Free T4: 1.03 ng/dL (ref 0.60–1.60)

## 2015-09-15 LAB — TSH: TSH: 6.68 u[IU]/mL — ABNORMAL HIGH (ref 0.35–4.50)

## 2015-09-15 MED ORDER — LEVOTHYROXINE SODIUM 100 MCG PO TABS: 100.0000 ug | ORAL_TABLET | Freq: Every day | ORAL | Status: DC

## 2015-09-15 NOTE — Patient Instructions (Signed)
Continue Levothyroxine 88 mcg daily for now.  Take the thyroid hormone every day, with water, >30 minutes before breakfast, separated by >4 hours from acid reflux medications, calcium, iron, multivitamins.  Please come back for a follow-up appointment in 1 year.

## 2015-09-15 NOTE — Progress Notes (Signed)
Patient ID: Alan [name], male   DOB: 26-Apr-1941, 75 y.o.   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 Last visit 4 mo ago.  Pt was recently dx'ed with metastatic non small cell lung cancer. He had a CXR on 04/21/2015 - RUL nodule. He already had ChTx and RxTx. He may need to have surgery.  Reviewed hx: He was found to have a low TSH (normal fT4) during regular labs.  An uptake and scan (04/20/2014) showed: 1. Warm nodule in the right lobe of thyroid gland. 2.  24 hour I 131 uptake = 22.2% (normal 10-30%)  He had RAI tx 06/01/2014.  He developed hypothyroidism after his radioactive iodine treatment. At last visit we started levothyroxine 25 mg daily >> now on 88 mcg daily (increased 03/04/2015).  He is taking the levothyroxine: - Daily - With water - Separated by more than 30 minutes from breakfast - No calcium, iron - + multivitamins - midday - + PPIs at night  I reviewed pt's thyroid tests: Lab Results  Component Value Date   TSH 2.29 04/21/2015   TSH 14.10* 03/04/2015   TSH 36.89* 12/28/2014   TSH 56.56* 11/15/2014   TSH 9.15* 07/28/2014   TSH 0.04* 05/14/2014   TSH 0.03* 03/19/2014   TSH 0.02* 03/03/2014   TSH 0.93 04/24/2012   TSH 0.34* 04/03/2012   FREET4 0.99 03/04/2015   FREET4 0.56* 12/28/2014   FREET4 0.30* 11/15/2014   FREET4 0.34* 07/28/2014   FREET4 1.21 05/14/2014   FREET4 1.09 03/19/2014   FREET4 0.90 04/24/2012    Pt denies feeling nodules in neck, hoarseness, dysphagia/odynophagia, SOB with lying down; he c/o: - no fatigue - + weight loss and gain - no excessive sweating/heat intolerance - no tremors - no anxiety - no palpitations - no hyperdefecation  He also has prostate cancer - seen by Urology.   He started Compazine, Carafate, Ultram - as needed.   ROS: Constitutional: see HPI Eyes: no  blurry vision, no xerophthalmia ENT: no sore throat, no nodules palpated in throat, no dysphagia/odynophagia, no hoarseness Cardiovascular: no CP/SOB/palpitations/leg swelling Respiratory:+ cough/+ SOB/+ wheezing Gastrointestinal: no N/V/D/C Musculoskeletal: + muscle aches/no joint aches Skin: no rashes Neurological: no tremors/numbness/tingling/dizziness  I reviewed pt's medications, allergies, PMH, social hx, family hx, and changes were documented in the history of present illness. Otherwise, unchanged from my initial visit note.  Past Medical History  Diagnosis Date  . Sleep apnea     does not use cpap  . Unspecified essential hypertension   . Hypercholesteremia   . GERD (gastroesophageal reflux disease)   . Diverticulosis of colon (without mention of hemorrhage)   . Irritable bowel syndrome   . Benign neoplasm of colon     colon polyps  . Benign hypertrophy of prostate   . Erectile dysfunction   . Restless legs syndrome (RLS)   . Anxiety state, unspecified   . Onychomycosis   . Constipation, chronic   . Hypothyroidism   . Cancer [facility])     prostate cancer  . Glaucoma   . Radiation 06/20/15-08/02/15    right upper lung region 60 gray   Past Surgical History  Procedure Laterality Date  . None    . Colonoscopy    . Laparoscopic appendectomy N/A 01/27/2013    Procedure: APPENDECTOMY LAPAROSCOPIC;  Surgeon: Edward Jolly, MD;  Location: [facility];  Service: General;  Laterality: N/A;  . Appendectomy  12/2012  . Thyroidectomy    . Tonsillectomy    . Cardiac catheterization  ? 2008  . Video bronchoscopy N/A 05/31/2015    Procedure: VIDEO BRONCHOSCOPY;  Surgeon: Gaye Pollack, MD;  Location: [facility];  Service: Thoracic;  Laterality: N/A;  . Mediastinoscopy N/A 05/31/2015    Procedure: MEDIASTINOSCOPY;  Surgeon: Gaye Pollack, MD;  Location: [facility];  Service: Thoracic;  Laterality: N/A;   History   Social History  . Marital Status: Married    Spouse Name: N/A     Children: yes   Occupational History  . Retired, Company secretary    Social History Main Topics  . Smoking status: Former Smoker -- 2.00 packs/day for 15 years    Types: Cigarettes    Quit date: 07/02/1968  . Smokeless tobacco: Never Used  . Alcohol Use: No  . Drug Use: No   Current Outpatient Prescriptions on File Prior to Visit  Medication Sig Dispense Refill  . amLODipine (NORVASC) 5 MG tablet Take 1 tablet (5 mg total) by mouth every morning. 90 tablet 1  . aspirin EC 81 MG tablet Take 81 mg by mouth daily.     . brimonidine-timolol (COMBIGAN) 0.2-0.5 % ophthalmic solution Place 1 drop into both eyes every 12 (twelve) hours.    . chlorproMAZINE (THORAZINE) 25 MG tablet Take 1 tablet (25 mg total) by mouth 3 (three) times daily as needed for hiccoughs. 30 tablet 0  . finasteride ([name]) 5 MG tablet Take 5 mg by mouth daily.    . fluticasone (FLONASE) 50 MCG/ACT nasal spray Place 2 sprays into both nostrils daily. (Patient taking differently: Place 2 sprays into both nostrils daily as needed for allergies or rhinitis. ) 48 g 3  . lactulose (CHRONULAC) 10 GM/15ML solution Take 10 g by mouth 2 (two) times daily as needed (constipation).     [name] levothyroxine (SYNTHROID, LEVOTHROID) 88 MCG tablet TAKE ONE TABLET BY MOUTH ONCE DAILY 45 tablet 1  . Linaclotide (LINZESS) 290 MCG CAPS capsule Take 1 capsule (290 mcg total) by mouth daily. 30 capsule 3  . omeprazole (PRILOSEC) 20 MG capsule TAKE ONE CAPSULE BY MOUTH IN THE MORNING 90 capsule 0  . polyethylene glycol (MIRALAX / GLYCOLAX) packet Take 17 g by mouth daily as needed (constipation). Reported on 06/21/2015    . prochlorperazine (COMPAZINE) 10 MG tablet Take 1 tablet (10 mg total) by mouth every 6 (six) hours as needed for nausea or vomiting. 30 tablet 0  . sildenafil (VIAGRA) 100 MG tablet Take 1 tablet (100 mg total) by mouth as directed. (Patient not taking: Reported on 09/12/2015) 10 tablet 0  . simvastatin (ZOCOR) 20 MG tablet TAKE ONE  TABLET BY MOUTH AT BEDTIME 90 tablet 0  . sucralfate (CARAFATE) 1 g tablet Take 1 tablet (1 g total) by mouth 4 (four) times daily -  with meals and at bedtime. Mix in 2Tbs water, swish and swallow 60 tablet 0  . tamsulosin (FLOMAX) 0.4 MG CAPS capsule Take 1 capsule (0.4 mg total) by mouth every evening. (Patient taking differently: Take 0.8 mg by mouth at bedtime. ) 90 capsule 3  . traMADol (ULTRAM) 50 MG tablet Take 1 tablet (50 mg total) by mouth every 6 (six) hours as needed for moderate pain. (Patient not taking: Reported on 08/01/2015) 20 tablet 0   No current facility-administered medications on file prior to visit.   Allergies  Allergen Reactions  . Codeine Other (  See Comments)    : hallucinations,faint,dizziness   Family History  Problem Relation Age of Onset  . Colon cancer Paternal Grandfather 73  . Cancer Paternal Grandfather     prostate  . Cancer Father     prostate  . Cancer Brother     prostate   PE: BP 132/80 mmHg  Pulse 106  Temp(Src) 97.6 F (36.4 C) (Oral)  Resp 12  Wt 168 lb (76.204 kg)  SpO2 97% Body mass index is 27.13 kg/(m^2). Wt Readings from Last 3 Encounters:  09/15/15 168 lb (76.204 kg)  09/14/15 167 lb (75.751 kg)  09/12/15 167 lb 8 oz (75.978 kg)   Constitutional: normal weight, in NAD Eyes: PERRLA, EOMI, no exophthalmos, no lid lag, no stare ENT: moist mucous membranes, no thyromegaly, no cervical lymphadenopathy Cardiovascular: RRR, No MRG Respiratory: CTA B Gastrointestinal: abdomen soft, NT, ND, BS+ Musculoskeletal: no deformities, strength intact in all 4 Skin: moist, warm, no rashes Neurological: no tremor with outstretched hands, DTR normal in all 4  ASSESSMENT: 1. 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 Post-ablative hypothyroidism  PLAN:  1.  and 2. Patient with previous subclinical hyperthyroidism caused by a R toxic adenoma, now s/p ablation, with subsequent hypothyroidism - now on levothyroxine 88 g  daily. He appears euthyroid. Will continue current dose. - Will check TFTs today: TSH, free T4 - reviewed together last TFTs >> normal in 04/2015 - I advised him to take the thyroid hormone every day, with water, >30 minutes before breakfast, separated by >4 hours from acid reflux medications, calcium, iron, multivitamins. He is taking it correctly. - RTC in 1 year  Needs refills when labs are back.  Component     Latest Ref Rng 09/15/2015  TSH     0.35 - 4.50 uIU/mL 6.68 (H)  T4,Free(Direct)     0.60 - 1.60 ng/dL 1.03   We'll increase the dose of levothyroxine from 88 to 100 g and recheck his TFTs in 1.5 months.

## 2015-09-16 ENCOUNTER — Ambulatory Visit (HOSPITAL_COMMUNITY)
Admission: RE | Admit: 2015-09-16 | Discharge: 2015-09-16 | Disposition: A | Discharge status: Home / Self Care | Payer: Medicare HMO | Source: Ambulatory Visit | Attending: Surgery | Admitting: Surgery

## 2015-09-16 DIAGNOSIS — R918 Other nonspecific abnormal finding of lung field: Secondary | ICD-10-CM | POA: Insufficient documentation

## 2015-09-16 LAB — PULMONARY FUNCTION TEST
DL/VA % pred: 65 %
DL/VA: 2.85 ml/min/mmHg/L
DLCO UNC % PRED: 55 %
DLCO UNC: 15.04 ml/min/mmHg
DLCO cor % pred: 56 %
DLCO cor: 15.35 ml/min/mmHg
FEF 25-75 PRE: 2.79 L/s
FEF 25-75 Post: 3.32 L/sec
FEF2575-%Change-Post: 18 %
FEF2575-%Pred-Post: 174 %
FEF2575-%Pred-Pre: 147 %
FEV1-%Change-Post: 4 %
FEV1-%PRED-POST: 137 %
FEV1-%PRED-PRE: 131 %
FEV1-POST: 3.18 L
FEV1-PRE: 3.03 L
FEV1FVC-%Change-Post: 6 %
FEV1FVC-%Pred-Pre: 98 %
FEV6-%CHANGE-POST: -1 %
FEV6-%PRED-POST: 129 %
FEV6-%PRED-PRE: 131 %
FEV6-PRE: 3.9 L
FEV6-Post: 3.85 L
FEV6FVC-%CHANGE-POST: -2 %
FEV6FVC-%PRED-PRE: 105 %
FEV6FVC-%Pred-Post: 102 %
FVC-%Change-Post: -1 %
FVC-%Pred-Post: 126 %
FVC-%Pred-Pre: 127 %
FVC-Post: 3.98 L
FVC-Pre: 4.03 L
POST FEV1/FVC RATIO: 80 %
POST FEV6/FVC RATIO: 97 %
PRE FEV1/FVC RATIO: 75 %
Pre FEV6/FVC Ratio: 100 %
RV % PRED: 76 %
RV: 1.79 L
TLC % PRED: 95 %
TLC: 5.95 L

## 2015-09-16 MED ORDER — ALBUTEROL SULFATE (2.5 MG/3ML) 0.083% IN NEBU
2.5000 mg | INHALATION_SOLUTION | Freq: Once | RESPIRATORY_TRACT | Status: AC
  Administered 2015-09-16: 2.5 mg via RESPIRATORY_TRACT

## 2015-09-21 ENCOUNTER — Ambulatory Visit (INDEPENDENT_AMBULATORY_CARE_PROVIDER_SITE_OTHER): Payer: Medicare HMO | Admitting: Surgery

## 2015-09-21 ENCOUNTER — Encounter: Payer: Self-pay | Admitting: Surgery

## 2015-09-21 VITALS — BP 110/74 | HR 104 | Resp 20 | Ht 66.0 in | Wt 168.0 lb

## 2015-09-21 DIAGNOSIS — Z923 Personal history of irradiation: Secondary | ICD-10-CM

## 2015-09-21 DIAGNOSIS — R599 Enlarged lymph nodes, unspecified: Secondary | ICD-10-CM

## 2015-09-21 DIAGNOSIS — R911 Solitary pulmonary nodule: Secondary | ICD-10-CM

## 2015-09-21 DIAGNOSIS — R591 Generalized enlarged lymph nodes: Secondary | ICD-10-CM | POA: Diagnosis not present

## 2015-09-21 DIAGNOSIS — C3411 Malignant neoplasm of upper lobe, right bronchus or lung: Secondary | ICD-10-CM | POA: Diagnosis not present

## 2015-09-21 DIAGNOSIS — Z5189 Encounter for other specified aftercare: Secondary | ICD-10-CM

## 2015-09-21 DIAGNOSIS — Z9221 Personal history of antineoplastic chemotherapy: Secondary | ICD-10-CM

## 2015-09-26 ENCOUNTER — Encounter: Payer: Self-pay | Admitting: Surgery

## 2015-09-26 NOTE — Progress Notes (Signed)
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 His PFT's show normal spirometry but a moderate reduction in diffusion capacity to 55% predicted. With normal spirometry this is most likely due to a pulmonary vascular process. He continues to feel well.  Current Outpatient Prescriptions  Medication Sig Dispense Refill  . amLODipine (NORVASC) 5 MG tablet Take 1 tablet (5 mg total) by mouth every morning. 90 tablet 1  . aspirin EC 81 MG tablet Take 81 mg by mouth daily.     . brimonidine-timolol (COMBIGAN) 0.2-0.5 % ophthalmic solution Place 1 drop into both eyes every 12 (twelve) hours.    . chlorproMAZINE (THORAZINE) 25 MG tablet Take 1 tablet (25 mg total) by mouth 3 (three) times daily as needed for hiccoughs. 30 tablet 0  . finasteride ([name]) 5 MG tablet Take 5 mg by mouth daily.    . fluticasone (FLONASE) 50 MCG/ACT nasal spray Place 2 sprays into both nostrils daily. (Patient taking differently: Place 2 sprays into both nostrils daily as needed for allergies or rhinitis. ) 48 g 3  . lactulose (CHRONULAC) 10 GM/15ML solution Take 10 g by mouth 2 (two) times daily as needed (constipation).     [name] levothyroxine (SYNTHROID, LEVOTHROID) 100 MCG tablet Take 1 tablet (100 mcg total) by mouth daily. 45 tablet 1  . Linaclotide (LINZESS) 290 MCG CAPS capsule Take 1 capsule (290 mcg total) by mouth daily. 30 capsule 3  . omeprazole (PRILOSEC) 20 MG capsule TAKE ONE CAPSULE BY MOUTH IN THE MORNING 90 capsule 0  . polyethylene glycol (MIRALAX / GLYCOLAX) packet Take 17 g by mouth daily as needed (constipation). Reported on 06/21/2015    . prochlorperazine (COMPAZINE) 10 MG tablet Take 1 tablet (10 mg total) by mouth every 6 (six) hours as needed for nausea or vomiting. 30 tablet 0  .  sildenafil (VIAGRA) 100 MG tablet Take 1 tablet (100 mg total) by mouth as directed. 10 tablet 0  . simvastatin (ZOCOR) 20 MG tablet TAKE ONE TABLET BY MOUTH AT BEDTIME 90 tablet 0  . sucralfate (CARAFATE) 1 g tablet Take 1 tablet (1 g total) by mouth 4 (four) times daily -  with meals and at bedtime. Mix in 2Tbs water, swish and swallow 60 tablet 0  . tamsulosin (FLOMAX) 0.4 MG CAPS capsule Take 1 capsule (0.4 mg total) by mouth every evening. (Patient taking differently: Take 0.8 mg by mouth at bedtime. ) 90 capsule 3  . traMADol (ULTRAM) 50 MG tablet Take 1 tablet (50 mg total) by mouth every 6 (six) hours as needed for moderate pain. 20 tablet 0   No current facility-administered medications for this visit.     Physical Exam: BP 110/74 mmHg  Pulse 104  Resp 20  Ht '5\' 6"'$  (1.676 m)  Wt 168 lb (76.204 kg)  BMI 27.13 kg/m2  SpO2  He looks well There is no cervical or supraclavicular adenopathy Lungs are clear Cardiac exam shows a regular rate and rhythm with normal heart sounds Abdominal exam is unremarkable  Diagnostic Tests:    Ref Range 10d ago    FVC-Pre L 4.03P   FVC-%Pred-Pre % 127P   FVC-Post L 3.98P   FVC-%Pred-Post % 126P   FVC-%Change-Post % -1P   FEV1-Pre L 3.03P  FEV1-%Pred-Pre % 131P   FEV1-Post L 3.18P   FEV1-%Pred-Post % 137P   FEV1-%Change-Post % 4P   FEV6-Pre L 3.90P   FEV6-%Pred-Pre % 131P   FEV6-Post L 3.85P   FEV6-%Pred-Post % 129P   FEV6-%Change-Post % -1P   Pre FEV1/FVC ratio % 75P   FEV1FVC-%Pred-Pre % 98P   Post FEV1/FVC ratio % 80P   FEV1FVC-%Change-Post % 6P   Pre FEV6/FVC Ratio % 100P   FEV6FVC-%Pred-Pre % 105P   Post FEV6/FVC ratio % 97P   FEV6FVC-%Pred-Post % 102P   FEV6FVC-%Change-Post % -2P   FEF 25-75 Pre L/sec 2.79P   FEF2575-%Pred-Pre % 147P   FEF 25-75 Post L/sec 3.32P   FEF2575-%Pred-Post % 174P   FEF2575-%Change-Post % 18P   RV L 1.79P   RV % pred % 76P   TLC L 5.95P   TLC % pred % 95P    DLCO unc ml/min/mmHg 15.04P   DLCO unc % pred % 55P   DLCO cor ml/min/mmHg 15.35P   DLCO cor % pred % 56P   DL/VA ml/min/mmHg/L 2.85P   DL/VA % pred % 65P   Resulting Agency [facility]       Specimen Collected: 09/16/15 10:58 AM Last Resulted: 09/16/15 11:46 AM          Impression:  He has adequate pulmonary function to tolerate a right upper lobectomy although with a moderate reduction of diffusion capacity to less than 50% of predicted he may have some postop hypoxemia. I discussed the option of surgical resection again with the patient including the potential benefit of improved local control of his tumor and improved 5 year progression free survival. I discussed the risks of surgery given his age and that there is no statistically significant survival advantage to surgery for his stage disease. He is very apprehensive about surgery and feels like non-surgical treatment is the best option for him. He and his wife have given it a lot of thought and are confident with their decision.   Plan:  He will return to see Dr. [name] to discuss consolidation chemotherapy.   Gaye Pollack, MD [facility] [phone]

## 2015-09-27 ENCOUNTER — Telehealth: Payer: Self-pay | Admitting: *Deleted

## 2015-09-27 DIAGNOSIS — C3411 Malignant neoplasm of upper lobe, right bronchus or lung: Secondary | ICD-10-CM

## 2015-09-27 NOTE — Telephone Encounter (Signed)
Oncology Nurse Navigator Documentation  Oncology Nurse Navigator Flowsheets 09/27/2015  Navigator Encounter Type Telephone  Telephone Outgoing Call  Barriers/Navigation Needs Coordination of Care  Interventions Coordination of Care  Coordination of Care Appts  Acuity Level 2  Acuity Level 2 Assistance expediting appointments  Time Spent with Patient 15   I followed up with Dr. Cyndia Bent regarding patient.  He updated me that patient is not interested in surgery.  I updated Dr. [name].  He asked that I schedule patient.  I called patient and scheduled.  Patient will be seen on 09/29/15 arrive at 1:30.  Patient verbalized understanding of appt time and place.

## 2015-09-28 DIAGNOSIS — Z719 Counseling, unspecified: Secondary | ICD-10-CM

## 2015-09-29 ENCOUNTER — Encounter: Payer: Self-pay | Admitting: Internal Medicine

## 2015-09-29 ENCOUNTER — Other Ambulatory Visit (HOSPITAL_BASED_OUTPATIENT_CLINIC_OR_DEPARTMENT_OTHER): Payer: Medicare HMO

## 2015-09-29 ENCOUNTER — Ambulatory Visit (HOSPITAL_BASED_OUTPATIENT_CLINIC_OR_DEPARTMENT_OTHER): Payer: Medicare HMO

## 2015-09-29 ENCOUNTER — Ambulatory Visit (HOSPITAL_BASED_OUTPATIENT_CLINIC_OR_DEPARTMENT_OTHER): Payer: Medicare HMO | Admitting: Internal Medicine

## 2015-09-29 ENCOUNTER — Telehealth: Payer: Self-pay | Admitting: Internal Medicine

## 2015-09-29 ENCOUNTER — Other Ambulatory Visit: Payer: Self-pay | Admitting: Medical Oncology

## 2015-09-29 VITALS — BP 119/98 | HR 105 | Temp 97.7°F | Resp 16 | Ht 66.0 in | Wt 168.7 lb

## 2015-09-29 DIAGNOSIS — C3411 Malignant neoplasm of upper lobe, right bronchus or lung: Secondary | ICD-10-CM

## 2015-09-29 DIAGNOSIS — Z5111 Encounter for antineoplastic chemotherapy: Secondary | ICD-10-CM

## 2015-09-29 HISTORY — DX: Encounter for antineoplastic chemotherapy: Z51.11

## 2015-09-29 LAB — COMPREHENSIVE METABOLIC PANEL
ALBUMIN: 3.4 g/dL — AB (ref 3.5–5.0)
ALK PHOS: 53 U/L (ref 40–150)
ALT: 9 U/L (ref 0–55)
ANION GAP: 7 meq/L (ref 3–11)
AST: 11 U/L (ref 5–34)
BILIRUBIN TOTAL: 0.47 mg/dL (ref 0.20–1.20)
BUN: 15.2 mg/dL (ref 7.0–26.0)
CALCIUM: 9.5 mg/dL (ref 8.4–10.4)
CO2: 27 mEq/L (ref 22–29)
Chloride: 106 mEq/L (ref 98–109)
Creatinine: 1.2 mg/dL (ref 0.7–1.3)
EGFR: 67 mL/min/{1.73_m2} — AB (ref >90)
GLUCOSE: 91 mg/dL (ref 70–140)
POTASSIUM: 4 meq/L (ref 3.5–5.1)
Sodium: 140 mEq/L (ref 136–145)
TOTAL PROTEIN: 6.7 g/dL (ref 6.4–8.3)

## 2015-09-29 LAB — CBC WITH DIFFERENTIAL/PLATELET
BASO%: 0.4 % (ref 0.0–2.0)
BASOS ABS: 0 10*3/uL (ref 0.0–0.1)
EOS ABS: 0 10*3/uL (ref 0.0–0.5)
EOS%: 0.7 % (ref 0.0–7.0)
HEMATOCRIT: 39.7 % (ref 38.4–49.9)
HEMOGLOBIN: 13 g/dL (ref 13.0–17.1)
LYMPH%: 26.3 % (ref 14.0–49.0)
MCH: 29.9 pg (ref 27.2–33.4)
MCHC: 32.9 g/dL (ref 32.0–36.0)
MCV: 91 fL (ref 79.3–98.0)
MONO#: 0.5 10*3/uL (ref 0.1–0.9)
MONO%: 14 % (ref 0.0–14.0)
NEUT%: 58.6 % (ref 39.0–75.0)
NEUTROS ABS: 2.1 10*3/uL (ref 1.5–6.5)
PLATELETS: 198 10*3/uL (ref 140–400)
RBC: 4.36 10*6/uL (ref 4.20–5.82)
RDW: 16.3 % — AB (ref 11.0–14.6)
WBC: 3.6 10*3/uL — AB (ref 4.0–10.3)
lymph#: 0.9 10*3/uL (ref 0.9–3.3)

## 2015-09-29 MED ORDER — CYANOCOBALAMIN 1000 MCG/ML IJ SOLN
1000.0000 ug | Freq: Once | INTRAMUSCULAR | Status: AC
  Administered 2015-09-29: 1000 ug via INTRAMUSCULAR

## 2015-09-29 MED ORDER — FOLIC ACID 1 MG PO TABS: 1.0000 mg | ORAL_TABLET | Freq: Every day | ORAL | Status: DC

## 2015-09-29 MED ORDER — DEXAMETHASONE 4 MG PO TABS: ORAL_TABLET | ORAL | Status: DC

## 2015-09-29 NOTE — Telephone Encounter (Signed)
lvm for pt regarding to April appt.... °

## 2015-09-29 NOTE — Progress Notes (Signed)
[place] [phone]   [phone]  OFFICE PROGRESS NOTE  [name], MD [place] [address]  DIAGNOSIS: Stage IIIA (T1b, N2, M0) non-small cell lung cancer, adenocarcinoma presented with right right upper lobe nodule in addition to mediastinal lymphadenopathy diagnosed in November 2016.  PRIOR THERAPY: Concurrent chemoradiation with weekly carboplatin for AUC of 2 and paclitaxel 45 MG/M2. Status post 5 cycles. Last dose was given 07/18/2015 with partial response. [name]  CURRENT THERAPY: Consolidation chemotherapy with carboplatin for AUC of 5 and Alimta 500 MG/M2 every C weeks. First dose 10/06/2015.  INTERVAL HISTORY: Alan [name] 75 y.o. male returns to the clinic today for follow-up visit accompanied by his wife. The patient completed a course of concurrent chemoradiation with weekly carboplatin and paclitaxel status post 5 cycles. His restaging scan showed partial response. The patient was referred to Dr. Cyndia Bent for consideration of surgical resection. After discussion of the risks and benefits of surgery with Dr. Cyndia Bent, the patient became very reluctant to proceed with surgery and he came back for evaluation and discussion of consolidation chemotherapy. He is feeling fine today with no specific complaints. He denied having any significant nausea or vomiting. He has no fever or chills. He denied having any significant chest pain, shortness of breath, cough or hemoptysis. He has no significant weight loss or night sweats. He had some back pain recently and was seen by his urologist and is started on treatment with Mobic and Levaquin for questionable urinary tract infection. He is feeling much better after starting the treatment.  MEDICAL HISTORY: Past Medical History  Diagnosis Date  . Sleep apnea     does not use cpap  . Unspecified essential hypertension   . Hypercholesteremia   . GERD (gastroesophageal reflux disease)   .  Diverticulosis of colon (without mention of hemorrhage)   . Irritable bowel syndrome   . Benign neoplasm of colon     colon polyps  . Benign hypertrophy of prostate   . Erectile dysfunction   . Restless legs syndrome (RLS)   . Anxiety state, unspecified   . Onychomycosis   . Constipation, chronic   . Hypothyroidism   . Cancer [facility])     prostate cancer  . Glaucoma   . Radiation 06/20/15-08/02/15    right upper lung region 60 gray    ALLERGIES:  is allergic to codeine.  MEDICATIONS:  Current Outpatient Prescriptions  Medication Sig Dispense Refill  . amLODipine (NORVASC) 5 MG tablet Take 1 tablet (5 mg total) by mouth every morning. 90 tablet 1  . aspirin EC 81 MG tablet Take 81 mg by mouth daily.     . brimonidine-timolol (COMBIGAN) 0.2-0.5 % ophthalmic solution Place 1 drop into both eyes every 12 (twelve) hours.    . chlorproMAZINE (THORAZINE) 25 MG tablet Take 1 tablet (25 mg total) by mouth 3 (three) times daily as needed for hiccoughs. 30 tablet 0  . finasteride ([name]) 5 MG tablet Take 5 mg by mouth daily.    . fluticasone (FLONASE) 50 MCG/ACT nasal spray Place 2 sprays into both nostrils daily. (Patient taking differently: Place 2 sprays into both nostrils daily as needed for allergies or rhinitis. ) 48 g 3  . lactulose (CHRONULAC) 10 GM/15ML solution Take 10 g by mouth 2 (two) times daily as needed (constipation).     [name] levofloxacin (LEVAQUIN) 500 MG tablet Take 1 tablet by mouth daily.    [name] levothyroxine (SYNTHROID,  LEVOTHROID) 100 MCG tablet Take 1 tablet (100 mcg total) by mouth daily. 45 tablet 1  . Linaclotide (LINZESS) 290 MCG CAPS capsule Take 1 capsule (290 mcg total) by mouth daily. 30 capsule 3  . meloxicam (MOBIC) 7.5 MG tablet Take 1 tablet by mouth daily.    [name] omeprazole (PRILOSEC) 20 MG capsule TAKE ONE CAPSULE BY MOUTH IN THE MORNING 90 capsule 0  . polyethylene glycol (MIRALAX / GLYCOLAX) packet Take 17 g by mouth daily as needed (constipation). Reported on  06/21/2015    . sildenafil (VIAGRA) 100 MG tablet Take 1 tablet (100 mg total) by mouth as directed. 10 tablet 0  . simvastatin (ZOCOR) 20 MG tablet TAKE ONE TABLET BY MOUTH AT BEDTIME 90 tablet 0  . sucralfate (CARAFATE) 1 g tablet Take 1 tablet (1 g total) by mouth 4 (four) times daily -  with meals and at bedtime. Mix in 2Tbs water, swish and swallow 60 tablet 0  . tamsulosin (FLOMAX) 0.4 MG CAPS capsule Take 1 capsule (0.4 mg total) by mouth every evening. (Patient taking differently: Take 0.8 mg by mouth at bedtime. ) 90 capsule 3  . traMADol (ULTRAM) 50 MG tablet Take 1 tablet (50 mg total) by mouth every 6 (six) hours as needed for moderate pain. 20 tablet 0  . prochlorperazine (COMPAZINE) 10 MG tablet Take 1 tablet (10 mg total) by mouth every 6 (six) hours as needed for nausea or vomiting. (Patient not taking: Reported on 09/29/2015) 30 tablet 0   No current facility-administered medications for this visit.    SURGICAL HISTORY:  Past Surgical History  Procedure Laterality Date  . None    . Colonoscopy    . Laparoscopic appendectomy N/A 01/27/2013    Procedure: APPENDECTOMY LAPAROSCOPIC;  Surgeon: Edward Jolly, MD;  Location: [facility] ORS;  Service: General;  Laterality: N/A;  . Appendectomy  12/2012  . Thyroidectomy    . Tonsillectomy    . Cardiac catheterization  ? 2008  . Video bronchoscopy N/A 05/31/2015    Procedure: VIDEO BRONCHOSCOPY;  Surgeon: Gaye Pollack, MD;  Location: [facility] OR;  Service: Thoracic;  Laterality: N/A;  . Mediastinoscopy N/A 05/31/2015    Procedure: MEDIASTINOSCOPY;  Surgeon: Gaye Pollack, MD;  Location: [facility] OR;  Service: Thoracic;  Laterality: N/A;    REVIEW OF SYSTEMS:  Constitutional: negative Eyes: negative Ears, nose, mouth, throat, and face: negative Respiratory: positive for cough Cardiovascular: negative Gastrointestinal: negative Genitourinary:negative Integument/breast: negative Hematologic/lymphatic: negative Musculoskeletal:positive for  bone pain Neurological: negative Behavioral/Psych: negative Endocrine: negative Allergic/Immunologic: negative   PHYSICAL EXAMINATION: General appearance: alert, cooperative, fatigued and no distress Head: Normocephalic, without obvious abnormality, atraumatic Neck: no adenopathy, no JVD, supple, symmetrical, trachea midline and thyroid not enlarged, symmetric, no tenderness/mass/nodules Lymph nodes: Cervical, supraclavicular, and axillary nodes normal. Resp: clear to auscultation bilaterally Back: symmetric, no curvature. ROM normal. No CVA tenderness. Cardio: regular rate and rhythm, S1, S2 normal, no murmur, click, rub or gallop GI: soft, non-tender; bowel sounds normal; no masses,  no organomegaly Extremities: extremities normal, atraumatic, no cyanosis or edema Neurologic: Alert and oriented X 3, normal strength and tone. Normal symmetric reflexes. Normal coordination and gait  ECOG PERFORMANCE STATUS: 1 - Symptomatic but completely ambulatory  Blood pressure 119/98, pulse 105, temperature 97.7 F (36.5 C), temperature source Oral, resp. rate 16, height '5\' 6"'  (1.676 m), weight 168 lb 11.2 oz (76.522 kg), SpO2 100 %.  LABORATORY DATA: Lab Results  Component Value Date   WBC 3.6* 09/29/2015  HGB 13.0 09/29/2015   HCT 39.7 09/29/2015   MCV 91.0 09/29/2015   PLT 198 09/29/2015      Chemistry      Component Value Date/Time   NA 140 09/29/2015 1342   NA 142 05/31/2015 0645   K 4.0 09/29/2015 1342   K 3.7 05/31/2015 0645   CL 110 05/31/2015 0645   CO2 27 09/29/2015 1342   CO2 25 05/31/2015 0645   BUN 15.2 09/29/2015 1342   BUN 8 05/31/2015 0645   CREATININE 1.2 09/29/2015 1342   CREATININE 1.03 05/31/2015 0645      Component Value Date/Time   CALCIUM 9.5 09/29/2015 1342   CALCIUM 9.1 05/31/2015 0645   ALKPHOS 53 09/29/2015 1342   ALKPHOS 42 05/31/2015 0645   AST 11 09/29/2015 1342   AST 15 05/31/2015 0645   ALT 9 09/29/2015 1342   ALT 17 05/31/2015 0645    BILITOT 0.47 09/29/2015 1342   BILITOT 0.4 05/31/2015 0645       RADIOGRAPHIC STUDIES: Ct Chest W Contrast  09/05/2015  CLINICAL DATA:  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 EXAM: CT CHEST WITH CONTRAST TECHNIQUE: Multidetector CT imaging of the chest was performed during intravenous contrast administration. CONTRAST:  19m OMNIPAQUE IOHEXOL 300 MG/ML  SOLN COMPARISON:  05/19/2015 PET-CT. FINDINGS: Mediastinum/Nodes: Normal heart size. No pericardial fluid/thickening. Great vessels are normal in course and caliber. No central pulmonary emboli. Thyroid appears surgically absent. Normal esophagus. No axillary adenopathy. No residual pathologically enlarged mediastinal or hilar nodes. Lungs/Pleura: No pneumothorax. No pleural effusion. Posterior right upper lobe spiculated 2.1 x 1.3 cm pulmonary nodule (series 5/ image 12) is decreased from 2.8 x 2.0 cm on 05/19/2015. Subsolid 1.0 x 0.6 cm apical left upper lobe pulmonary nodule (series 5/ image 10) previously measured 1.0 x 0.7 cm, not appreciably changed. There are at least 5 additional subcentimeter pulmonary nodules scattered throughout both lungs, largest 6 mm in the right upper lobe (series 5/ image 27), all unchanged. No acute consolidative airspace disease or new significant pulmonary nodules. Upper abdomen: Simple 0.6 cm posterior upper right renal cyst. Stable mild irregular thickening of both adrenal glands without discrete adrenal nodule, suggesting mild adrenal hyperplasia. Musculoskeletal: No aggressive appearing focal osseous lesions. Mild degenerative changes in the thoracic spine. Stable prominently asymmetric retroareolar soft tissue in the left breast, probably asymmetric gynecomastia. IMPRESSION: 1. Partial interval treatment response in the primary right upper lobe neoplasm. 2. No residual thoracic lymphadenopathy. No new or progressive metastatic disease in the chest. 3. Subsolid 1.0  cm apical left upper lobe pulmonary nodule is stable. Additional scattered subcentimeter pulmonary nodules are stable and probably benign. 4. Stable prominently asymmetric retroareolar soft tissue in the left breast, probably symmetric gynecomastia, correlate with clinical findings. Electronically Signed   By: JIlona SorrelM.D.   On: 09/05/2015 09:27    ASSESSMENT AND PLAN: This is a very pleasant 75years old African-American male recently diagnosed with a stage IIIA non-small cell lung cancer, adenocarcinoma with negative EGFR mutation, negative ALK gene translocation and negative ROS 1. He completed a course of concurrent chemoradiation with weekly carboplatin and paclitaxel status post 5 cycles. The patient tolerated his treatment fairly well. The recent CT scan of the chest showed partial response in the primary left upper lobe neoplasm with no residual thoracic adenopathy. He was seen by Dr. BCyndia Bentfor consideration of surgical resection but the patient became apprehensive about surgery after discussion of the risk and  benefits of the procedure. He is interested in proceeding with consolidation chemotherapy. I gave the patient 2 options for treatment of his condition including 3 cycles of chemotherapy with carboplatin and paclitaxel followed by Neulasta versus treatment with carboplatin and Alimta. I discussed with the patient adverse effect of both regimens and he is interested in proceeding with treatment was carboplatin and Alimta. He will be treated with carboplatin for AUC of 5 and Alimta 500 MG/M2 every 3 weeks. I discussed with the patient adverse effects of this treatment including but not limited to alopecia, myelosuppression, nausea and vomiting, peripheral neuropathy, liver or renal dysfunction. We will arrange for the patient to receive vitamin B 12 injection today. The patient would also receive prescription for Decadron 4 mg by mouth twice a day, the day before, day of and day after the  chemotherapy in addition to folic acid 1 mg by mouth daily. He is expected to start the first cycle of this treatment on 10/06/2015. He would come back for follow-up visit in 4 weeks with the start of cycle #2. The patient was advised to call immediately if he has any concerning symptoms in the interval. The patient voices understanding of current disease status and treatment options and is in agreement with the current care plan.  All questions were answered. The patient knows to call the clinic with any problems, questions or concerns. We can certainly see the patient much sooner if necessary.  Disclaimer: This note was dictated with voice recognition software. Similar sounding words can inadvertently be transcribed and may not be corrected upon review.

## 2015-09-29 NOTE — Patient Instructions (Signed)

## 2015-10-02 DIAGNOSIS — Z719 Counseling, unspecified: Secondary | ICD-10-CM

## 2015-10-03 ENCOUNTER — Telehealth: Payer: Self-pay | Admitting: *Deleted

## 2015-10-03 NOTE — Telephone Encounter (Signed)
Oncology Nurse Navigator Documentation  Oncology Nurse Navigator Flowsheets 10/03/2015  Navigator Encounter Type Telephone  Telephone Incoming Call  Treatment Phase Pre-Tx/Tx Discussion  Barriers/Navigation Needs Education  Education Other  Acuity Level 1  Acuity Level 2 Other  Time Spent with Patient 15   Patient called.  He was questioning his appt's.  I clarified.

## 2015-10-06 ENCOUNTER — Ambulatory Visit (HOSPITAL_BASED_OUTPATIENT_CLINIC_OR_DEPARTMENT_OTHER): Payer: Medicare HMO

## 2015-10-06 ENCOUNTER — Other Ambulatory Visit (HOSPITAL_BASED_OUTPATIENT_CLINIC_OR_DEPARTMENT_OTHER): Payer: Medicare HMO

## 2015-10-06 ENCOUNTER — Encounter: Payer: Self-pay | Admitting: Internal Medicine

## 2015-10-06 VITALS — BP 139/75 | HR 109 | Temp 97.1°F

## 2015-10-06 DIAGNOSIS — C3411 Malignant neoplasm of upper lobe, right bronchus or lung: Secondary | ICD-10-CM | POA: Diagnosis not present

## 2015-10-06 DIAGNOSIS — Z5111 Encounter for antineoplastic chemotherapy: Secondary | ICD-10-CM

## 2015-10-06 LAB — CBC WITH DIFFERENTIAL/PLATELET
BASO%: 0.3 % (ref 0.0–2.0)
Basophils Absolute: 0 10*3/uL (ref 0.0–0.1)
EOS%: 0 % (ref 0.0–7.0)
Eosinophils Absolute: 0 10*3/uL (ref 0.0–0.5)
HCT: 42.8 % (ref 38.4–49.9)
HGB: 14.1 g/dL (ref 13.0–17.1)
LYMPH%: 13.9 % — AB (ref 14.0–49.0)
MCH: 30 pg (ref 27.2–33.4)
MCHC: 33 g/dL (ref 32.0–36.0)
MCV: 91 fL (ref 79.3–98.0)
MONO#: 0.2 10*3/uL (ref 0.1–0.9)
MONO%: 3.9 % (ref 0.0–14.0)
NEUT#: 4.2 10*3/uL (ref 1.5–6.5)
NEUT%: 81.9 % — AB (ref 39.0–75.0)
PLATELETS: 243 10*3/uL (ref 140–400)
RBC: 4.7 10*6/uL (ref 4.20–5.82)
RDW: 15.1 % — ABNORMAL HIGH (ref 11.0–14.6)
WBC: 5.1 10*3/uL (ref 4.0–10.3)
lymph#: 0.7 10*3/uL — ABNORMAL LOW (ref 0.9–3.3)

## 2015-10-06 LAB — COMPREHENSIVE METABOLIC PANEL
ALK PHOS: 51 U/L (ref 40–150)
ALT: 12 U/L (ref 0–55)
ANION GAP: 8 meq/L (ref 3–11)
AST: 13 U/L (ref 5–34)
Albumin: 3.6 g/dL (ref 3.5–5.0)
BUN: 16.7 mg/dL (ref 7.0–26.0)
CHLORIDE: 107 meq/L (ref 98–109)
CO2: 24 meq/L (ref 22–29)
Calcium: 10.1 mg/dL (ref 8.4–10.4)
Creatinine: 1.2 mg/dL (ref 0.7–1.3)
EGFR: 67 mL/min/{1.73_m2} — ABNORMAL LOW (ref >90)
GLUCOSE: 198 mg/dL — AB (ref 70–140)
Potassium: 3.7 mEq/L (ref 3.5–5.1)
SODIUM: 139 meq/L (ref 136–145)
Total Bilirubin: 0.42 mg/dL (ref 0.20–1.20)
Total Protein: 7.2 g/dL (ref 6.4–8.3)

## 2015-10-06 MED ORDER — PALONOSETRON HCL INJECTION 0.25 MG/5ML
0.2500 mg | Freq: Once | INTRAVENOUS | Status: AC
  Administered 2015-10-06: 0.25 mg via INTRAVENOUS

## 2015-10-06 MED ORDER — SODIUM CHLORIDE 0.9 % IV SOLN
Freq: Once | INTRAVENOUS | Status: AC
  Administered 2015-10-06: 09:00:00 via INTRAVENOUS

## 2015-10-06 MED ORDER — SODIUM CHLORIDE 0.9 % IV SOLN
500.0000 mg/m2 | Freq: Once | INTRAVENOUS | Status: AC
  Administered 2015-10-06: 950 mg via INTRAVENOUS
  Filled 2015-10-06: qty 38

## 2015-10-06 MED ORDER — DEXAMETHASONE SODIUM PHOSPHATE 100 MG/10ML IJ SOLN
10.0000 mg | Freq: Once | INTRAMUSCULAR | Status: AC
  Administered 2015-10-06: 10 mg via INTRAVENOUS
  Filled 2015-10-06: qty 1

## 2015-10-06 MED ORDER — PALONOSETRON HCL INJECTION 0.25 MG/5ML
INTRAVENOUS | Status: AC
  Filled 2015-10-06: qty 5

## 2015-10-06 MED ORDER — SODIUM CHLORIDE 0.9 % IV SOLN
413.0000 mg | Freq: Once | INTRAVENOUS | Status: AC
  Administered 2015-10-06: 410 mg via INTRAVENOUS
  Filled 2015-10-06: qty 41

## 2015-10-06 NOTE — Congregational Nurse Program (Signed)
Congregational Nurse Program Note  Date of Encounter: 10/02/2015  Past Medical History: Past Medical History  Diagnosis Date  . Sleep apnea     does not use cpap  . Unspecified essential hypertension   . Hypercholesteremia   . GERD (gastroesophageal reflux disease)   . Diverticulosis of colon (without mention of hemorrhage)   . Irritable bowel syndrome   . Benign neoplasm of colon     colon polyps  . Benign hypertrophy of prostate   . Erectile dysfunction   . Restless legs syndrome (RLS)   . Anxiety state, unspecified   . Onychomycosis   . Constipation, chronic   . Hypothyroidism   . Cancer [facility])     prostate cancer  . Glaucoma   . Radiation 06/20/15-08/02/15    right upper lung region 60 gray  . Encounter for antineoplastic chemotherapy 09/29/2015    Encounter Details:     CNP Questionnaire - 10/02/15 1358    Patient Demographics   Is this a new or existing patient? Existing   Patient is considered a/an Not Applicable   Race African-American/Black   Patient Assistance   Location of Patient Assistance [employer]   Patient's financial/insurance status Low Income;Medicare   Uninsured Patient No   Patient referred to apply for the following financial assistance Not Applicable   Food insecurities addressed Not Applicable   Transportation assistance No   Assistance securing medications No   Educational health offerings Cancer;Safety;Health literacy   Encounter Details   Primary purpose of visit Education/Health Concerns;Safety;Chronic Illness/Condition Visit;Spiritual Care/Support Visit   Was an Emergency Department visit averted? No   Does patient have a medical provider? Yes   Patient referred to Not Applicable   Was a mental health screening completed? (GAINS tool) No   Does patient have dental issues? No   Does patient have vision issues? Yes   Was a vision referral made? No   Since previous encounter, have you referred patient for abnormal blood pressure  that resulted in a new diagnosis or medication change? No   Since previous encounter, have you referred patient for abnormal blood glucose that resulted in a new diagnosis or medication change? No   For Abstraction Use Only   Does patient have insurance? Yes       Client reports that he has gotten relief from the pain in back and knees, feels much better now.  Has had less trouble swallowing.  Will follow as needed.

## 2015-10-06 NOTE — Progress Notes (Signed)
Introduced myself as his FA.  Informed him that unfortunately at this time there aren't any foundations offering copay assistance for his Dx.  The one foundation that would cover Alimta doesn't cover pt's w/ Medicare drug coverage but assured him if funding becomes available I will apply in his behalf.  I gave him my contact info for any questions or concerns he may have in the future.

## 2015-10-06 NOTE — Patient Instructions (Signed)
[facility] Discharge Instructions for Patients Receiving Chemotherapy  Today you received the following chemotherapy agents Alimta/Carboplatin  To help prevent nausea and vomiting after your treatment, we encourage you to take your nausea medication as prescribed.   If you develop nausea and vomiting that is not controlled by your nausea medication, call the clinic.   BELOW ARE SYMPTOMS THAT SHOULD BE REPORTED IMMEDIATELY:  *FEVER GREATER THAN 100.5 F  *CHILLS WITH OR WITHOUT FEVER  NAUSEA AND VOMITING THAT IS NOT CONTROLLED WITH YOUR NAUSEA MEDICATION  *UNUSUAL SHORTNESS OF BREATH  *UNUSUAL BRUISING OR BLEEDING  TENDERNESS IN MOUTH AND THROAT WITH OR WITHOUT PRESENCE OF ULCERS  *URINARY PROBLEMS  *BOWEL PROBLEMS  UNUSUAL RASH Items with * indicate a potential emergency and should be followed up as soon as possible.  Feel free to call the clinic you have any questions or concerns. The clinic phone number is [phone].  Please show the CHEMO ALERT CARD at check-in to the Emergency Department and triage nurse.   

## 2015-10-07 ENCOUNTER — Telehealth: Payer: Self-pay | Admitting: *Deleted

## 2015-10-07 NOTE — Telephone Encounter (Signed)
Follow up call placed, pt advised he feels good, little tired. We reviewed how to take Decadron, pt verbalized instructions and confirmed understanding. No further concerns.

## 2015-10-07 NOTE — Telephone Encounter (Signed)
-----   Message from Renford Dills, RN sent at 10/06/2015  9:27 AM EDT ----- Regarding: chemo f/u Mohamed 1st Alimta.

## 2015-10-13 ENCOUNTER — Other Ambulatory Visit (HOSPITAL_BASED_OUTPATIENT_CLINIC_OR_DEPARTMENT_OTHER): Payer: Medicare HMO

## 2015-10-13 DIAGNOSIS — C3411 Malignant neoplasm of upper lobe, right bronchus or lung: Secondary | ICD-10-CM | POA: Diagnosis not present

## 2015-10-13 LAB — CBC WITH DIFFERENTIAL/PLATELET
BASO%: 0.5 % (ref 0.0–2.0)
Basophils Absolute: 0 10*3/uL (ref 0.0–0.1)
EOS ABS: 0.1 10*3/uL (ref 0.0–0.5)
EOS%: 4.2 % (ref 0.0–7.0)
HCT: 39.4 % (ref 38.4–49.9)
HEMOGLOBIN: 13.4 g/dL (ref 13.0–17.1)
LYMPH%: 34.4 % (ref 14.0–49.0)
MCH: 30.4 pg (ref 27.2–33.4)
MCHC: 34 g/dL (ref 32.0–36.0)
MCV: 89.3 fL (ref 79.3–98.0)
MONO#: 0.1 10*3/uL (ref 0.1–0.9)
MONO%: 4.2 % (ref 0.0–14.0)
NEUT%: 56.7 % (ref 39.0–75.0)
NEUTROS ABS: 1.1 10*3/uL — AB (ref 1.5–6.5)
Platelets: 197 10*3/uL (ref 140–400)
RBC: 4.41 10*6/uL (ref 4.20–5.82)
RDW: 13.9 % (ref 11.0–14.6)
WBC: 1.9 10*3/uL — ABNORMAL LOW (ref 4.0–10.3)
lymph#: 0.7 10*3/uL — ABNORMAL LOW (ref 0.9–3.3)

## 2015-10-13 LAB — COMPREHENSIVE METABOLIC PANEL
ALK PHOS: 44 U/L (ref 40–150)
ALT: 9 U/L (ref 0–55)
AST: 12 U/L (ref 5–34)
Albumin: 3.3 g/dL — ABNORMAL LOW (ref 3.5–5.0)
Anion Gap: 7 mEq/L (ref 3–11)
BILIRUBIN TOTAL: 0.49 mg/dL (ref 0.20–1.20)
BUN: 13 mg/dL (ref 7.0–26.0)
CO2: 26 mEq/L (ref 22–29)
CREATININE: 1.1 mg/dL (ref 0.7–1.3)
Calcium: 9 mg/dL (ref 8.4–10.4)
Chloride: 109 mEq/L (ref 98–109)
EGFR: 79 mL/min/{1.73_m2} — ABNORMAL LOW (ref >90)
GLUCOSE: 116 mg/dL (ref 70–140)
Potassium: 3.5 mEq/L (ref 3.5–5.1)
SODIUM: 141 meq/L (ref 136–145)
TOTAL PROTEIN: 6.1 g/dL — AB (ref 6.4–8.3)

## 2015-10-17 ENCOUNTER — Other Ambulatory Visit: Payer: Self-pay | Admitting: Internal Medicine

## 2015-10-20 ENCOUNTER — Other Ambulatory Visit (HOSPITAL_BASED_OUTPATIENT_CLINIC_OR_DEPARTMENT_OTHER): Payer: Medicare HMO

## 2015-10-20 ENCOUNTER — Encounter: Payer: Self-pay | Admitting: Pulmonary Disease

## 2015-10-20 ENCOUNTER — Ambulatory Visit (INDEPENDENT_AMBULATORY_CARE_PROVIDER_SITE_OTHER): Payer: Medicare HMO | Admitting: Pulmonary Disease

## 2015-10-20 VITALS — BP 110/78 | HR 101 | Temp 97.2°F | Ht 66.0 in | Wt 165.0 lb

## 2015-10-20 DIAGNOSIS — K5730 Diverticulosis of large intestine without perforation or abscess without bleeding: Secondary | ICD-10-CM

## 2015-10-20 DIAGNOSIS — K219 Gastro-esophageal reflux disease without esophagitis: Secondary | ICD-10-CM

## 2015-10-20 DIAGNOSIS — E890 Postprocedural hypothyroidism: Secondary | ICD-10-CM

## 2015-10-20 DIAGNOSIS — E7800 Pure hypercholesterolemia, unspecified: Secondary | ICD-10-CM

## 2015-10-20 DIAGNOSIS — N401 Enlarged prostate with lower urinary tract symptoms: Secondary | ICD-10-CM

## 2015-10-20 DIAGNOSIS — C3411 Malignant neoplasm of upper lobe, right bronchus or lung: Secondary | ICD-10-CM

## 2015-10-20 DIAGNOSIS — C61 Malignant neoplasm of prostate: Secondary | ICD-10-CM

## 2015-10-20 DIAGNOSIS — N138 Other obstructive and reflux uropathy: Secondary | ICD-10-CM

## 2015-10-20 DIAGNOSIS — K5909 Other constipation: Secondary | ICD-10-CM

## 2015-10-20 DIAGNOSIS — I10 Essential (primary) hypertension: Secondary | ICD-10-CM

## 2015-10-20 LAB — CBC WITH DIFFERENTIAL/PLATELET
BASO%: 0 % (ref 0.0–2.0)
BASOS ABS: 0 10*3/uL (ref 0.0–0.1)
EOS%: 2.2 % (ref 0.0–7.0)
Eosinophils Absolute: 0.1 10*3/uL (ref 0.0–0.5)
HCT: 41.5 % (ref 38.4–49.9)
HGB: 14.2 g/dL (ref 13.0–17.1)
LYMPH%: 26.3 % (ref 14.0–49.0)
MCH: 30.9 pg (ref 27.2–33.4)
MCHC: 34.2 g/dL (ref 32.0–36.0)
MCV: 90.4 fL (ref 79.3–98.0)
MONO#: 0.3 10*3/uL (ref 0.1–0.9)
MONO%: 14 % (ref 0.0–14.0)
NEUT#: 1.3 10*3/uL — ABNORMAL LOW (ref 1.5–6.5)
NEUT%: 57.5 % (ref 39.0–75.0)
NRBC: 0 % (ref 0–0)
Platelets: 166 10*3/uL (ref 140–400)
RBC: 4.59 10*6/uL (ref 4.20–5.82)
RDW: 14.6 % (ref 11.0–14.6)
WBC: 2.3 10*3/uL — AB (ref 4.0–10.3)
lymph#: 0.6 10*3/uL — ABNORMAL LOW (ref 0.9–3.3)

## 2015-10-20 LAB — COMPREHENSIVE METABOLIC PANEL
ALT: 17 U/L (ref 0–55)
AST: 17 U/L (ref 5–34)
Albumin: 3.6 g/dL (ref 3.5–5.0)
Alkaline Phosphatase: 52 U/L (ref 40–150)
Anion Gap: 8 mEq/L (ref 3–11)
BILIRUBIN TOTAL: 0.37 mg/dL (ref 0.20–1.20)
BUN: 15.6 mg/dL (ref 7.0–26.0)
CHLORIDE: 109 meq/L (ref 98–109)
CO2: 24 meq/L (ref 22–29)
Calcium: 9.6 mg/dL (ref 8.4–10.4)
Creatinine: 1.1 mg/dL (ref 0.7–1.3)
EGFR: 80 mL/min/{1.73_m2} — AB (ref >90)
GLUCOSE: 106 mg/dL (ref 70–140)
POTASSIUM: 3.7 meq/L (ref 3.5–5.1)
SODIUM: 141 meq/L (ref 136–145)
TOTAL PROTEIN: 6.7 g/dL (ref 6.4–8.3)

## 2015-10-20 NOTE — Patient Instructions (Signed)
Today we updated your med list in our EPIC system...    Continue your current medications the same...  Call for any questions or if we can be of service in any way...  Let's plan a follow up visit in 12mo sooner if needed for problems..Marland KitchenMarland Kitchen

## 2015-10-20 NOTE — Progress Notes (Signed)
Subjective:     Patient ID: Alan [name], male   DOB: 1941-01-14, 75 y.o.   MRN: [id number]  HPI 75 y/o BM here for a follow up visit... he has mult med problems as noted below...  ~  SEE PREV EPIC NOTES FOR OLDER DATA >>    LABS 10/13:  FLP- at goals on Simva20;  Chems- wnl x BS=108;  CBC- wnl;  TSH=0.34 & we will recheck w/ FreeT3 & FreeT4...  ADDENDUM>> f/u TFTs 10/13 showed normal TSH=0.93 & normal FreeT3 & FreeT4...   LABS from July-Aug2014 reviewed in [facility]...   ~  March 02, 2014:  34moROV & add-on appt requested by pt's wife for mult complaints (he says he's ok, no big deal)- pain in left side of neck & shoulder (he thinks it's from his air conditioning); indigestion (he thinks it's panic attacks);  SOB/DOE going up stairs (he feels it's his meds- "the meds catch up w/ me"); he notes that he is very active- yard, garden, vNature conservation officerwork at [employer] we discussed his options- doesn't want anxiolytic, he wants to stop one med at a time to see if the SOB improves- OK... We reviewed the following medical problems during today's office visit >>     HBP>  On ASA81, Norvasc5; BP= 138/70 & he denies CP, palpit, SOB, edema, etc...    CHOL>  On Simva20 + diet; FLP 9/15 showed TChol 135, TG 66, HDL 38, LDL 84; continue same med & diet...    GERD>  On Prilosec20; he had LapAppy 8/14 by DrHoxworth & now back to baseline w/o abd pain, n/v, c/d, blood seen...    Divertics, IBS w/ constip, Colon Polyps>  Off prev Linzess, Chronulac, Miralax; had colonoscopy 2/14 w/ int hems, otherw wnl, & f/u rec in 74yr..    BPH>  On [name]5 & Flomax0.4; followed by [name] (he does pt's PSA) [facility]/ [facility]; pt tells me he was seen 7/15 & they plan Bx of prostate soon (we don't have notes).    RLS>  Not on meds his symptoms are not that bad & he is managing well "I deal w/ it- walking helps"...    Anxiety>  Not on meds and states he is doing well overall... We  reviewed prob list, meds, xrays and labs> see below for updates >>   EKG 9/15 showed NSR, rate78, wnl...  LABS 9/15:  FLP- wnl on Simva20;  Chems- wnl;  CBC- wnl;  TSH=0.02... 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  ~  April; 20, 2016:  69m469moV & medical follow up> he was seen by DrGherghe for Endocrine- subclinical hyperthyroidism=> toxic adenoma (warm nodule right lower pole) on further eval w/ RAI uptake & scan and treated w/ RAI 06/2014; they have continued to follow up post treatment and started Synthroid 27m369mhen his TSH rose to 9.15, he has a recheck scheduled in 80mo 669moey plan recheck thyroid labs at that time...     Today RoberFeliceseveral concerns to discuss:  1) decreased hearing- no cerumen impactions on exam & we rec referral to ENT office for audiology testing;  2) left breast is sore/ swollen- exam shows gynecomastia &he is on [name], asked to stop this med & notify urology; he was also Dx  w/ Prostate Ca by [name] on watchful waiting;  3) constipation- prev on Linzess but he stopped & Miralax etc not working (needs enema or suppos to go), rec to restart the Linzess145/d & let us know...  We reviewed the following medical problems during today's office visit >>     HBP>  On ASA81, Norvasc5; BP= 134/70 & he denies CP, palpit, SOB, edema, etc...    CHOL>  On Simva20 + diet; FLP 9/15 showed TChol 135, TG 66, HDL 38, LDL 84; continue same med & diet...    Thyroid adenoma w/ hyperthyroidism> referred to DrGherghe w/ RAI uptake & scan w/ warm nodule right lower pole; s/p RAI rx 06/2014=> subseq started Synthroid37mg/d & they continue to follow...    GERD>  On Prilosec20; he had LapAppy 8/14 by DrHoxworth & now back to baseline w/o abd pain, n/v, c/d, blood seen...    Divertics, IBS w/ constip,  Colon Polyps>  On Chronulac & Miralax but needs enema or suppos to go; had colonoscopy 2/14 w/ int hems, otherw wnl, & f/u rec in 769yr rec to restart [id number] let usKoreanow response...    BPH, prostate ca>  On [name]5 & Flomax0.4 per [name] w/ [facility]; rising PSA=> Bx 9/15 pos for sm vol prostate cancer, on watchful waiting protocol & Urology follow up; he developed left gynecomastia 4/16 & [name] stopped=> he will notify Urology...    RLS>  Not on meds his symptoms are not that bad & he is managing well "I deal w/ it- walking helps"...    Anxiety>  Not on meds and states he is doing well overall... We reviewed prob list, meds, xrays and labs> see below for updates >>  PLAN>> we will refer to ENT for hearing eval; stop [name] due to gynecomastia & notify [name]; start Linzess145 daily & he will let usKoreanow how it is working so we can assess the dose...   ~  April 21, 2015:  68m38moV and RobVerdelld f/u DrGherghe in the interval w/ elev TSH after his RAI treatment for thyroid adenoma- now on Synthroid88 and TSH looks good;  He is still c/o bilat gynecomastia L>R & sl larger & sl tender, this is likely due to the [name] but he tells me that [name] wouldn't stop it (known prostate cancer on watchful waiting)- I will forward this note to DrT and asked pt to contact him regarding options for treating the prostate cancer & getting off the [name];  Also c/o persistent constipation- he tried Linzess145& says it initially helped, then stopped working so he stopped it rather than calling for instructions; we discussed increase to Linzess290 daily & we will trefer to GI for further eval... We reviewed the following medical problems during today's office visit >>     New problem 10/16:  RUL oblong pulm nodule> ?etiology- he is asymptomatic & exam is neg, ex-smoker w/ 30pack-yr hx and quit 1970, we will proceed w/ CT Chest => pending    HBP>   On ASA81, Norvasc5; BP= 122/78 & he denies CP, palpit, SOB, edema, etc...    CHOL>  On Simva20 + diet; FLP 10/20 showed TChol 162, TG 66, HDL 55, LDL 94; continue same med & diet...    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    GERD>  On Prilosec20; he had LapAppy 8/14 by DrHoxworth & now back to baseline w/o abd pain, dysphagia, n/v, diarrhea, blood seen, etc...    Divertics, IBS w/ constip, Colon Polyps>  On Chronulac & Miralax but still needs enema or suppos to go; had colonoscopy 2/14 w/ int hems, otherw wnl, & f/u rec in 69yr; [facility] to inc[facility] dose to '290mg'$ /d...     BPH w/ BOO, prostate ca>  On [name]5 & Flomax0.8 per [name] [facility]/ [facility]; rising PSA=> Bx 9/15 pos for sm vol prostate cancer, on watchful waiting protocol & Urology follow up; he developed left gynecomastia 4/16 & I rec stopping the [name] but he tells me that DrT continued this med, now gynecomastia getting worse & looks like he'll need alternative treatment for the prostate ca & get off the [name]...     RLS>  Not on meds, his symptoms are not that bad & he is managing well "I deal w/ it- walking helps"...    Anxiety>  Not on meds and states he is doing well overall... We reviewed prob list, meds, xrays and labs> see below for updates >>   CXR 04/21/15 shows norm heart size, new oblong RUL nodular lesion, otherw neg/ no adenopathy, etc => proceed w/ CT Chest  LABS 04/21/15:  FLP- at goals on Simva20;  Chems- wnl x BS=113;  CBC- wnl;  TSH=2.29 on Synthroid88... PLAN>>  CXR shows new oblong RUL opacity ?etiology- no resp symptoms, neg exam, norm CBC etc => we will proceed w/ further eval & CT Chest;  Gynecomastia is getting worse, still on [name], he is asked to f/u w/  [name] ASAP & discuss alternative prostate cancer treatments & stopping the Finasteride;  He will increase the Linzess to '290mg'$  daily & continue the Miralax/ Chronulac => follow up w/ GI id constipation not resolved on this regimen...  ADDENDUM>>  CT Chest 05/02/15 showed norm heart size, atherosclerotic Ao, mild mediastinal adenopathy, marked left gynecomastia, mild centrilobular emphysema, posterior RUL nodule measures 2.2 x 2.6 cm, small 455msubpleural RLL nodule, ?sub-solid 84m45meft apical nodule ==> discussed w/ pt, proceed w/ PET Scan... ADDENDUM>>  PET/CT done 05/18/16 showed 2.8cm hypermet posterior RUL nodule, +hypermet right hilar & right paratracheal adenopathy; asymmetric left breast soft tissue density w/ low-grade metabolic activ (differential betw gynecomastia vs breast cancer- consider mammogram etc) ==> discussed w/ pt, I believe the best approach to Bx/tissue dx would be TCTS referral for EBUS/mediastinoscopy... ADDENDUM>>  Bronch & Mediastinoscopy done 05/30/16 by DrBartle w/ pos mediastinal node biopsies=> moderately differentiated adenocarcinoma, stage 3a...  ~  October 20, 2015:  84mo58mo & MrTaylor saw DrMohammed & DrKinard>  <DIAGNOSIS: Stage IIIA (T1b, N2, M0) non-small cell lung cancer, adenocarcinoma presented with right right upper lobe nodule in addition to mediastinal lymphadenopathy diagnosed in November 2016. <PRIOR THERAPY: Concurrent chemoradiation with weekly carboplatin for AUC of 2 & paclitaxel 45 mg/M2, s/p 5 cycles- last dose on 07/18/2015 with partial response to this and XRT- 60 gray in 30 fractions from 06/20/15 to 08/02/15. <He was given the option of surgical resection but he declined, preferring consolidation chemotherapy=> and they have chosen carboplatin & Alimta... <CURRENT THERAPY: Consolidation chemotherapy with carboplatin for AUC of 5 and Alimta 500 MG/M2 every C weeks. First dose 10/06/2015; he is also getting B12 shots; tolerated the first cycle  well...Marland KitchenMarland Kitchen  HBP>  On ASA81, Norvasc5; BP= 110/78 & he denies CP, palpit, SOB, edema, etc...    CHOL>  On Simva20 + diet; FLP 04/2015 showed TChol 162, TG 66, HDL 55, LDL 94; continue same med & diet...    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    GERD>  On Prilosec20+Carafate; he had [facility]y 01/2013 [facility] & now back to baseline w/o abd pain, dysphagia, n/v, diarrhea, blood seen, etc...    Divertics, IBS w/ constip, Colon Polyps>  On Linzess290, Chronulac, Miralax- improved & goes qod; had colonoscopy 2/14 w/ int hems, otherw wnl, & f/u rec in 55yr...    BPH w/ BOO, prostate ca>  On [name]5 & Flomax0.8 per [name] w/ [facility]; rising PSA=> Bx 03/2014 pos for sm vol prostate cancer, on watchful waiting protocol & Urology follow up; he developed left gynecomastia 4/16 & I rec stopping the [name] but he tells me that DrT continued this med, now gynecomastia getting worse & looks like he'll need alternative treatment for the prostate ca & get off the [name]...     RLS>  Not on meds, his symptoms are not that bad & he is managing well "I deal w/ it- walking helps", uses Tramadol prn...    Anxiety>  Not on meds and states he is doing well overall... EXAM shows Afeb, VSS, O2sat=97% on RA at rest;  Wt=165# (down 8# over last 628mo  HEENT- neg, mallampati2;  Chest- clear w/o w/r/r;  Heart- RR w/o m/r/g;  Abd- soft, nontender, neg;  Ext- neg w/o c/c/e;  Neuro- intact...   MRI Head 06/21/15>  Neg- no evid of mets  FullPFTs 09/16/15>  FVC=4.03 (127%), FEV1=3.03 (131%), %1sec=75, mid-flows wnl;  TLC=5.95 (95%), RV=1.79 (76%), RV/TLC=30%;  DLCO is reduced at 55% predicted, DL/VA=65...   CT Chest 09/05/15>  No enlarged mediastinal or hilar nodes, post RUL nodule is smaller  than prev indicating a partial interval treatment response... IMP/PLAN>>  RoCrosbyook good/ feels good & his attitude is great. No receiving consolidation chemotherapy from DrMohamed; continue current meds...           Problem List:   OBSTRUCTIVE SLEEP APNEA (ICD-327.23) - s/p sleep study 1/08 showing RDI=16, and desat to 89%... he had 4+leg jerks and given trial Requip (?helped)... states he rests well and wakes feeling refreshed most of the time "I live w/ it, it's not that bad"...  RUL PULMONARY NODULE >> new finding on routine CXR 04/21/15 => work up revealed mod diff ADENOCARCINOMA w/ pos mediastinal adenopathy ~  CXR 04/21/15 shows norm heart size, new oblong RUL nodular lesion, otherw neg/ no adenopathy, etc => proceed w/ CT Chest ~  CT Chest 05/02/15 showed norm heart size, atherosclerotic Ao, mild mediastinal adenopathy, marked left gynecomastia, mild centrilobular emphysema, posterior RUL nodule measures 2.2 x 2.6 cm, small 66m42mubpleural RLL nodule, ?sub-solid 6mm79mft apical nodule ==> discussed w/ pt, proceed w/ PET Scan & see work up outlined above...  HYPERTENSION (ICD-401.9) - controlled on NORVASC '5mg'$ /d, and prev on Cardura but this was switched by [name]...  ~  10/13: BP=118/82, tol meds well... denies HA, fatigue, visual changes, CP, palipit, dizziness, syncope, dyspnea, edema, etc... he had a Cardiolite scan in 1997 that was neg/ normal w/ EF=75%... ~  CXR 4/14 showed norm heart size, atherosclerotic in ThorAo, clear lungs, NAD... ~  12/14: on ASA81, Norvasc5; BP= 120/84 & he denies CP, palpit, SOB, edema, etc ~  9/15: on ASA81, Norvasc5; BP= 138/70 & he denies CP, palpit, SOB, edema, etc ~  4/16: on ASA81, Norvasc5; BP= 134/70 & he remains asymptomatic... ~  10/16: on ASA81, Norvasc5; BP= 122/78 & he denies CP, palpit, SOB, edema, etc  HYPERCHOLESTEROLEMIA (ICD-272.0) - on SIMVASTATIN '20mg'$ /d + diet...  ~  [place] 3/08 showed TChol 157, TG 53, HDL 53, LDL 94 ~  FLP 3/09  showed TChol 124, TG 33, HDL 44, LDL 74 ~  FLP 4/10 showed TChol 123, TG 36, HDL 41, LDL 75 ~  FLP 3/12 showed TChol 148, TG 47, HDL 49, LDL 89... continue Simva20. ~  [place] 10/13 on Simva20 showed TChol 146, TG 68, HDL 45, LDL 88... Continue same. ~  FLP 9/15 on Simva20 showed TChol 135, TG 66, HDL 38, LDL 84 ~  FLP 10/16 on simva20 showed TChol 162, TG 66, HDL 55, LDL 94  SUBCLINICAL HYPERTHYROIDISM >> see notes above...  ~  Labs 9/15 showed TSH=0.02 and repeat labs showed TSH=0.03, FreeT3=3.5 (2.3-4.2), FreeT4=1.09 (0.6-1.6) => we will refer to endocrine for further eval... ~  Seen by DrGherghe w/ RAI uptake & scan showing a warm nodule right lower pole; treated 06/2014 w/ RAI & started on Synthroid 07/2014, currently 67mg/d & they plan f/u soon... ~  10/16: he has developed post-ablative hypothyroidism & Synthroid titrated up to 826m/d; TSH 04/21/15 => 2.29, continue same...  GERD (ICD-530.81) - prev rec to take Prevacid '30mg'$  & Zantac 150Qhs... GI eval 1/11 by DrDemetra Shiner/ rec to take PRILOSEC daily ~  EGD 5/02 showing esophagitis and gastritis... ~  EGD 1/11 showed GE junction erosions & duodenitis... Rx Prilosec & improved symptomatically.  DIVERTICULOSIS OF COLON (ICD-562.10)  IRRITABLE BOWEL SYNDROME w/ CONSTIPATION  COLONIC POLYPS (ICD-211.3) Acute APPENDICITIS w/ surg 7/14  ~  colonoscopy 5/07 by DrSam showing divertics and 3-11m77mdenomatous polyps removed... f/u planned 66yr766yr ~  colonoscopy 1/11 w/ several pedunculated polyps & path was neg- benign colonic mucosa w/o adenomatous change. ~  Colonoscopy 2/14 by [name] showed int hems, otherw neg, and f/u rec in 7 yrs...  ~  5/14: initial hosp for RLQ pain & dx w/ appendicitis- treated non-operatively ~  CT Abd & Pelvis 7/14 showed suspected acute on chr perforated appendicitis w/ sm periappendiceal fluid collection, no free air; enlarged prostate, mild degen changes in Tspine, sm bilat renal cysts...  ~  C/o worsening constipation  & treated w/ chronulac/ miralax/ LINZESS145=> initially helped then he stopped it; Rec to incr Linzess to '290mg'$ /d & follow up w/ GI if not resolved...  BENIGN PROSTATIC HYPERTROPHY, HX OF (ICD-V13.8) >>  PROSTATE CANCER >>  GYNECOMASTIA >>  ~  seen by DrHumphries previously=> now seeing [name] but pt doesn't feel that he is doing as well on the FLOMAX 0.'4mg'$ /d (prev on [name] + Cardura)... I've encouraged him to discuss this w/ [name]... 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 ~  5/14:  He was seen by [name] during his 5/14 hosp for appendicitis; CT showed prostatic enlargement, BPH, BOO, & he was rec to f/u as outpt for poss cysto... ~  9/15:  He tells me he  saw [name] 7/15 w/ elev PSA & they plan prostate bx soon (we don't have recent notes from them)... ~  9/15:  Diagnosed w/ prostate cancer on bx by [name]- Gleason 3+4=7, PSA was 4.3, pos FamHx w/ father & one brother passing from prostate ca; He is on [name], Flomax, watchful waiting...  ~  4/16:  He has developed gynecomastia likely due to the Finasteride med- he tells me that [name] doesn't want to stop this med... ~  10/16:  He is c/o worsening L>R gynecomastia & still on [name]; he is asked to f/u w/ [name] to discuss alternative prostate cancer treatments so he can come off the [name]...  ERECTILE DYSFUNCTION (ICD-302.72) - Tried all 3 PDE4 inhibitors, but not much benefit... consider shots.  ~  labs in 09/19/08 at Urology office was 1.47 per patient. ~  09-20-1999: he tells me he had his PSA checked at [place] it was "OK"...  Hx POS RPR and T-PALLIDUM Antibodies >> labs confirmed pos in 09/19/09 and he went to [place] at health dept for 2nd opinion; labs confirmed & DrRobinson wanted him to get Neuro  eval w/ LP; seen by DrYan for [facility]- CSF studies to T-Pallidum were neg; treated w/ series of 3 Benzathine PenG injections...  ~  MRI Brain 8/11 showed a few nonspecific areas of bifrontal gliosis and a partially empty sella...   RESTLESS LEG SYNDROME (ICD-333.94) - prev on Requip '1mg'$ Qhs, but stopped this med due to stomach side effects ... he notes no diff w/ resting- good nights, and bad nights...   ANXIETY (ICD-300.00) - he does not desire anxiolytic Rx...  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   Past Medical History  Diagnosis Date  . Sleep apnea     does not use cpap  . Unspecified essential hypertension   . Hypercholesteremia   . GERD (gastroesophageal reflux disease)   . Diverticulosis of colon (without mention of hemorrhage)   . Irritable bowel syndrome   . Benign neoplasm of colon     colon polyps  . Benign hypertrophy of prostate   . Erectile dysfunction   . Restless legs syndrome (RLS)   . Anxiety state, unspecified   . Onychomycosis   . Constipation, chronic   . Hypothyroidism   . Cancer [facility])     prostate cancer  . Glaucoma   . Radiation 06/20/15-08/02/15    right upper lung region 60 gray  . Encounter for antineoplastic chemotherapy 09/29/2015    Past Surgical History  Procedure Laterality Date  . None    . Colonoscopy    . Laparoscopic appendectomy N/A 01/27/2013    Procedure: APPENDECTOMY LAPAROSCOPIC;  Surgeon: Edward Jolly, MD;  Location: [facility];  Service: General;  Laterality: N/A;  . Appendectomy  12/2012  . Thyroidectomy    . Tonsillectomy    . Cardiac catheterization  ? Sep 20, 2006  . Video bronchoscopy N/A 05/31/2015    Procedure: VIDEO BRONCHOSCOPY;  Surgeon: Gaye Pollack, MD;  Location: [place];  Service: Thoracic;  Laterality: N/A;  . Mediastinoscopy N/A 05/31/2015    Procedure: MEDIASTINOSCOPY;  Surgeon: Gaye Pollack, MD;  Location: [facility];  Service: Thoracic;  Laterality: N/A;    Outpatient Encounter  Prescriptions as of 10/20/2015  Medication Sig  . amLODipine (NORVASC) 5 MG tablet Take 1 tablet (5 mg total) by mouth every morning.  [name] aspirin EC 81 MG tablet Take 81 mg by mouth daily.   . brimonidine-timolol (  COMBIGAN) 0.2-0.5 % ophthalmic solution Place 1 drop into both eyes every 12 (twelve) hours.  . chlorproMAZINE (THORAZINE) 25 MG tablet Take 1 tablet (25 mg total) by mouth 3 (three) times daily as needed for hiccoughs.  [name] dexamethasone (DECADRON) 4 MG tablet 4 mg by mouth twice a day the day before, day of and day after the chemotherapy every 3 weeks.  . finasteride ([name]) 5 MG tablet Take 5 mg by mouth daily.  . fluticasone (FLONASE) 50 MCG/ACT nasal spray Place 2 sprays into both nostrils daily. (Patient taking differently: Place 2 sprays into both nostrils daily as needed for allergies or rhinitis. )  . folic acid (FOLVITE) 1 MG tablet Take 1 tablet (1 mg total) by mouth daily.  [name] lactulose (CHRONULAC) 10 GM/15ML solution Take 10 g by mouth 2 (two) times daily as needed (constipation).   [name] levothyroxine (SYNTHROID, LEVOTHROID) 100 MCG tablet Take 1 tablet (100 mcg total) by mouth daily.  . Linaclotide (LINZESS) 290 MCG CAPS capsule Take 1 capsule (290 mcg total) by mouth daily.  . meloxicam (MOBIC) 7.5 MG tablet Take 1 tablet by mouth daily.  [name] omeprazole (PRILOSEC) 20 MG capsule TAKE ONE CAPSULE BY MOUTH IN THE MORNING  . polyethylene glycol (MIRALAX / GLYCOLAX) packet Take 17 g by mouth daily as needed (constipation). Reported on 06/21/2015  . prochlorperazine (COMPAZINE) 10 MG tablet Take 1 tablet (10 mg total) by mouth every 6 (six) hours as needed for nausea or vomiting.  . sildenafil (VIAGRA) 100 MG tablet Take 1 tablet (100 mg total) by mouth as directed.  . simvastatin (ZOCOR) 20 MG tablet TAKE ONE TABLET BY MOUTH AT BEDTIME  . sucralfate (CARAFATE) 1 g tablet Take 1 tablet (1 g total) by mouth 4 (four) times daily -  with meals and at bedtime. Mix in 2Tbs water, swish and  swallow  . tamsulosin (FLOMAX) 0.4 MG CAPS capsule Take 1 capsule (0.4 mg total) by mouth every evening. (Patient taking differently: Take 0.8 mg by mouth at bedtime. )  . traMADol (ULTRAM) 50 MG tablet Take 1 tablet (50 mg total) by mouth every 6 (six) hours as needed for moderate pain.  . [DISCONTINUED] levofloxacin (LEVAQUIN) 500 MG tablet Take 1 tablet by mouth daily.  . [DISCONTINUED] levothyroxine (SYNTHROID, LEVOTHROID) 88 MCG tablet TAKE ONE TABLET BY MOUTH ONCE DAILY   No facility-administered encounter medications on file as of 10/20/2015.    Allergies  Allergen Reactions  . Codeine Other (See Comments)    : hallucinations,faint,dizziness    Current Medications, Allergies, Past Medical History, Past Surgical History, Family History, and Social History were reviewed in [employer] record.   Review of Systems        See HPI - all other systems neg except as noted... The patient c/o constipation and left gynecomastia; he denies anorexia, fever, weight loss, weight gain, vision loss, decreased hearing, hoarseness, chest pain, syncope, dyspnea on exertion, peripheral edema, prolonged cough, headaches, hemoptysis, abdominal pain, melena, hematochezia, severe indigestion/heartburn, hematuria, incontinence, muscle weakness, suspicious skin lesions, transient blindness, difficulty walking, depression, unusual weight change, abnormal bleeding, enlarged lymph nodes, and angioedema.     Objective:   Physical Exam    WD, WN, 75 y/o BM in NAD... GENERAL:  Alert & oriented; pleasant & cooperative... HEENT:  [place]/AT, EOM-wnl, PERRLA, EACs-clear, TMs-wnl, NOSE-clear, THROAT-clear & wnl. NECK:  Supple w/ full ROM; no JVD; normal carotid impulses w/o bruits; no thyromegaly or nodules palpated; no lymphadenopathy. CHEST:  Clear to  P & A; without wheezes/ rales/ or rhonchi. Chest Wall>  L>R gynecomastia, sl tender on paplation HEART:  Regular Rhythm; without murmurs/ rubs/  or gallops. ABDOMEN:  Soft & nontender; normal bowel sounds; no organomegaly or masses detected. EXT: without deformities, mild arthritic changes; no varicose veins/ venous insuffic/ or edema. NEURO:  CN's intact; motor testing normal; sensory testing normal; gait normal & balance OK. DERM:  No lesions noted; no rash etc...  RADIOLOGY DATA:  Reviewed in the EPIC EMR & discussed w/ the patient...  LABORATORY DATA:  Reviewed in the EPIC EMR & discussed w/ the patient...   Assessment:      RUL pulmonary nodule>> routine CXR 04/21/15 showed RUL nodule- pt asymptomatic, quit smoking 1970, & we will proceed w/ CT Chest... Eval revealed Adenocarcinoma w/ +mediastinal nodes; treated w/ chemoradiiation w/ part response, pt declined surg resection & opted for consolidation chemotherapy=> in process now.   HBP>  Well controlled on Amlod5; continue same + diet & low sodium...  CHOL>  Well regulated on Simva20; continue same...  Hyperthyroid, toxic adenoma, s/p RAI & post-ablative hypothyroid>  TSH now wnl at 2.29 on Synthroid88...  GERD>  Stable on Prilosec20; continue same Rx...  Divertics, IBS, Colon Polyps, constipation>  He states Linzess145 stopped working; Rec to use the Miralax + [employer] '290mg'$ /d, follow up w/ GI...  S/P Appendectomy 8/14 by DrHoxworth...  BPH, prostate cancer, gynecomastia>  Followed by [name] on Flomax & [name] but gynecomastia is getting worse 7 he is encouraged to discuss alternative prostate cancer treatments so he can stop the [name]...  RLS>  States it's not that bad & not requiring meds...  Anxiety>  Similarly, he notes not that bad & not on meds...     Plan:     Patient's Medications  New Prescriptions   No medications on file  Previous Medications   AMLODIPINE (NORVASC) 5 MG TABLET    Take 1 tablet (5 mg total) by mouth every morning.   ASPIRIN EC 81 MG TABLET    Take 81 mg by mouth daily.    BRIMONIDINE-TIMOLOL (COMBIGAN) 0.2-0.5 % OPHTHALMIC  SOLUTION    Place 1 drop into both eyes every 12 (twelve) hours.   CHLORPROMAZINE (THORAZINE) 25 MG TABLET    Take 1 tablet (25 mg total) by mouth 3 (three) times daily as needed for hiccoughs.   DEXAMETHASONE (DECADRON) 4 MG TABLET    4 mg by mouth twice a day the day before, day of and day after the chemotherapy every 3 weeks.   FINASTERIDE ([name]) 5 MG TABLET    Take 5 mg by mouth daily.   FLUTICASONE (FLONASE) 50 MCG/ACT NASAL SPRAY    Place 2 sprays into both nostrils daily.   FOLIC ACID (FOLVITE) 1 MG TABLET    Take 1 tablet (1 mg total) by mouth daily.   LACTULOSE (CHRONULAC) 10 GM/15ML SOLUTION    Take 10 g by mouth 2 (two) times daily as needed (constipation).    LEVOTHYROXINE (SYNTHROID, LEVOTHROID) 100 MCG TABLET    Take 1 tablet (100 mcg total) by mouth daily.   LINACLOTIDE (LINZESS) 290 MCG CAPS CAPSULE    Take 1 capsule (290 mcg total) by mouth daily.   MELOXICAM (MOBIC) 7.5 MG TABLET    Take 1 tablet by mouth daily.   OMEPRAZOLE (PRILOSEC) 20 MG CAPSULE    TAKE ONE CAPSULE BY MOUTH IN THE MORNING   POLYETHYLENE GLYCOL (MIRALAX / GLYCOLAX) PACKET    Take 17 g by  mouth daily as needed (constipation). Reported on 06/21/2015   PROCHLORPERAZINE (COMPAZINE) 10 MG TABLET    Take 1 tablet (10 mg total) by mouth every 6 (six) hours as needed for nausea or vomiting.   SILDENAFIL (VIAGRA) 100 MG TABLET    Take 1 tablet (100 mg total) by mouth as directed.   SIMVASTATIN (ZOCOR) 20 MG TABLET    TAKE ONE TABLET BY MOUTH AT BEDTIME   SUCRALFATE (CARAFATE) 1 G TABLET    Take 1 tablet (1 g total) by mouth 4 (four) times daily -  with meals and at bedtime. Mix in 2Tbs water, swish and swallow   TAMSULOSIN (FLOMAX) 0.4 MG CAPS CAPSULE    Take 1 capsule (0.4 mg total) by mouth every evening.   TRAMADOL (ULTRAM) 50 MG TABLET    Take 1 tablet (50 mg total) by mouth every 6 (six) hours as needed for moderate pain.  Modified Medications   No medications on file  Discontinued Medications   LEVOFLOXACIN  (LEVAQUIN) 500 MG TABLET    Take 1 tablet by mouth daily.   LEVOTHYROXINE (SYNTHROID, LEVOTHROID) 88 MCG TABLET    TAKE ONE TABLET BY MOUTH ONCE DAILY

## 2015-10-23 DIAGNOSIS — M25559 Pain in unspecified hip: Secondary | ICD-10-CM

## 2015-10-25 NOTE — Congregational Nurse Program (Signed)
Congregational Nurse Program Note  Date of Encounter: 10/23/2015  Past Medical History: Past Medical History  Diagnosis Date  . Sleep apnea     does not use cpap  . Unspecified essential hypertension   . Hypercholesteremia   . GERD (gastroesophageal reflux disease)   . Diverticulosis of colon (without mention of hemorrhage)   . Irritable bowel syndrome   . Benign neoplasm of colon     colon polyps  . Benign hypertrophy of prostate   . Erectile dysfunction   . Restless legs syndrome (RLS)   . Anxiety state, unspecified   . Onychomycosis   . Constipation, chronic   . Hypothyroidism   . Cancer [facility])     prostate cancer  . Glaucoma   . Radiation 06/20/15-08/02/15    right upper lung region 60 gray  . Encounter for antineoplastic chemotherapy 09/29/2015    Encounter Details:     CNP Questionnaire - 10/23/15 1558    Patient Demographics   Is this a new or existing patient? Existing   Patient is considered a/an Not Applicable   Race African-American/Black   Patient Assistance   Location of Patient Assistance [employer]   Patient's financial/insurance status Low Income;Medicare   Uninsured Patient No   Patient referred to apply for the following financial assistance Not Applicable   Food insecurities addressed Not Applicable   Transportation assistance No   Assistance securing medications No   Educational health offerings Cancer;Safety;Health literacy   Encounter Details   Primary purpose of visit Education/Health Concerns;Safety;Chronic Illness/Condition Visit;Spiritual Care/Support Visit   Was an Emergency Department visit averted? No   Does patient have a medical provider? Yes   Patient referred to Not Applicable   Was a mental health screening completed? (GAINS tool) No   Does patient have dental issues? No   Does patient have vision issues? Yes   Was a vision referral made? No   Since previous encounter, have you referred patient for abnormal blood pressure  that resulted in a new diagnosis or medication change? No   Since previous encounter, have you referred patient for abnormal blood glucose that resulted in a new diagnosis or medication change? No   For Abstraction Use Only   Does patient have insurance? 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  Client reports had very bad day yesterday with pain in his coccyx.  He was out in community and had to sit for a long time before he could get home.  He has pain medication prescribed and has been taking it.  Today, he was weak and felt unable to preach.  He has been told that white blood count"down" and he should not eat out and should avoid contact with anyone who is ill.  My recommendation was not to be in crowds here at church.  He does seem to understand that he could be exposed to an infection that could be dangerous.  He is scheduled to have another treatment next Thursday. Pastor's daughter was taken by ambulance to hospital after fainting in service and having stroke like symptoms.  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  Deacons to take him home today.    Will followup with him.

## 2015-10-26 ENCOUNTER — Telehealth: Payer: Self-pay | Admitting: Medical Oncology

## 2015-10-26 NOTE — Telephone Encounter (Addendum)
Reports intense constant left hip pain on the side around to buttocks- 8/10. Pain started sat and getting worse. It hurts to put weight on left leg and " I can hardly walk".  His BM pattern is normal . He took tylenol with little relief. He is scheduled to come in tomorrow. Note to [place].

## 2015-10-26 NOTE — Telephone Encounter (Deleted)
Reports intense constant right hip pain on the side around to buttocks- 8/10. Pain started sat and getting worse. It hurts to put weight on left leg and " I can hardly walk".  His BM pattern is normal . He took tylenol with little relief. He is scheduled to come in tomorrow.

## 2015-10-26 NOTE — Telephone Encounter (Signed)
PEr [name] I told pt to keep appt tomorrow and to go to ED if pain and mobility symptoms worsen-pt voices understanding.

## 2015-10-27 ENCOUNTER — Ambulatory Visit (HOSPITAL_BASED_OUTPATIENT_CLINIC_OR_DEPARTMENT_OTHER): Payer: Medicare HMO | Admitting: Internal Medicine

## 2015-10-27 ENCOUNTER — Telehealth: Payer: Self-pay | Admitting: Internal Medicine

## 2015-10-27 ENCOUNTER — Ambulatory Visit (HOSPITAL_COMMUNITY)
Admission: RE | Admit: 2015-10-27 | Discharge: 2015-10-27 | Disposition: A | Discharge status: Home / Self Care | Payer: Medicare HMO | Source: Ambulatory Visit | Attending: Internal Medicine | Admitting: Internal Medicine

## 2015-10-27 ENCOUNTER — Ambulatory Visit (HOSPITAL_BASED_OUTPATIENT_CLINIC_OR_DEPARTMENT_OTHER): Payer: Medicare HMO

## 2015-10-27 ENCOUNTER — Other Ambulatory Visit (HOSPITAL_BASED_OUTPATIENT_CLINIC_OR_DEPARTMENT_OTHER): Payer: Medicare HMO

## 2015-10-27 ENCOUNTER — Encounter: Payer: Self-pay | Admitting: Internal Medicine

## 2015-10-27 VITALS — BP 156/86 | HR 116 | Temp 97.7°F | Resp 18 | Ht 66.0 in | Wt 162.8 lb

## 2015-10-27 DIAGNOSIS — C3411 Malignant neoplasm of upper lobe, right bronchus or lung: Secondary | ICD-10-CM

## 2015-10-27 DIAGNOSIS — Z5111 Encounter for antineoplastic chemotherapy: Secondary | ICD-10-CM

## 2015-10-27 DIAGNOSIS — M25552 Pain in left hip: Secondary | ICD-10-CM | POA: Diagnosis not present

## 2015-10-27 HISTORY — DX: Pain in left hip: M25.552

## 2015-10-27 LAB — CBC WITH DIFFERENTIAL/PLATELET
BASO%: 0 % (ref 0.0–2.0)
Basophils Absolute: 0 10*3/uL (ref 0.0–0.1)
EOS%: 0 % (ref 0.0–7.0)
Eosinophils Absolute: 0 10*3/uL (ref 0.0–0.5)
HCT: 39.6 % (ref 38.4–49.9)
HGB: 13.7 g/dL (ref 13.0–17.1)
LYMPH%: 12.9 % — AB (ref 14.0–49.0)
MCH: 30.7 pg (ref 27.2–33.4)
MCHC: 34.6 g/dL (ref 32.0–36.0)
MCV: 88.8 fL (ref 79.3–98.0)
MONO#: 0.2 10*3/uL (ref 0.1–0.9)
MONO%: 4.8 % (ref 0.0–14.0)
NEUT%: 82.3 % — ABNORMAL HIGH (ref 39.0–75.0)
NEUTROS ABS: 3.1 10*3/uL (ref 1.5–6.5)
PLATELETS: 181 10*3/uL (ref 140–400)
RBC: 4.46 10*6/uL (ref 4.20–5.82)
RDW: 14.3 % (ref 11.0–14.6)
WBC: 3.7 10*3/uL — AB (ref 4.0–10.3)
lymph#: 0.5 10*3/uL — ABNORMAL LOW (ref 0.9–3.3)

## 2015-10-27 LAB — COMPREHENSIVE METABOLIC PANEL
ALT: 16 U/L (ref 0–55)
AST: 13 U/L (ref 5–34)
Albumin: 3.8 g/dL (ref 3.5–5.0)
Alkaline Phosphatase: 49 U/L (ref 40–150)
Anion Gap: 9 mEq/L (ref 3–11)
BILIRUBIN TOTAL: 0.47 mg/dL (ref 0.20–1.20)
BUN: 15.2 mg/dL (ref 7.0–26.0)
CHLORIDE: 108 meq/L (ref 98–109)
CO2: 23 meq/L (ref 22–29)
CREATININE: 1.1 mg/dL (ref 0.7–1.3)
Calcium: 9.9 mg/dL (ref 8.4–10.4)
EGFR: 77 mL/min/{1.73_m2} — ABNORMAL LOW (ref >90)
GLUCOSE: 191 mg/dL — AB (ref 70–140)
Potassium: 3.6 mEq/L (ref 3.5–5.1)
SODIUM: 139 meq/L (ref 136–145)
TOTAL PROTEIN: 7 g/dL (ref 6.4–8.3)

## 2015-10-27 MED ORDER — SODIUM CHLORIDE 0.9 % IV SOLN
10.0000 mg | Freq: Once | INTRAVENOUS | Status: AC
  Administered 2015-10-27: 10 mg via INTRAVENOUS
  Filled 2015-10-27: qty 1

## 2015-10-27 MED ORDER — SODIUM CHLORIDE 0.9 % IV SOLN
Freq: Once | INTRAVENOUS | Status: AC
  Administered 2015-10-27: 11:00:00 via INTRAVENOUS

## 2015-10-27 MED ORDER — PALONOSETRON HCL INJECTION 0.25 MG/5ML
0.2500 mg | Freq: Once | INTRAVENOUS | Status: AC
  Administered 2015-10-27: 0.25 mg via INTRAVENOUS

## 2015-10-27 MED ORDER — SODIUM CHLORIDE 0.9 % IV SOLN
500.0000 mg/m2 | Freq: Once | INTRAVENOUS | Status: AC
  Administered 2015-10-27: 950 mg via INTRAVENOUS
  Filled 2015-10-27: qty 38

## 2015-10-27 MED ORDER — PALONOSETRON HCL INJECTION 0.25 MG/5ML
INTRAVENOUS | Status: AC
  Filled 2015-10-27: qty 5

## 2015-10-27 MED ORDER — CARBOPLATIN CHEMO INJECTION 600 MG/60ML
439.0000 mg | Freq: Once | INTRAVENOUS | Status: AC
  Administered 2015-10-27: 440 mg via INTRAVENOUS
  Filled 2015-10-27: qty 44

## 2015-10-27 NOTE — Progress Notes (Signed)
[place] Telephone:([phone]) [phone]   [phone]) [phone]  OFFICE PROGRESS NOTE  [name], MD [place] [address]  DIAGNOSIS: Stage IIIA (T1b, N2, M0) non-small cell lung cancer, adenocarcinoma presented with right right upper lobe nodule in addition to mediastinal lymphadenopathy diagnosed in November 2016.  PRIOR THERAPY: Concurrent chemoradiation with weekly carboplatin for AUC of 2 and paclitaxel 45 MG/M2. Status post 5 cycles. Last dose was given 07/18/2015 with partial response. [name]  CURRENT THERAPY: Consolidation chemotherapy with carboplatin for AUC of 5 and Alimta 500 MG/M2 every 3 weeks. First dose 10/06/2015.  INTERVAL HISTORY: Alan [name] 75 y.o. male returns to the clinic today for follow-up visit accompanied by his wife and daughter. The patient related the first cycle of his chemotherapy with carboplatin and Alimta fairly well. He complains today of left hip pain started few days ago. Yesterday he took tramadol and Mobic with improvement of his pain. He denied having any significant nausea or vomiting. He has no fever or chills. He denied having any significant chest pain, shortness of breath, cough or hemoptysis. He has no significant weight loss or night sweats. He is here today to start cycle #2 of his systemic chemotherapy.  MEDICAL HISTORY: Past Medical History  Diagnosis Date  . Sleep apnea     does not use cpap  . Unspecified essential hypertension   . Hypercholesteremia   . GERD (gastroesophageal reflux disease)   . Diverticulosis of colon (without mention of hemorrhage)   . Irritable bowel syndrome   . Benign neoplasm of colon     colon polyps  . Benign hypertrophy of prostate   . Erectile dysfunction   . Restless legs syndrome (RLS)   . Anxiety state, unspecified   . Onychomycosis   . Constipation, chronic   . Hypothyroidism   . Cancer [facility])     prostate cancer  . Glaucoma   . Radiation 06/20/15-08/02/15   right upper lung region 60 gray  . Encounter for antineoplastic chemotherapy 09/29/2015    ALLERGIES:  is allergic to codeine.  MEDICATIONS:  Current Outpatient Prescriptions  Medication Sig Dispense Refill  . amLODipine (NORVASC) 5 MG tablet Take 1 tablet (5 mg total) by mouth every morning. 90 tablet 1  . aspirin EC 81 MG tablet Take 81 mg by mouth daily.     . brimonidine-timolol (COMBIGAN) 0.2-0.5 % ophthalmic solution Place 1 drop into both eyes every 12 (twelve) hours.    . chlorproMAZINE (THORAZINE) 25 MG tablet Take 1 tablet (25 mg total) by mouth 3 (three) times daily as needed for hiccoughs. 30 tablet 0  . dexamethasone (DECADRON) 4 MG tablet 4 mg by mouth twice a day the day before, day of and day after the chemotherapy every 3 weeks. 40 tablet 0  . finasteride ([name]) 5 MG tablet Take 5 mg by mouth daily.    . fluticasone (FLONASE) 50 MCG/ACT nasal spray Place 2 sprays into both nostrils daily. (Patient taking differently: Place 2 sprays into both nostrils daily as needed for allergies or rhinitis. ) 48 g 3  . folic acid (FOLVITE) 1 MG tablet Take 1 tablet (1 mg total) by mouth daily. 30 tablet 2  . lactulose (CHRONULAC) 10 GM/15ML solution Take 10 g by mouth 2 (two) times daily as needed (constipation).     [name] levothyroxine (SYNTHROID, LEVOTHROID) 100 MCG tablet Take 1 tablet (100 mcg total) by mouth daily. 45 tablet 1  . Linaclotide Rolan Lipa)  290 MCG CAPS capsule Take 1 capsule (290 mcg total) by mouth daily. 30 capsule 3  . meloxicam (MOBIC) 7.5 MG tablet Take 1 tablet by mouth daily.    [name] omeprazole (PRILOSEC) 20 MG capsule TAKE ONE CAPSULE BY MOUTH IN THE MORNING 90 capsule 0  . polyethylene glycol (MIRALAX / GLYCOLAX) packet Take 17 g by mouth daily as needed (constipation). Reported on 06/21/2015    . prochlorperazine (COMPAZINE) 10 MG tablet Take 1 tablet (10 mg total) by mouth every 6 (six) hours as needed for nausea or vomiting. 30 tablet 0  . simvastatin (ZOCOR) 20 MG  tablet TAKE ONE TABLET BY MOUTH AT BEDTIME 90 tablet 0  . sucralfate (CARAFATE) 1 g tablet Take 1 tablet (1 g total) by mouth 4 (four) times daily -  with meals and at bedtime. Mix in 2Tbs water, swish and swallow 60 tablet 0  . tamsulosin (FLOMAX) 0.4 MG CAPS capsule Take 1 capsule (0.4 mg total) by mouth every evening. (Patient taking differently: Take 0.8 mg by mouth at bedtime. ) 90 capsule 3  . traMADol (ULTRAM) 50 MG tablet Take 1 tablet (50 mg total) by mouth every 6 (six) hours as needed for moderate pain. 20 tablet 0  . sildenafil (VIAGRA) 100 MG tablet Take 1 tablet (100 mg total) by mouth as directed. 10 tablet 0   No current facility-administered medications for this visit.    SURGICAL HISTORY:  Past Surgical History  Procedure Laterality Date  . None    . Colonoscopy    . Laparoscopic appendectomy N/A 01/27/2013    Procedure: APPENDECTOMY LAPAROSCOPIC;  Surgeon: Edward Jolly, MD;  Location: [facility] ORS;  Service: General;  Laterality: N/A;  . Appendectomy  12/2012  . Thyroidectomy    . Tonsillectomy    . Cardiac catheterization  ? 2008  . Video bronchoscopy N/A 05/31/2015    Procedure: VIDEO BRONCHOSCOPY;  Surgeon: Gaye Pollack, MD;  Location: [facility] OR;  Service: Thoracic;  Laterality: N/A;  . Mediastinoscopy N/A 05/31/2015    Procedure: MEDIASTINOSCOPY;  Surgeon: Gaye Pollack, MD;  Location: [facility] OR;  Service: Thoracic;  Laterality: N/A;    REVIEW OF SYSTEMS:  Constitutional: negative Eyes: negative Ears, nose, mouth, throat, and face: negative Respiratory: negative Cardiovascular: negative Gastrointestinal: negative Genitourinary:negative Integument/breast: negative Hematologic/lymphatic: negative Musculoskeletal:positive for arthralgias and bone pain Neurological: negative Behavioral/Psych: negative Endocrine: negative Allergic/Immunologic: negative   PHYSICAL EXAMINATION: General appearance: alert, cooperative, fatigued and no distress Head: Normocephalic,  without obvious abnormality, atraumatic Neck: no adenopathy, no JVD, supple, symmetrical, trachea midline and thyroid not enlarged, symmetric, no tenderness/mass/nodules Lymph nodes: Cervical, supraclavicular, and axillary nodes normal. Resp: clear to auscultation bilaterally Back: symmetric, no curvature. ROM normal. No CVA tenderness. Cardio: regular rate and rhythm, S1, S2 normal, no murmur, click, rub or gallop GI: soft, non-tender; bowel sounds normal; no masses,  no organomegaly Extremities: extremities normal, atraumatic, no cyanosis or edema Neurologic: Alert and oriented X 3, normal strength and tone. Normal symmetric reflexes. Normal coordination and gait  ECOG PERFORMANCE STATUS: 1 - Symptomatic but completely ambulatory  Blood pressure 156/86, pulse 116, temperature 97.7 F (36.5 C), temperature source Oral, resp. rate 18, height '5\' 6"'  (1.676 m), weight 162 lb 12.8 oz (73.846 kg), SpO2 100 %.  LABORATORY DATA: Lab Results  Component Value Date   WBC 3.7* 10/27/2015   HGB 13.7 10/27/2015   HCT 39.6 10/27/2015   MCV 88.8 10/27/2015   PLT 181 10/27/2015      Chemistry  Component Value Date/Time   NA 141 10/20/2015 1009   NA 142 05/31/2015 0645   K 3.7 10/20/2015 1009   K 3.7 05/31/2015 0645   CL 110 05/31/2015 0645   CO2 24 10/20/2015 1009   CO2 25 05/31/2015 0645   BUN 15.6 10/20/2015 1009   BUN 8 05/31/2015 0645   CREATININE 1.1 10/20/2015 1009   CREATININE 1.03 05/31/2015 0645      Component Value Date/Time   CALCIUM 9.6 10/20/2015 1009   CALCIUM 9.1 05/31/2015 0645   ALKPHOS 52 10/20/2015 1009   ALKPHOS 42 05/31/2015 0645   AST 17 10/20/2015 1009   AST 15 05/31/2015 0645   ALT 17 10/20/2015 1009   ALT 17 05/31/2015 0645   BILITOT 0.37 10/20/2015 1009   BILITOT 0.4 05/31/2015 0645       RADIOGRAPHIC STUDIES: No results found.  ASSESSMENT AND PLAN: This is a very pleasant 75 years old African-American male recently diagnosed with a stage IIIA  non-small cell lung cancer, adenocarcinoma with negative EGFR mutation, negative ALK gene translocation and negative ROS 1. He completed a course of concurrent chemoradiation with weekly carboplatin and paclitaxel status post 5 cycles. The patient tolerated his treatment fairly well. The recent CT scan of the chest showed partial response in the primary left upper lobe neoplasm with no residual thoracic adenopathy. He was seen by Dr. Cyndia Bent for consideration of surgical resection but the patient became apprehensive about surgery after discussion of the risk and benefits of the procedure. He is currently undergoing consolidation chemotherapy with carboplatin and Alimta status post 1 cycle. He tolerated the first cycle of his treatment fairly well. I recommended for the patient to proceed with cycle #2 today as a scheduled. For the left hip pain, I will order x-ray of the left hip to rule out any abnormality. He was also advised to continue with his current pain medication. He would come back for follow-up visit in 3 weeks with the start of cycle #3. The patient was advised to call immediately if he has any concerning symptoms in the interval. The patient voices understanding of current disease status and treatment options and is in agreement with the current care plan.  All questions were answered. The patient knows to call the clinic with any problems, questions or concerns. We can certainly see the patient much sooner if necessary.  Disclaimer: This note was dictated with voice recognition software. Similar sounding words can inadvertently be transcribed and may not be corrected upon review.

## 2015-10-27 NOTE — Telephone Encounter (Signed)
Pt will p/u in tx room

## 2015-10-27 NOTE — Patient Instructions (Signed)
[facility] Discharge Instructions for Patients Receiving Chemotherapy  Today you received the following chemotherapy agents: Alimta and Carboplatin.  To help prevent nausea and vomiting after your treatment, we encourage you to take your nausea medication as directed.   If you develop nausea and vomiting that is not controlled by your nausea medication, call the clinic.   BELOW ARE SYMPTOMS THAT SHOULD BE REPORTED IMMEDIATELY:  *FEVER GREATER THAN 100.5 F  *CHILLS WITH OR WITHOUT FEVER  NAUSEA AND VOMITING THAT IS NOT CONTROLLED WITH YOUR NAUSEA MEDICATION  *UNUSUAL SHORTNESS OF BREATH  *UNUSUAL BRUISING OR BLEEDING  TENDERNESS IN MOUTH AND THROAT WITH OR WITHOUT PRESENCE OF ULCERS  *URINARY PROBLEMS  *BOWEL PROBLEMS  UNUSUAL RASH Items with * indicate a potential emergency and should be followed up as soon as possible.  Feel free to call the clinic you have any questions or concerns. The clinic phone number is [phone].  Please show the CHEMO ALERT CARD at check-in to the Emergency Department and triage nurse.   

## 2015-10-28 ENCOUNTER — Telehealth: Payer: Self-pay | Admitting: Internal Medicine

## 2015-10-28 ENCOUNTER — Encounter: Payer: Self-pay | Admitting: Internal Medicine

## 2015-10-28 MED ORDER — LEVOTHYROXINE SODIUM 100 MCG PO TABS: 100.0000 ug | ORAL_TABLET | Freq: Every day | ORAL | Status: DC

## 2015-10-28 NOTE — Telephone Encounter (Signed)
Patient nee a new prescription of medication  levothyroxine (SYNTHROID, LEVOTHROID) 100 MCG tablet,  [facility] [place] [address] [place] [phone] (Phone) [phone] (Fax)

## 2015-10-28 NOTE — Telephone Encounter (Signed)
Refill sent to his pharmacy.

## 2015-10-28 NOTE — Progress Notes (Signed)
left in pod.left for dr. [name] to sign

## 2015-10-31 ENCOUNTER — Telehealth: Payer: Self-pay | Admitting: Pulmonary Disease

## 2015-10-31 ENCOUNTER — Encounter: Payer: Self-pay | Admitting: Internal Medicine

## 2015-10-31 MED ORDER — MELOXICAM 7.5 MG PO TABS: 7.5000 mg | ORAL_TABLET | Freq: Every day | ORAL | Status: DC

## 2015-10-31 MED ORDER — LEVOFLOXACIN 500 MG PO TABS: 500.0000 mg | ORAL_TABLET | Freq: Every day | ORAL | Status: DC

## 2015-10-31 NOTE — Telephone Encounter (Signed)
Pt aware. Nothing further needed 

## 2015-10-31 NOTE — Telephone Encounter (Signed)
LEVOFLOXACIN 500 mg/tab 1x/d for 5 days. Tell pt to call office if not better. WOF side effects such as tenditintis, etc -- if it happens, call asap.   AD

## 2015-10-31 NOTE — Progress Notes (Signed)
-  left for dr. Mckinley Jewel to sign. faxed to [phone]

## 2015-10-31 NOTE — Telephone Encounter (Signed)
Sending message to DOD as SN is unavailable this afternoon.  AD please advise on recs.  Thanks!

## 2015-10-31 NOTE — Telephone Encounter (Signed)
PLS refill levaquin.  Pls tell pt to call in next 1-2 days if he is not better -- will need blood work cbc, bmp and cxr.   thanks

## 2015-10-31 NOTE — Telephone Encounter (Signed)
Called spoke with patient who c/o some prod cough with white mucus, a little chest tightness that he attributes to the pollen x3 days.  Denies any wheezing, dyspnea, f/c/s.  Pt is requesting a refill on his Levaquin for these symptoms.  Pt is also requesting a refill on his Meloxicam - refill sent  Dr Lenna Gilford, please advise on the Levaquin. Last ov 4.20.17  Allergies  Allergen Reactions  . Codeine Other (See Comments)    : hallucinations,faint,dizziness   Current Outpatient Prescriptions on File Prior to Visit  Medication Sig Dispense Refill  . amLODipine (NORVASC) 5 MG tablet Take 1 tablet (5 mg total) by mouth every morning. 90 tablet 1  . aspirin EC 81 MG tablet Take 81 mg by mouth daily.     . brimonidine-timolol (COMBIGAN) 0.2-0.5 % ophthalmic solution Place 1 drop into both eyes every 12 (twelve) hours.    . chlorproMAZINE (THORAZINE) 25 MG tablet Take 1 tablet (25 mg total) by mouth 3 (three) times daily as needed for hiccoughs. 30 tablet 0  . dexamethasone (DECADRON) 4 MG tablet 4 mg by mouth twice a day the day before, day of and day after the chemotherapy every 3 weeks. 40 tablet 0  . finasteride ([name]) 5 MG tablet Take 5 mg by mouth daily.    . fluticasone (FLONASE) 50 MCG/ACT nasal spray Place 2 sprays into both nostrils daily. (Patient taking differently: Place 2 sprays into both nostrils daily as needed for allergies or rhinitis. ) 48 g 3  . folic acid (FOLVITE) 1 MG tablet Take 1 tablet (1 mg total) by mouth daily. 30 tablet 2  . lactulose (CHRONULAC) 10 GM/15ML solution Take 10 g by mouth 2 (two) times daily as needed (constipation).     [name] levothyroxine (SYNTHROID, LEVOTHROID) 100 MCG tablet Take 1 tablet (100 mcg total) by mouth daily. 45 tablet 1  . Linaclotide (LINZESS) 290 MCG CAPS capsule Take 1 capsule (290 mcg total) by mouth daily. 30 capsule 3  . meloxicam (MOBIC) 7.5 MG tablet Take 1 tablet by mouth daily.    [name] omeprazole (PRILOSEC) 20 MG capsule TAKE ONE CAPSULE  BY MOUTH IN THE MORNING 90 capsule 0  . polyethylene glycol (MIRALAX / GLYCOLAX) packet Take 17 g by mouth daily as needed (constipation). Reported on 06/21/2015    . prochlorperazine (COMPAZINE) 10 MG tablet Take 1 tablet (10 mg total) by mouth every 6 (six) hours as needed for nausea or vomiting. 30 tablet 0  . sildenafil (VIAGRA) 100 MG tablet Take 1 tablet (100 mg total) by mouth as directed. 10 tablet 0  . simvastatin (ZOCOR) 20 MG tablet TAKE ONE TABLET BY MOUTH AT BEDTIME 90 tablet 0  . sucralfate (CARAFATE) 1 g tablet Take 1 tablet (1 g total) by mouth 4 (four) times daily -  with meals and at bedtime. Mix in 2Tbs water, swish and swallow 60 tablet 0  . tamsulosin (FLOMAX) 0.4 MG CAPS capsule Take 1 capsule (0.4 mg total) by mouth every evening. (Patient taking differently: Take 0.8 mg by mouth at bedtime. ) 90 capsule 3  . traMADol (ULTRAM) 50 MG tablet Take 1 tablet (50 mg total) by mouth every 6 (six) hours as needed for moderate pain. 20 tablet 0   No current facility-administered medications on file prior to visit.

## 2015-10-31 NOTE — Telephone Encounter (Signed)
Pt aware of rec's per Dr Corrie Dandy.  Aware that Levaquin will be refilled to pharmacy  Dr Corrie Dandy please advise on Levaquin '500mg'$  instructions as we have not prescribed this for the patient. Thanks.

## 2015-10-31 NOTE — Progress Notes (Signed)
Sent to medical records and cpy at front for pick up with ms wilma.

## 2015-11-02 ENCOUNTER — Telehealth: Payer: Self-pay | Admitting: Internal Medicine

## 2015-11-02 MED ORDER — LEVOTHYROXINE SODIUM 100 MCG PO TABS
100.0000 ug | ORAL_TABLET | Freq: Every day | ORAL | Status: DC
Start: 2015-11-02 — End: 2016-02-22

## 2015-11-02 NOTE — Telephone Encounter (Signed)
Pt needs the levothyroxine called back in [facility] is telling him they do not have it

## 2015-11-03 ENCOUNTER — Other Ambulatory Visit (HOSPITAL_BASED_OUTPATIENT_CLINIC_OR_DEPARTMENT_OTHER): Payer: Medicare HMO

## 2015-11-03 DIAGNOSIS — C3411 Malignant neoplasm of upper lobe, right bronchus or lung: Secondary | ICD-10-CM | POA: Diagnosis not present

## 2015-11-03 LAB — CBC WITH DIFFERENTIAL/PLATELET
BASO%: 0.2 % (ref 0.0–2.0)
BASOS ABS: 0 10*3/uL (ref 0.0–0.1)
EOS ABS: 0.1 10*3/uL (ref 0.0–0.5)
EOS%: 3.4 % (ref 0.0–7.0)
HEMATOCRIT: 39.9 % (ref 38.4–49.9)
HGB: 13 g/dL (ref 13.0–17.1)
LYMPH%: 40.5 % (ref 14.0–49.0)
MCH: 30 pg (ref 27.2–33.4)
MCHC: 32.6 g/dL (ref 32.0–36.0)
MCV: 92 fL (ref 79.3–98.0)
MONO#: 0.1 10*3/uL (ref 0.1–0.9)
MONO%: 8.7 % (ref 0.0–14.0)
NEUT#: 0.8 10*3/uL — ABNORMAL LOW (ref 1.5–6.5)
NEUT%: 47.2 % (ref 39.0–75.0)
Platelets: 210 10*3/uL (ref 140–400)
RBC: 4.33 10*6/uL (ref 4.20–5.82)
RDW: 14.7 % — ABNORMAL HIGH (ref 11.0–14.6)
WBC: 1.6 10*3/uL — ABNORMAL LOW (ref 4.0–10.3)
lymph#: 0.7 10*3/uL — ABNORMAL LOW (ref 0.9–3.3)

## 2015-11-03 LAB — COMPREHENSIVE METABOLIC PANEL
ALBUMIN: 3.5 g/dL (ref 3.5–5.0)
ALK PHOS: 43 U/L (ref 40–150)
ALT: 13 U/L (ref 0–55)
AST: 13 U/L (ref 5–34)
Anion Gap: 7 mEq/L (ref 3–11)
BUN: 16 mg/dL (ref 7.0–26.0)
CALCIUM: 9.5 mg/dL (ref 8.4–10.4)
CHLORIDE: 106 meq/L (ref 98–109)
CO2: 26 mEq/L (ref 22–29)
Creatinine: 1.2 mg/dL (ref 0.7–1.3)
EGFR: 72 mL/min/{1.73_m2} — AB (ref >90)
Glucose: 105 mg/dl (ref 70–140)
POTASSIUM: 4.3 meq/L (ref 3.5–5.1)
SODIUM: 139 meq/L (ref 136–145)
Total Bilirubin: 0.41 mg/dL (ref 0.20–1.20)
Total Protein: 6.2 g/dL — ABNORMAL LOW (ref 6.4–8.3)

## 2015-11-06 DIAGNOSIS — M25559 Pain in unspecified hip: Secondary | ICD-10-CM

## 2015-11-10 ENCOUNTER — Other Ambulatory Visit (HOSPITAL_BASED_OUTPATIENT_CLINIC_OR_DEPARTMENT_OTHER): Payer: Medicare HMO

## 2015-11-10 DIAGNOSIS — C3411 Malignant neoplasm of upper lobe, right bronchus or lung: Secondary | ICD-10-CM

## 2015-11-10 LAB — CBC WITH DIFFERENTIAL/PLATELET
BASO%: 0 % (ref 0.0–2.0)
BASOS ABS: 0 10*3/uL (ref 0.0–0.1)
EOS%: 1.5 % (ref 0.0–7.0)
Eosinophils Absolute: 0 10*3/uL (ref 0.0–0.5)
HEMATOCRIT: 37.4 % — AB (ref 38.4–49.9)
HEMOGLOBIN: 12.9 g/dL — AB (ref 13.0–17.1)
LYMPH%: 36.6 % (ref 14.0–49.0)
MCH: 31.2 pg (ref 27.2–33.4)
MCHC: 34.5 g/dL (ref 32.0–36.0)
MCV: 90.3 fL (ref 79.3–98.0)
MONO#: 0.4 10*3/uL (ref 0.1–0.9)
MONO%: 18.5 % — AB (ref 0.0–14.0)
NEUT#: 0.9 10*3/uL — ABNORMAL LOW (ref 1.5–6.5)
NEUT%: 43.4 % (ref 39.0–75.0)
Platelets: 173 10*3/uL (ref 140–400)
RBC: 4.14 10*6/uL — ABNORMAL LOW (ref 4.20–5.82)
RDW: 14.8 % — ABNORMAL HIGH (ref 11.0–14.6)
WBC: 2.1 10*3/uL — ABNORMAL LOW (ref 4.0–10.3)
lymph#: 0.8 10*3/uL — ABNORMAL LOW (ref 0.9–3.3)

## 2015-11-10 LAB — COMPREHENSIVE METABOLIC PANEL
ALBUMIN: 3.6 g/dL (ref 3.5–5.0)
ALK PHOS: 44 U/L (ref 40–150)
ALT: 16 U/L (ref 0–55)
AST: 14 U/L (ref 5–34)
Anion Gap: 7 mEq/L (ref 3–11)
BUN: 10.9 mg/dL (ref 7.0–26.0)
CALCIUM: 9.5 mg/dL (ref 8.4–10.4)
CHLORIDE: 109 meq/L (ref 98–109)
CO2: 26 mEq/L (ref 22–29)
Creatinine: 1.1 mg/dL (ref 0.7–1.3)
EGFR: 75 mL/min/{1.73_m2} — ABNORMAL LOW (ref >90)
Glucose: 99 mg/dl (ref 70–140)
POTASSIUM: 3.9 meq/L (ref 3.5–5.1)
SODIUM: 141 meq/L (ref 136–145)
Total Bilirubin: <0.3 mg/dL (ref 0.20–1.20)
Total Protein: 6.4 g/dL (ref 6.4–8.3)

## 2015-11-13 DIAGNOSIS — M25559 Pain in unspecified hip: Secondary | ICD-10-CM

## 2015-11-13 NOTE — Congregational Nurse Program (Signed)
Congregational Nurse Program Note  Date of Encounter: 11/13/2015  Past Medical History: Past Medical History  Diagnosis Date  . Sleep apnea     does not use cpap  . Unspecified essential hypertension   . Hypercholesteremia   . GERD (gastroesophageal reflux disease)   . Diverticulosis of colon (without mention of hemorrhage)   . Irritable bowel syndrome   . Benign neoplasm of colon     colon polyps  . Benign hypertrophy of prostate   . Erectile dysfunction   . Restless legs syndrome (RLS)   . Anxiety state, unspecified   . Onychomycosis   . Constipation, chronic   . Hypothyroidism   . Cancer [facility])     prostate cancer  . Glaucoma   . Radiation 06/20/15-08/02/15    right upper lung region 60 gray  . Encounter for antineoplastic chemotherapy 09/29/2015  . Left hip pain 10/27/2015    Encounter Details:     CNP Questionnaire - 11/13/15 2327    Patient Demographics   Is this a new or existing patient? Existing   Patient is considered a/an Not Applicable   Race African-American/Black   Patient Assistance   Location of Patient Assistance [employer]   Patient's financial/insurance status Low Income;Medicare   Uninsured Patient No   Patient referred to apply for the following financial assistance Not Applicable   Food insecurities addressed Not Applicable   Transportation assistance No   Assistance securing medications No   Educational health offerings Cancer;Safety;Health literacy   Encounter Details   Primary purpose of visit Education/Health Concerns;Safety;Chronic Illness/Condition Visit;Spiritual Care/Support Visit   Was an Emergency Department visit averted? No   Does patient have a medical provider? Yes   Patient referred to Not Applicable   Was a mental health screening completed? (GAINS tool) No   Does patient have dental issues? No   Does patient have vision issues? Yes   Was a vision referral made? No   Does your patient have an abnormal blood pressure  today? No   Since previous encounter, have you referred patient for abnormal blood pressure that resulted in a new diagnosis or medication change? No   Does your patient have an abnormal blood glucose today? No   Since previous encounter, have you referred patient for abnormal blood glucose that resulted in a new diagnosis or medication change? No   Was there a life-saving intervention made? No   For Abstraction Use Only   Was the patient insured? Yes     Client seen at [facility].  Client has been on chemo and will get last treatment this week.  He has been experiencing level 3-8 pain in lower back, hip and down left leg.  He has had xrays with no problems noted.  He is scheduled to have an MRI this week.  He has been on pain medication which reduces the pain to about 3.  He has fallen after missing a chair when trying to sit.  He has had some memory issues.  His cancer dr. Has told him that many times symptoms are due to the chemo and could resolve with completion of chemo. Client using a cane.  He has been using a cart when shopping for support.  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  Will follow up after MRI

## 2015-11-15 NOTE — Congregational Nurse Program (Signed)
Congregational Nurse Program Note  Date of Encounter: 09/28/2015  Past Medical History: Past Medical History  Diagnosis Date  . Sleep apnea     does not use cpap  . Unspecified essential hypertension   . Hypercholesteremia   . GERD (gastroesophageal reflux disease)   . Diverticulosis of colon (without mention of hemorrhage)   . Irritable bowel syndrome   . Benign neoplasm of colon     colon polyps  . Benign hypertrophy of prostate   . Erectile dysfunction   . Restless legs syndrome (RLS)   . Anxiety state, unspecified   . Onychomycosis   . Constipation, chronic   . Hypothyroidism   . Cancer [facility])     prostate cancer  . Glaucoma   . Radiation 06/20/15-08/02/15    right upper lung region 60 gray  . Encounter for antineoplastic chemotherapy 09/29/2015  . Left hip pain 10/27/2015    Encounter Details:  Having problems with swallowing, burning, using antiacid, says side effect of radiation.  Attitude good.  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  Allowed to vent and encouraged soft foods, ensure, milk shakes, anything with calories.  Praying for client.  Followup each week.

## 2015-11-17 ENCOUNTER — Telehealth: Payer: Self-pay | Admitting: Medical Oncology

## 2015-11-17 ENCOUNTER — Telehealth: Payer: Self-pay | Admitting: Pulmonary Disease

## 2015-11-17 ENCOUNTER — Ambulatory Visit (HOSPITAL_BASED_OUTPATIENT_CLINIC_OR_DEPARTMENT_OTHER): Payer: Medicare HMO | Admitting: Internal Medicine

## 2015-11-17 ENCOUNTER — Ambulatory Visit (HOSPITAL_BASED_OUTPATIENT_CLINIC_OR_DEPARTMENT_OTHER): Payer: Medicare HMO

## 2015-11-17 ENCOUNTER — Encounter: Payer: Self-pay | Admitting: Internal Medicine

## 2015-11-17 ENCOUNTER — Telehealth: Payer: Self-pay | Admitting: Internal Medicine

## 2015-11-17 ENCOUNTER — Other Ambulatory Visit (HOSPITAL_BASED_OUTPATIENT_CLINIC_OR_DEPARTMENT_OTHER): Payer: Medicare HMO

## 2015-11-17 VITALS — BP 145/81 | HR 109 | Temp 97.7°F | Resp 18 | Ht 66.0 in | Wt 161.1 lb

## 2015-11-17 DIAGNOSIS — Z5111 Encounter for antineoplastic chemotherapy: Secondary | ICD-10-CM

## 2015-11-17 DIAGNOSIS — M25552 Pain in left hip: Secondary | ICD-10-CM

## 2015-11-17 DIAGNOSIS — C3411 Malignant neoplasm of upper lobe, right bronchus or lung: Secondary | ICD-10-CM | POA: Diagnosis not present

## 2015-11-17 LAB — CBC WITH DIFFERENTIAL/PLATELET
BASO%: 0.2 % (ref 0.0–2.0)
Basophils Absolute: 0 10*3/uL (ref 0.0–0.1)
EOS%: 0 % (ref 0.0–7.0)
Eosinophils Absolute: 0 10*3/uL (ref 0.0–0.5)
HCT: 40.5 % (ref 38.4–49.9)
HEMOGLOBIN: 13.4 g/dL (ref 13.0–17.1)
LYMPH%: 14 % (ref 14.0–49.0)
MCH: 30.3 pg (ref 27.2–33.4)
MCHC: 33 g/dL (ref 32.0–36.0)
MCV: 91.7 fL (ref 79.3–98.0)
MONO#: 0.3 10*3/uL (ref 0.1–0.9)
MONO%: 8.2 % (ref 0.0–14.0)
NEUT%: 77.6 % — ABNORMAL HIGH (ref 39.0–75.0)
NEUTROS ABS: 2.7 10*3/uL (ref 1.5–6.5)
Platelets: 198 10*3/uL (ref 140–400)
RBC: 4.42 10*6/uL (ref 4.20–5.82)
RDW: 16.5 % — AB (ref 11.0–14.6)
WBC: 3.4 10*3/uL — AB (ref 4.0–10.3)
lymph#: 0.5 10*3/uL — ABNORMAL LOW (ref 0.9–3.3)

## 2015-11-17 LAB — COMPREHENSIVE METABOLIC PANEL
ALBUMIN: 3.9 g/dL (ref 3.5–5.0)
ALK PHOS: 52 U/L (ref 40–150)
ALT: 15 U/L (ref 0–55)
AST: 12 U/L (ref 5–34)
Anion Gap: 9 mEq/L (ref 3–11)
BILIRUBIN TOTAL: 0.33 mg/dL (ref 0.20–1.20)
BUN: 16.4 mg/dL (ref 7.0–26.0)
CO2: 21 mEq/L — ABNORMAL LOW (ref 22–29)
Calcium: 9.7 mg/dL (ref 8.4–10.4)
Chloride: 109 mEq/L (ref 98–109)
Creatinine: 1 mg/dL (ref 0.7–1.3)
EGFR: 85 mL/min/{1.73_m2} — ABNORMAL LOW (ref >90)
GLUCOSE: 159 mg/dL — AB (ref 70–140)
POTASSIUM: 3.7 meq/L (ref 3.5–5.1)
SODIUM: 140 meq/L (ref 136–145)
TOTAL PROTEIN: 7.2 g/dL (ref 6.4–8.3)

## 2015-11-17 MED ORDER — FLUTICASONE PROPIONATE 50 MCG/ACT NA SUSP: 2.0000 | Freq: Every day | NASAL | Status: AC | PRN

## 2015-11-17 MED ORDER — SODIUM CHLORIDE 0.9 % IV SOLN
10.0000 mg | Freq: Once | INTRAVENOUS | Status: AC
  Administered 2015-11-17: 10 mg via INTRAVENOUS
  Filled 2015-11-17: qty 1

## 2015-11-17 MED ORDER — PEMETREXED DISODIUM CHEMO INJECTION 500 MG
500.0000 mg/m2 | Freq: Once | INTRAVENOUS | Status: AC
  Administered 2015-11-17: 950 mg via INTRAVENOUS
  Filled 2015-11-17: qty 38

## 2015-11-17 MED ORDER — CYANOCOBALAMIN 1000 MCG/ML IJ SOLN
1000.0000 ug | Freq: Once | INTRAMUSCULAR | Status: AC
  Administered 2015-11-17: 1000 ug via INTRAMUSCULAR

## 2015-11-17 MED ORDER — PALONOSETRON HCL INJECTION 0.25 MG/5ML
0.2500 mg | Freq: Once | INTRAVENOUS | Status: AC
  Administered 2015-11-17: 0.25 mg via INTRAVENOUS

## 2015-11-17 MED ORDER — OMEPRAZOLE 20 MG PO CPDR: 20.0000 mg | DELAYED_RELEASE_CAPSULE | Freq: Every morning | ORAL | Status: DC

## 2015-11-17 MED ORDER — CYANOCOBALAMIN 1000 MCG/ML IJ SOLN
INTRAMUSCULAR | Status: AC
  Filled 2015-11-17: qty 1

## 2015-11-17 MED ORDER — SILDENAFIL CITRATE 100 MG PO TABS: 100.0000 mg | ORAL_TABLET | ORAL | Status: DC

## 2015-11-17 MED ORDER — SODIUM CHLORIDE 0.9 % IV SOLN
470.5000 mg | Freq: Once | INTRAVENOUS | Status: AC
  Administered 2015-11-17: 470 mg via INTRAVENOUS
  Filled 2015-11-17: qty 47

## 2015-11-17 MED ORDER — SIMVASTATIN 20 MG PO TABS: 20.0000 mg | ORAL_TABLET | Freq: Every day | ORAL | Status: DC

## 2015-11-17 MED ORDER — MELOXICAM 7.5 MG PO TABS: 7.5000 mg | ORAL_TABLET | Freq: Every day | ORAL | Status: DC

## 2015-11-17 MED ORDER — TAMSULOSIN HCL 0.4 MG PO CAPS: 0.8000 mg | ORAL_CAPSULE | Freq: Every day | ORAL | Status: AC

## 2015-11-17 MED ORDER — PALONOSETRON HCL INJECTION 0.25 MG/5ML
INTRAVENOUS | Status: AC
  Filled 2015-11-17: qty 5

## 2015-11-17 MED ORDER — AMLODIPINE BESYLATE 5 MG PO TABS: 5.0000 mg | ORAL_TABLET | Freq: Every morning | ORAL | Status: DC

## 2015-11-17 MED ORDER — SODIUM CHLORIDE 0.9 % IV SOLN
Freq: Once | INTRAVENOUS | Status: AC
  Administered 2015-11-17: 11:00:00 via INTRAVENOUS

## 2015-11-17 NOTE — Progress Notes (Signed)
[facility] [phone]   [phone]  OFFICE PROGRESS NOTE  [name], MD [place] [address]  DIAGNOSIS: Stage IIIA (T1b, N2, M0) non-small cell lung cancer, adenocarcinoma presented with right right upper lobe nodule in addition to mediastinal lymphadenopathy diagnosed in November 2016.  PRIOR THERAPY: Concurrent chemoradiation with weekly carboplatin for AUC of 2 and paclitaxel 45 MG/M2. Status post 5 cycles. Last dose was given 07/18/2015 with partial response. [name]  CURRENT THERAPY: Consolidation chemotherapy with carboplatin for AUC of 5 and Alimta 500 MG/M2 every 3 weeks. First dose 10/06/2015. Status post 2 cycles.  INTERVAL HISTORY: Alan [name] 75 y.o. male returns to the clinic today for follow-up visit accompanied by his wife. The patient related the second cycle of his chemotherapy with carboplatin and Alimta fairly well. He continues to complain of left hip pain with radiation to the left lower extremity consistent with sciatica. It was better when the patient was taking his Decadron premedication. He denied having any significant nausea or vomiting. He has no fever or chills. He denied having any significant chest pain, shortness of breath, cough or hemoptysis. He has no significant weight loss or night sweats. He is here today to start cycle #3 of his systemic chemotherapy.  MEDICAL HISTORY: Past Medical History  Diagnosis Date  . Sleep apnea     does not use cpap  . Unspecified essential hypertension   . Hypercholesteremia   . GERD (gastroesophageal reflux disease)   . Diverticulosis of colon (without mention of hemorrhage)   . Irritable bowel syndrome   . Benign neoplasm of colon     colon polyps  . Benign hypertrophy of prostate   . Erectile dysfunction   . Restless legs syndrome (RLS)   . Anxiety state, unspecified   . Onychomycosis   . Constipation, chronic   . Hypothyroidism   . Cancer [facility])    prostate cancer  . Glaucoma   . Radiation 06/20/15-08/02/15    right upper lung region 60 gray  . Encounter for antineoplastic chemotherapy 09/29/2015  . Left hip pain 10/27/2015    ALLERGIES:  is allergic to codeine.  MEDICATIONS:  Current Outpatient Prescriptions  Medication Sig Dispense Refill  . amLODipine (NORVASC) 5 MG tablet Take 1 tablet (5 mg total) by mouth every morning. 90 tablet 1  . aspirin EC 81 MG tablet Take 81 mg by mouth daily.     . brimonidine-timolol (COMBIGAN) 0.2-0.5 % ophthalmic solution Place 1 drop into both eyes every 12 (twelve) hours.    [name] dexamethasone (DECADRON) 4 MG tablet 4 mg by mouth twice a day the day before, day of and day after the chemotherapy every 3 weeks. 40 tablet 0  . finasteride ([name]) 5 MG tablet Take 5 mg by mouth daily.    . fluticasone (FLONASE) 50 MCG/ACT nasal spray Place 2 sprays into both nostrils daily. (Patient taking differently: Place 2 sprays into both nostrils daily as needed for allergies or rhinitis. ) 48 g 3  . lactulose (CHRONULAC) 10 GM/15ML solution Take 10 g by mouth 2 (two) times daily as needed (constipation).     [name] levothyroxine (SYNTHROID, LEVOTHROID) 100 MCG tablet Take 1 tablet (100 mcg total) by mouth daily. 45 tablet 2  . Linaclotide (LINZESS) 290 MCG CAPS capsule Take 1 capsule (290 mcg total) by mouth daily. 30 capsule 3  . meloxicam (MOBIC) 7.5 MG tablet Take 1 tablet (7.5 mg total) by mouth  daily. 30 tablet 1  . omeprazole (PRILOSEC) 20 MG capsule TAKE ONE CAPSULE BY MOUTH IN THE MORNING 90 capsule 0  . polyethylene glycol (MIRALAX / GLYCOLAX) packet Take 17 g by mouth daily as needed (constipation). Reported on 06/21/2015    . simvastatin (ZOCOR) 20 MG tablet TAKE ONE TABLET BY MOUTH AT BEDTIME 90 tablet 0  . sucralfate (CARAFATE) 1 g tablet Take 1 tablet (1 g total) by mouth 4 (four) times daily -  with meals and at bedtime. Mix in 2Tbs water, swish and swallow 60 tablet 0  . tamsulosin (FLOMAX) 0.4 MG CAPS  capsule Take 1 capsule (0.4 mg total) by mouth every evening. (Patient taking differently: Take 0.8 mg by mouth at bedtime. ) 90 capsule 3  . chlorproMAZINE (THORAZINE) 25 MG tablet Take 1 tablet (25 mg total) by mouth 3 (three) times daily as needed for hiccoughs. (Patient not taking: Reported on 11/17/2015) 30 tablet 0  . folic acid (FOLVITE) 1 MG tablet Take 1 tablet (1 mg total) by mouth daily. (Patient not taking: Reported on 11/17/2015) 30 tablet 2  . prochlorperazine (COMPAZINE) 10 MG tablet Take 1 tablet (10 mg total) by mouth every 6 (six) hours as needed for nausea or vomiting. (Patient not taking: Reported on 11/17/2015) 30 tablet 0  . sildenafil (VIAGRA) 100 MG tablet Take 1 tablet (100 mg total) by mouth as directed. 10 tablet 0  . traMADol (ULTRAM) 50 MG tablet Take 1 tablet (50 mg total) by mouth every 6 (six) hours as needed for moderate pain. (Patient not taking: Reported on 11/17/2015) 20 tablet 0   No current facility-administered medications for this visit.    SURGICAL HISTORY:  Past Surgical History  Procedure Laterality Date  . None    . Colonoscopy    . Laparoscopic appendectomy N/A 01/27/2013    Procedure: APPENDECTOMY LAPAROSCOPIC;  Surgeon: Edward Jolly, MD;  Location: [facility];  Service: General;  Laterality: N/A;  . Appendectomy  12/2012  . Thyroidectomy    . Tonsillectomy    . Cardiac catheterization  ? 2008  . Video bronchoscopy N/A 05/31/2015    Procedure: VIDEO BRONCHOSCOPY;  Surgeon: Gaye Pollack, MD;  Location: [facility];  Service: Thoracic;  Laterality: N/A;  . Mediastinoscopy N/A 05/31/2015    Procedure: MEDIASTINOSCOPY;  Surgeon: Gaye Pollack, MD;  Location: [facility];  Service: Thoracic;  Laterality: N/A;    REVIEW OF SYSTEMS:  A comprehensive review of systems was negative except for: Musculoskeletal: positive for arthralgias   PHYSICAL EXAMINATION: General appearance: alert, cooperative, fatigued and no distress Head: Normocephalic, without obvious  abnormality, atraumatic Neck: no adenopathy, no JVD, supple, symmetrical, trachea midline and thyroid not enlarged, symmetric, no tenderness/mass/nodules Lymph nodes: Cervical, supraclavicular, and axillary nodes normal. Resp: clear to auscultation bilaterally Back: symmetric, no curvature. ROM normal. No CVA tenderness. Cardio: regular rate and rhythm, S1, S2 normal, no murmur, click, rub or gallop GI: soft, non-tender; bowel sounds normal; no masses,  no organomegaly Extremities: extremities normal, atraumatic, no cyanosis or edema Neurologic: Alert and oriented X 3, normal strength and tone. Normal symmetric reflexes. Normal coordination and gait  ECOG PERFORMANCE STATUS: 1 - Symptomatic but completely ambulatory  Blood pressure 145/81, pulse 109, temperature 97.7 F (36.5 C), temperature source Oral, resp. rate 18, height '5\' 6"'  (1.676 m), weight 161 lb 1.6 oz (73.074 kg), SpO2 100 %.  LABORATORY DATA: Lab Results  Component Value Date   WBC 3.4* 11/17/2015   HGB 13.4 11/17/2015  HCT 40.5 11/17/2015   MCV 91.7 11/17/2015   PLT 198 11/17/2015      Chemistry      Component Value Date/Time   NA 140 11/17/2015 0908   NA 142 05/31/2015 0645   K 3.7 11/17/2015 0908   K 3.7 05/31/2015 0645   CL 110 05/31/2015 0645   CO2 21* 11/17/2015 0908   CO2 25 05/31/2015 0645   BUN 16.4 11/17/2015 0908   BUN 8 05/31/2015 0645   CREATININE 1.0 11/17/2015 0908   CREATININE 1.03 05/31/2015 0645      Component Value Date/Time   CALCIUM 9.7 11/17/2015 0908   CALCIUM 9.1 05/31/2015 0645   ALKPHOS 52 11/17/2015 0908   ALKPHOS 42 05/31/2015 0645   AST 12 11/17/2015 0908   AST 15 05/31/2015 0645   ALT 15 11/17/2015 0908   ALT 17 05/31/2015 0645   BILITOT 0.33 11/17/2015 0908   BILITOT 0.4 05/31/2015 0645       RADIOGRAPHIC STUDIES: Dg Hip Unilat With Pelvis 2-3 Views Left  10/27/2015  CLINICAL DATA:  Lung cancer. Encounter for anti neoplastic chemotherapy. Left hip pain prostate  cancer. EXAM: DG HIP (WITH OR WITHOUT PELVIS) 2-3V LEFT COMPARISON:  None. FINDINGS: There is no evidence of hip fracture or dislocation. There is no evidence of arthropathy or other focal bone abnormality. IMPRESSION: Negative. Electronically Signed   By: Franchot Gallo M.D.   On: 10/27/2015 12:59    ASSESSMENT AND PLAN: This is a very pleasant 75 years old African-American male recently diagnosed with a stage IIIA non-small cell lung cancer, adenocarcinoma with negative EGFR mutation, negative ALK gene translocation and negative ROS 1. He completed a course of concurrent chemoradiation with weekly carboplatin and paclitaxel status post 5 cycles. The patient tolerated his treatment fairly well. The recent CT scan of the chest showed partial response in the primary left upper lobe neoplasm with no residual thoracic adenopathy. He was seen by Dr. Cyndia Bent for consideration of surgical resection but the patient became apprehensive about surgery after discussion of the risk and benefits of the procedure. He is currently undergoing consolidation chemotherapy with carboplatin and Alimta status post 2 cycles. He tolerated the first cycle of his treatment fairly well. I recommended for the patient to proceed with cycle #3 today as a scheduled. He would come back for follow-up visit in 3 weeks with repeat CT scan of the chest for restaging of his disease. The patient was advised to call immediately if he has any concerning symptoms in the interval. The patient voices understanding of current disease status and treatment options and is in agreement with the current care plan.  All questions were answered. The patient knows to call the clinic with any problems, questions or concerns. We can certainly see the patient much sooner if necessary.  Disclaimer: This note was dictated with voice recognition software. Similar sounding words can inadvertently be transcribed and may not be corrected upon review.

## 2015-11-17 NOTE — Telephone Encounter (Signed)
Pt stated he has not taken folic acid order received by [facility] , but it was put on hold "until we heard from the pt that he needed it".I have already ordered this as a future order in the computer. Called to [facility]

## 2015-11-17 NOTE — Telephone Encounter (Signed)
per pof to sch pt appt-pt already has copy of avs

## 2015-11-17 NOTE — Telephone Encounter (Signed)
Spoke with pt and he would like to change pharmacies from [facility] to [facility]. Pharmacy changed. Advised pt that we can only send in rx's with SN's name on them and that any other rx's he has from other physicians would need to be sent from that doctor. Pt voiced understanding. All of SN's rx's have been sent to new pharmacy. Nothing further needed.

## 2015-11-17 NOTE — Patient Instructions (Signed)
[facility] Discharge Instructions for Patients Receiving Chemotherapy  Today you received the following chemotherapy agents;  Alimta and Carboplatin.   To help prevent nausea and vomiting after your treatment, we encourage you to take your nausea medication as directed.     If you develop nausea and vomiting that is not controlled by your nausea medication, call the clinic.   BELOW ARE SYMPTOMS THAT SHOULD BE REPORTED IMMEDIATELY:  *FEVER GREATER THAN 100.5 F  *CHILLS WITH OR WITHOUT FEVER  NAUSEA AND VOMITING THAT IS NOT CONTROLLED WITH YOUR NAUSEA MEDICATION  *UNUSUAL SHORTNESS OF BREATH  *UNUSUAL BRUISING OR BLEEDING  TENDERNESS IN MOUTH AND THROAT WITH OR WITHOUT PRESENCE OF ULCERS  *URINARY PROBLEMS  *BOWEL PROBLEMS  UNUSUAL RASH Items with * indicate a potential emergency and should be followed up as soon as possible.  Feel free to call the clinic you have any questions or concerns. The clinic phone number is [phone].  Please show the [facility] at check-in to the Emergency Department and triage nurse.   Cyanocobalamin, Vitamin B12 injection What is this medicine? CYANOCOBALAMIN (sye an oh koe BAL a min) is a man made form of vitamin B12. Vitamin B12 is used in the growth of healthy blood cells, nerve cells, and proteins in the body. It also helps with the metabolism of fats and carbohydrates. This medicine is used to treat people who can not absorb vitamin B12. This medicine may be used for other purposes; ask your health care provider or pharmacist if you have questions. What should I tell my health care provider before I take this medicine? They need to know if you have any of these conditions: -kidney disease -Leber's disease -megaloblastic anemia -an unusual or allergic reaction to cyanocobalamin, cobalt, other medicines, foods, dyes, or preservatives -pregnant or trying to get pregnant -breast-feeding How should I use this  medicine? This medicine is injected into a muscle or deeply under the skin. It is usually given by a health care professional in a clinic or doctor's office. However, your doctor may teach you how to inject yourself. Follow all instructions. Talk to your pediatrician regarding the use of this medicine in children. Special care may be needed. Overdosage: If you think you have taken too much of this medicine contact a poison control center or emergency room at once. NOTE: This medicine is only for you. Do not share this medicine with others. What if I miss a dose? If you are given your dose at a clinic or doctor's office, call to reschedule your appointment. If you give your own injections and you miss a dose, take it as soon as you can. If it is almost time for your next dose, take only that dose. Do not take double or extra doses. What may interact with this medicine? -colchicine -heavy alcohol intake This list may not describe all possible interactions. Give your health care provider a list of all the medicines, herbs, non-prescription drugs, or dietary supplements you use. Also tell them if you smoke, drink alcohol, or use illegal drugs. Some items may interact with your medicine. What should I watch for while using this medicine? Visit your doctor or health care professional regularly. You may need blood work done while you are taking this medicine. You may need to follow a special diet. Talk to your doctor. Limit your alcohol intake and avoid smoking to get the best benefit. What side effects may I notice from receiving this medicine? Side effects  that you should report to your doctor or health care professional as soon as possible: -allergic reactions like skin rash, itching or hives, swelling of the face, lips, or tongue -blue tint to skin -chest tightness, pain -difficulty breathing, wheezing -dizziness -red, swollen painful area on the leg Side effects that usually do not require  medical attention (report to your doctor or health care professional if they continue or are bothersome): -diarrhea -headache This list may not describe all possible side effects. Call your doctor for medical advice about side effects. You may report side effects to FDA at 1-800-FDA-1088. Where should I keep my medicine? Keep out of the reach of children. Store at room temperature between 15 and 30 degrees C (59 and 85 degrees F). Protect from light. Throw away any unused medicine after the expiration date. NOTE: This sheet is a summary. It may not cover all possible information. If you have questions about this medicine, talk to your doctor, pharmacist, or health care provider.    2016, Elsevier/Gold Standard. (2007-09-29 22:10:20)

## 2015-11-20 DIAGNOSIS — M25559 Pain in unspecified hip: Secondary | ICD-10-CM

## 2015-11-24 ENCOUNTER — Other Ambulatory Visit (HOSPITAL_BASED_OUTPATIENT_CLINIC_OR_DEPARTMENT_OTHER): Payer: Medicare HMO

## 2015-11-24 ENCOUNTER — Other Ambulatory Visit: Payer: Self-pay | Admitting: Medical Oncology

## 2015-11-24 DIAGNOSIS — C3411 Malignant neoplasm of upper lobe, right bronchus or lung: Secondary | ICD-10-CM | POA: Diagnosis not present

## 2015-11-24 DIAGNOSIS — R066 Hiccough: Secondary | ICD-10-CM

## 2015-11-24 LAB — COMPREHENSIVE METABOLIC PANEL
ALT: 9 U/L (ref 0–55)
ANION GAP: 9 meq/L (ref 3–11)
AST: 13 U/L (ref 5–34)
Albumin: 3.5 g/dL (ref 3.5–5.0)
Alkaline Phosphatase: 53 U/L (ref 40–150)
BUN: 13.5 mg/dL (ref 7.0–26.0)
CALCIUM: 9.3 mg/dL (ref 8.4–10.4)
CHLORIDE: 105 meq/L (ref 98–109)
CO2: 25 mEq/L (ref 22–29)
Creatinine: 1 mg/dL (ref 0.7–1.3)
EGFR: 81 mL/min/{1.73_m2} — ABNORMAL LOW (ref >90)
Glucose: 98 mg/dl (ref 70–140)
POTASSIUM: 3.6 meq/L (ref 3.5–5.1)
Sodium: 139 mEq/L (ref 136–145)
TOTAL PROTEIN: 6.6 g/dL (ref 6.4–8.3)
Total Bilirubin: 0.49 mg/dL (ref 0.20–1.20)

## 2015-11-24 LAB — CBC WITH DIFFERENTIAL/PLATELET
BASO%: 0 % (ref 0.0–2.0)
Basophils Absolute: 0 10*3/uL (ref 0.0–0.1)
EOS%: 2.6 % (ref 0.0–7.0)
Eosinophils Absolute: 0 10*3/uL (ref 0.0–0.5)
HEMATOCRIT: 37.6 % — AB (ref 38.4–49.9)
HEMOGLOBIN: 12.9 g/dL — AB (ref 13.0–17.1)
LYMPH#: 0.6 10*3/uL — AB (ref 0.9–3.3)
LYMPH%: 37 % (ref 14.0–49.0)
MCH: 30.6 pg (ref 27.2–33.4)
MCHC: 34.3 g/dL (ref 32.0–36.0)
MCV: 89.3 fL (ref 79.3–98.0)
MONO#: 0.2 10*3/uL (ref 0.1–0.9)
MONO%: 11 % (ref 0.0–14.0)
NEUT%: 49.4 % (ref 39.0–75.0)
NEUTROS ABS: 0.8 10*3/uL — AB (ref 1.5–6.5)
Platelets: 167 10*3/uL (ref 140–400)
RBC: 4.21 10*6/uL (ref 4.20–5.82)
RDW: 14.7 % — AB (ref 11.0–14.6)
WBC: 1.5 10*3/uL — ABNORMAL LOW (ref 4.0–10.3)

## 2015-11-24 MED ORDER — CHLORPROMAZINE HCL 25 MG PO TABS: 25.0000 mg | ORAL_TABLET | Freq: Three times a day (TID) | ORAL | Status: DC | PRN

## 2015-11-29 ENCOUNTER — Telehealth: Payer: Self-pay | Admitting: Internal Medicine

## 2015-11-29 NOTE — Telephone Encounter (Signed)
Faxed pt medical records to [facility]

## 2015-11-30 DIAGNOSIS — M25559 Pain in unspecified hip: Secondary | ICD-10-CM

## 2015-12-01 ENCOUNTER — Ambulatory Visit (HOSPITAL_COMMUNITY)
Admission: RE | Admit: 2015-12-01 | Discharge: 2015-12-01 | Disposition: A | Discharge status: Home / Self Care | Payer: Medicare HMO | Source: Ambulatory Visit | Attending: Internal Medicine | Admitting: Internal Medicine

## 2015-12-01 ENCOUNTER — Encounter (HOSPITAL_COMMUNITY): Payer: Self-pay

## 2015-12-01 ENCOUNTER — Telehealth: Payer: Self-pay | Admitting: *Deleted

## 2015-12-01 ENCOUNTER — Other Ambulatory Visit (HOSPITAL_BASED_OUTPATIENT_CLINIC_OR_DEPARTMENT_OTHER): Payer: Medicare HMO

## 2015-12-01 DIAGNOSIS — N62 Hypertrophy of breast: Secondary | ICD-10-CM | POA: Insufficient documentation

## 2015-12-01 DIAGNOSIS — M5410 Radiculopathy, site unspecified: Secondary | ICD-10-CM

## 2015-12-01 DIAGNOSIS — M25552 Pain in left hip: Secondary | ICD-10-CM

## 2015-12-01 DIAGNOSIS — R918 Other nonspecific abnormal finding of lung field: Secondary | ICD-10-CM | POA: Diagnosis not present

## 2015-12-01 DIAGNOSIS — C3411 Malignant neoplasm of upper lobe, right bronchus or lung: Secondary | ICD-10-CM

## 2015-12-01 DIAGNOSIS — Z5111 Encounter for antineoplastic chemotherapy: Secondary | ICD-10-CM | POA: Insufficient documentation

## 2015-12-01 DIAGNOSIS — M25551 Pain in right hip: Secondary | ICD-10-CM

## 2015-12-01 LAB — COMPREHENSIVE METABOLIC PANEL
ALBUMIN: 3.7 g/dL (ref 3.5–5.0)
ALK PHOS: 56 U/L (ref 40–150)
ANION GAP: 9 meq/L (ref 3–11)
AST: 13 U/L (ref 5–34)
BUN: 10.6 mg/dL (ref 7.0–26.0)
CALCIUM: 9.6 mg/dL (ref 8.4–10.4)
CHLORIDE: 110 meq/L — AB (ref 98–109)
CO2: 23 mEq/L (ref 22–29)
CREATININE: 1 mg/dL (ref 0.7–1.3)
EGFR: 83 mL/min/{1.73_m2} — ABNORMAL LOW (ref >90)
Glucose: 95 mg/dl (ref 70–140)
Potassium: 3.5 mEq/L (ref 3.5–5.1)
Sodium: 142 mEq/L (ref 136–145)
Total Bilirubin: 0.51 mg/dL (ref 0.20–1.20)
Total Protein: 6.7 g/dL (ref 6.4–8.3)

## 2015-12-01 LAB — CBC WITH DIFFERENTIAL/PLATELET
BASO%: 0.4 % (ref 0.0–2.0)
Basophils Absolute: 0 10*3/uL (ref 0.0–0.1)
EOS%: 1.1 % (ref 0.0–7.0)
Eosinophils Absolute: 0 10*3/uL (ref 0.0–0.5)
HEMATOCRIT: 38.7 % (ref 38.4–49.9)
HEMOGLOBIN: 12.8 g/dL — AB (ref 13.0–17.1)
LYMPH%: 33.4 % (ref 14.0–49.0)
MCH: 30.4 pg (ref 27.2–33.4)
MCHC: 33.1 g/dL (ref 32.0–36.0)
MCV: 91.7 fL (ref 79.3–98.0)
MONO#: 0.4 10*3/uL (ref 0.1–0.9)
MONO%: 20.2 % — AB (ref 0.0–14.0)
NEUT#: 0.8 10*3/uL — ABNORMAL LOW (ref 1.5–6.5)
NEUT%: 44.9 % (ref 39.0–75.0)
PLATELETS: 181 10*3/uL (ref 140–400)
RBC: 4.22 10*6/uL (ref 4.20–5.82)
RDW: 16.7 % — ABNORMAL HIGH (ref 11.0–14.6)
WBC: 1.9 10*3/uL — ABNORMAL LOW (ref 4.0–10.3)
lymph#: 0.6 10*3/uL — ABNORMAL LOW (ref 0.9–3.3)

## 2015-12-01 MED ORDER — IOPAMIDOL (ISOVUE-300) INJECTION 61%
75.0000 mL | Freq: Once | INTRAVENOUS | Status: AC | PRN
  Administered 2015-12-01: 75 mL via INTRAVENOUS

## 2015-12-01 NOTE — Addendum Note (Signed)
Addended by: Ardeen Garland on: 12/01/2015 11:45 AM   Modules accepted: Orders

## 2015-12-01 NOTE — Telephone Encounter (Signed)
TC from pt's daughter Shirlean Mylar(?). She states that her father is having ongoing issues with pain in his hip. Pt had discussed with Dr. [name] at last visit and it is being attributed to sciatica. Pt is only taking tylenol without much relief. Pt has Tramadol ordered but has only taken one tablet and he told his daughter he didn't like how it made him feel. Advised daughter to tell him to give the tramadol a chance, that the sleepiness he feels will gradually stop though the pain relief will continue. Daughter also asked if it would be ok to try an OTC topical cream. Advised her that he could try that also. Advised daughter to call back if these pain relief measures are not effective. Daughter voiced understanding.

## 2015-12-01 NOTE — Telephone Encounter (Signed)
Per [name] he ordered MRI hip. Onc tx request sent

## 2015-12-05 ENCOUNTER — Inpatient Hospital Stay (HOSPITAL_COMMUNITY): Admission: RE | Admit: 2015-12-05 | Payer: Medicare HMO | Source: Ambulatory Visit

## 2015-12-05 ENCOUNTER — Other Ambulatory Visit: Payer: Self-pay | Admitting: Internal Medicine

## 2015-12-05 ENCOUNTER — Ambulatory Visit (HOSPITAL_COMMUNITY): Admission: RE | Admit: 2015-12-05 | Payer: Medicare HMO | Source: Ambulatory Visit

## 2015-12-05 DIAGNOSIS — M25552 Pain in left hip: Secondary | ICD-10-CM

## 2015-12-06 ENCOUNTER — Ambulatory Visit (HOSPITAL_COMMUNITY)
Admission: RE | Admit: 2015-12-06 | Discharge: 2015-12-06 | Disposition: A | Discharge status: Home / Self Care | Payer: Medicare HMO | Source: Ambulatory Visit | Attending: Internal Medicine | Admitting: Internal Medicine

## 2015-12-06 ENCOUNTER — Ambulatory Visit (HOSPITAL_COMMUNITY): Admission: RE | Admit: 2015-12-06 | Payer: Medicare HMO | Source: Ambulatory Visit

## 2015-12-06 DIAGNOSIS — M25552 Pain in left hip: Secondary | ICD-10-CM | POA: Insufficient documentation

## 2015-12-06 DIAGNOSIS — M5136 Other intervertebral disc degeneration, lumbar region: Secondary | ICD-10-CM | POA: Insufficient documentation

## 2015-12-06 DIAGNOSIS — M5116 Intervertebral disc disorders with radiculopathy, lumbar region: Secondary | ICD-10-CM | POA: Insufficient documentation

## 2015-12-06 DIAGNOSIS — M2548 Effusion, other site: Secondary | ICD-10-CM | POA: Diagnosis not present

## 2015-12-06 MED ORDER — GADOBENATE DIMEGLUMINE 529 MG/ML IV SOLN
15.0000 mL | Freq: Once | INTRAVENOUS | Status: AC | PRN
  Administered 2015-12-06: 15 mL via INTRAVENOUS

## 2015-12-07 MED ORDER — TRAMADOL HCL 50 MG PO TABS: 50.0000 mg | ORAL_TABLET | Freq: Four times a day (QID) | ORAL | Status: DC | PRN

## 2015-12-07 NOTE — Telephone Encounter (Signed)
Reviewed pt request with MD who advised Tramadol will be refilled only this once as a courtesy. Going forward PCP will need to refill. Called in refill to pt pharmacy Pt notified.

## 2015-12-07 NOTE — Telephone Encounter (Signed)
Call from patient's daughter Shirlean Mylar.  "Dad asked me to call Dr, [name] for a Tramadol refill.  He's used the last one.  I made sure he took it and it's working!  Send/call refill to [facility] on [address] today as he's taken the last one.  Tramadol 50 mg, take 1 every 6 hrs."

## 2015-12-07 NOTE — Addendum Note (Signed)
Addended by: Lucile Crater on: 12/07/2015 12:59 PM   Modules accepted: Orders

## 2015-12-08 ENCOUNTER — Ambulatory Visit: Payer: Medicare HMO

## 2015-12-08 ENCOUNTER — Other Ambulatory Visit (HOSPITAL_BASED_OUTPATIENT_CLINIC_OR_DEPARTMENT_OTHER): Payer: Medicare HMO

## 2015-12-08 ENCOUNTER — Encounter: Payer: Self-pay | Admitting: Internal Medicine

## 2015-12-08 ENCOUNTER — Ambulatory Visit (HOSPITAL_BASED_OUTPATIENT_CLINIC_OR_DEPARTMENT_OTHER): Payer: Medicare HMO | Admitting: Internal Medicine

## 2015-12-08 ENCOUNTER — Encounter: Payer: Self-pay | Admitting: *Deleted

## 2015-12-08 VITALS — BP 119/60 | HR 102 | Temp 97.5°F | Resp 18 | Wt 154.9 lb

## 2015-12-08 DIAGNOSIS — Z5111 Encounter for antineoplastic chemotherapy: Secondary | ICD-10-CM

## 2015-12-08 DIAGNOSIS — M25552 Pain in left hip: Secondary | ICD-10-CM

## 2015-12-08 DIAGNOSIS — M545 Low back pain: Secondary | ICD-10-CM | POA: Diagnosis not present

## 2015-12-08 DIAGNOSIS — C3411 Malignant neoplasm of upper lobe, right bronchus or lung: Secondary | ICD-10-CM

## 2015-12-08 LAB — COMPREHENSIVE METABOLIC PANEL
ALK PHOS: 53 U/L (ref 40–150)
ALT: <9 U/L (ref 0–55)
AST: 14 U/L (ref 5–34)
Albumin: 3.6 g/dL (ref 3.5–5.0)
Anion Gap: 9 mEq/L (ref 3–11)
BILIRUBIN TOTAL: 0.35 mg/dL (ref 0.20–1.20)
BUN: 11.5 mg/dL (ref 7.0–26.0)
CO2: 23 mEq/L (ref 22–29)
Calcium: 9.7 mg/dL (ref 8.4–10.4)
Chloride: 107 mEq/L (ref 98–109)
Creatinine: 1 mg/dL (ref 0.7–1.3)
EGFR: 87 mL/min/{1.73_m2} — ABNORMAL LOW (ref >90)
GLUCOSE: 89 mg/dL (ref 70–140)
POTASSIUM: 3.6 meq/L (ref 3.5–5.1)
SODIUM: 139 meq/L (ref 136–145)
TOTAL PROTEIN: 6.8 g/dL (ref 6.4–8.3)

## 2015-12-08 LAB — CBC WITH DIFFERENTIAL/PLATELET
BASO%: 0.5 % (ref 0.0–2.0)
Basophils Absolute: 0 10*3/uL (ref 0.0–0.1)
EOS%: 1 % (ref 0.0–7.0)
Eosinophils Absolute: 0 10*3/uL (ref 0.0–0.5)
HCT: 37 % — ABNORMAL LOW (ref 38.4–49.9)
HEMOGLOBIN: 12.8 g/dL — AB (ref 13.0–17.1)
LYMPH%: 34.3 % (ref 14.0–49.0)
MCH: 30.9 pg (ref 27.2–33.4)
MCHC: 34.6 g/dL (ref 32.0–36.0)
MCV: 89.4 fL (ref 79.3–98.0)
MONO#: 0.4 10*3/uL (ref 0.1–0.9)
MONO%: 21.7 % — AB (ref 0.0–14.0)
NEUT%: 42.5 % (ref 39.0–75.0)
NEUTROS ABS: 0.8 10*3/uL — AB (ref 1.5–6.5)
Platelets: 201 10*3/uL (ref 140–400)
RBC: 4.14 10*6/uL — ABNORMAL LOW (ref 4.20–5.82)
RDW: 15.7 % — AB (ref 11.0–14.6)
WBC: 2 10*3/uL — AB (ref 4.0–10.3)
lymph#: 0.7 10*3/uL — ABNORMAL LOW (ref 0.9–3.3)

## 2015-12-08 NOTE — Progress Notes (Signed)
[place] [phone]   [phone]  OFFICE PROGRESS NOTE  [name], MD [place] [address]  DIAGNOSIS: Stage IIIA (T1b, N2, M0) non-small cell lung cancer, adenocarcinoma presented with right right upper lobe nodule in addition to mediastinal lymphadenopathy diagnosed in November 2016.  PRIOR THERAPY:  1) Concurrent chemoradiation with weekly carboplatin for AUC of 2 and paclitaxel 45 MG/M2. Status post 5 cycles. Last dose was given 07/18/2015 with partial response. 2) Consolidation chemotherapy with carboplatin for AUC of 5 and Alimta 500 MG/M2 every 3 weeks. First dose 10/06/2015. Status post 3 cycles.   CURRENT THERAPY: None.  INTERVAL HISTORY: Alan [name] 75 y.o. male returns to the clinic today for follow-up visit accompanied by his wife. The patient related the third cycle of his chemotherapy with carboplatin and Alimta fairly well. He continues to complain of low back pain with radiation to the left and right legs. He is currently using a cane for ambulation. He has been using Tylenol with no improvement. We'll give him prescription for tramadol yesterday but he has not filled it yet. He denied having any significant nausea or vomiting. He has no fever or chills. He denied having any significant chest pain, shortness of breath, cough or hemoptysis. He has no significant weight loss or night sweats. He had repeat CT scan of the chest for restaging of his disease in addition to MRI of the lumbar spine performed recently and he is here for evaluation and discussion of his imaging studies and further recommendation regarding his condition.  MEDICAL HISTORY: Past Medical History  Diagnosis Date  . Sleep apnea     does not use cpap  . Unspecified essential hypertension   . Hypercholesteremia   . GERD (gastroesophageal reflux disease)   . Diverticulosis of colon (without mention of hemorrhage)   . Irritable bowel  syndrome   . Benign neoplasm of colon     colon polyps  . Benign hypertrophy of prostate   . Erectile dysfunction   . Restless legs syndrome (RLS)   . Anxiety state, unspecified   . Onychomycosis   . Constipation, chronic   . Hypothyroidism   . Cancer [facility])     prostate cancer  . Glaucoma   . Radiation 06/20/15-08/02/15    right upper lung region 60 gray  . Encounter for antineoplastic chemotherapy 09/29/2015  . Left hip pain 10/27/2015    ALLERGIES:  is allergic to codeine.  MEDICATIONS:  Current Outpatient Prescriptions  Medication Sig Dispense Refill  . amLODipine (NORVASC) 5 MG tablet Take 1 tablet (5 mg total) by mouth every morning. 90 tablet 3  . aspirin EC 81 MG tablet Take 81 mg by mouth daily.     . brimonidine-timolol (COMBIGAN) 0.2-0.5 % ophthalmic solution Place 1 drop into both eyes every 12 (twelve) hours.    . chlorproMAZINE (THORAZINE) 25 MG tablet Take 1 tablet (25 mg total) by mouth 3 (three) times daily as needed for hiccoughs. 30 tablet 0  . dexamethasone (DECADRON) 4 MG tablet 4 mg by mouth twice a day the day before, day of and day after the chemotherapy every 3 weeks. 40 tablet 0  . finasteride ([name]) 5 MG tablet Take 5 mg by mouth daily.    . fluticasone (FLONASE) 50 MCG/ACT nasal spray Place 2 sprays into both nostrils daily as needed for allergies or rhinitis. 48 g 3  . folic acid (FOLVITE) 1 MG tablet Take 1  tablet (1 mg total) by mouth daily. 30 tablet 2  . lactulose (CHRONULAC) 10 GM/15ML solution Take 10 g by mouth 2 (two) times daily as needed (constipation).     [name] levothyroxine (SYNTHROID, LEVOTHROID) 100 MCG tablet Take 1 tablet (100 mcg total) by mouth daily. 45 tablet 2  . Linaclotide (LINZESS) 290 MCG CAPS capsule Take 1 capsule (290 mcg total) by mouth daily. 30 capsule 3  . meloxicam (MOBIC) 7.5 MG tablet Take 1 tablet (7.5 mg total) by mouth daily. 30 tablet 1  . omeprazole (PRILOSEC) 20 MG capsule Take 1 capsule (20 mg total) by mouth every  morning. 90 capsule 0  . polyethylene glycol (MIRALAX / GLYCOLAX) packet Take 17 g by mouth daily as needed (constipation). Reported on 06/21/2015    . prochlorperazine (COMPAZINE) 10 MG tablet Take 1 tablet (10 mg total) by mouth every 6 (six) hours as needed for nausea or vomiting. 30 tablet 0  . sildenafil (VIAGRA) 100 MG tablet Take 1 tablet (100 mg total) by mouth as directed. 10 tablet 0  . simvastatin (ZOCOR) 20 MG tablet Take 1 tablet (20 mg total) by mouth at bedtime. 90 tablet 3  . sucralfate (CARAFATE) 1 g tablet Take 1 tablet (1 g total) by mouth 4 (four) times daily -  with meals and at bedtime. Mix in 2Tbs water, swish and swallow 60 tablet 0  . tamsulosin (FLOMAX) 0.4 MG CAPS capsule Take 2 capsules (0.8 mg total) by mouth at bedtime. 90 capsule 3  . traMADol (ULTRAM) 50 MG tablet Take 1 tablet (50 mg total) by mouth every 6 (six) hours as needed for moderate pain. 30 tablet 0   No current facility-administered medications for this visit.    SURGICAL HISTORY:  Past Surgical History  Procedure Laterality Date  . None    . Colonoscopy    . Laparoscopic appendectomy N/A 01/27/2013    Procedure: APPENDECTOMY LAPAROSCOPIC;  Surgeon: Mariella Saa, MD;  Location: [facility] ORS;  Service: General;  Laterality: N/A;  . Appendectomy  12/2012  . Thyroidectomy    . Tonsillectomy    . Cardiac catheterization  ? 2008  . Video bronchoscopy N/A 05/31/2015    Procedure: VIDEO BRONCHOSCOPY;  Surgeon: Alleen Borne, MD;  Location: [facility];  Service: Thoracic;  Laterality: N/A;  . Mediastinoscopy N/A 05/31/2015    Procedure: MEDIASTINOSCOPY;  Surgeon: Alleen Borne, MD;  Location: [facility] OR;  Service: Thoracic;  Laterality: N/A;    REVIEW OF SYSTEMS:  Constitutional: positive for fatigue Eyes: negative Ears, nose, mouth, throat, and face: negative Respiratory: negative Cardiovascular: negative Gastrointestinal: negative Genitourinary:negative Integument/breast:  negative Hematologic/lymphatic: negative Musculoskeletal:positive for arthralgias and back pain Neurological: negative Behavioral/Psych: negative Endocrine: negative Allergic/Immunologic: negative   PHYSICAL EXAMINATION: General appearance: alert, cooperative, fatigued and no distress Head: Normocephalic, without obvious abnormality, atraumatic Neck: no adenopathy, no JVD, supple, symmetrical, trachea midline and thyroid not enlarged, symmetric, no tenderness/mass/nodules Lymph nodes: Cervical, supraclavicular, and axillary nodes normal. Resp: clear to auscultation bilaterally Back: symmetric, no curvature. ROM normal. No CVA tenderness. Cardio: regular rate and rhythm, S1, S2 normal, no murmur, click, rub or gallop GI: soft, non-tender; bowel sounds normal; no masses,  no organomegaly Extremities: extremities normal, atraumatic, no cyanosis or edema Neurologic: Alert and oriented X 3, normal strength and tone. Normal symmetric reflexes. Normal coordination and gait  ECOG PERFORMANCE STATUS: 1 - Symptomatic but completely ambulatory  Blood pressure 119/60, pulse 102, temperature 97.5 F (36.4 C), temperature source Oral, resp. rate  18, weight 154 lb 14.4 oz (70.262 kg), SpO2 100 %.  LABORATORY DATA: Lab Results  Component Value Date   WBC 2.0* 12/08/2015   HGB 12.8* 12/08/2015   HCT 37.0* 12/08/2015   MCV 89.4 12/08/2015   PLT 201 12/08/2015      Chemistry      Component Value Date/Time   NA 142 12/01/2015 0909   NA 142 05/31/2015 0645   K 3.5 12/01/2015 0909   K 3.7 05/31/2015 0645   CL 110 05/31/2015 0645   CO2 23 12/01/2015 0909   CO2 25 05/31/2015 0645   BUN 10.6 12/01/2015 0909   BUN 8 05/31/2015 0645   CREATININE 1.0 12/01/2015 0909   CREATININE 1.03 05/31/2015 0645      Component Value Date/Time   CALCIUM 9.6 12/01/2015 0909   CALCIUM 9.1 05/31/2015 0645   ALKPHOS 56 12/01/2015 0909   ALKPHOS 42 05/31/2015 0645   AST 13 12/01/2015 0909   AST 15 05/31/2015  0645   ALT <9 12/01/2015 0909   ALT 17 05/31/2015 0645   BILITOT 0.51 12/01/2015 0909   BILITOT 0.4 05/31/2015 0645       RADIOGRAPHIC STUDIES: Ct Chest W Contrast  12/01/2015  CLINICAL DATA:  Re-stage right upper lobe stage IIIA (T1b N2 M0) lung adenocarcinoma diagnosed November 2016, status post concurrent chemoradiation therapy and consolidation chemotherapy. EXAM: CT CHEST WITH CONTRAST TECHNIQUE: Multidetector CT imaging of the chest was performed during intravenous contrast administration. CONTRAST:  64m ISOVUE-300 IOPAMIDOL (ISOVUE-300) INJECTION 61% COMPARISON:  09/05/2015 chest CT. FINDINGS: Mediastinum/Nodes: Normal heart size. No pericardial fluid/thickening. Atherosclerotic nonaneurysmal thoracic aorta. Normal caliber pulmonary arteries. No central pulmonary emboli. Atrophic appearing thyroid. Normal esophagus. No pathologically enlarged axillary, mediastinal or hilar lymph nodes. Lungs/Pleura: No pneumothorax. No pleural effusion. Posterior right upper lobe irregular 2.0 x 1.2 cm nodule (series 5/ image 32), slightly decreased from 2.1 x 1.3 cm on 09/05/2015. There are 3 new solid pulmonary nodules in the posterior right upper lobe, largest 1.2 x 0.6 cm (average diameter 0.9 cm, series 5/ image 51) and 0.7 cm (series 5/ image 57). There is a new 0.5 cm subpleural nodule in the anterior left upper lobe (series 5/image 78). There is new focal mild peribronchovascular interstitial thickening in the basilar right upper lobe (series 5/image 64). Previously described additional tiny scattered pulmonary nodules in both lungs measuring up to 0.4 cm (series 5/image 75) are stable. Ground-glass 1.0 x 0.6 cm apical left upper lobe pulmonary nodule (series 5/image 25) is stable. Mild centrilobular emphysema. Upper abdomen: Simple 1.5 cm posterior upper right renal cyst. Additional subcentimeter hypodense renal cortical lesions in the upper right kidney are too small to characterize and appear unchanged.  Stable mild irregular thickening of both adrenal glands without discrete adrenal nodules. Musculoskeletal: No aggressive appearing focal osseous lesions. Mild degenerative changes in the thoracic spine. Stable prominent asymmetric left gynecomastia. IMPRESSION: 1. Primary right upper lobe tumor is slightly decreased in size. 2. Three new solid right upper lobe pulmonary nodules measuring up to 0.9 cm and solitary new 0.5 cm left upper lobe pulmonary nodule. These nodules are indeterminate but worrisome for metastatic disease. 3. New focus of peribronchovascular interstitial thickening in the basilar right upper lobe, favor inflammatory etiology. 4. Previously described pulmonary nodules are stable. 5. No thoracic adenopathy. 6. Stable prominently asymmetric left gynecomastia. Electronically Signed   By: JIlona SorrelM.D.   On: 12/01/2015 10:45   Mr Lumbar Spine W Wo Contrast  12/06/2015  CLINICAL DATA:  Bilateral hip, thigh and groin pain for 1 month. History of lung carcinoma. Question metastatic disease. EXAM: MRI LUMBAR SPINE WITHOUT AND WITH CONTRAST TECHNIQUE: Multiplanar and multiecho pulse sequences of the lumbar spine were obtained without and with intravenous contrast. CONTRAST:  15 ml MULTIHANCE GADOBENATE DIMEGLUMINE 529 MG/ML IV SOLN COMPARISON:  PET CT scan 05/19/2015. FINDINGS: Segmentation:  Unremarkable. Alignment: Facet degenerative change results in 0.3 cm anterolisthesis L4 on L5 and trace anterolisthesis L5 on S1. Vertebrae: Vertebral body height is maintained. No marrow signal abnormality to suggest metastatic disease is identified. Conus medullaris: Extends to the L1-2 level and appears normal. Paraspinal and other soft tissues: Small right renal cyst is noted. Disc levels: T11-12 and T12-L1 are imaged in the sagittal plane only and negative. L1-2:  Mild facet degenerative change.  Otherwise negative. L2-3:  Negative. L3-4: A left foraminal disc protrusion impinges on the exiting left L3  root. The central canal and right foramen are open. Mild facet degenerative change is seen. L4-5: Bilateral facet degenerative disease is worse on the left where there is some marrow edema about the facet. The disc is uncovered. The central canal and foramina are open. L5-S1: Right worse than left facet degenerative disease is identified. Minimal disc bulge without central canal or foraminal stenosis is seen. IMPRESSION: Negative for metastatic disease. Left foraminal protrusion at L3-4 impinges on the exiting left L3 root. Facet degenerative change appears worst on the left at L4-5 where there is some marrow edema about the joint. Electronically Signed   By: Drusilla Kanner M.D.   On: 12/06/2015 08:53    ASSESSMENT AND PLAN: This is a very pleasant 75 years old African-American male recently diagnosed with a stage IIIA non-small cell lung cancer, adenocarcinoma with negative EGFR mutation, negative ALK gene translocation and negative ROS 1. He completed a course of concurrent chemoradiation with weekly carboplatin and paclitaxel status post 5 cycles. The patient tolerated his treatment fairly well. The recent CT scan of the chest showed partial response in the primary left upper lobe neoplasm with no residual thoracic adenopathy. He was seen by Dr. Laneta Simmers for consideration of surgical resection but the patient became apprehensive about surgery after discussion of the risk and benefits of the procedure. He is currently undergoing consolidation chemotherapy with carboplatin and Alimta status post 3 cycles. He tolerated his treatment fairly well. The recent CT scan of the chest showed improvement of the primary right upper lobe tumor but there was seen you solid right upper lobe pulmonary nodules suspicious for metastasis. I discussed the scan results with the patient and his wife. I recommended for him to have a PET scan performed within the next week for evaluation of these nodules and to rule out any  metastasis. For the persistent low back pain, the patient had MRI of the lumbar spine that showed no evidence of metastatic disease but there was degenerative changes and left foraminal protrusion at L3-4 with impingement on the exiting left L3 root. I discussed the results with the patient and his wife and recommended for him referral to orthopedic surgery for further evaluation. He was also given prescription for tramadol until he sees the orthopedic surgeon. I will see the patient back for follow-up visit in 2 weeks for reevaluation and discussion of his PET scan results and further recommendation regarding his condition. The patient was advised to call immediately if he has any concerning symptoms in the interval. The patient voices understanding of current disease status and treatment options and  is in agreement with the current care plan.  All questions were answered. The patient knows to call the clinic with any problems, questions or concerns. We can certainly see the patient much sooner if necessary.  Disclaimer: This note was dictated with voice recognition software. Similar sounding words can inadvertently be transcribed and may not be corrected upon review.

## 2015-12-08 NOTE — Progress Notes (Signed)
Oncology Nurse Navigator Documentation  Oncology Nurse Navigator Flowsheets 12/08/2015  Navigator Encounter Type Clinic/MDC  Patient Visit Type MedOnc  Treatment Phase   Barriers/Navigation Needs No barriers at this time  Interventions None required  Acuity Level 1  Acuity Level 1 Minimal follow up required  Time Spent with Patient 15   I spoke with patient and wife today at [facility].  He is on observation.  No barriers at this time.

## 2015-12-09 ENCOUNTER — Telehealth: Payer: Self-pay | Admitting: Internal Medicine

## 2015-12-09 NOTE — Telephone Encounter (Signed)
s.w. pt and advised on 6.12 appt @ 3:15 at [facility]....and 6.23 with Dr. Tyrell Antonio..Marland KitchenMarland KitchenMarland Kitchenpt ok and aware

## 2015-12-09 NOTE — Telephone Encounter (Signed)
Faxed pt medical records to  ortho [phone]

## 2015-12-15 ENCOUNTER — Encounter: Payer: Self-pay | Admitting: Radiation Oncology

## 2015-12-15 ENCOUNTER — Other Ambulatory Visit: Payer: Self-pay | Admitting: Pulmonary Disease

## 2015-12-15 ENCOUNTER — Ambulatory Visit
Admission: RE | Admit: 2015-12-15 | Discharge: 2015-12-15 | Disposition: A | Discharge status: Home / Self Care | Payer: Medicare HMO | Source: Ambulatory Visit | Attending: Radiation Oncology | Admitting: Radiation Oncology

## 2015-12-15 VITALS — BP 106/70 | HR 112 | Temp 97.5°F | Ht 66.0 in | Wt 155.8 lb

## 2015-12-15 DIAGNOSIS — C3411 Malignant neoplasm of upper lobe, right bronchus or lung: Secondary | ICD-10-CM

## 2015-12-15 NOTE — Progress Notes (Signed)
Alan [name] here for follow up.  He reports having pain in his lower back and hips and is seeing an orthopedic surgeon.  He is taking tramadol q 6 hours.  He denies having any shortness of breath.  He reports having a productive cough with clear sputum.  He denies having hemoptysis.  He said he feels like there is always something in the center of his chest that needs to be coughed up.  He denies having a sore throat or trouble swallowing.  He reports having a low energy level.  The skin on his right chest and right upper back has hyperpigmentation. His heart rate was elevated today at 112 and regular.  When retaken it was 108.  BP 106/70 mmHg  Pulse 112  Temp(Src) 97.5 F (36.4 C) (Oral)  Ht '5\' 6"'$  (1.676 m)  Wt 155 lb 12.8 oz (70.67 kg)  BMI 25.16 kg/m2  SpO2 99%   Wt Readings from Last 3 Encounters:  12/15/15 155 lb 12.8 oz (70.67 kg)  12/08/15 154 lb 14.4 oz (70.262 kg)  11/17/15 161 lb 1.6 oz (73.074 kg)

## 2015-12-15 NOTE — Progress Notes (Signed)
Radiation Oncology         [phone] ________________________________  Name: Alan [name] MRN: [id number]  Date: 12/15/2015  DOB: [birth year]  Follow-Up Visit Note  CC: [name], MD  Gaye Pollack, MD  No diagnosis found.  Diagnosis: Stage III-a Non-Small Cell Lung Cancer  Indication for treatment: Definitive treatment along with radiosensitizing chemotherapy   Radiation treatment dates: 06/20/2015-08/02/2015   Site/dose: Right upper lung region 60 gray in 30 fractions  Narrative:   Alan [name] here for follow up. He reports having pain in his lower back and hips and is seeing an orthopedic surgeon. He is taking tramadol q 6 hours. He denies having any shortness of breath. He reports having a productive cough with clear sputum. He denies having hemoptysis. He said he feels like there is always something in the center of his chest that needs to be coughed up. He denies having a sore throat or trouble swallowing. He reports having a low energy level. The skin on his right chest and right upper back has hyperpigmentation. His heart rate was elevated today at 112 and regular. When retaken it was 108. He states his appetite is improving.   On 12/01/15 chest CT scan, his primary right upper lobe has slightly decreased in size. However, there are three new solid right upper lobe pulmonary nodules measuring up to 0.9 cn and a solitary new 0.5 cm left upper lobe pulmonary nodule. These nodules are indeterminate and will be further worked up with PET scan on 12/19/15  He is meeting with Dr. [name] on 12/23/15 to discuss PET results.  ALLERGIES:  is allergic to codeine.  Meds: Current Outpatient Prescriptions  Medication Sig Dispense Refill  . amLODipine (NORVASC) 5 MG tablet Take 1 tablet (5 mg total) by mouth every morning. 90 tablet 3  . aspirin EC 81 MG tablet Take 81 mg by mouth daily.     . brimonidine-timolol (COMBIGAN) 0.2-0.5 % ophthalmic solution Place 1 drop into both  eyes every 12 (twelve) hours.    . finasteride ([name]) 5 MG tablet Take 5 mg by mouth daily.    . fluticasone (FLONASE) 50 MCG/ACT nasal spray Place 2 sprays into both nostrils daily as needed for allergies or rhinitis. 48 g 3  . folic acid (FOLVITE) 1 MG tablet Take 1 tablet (1 mg total) by mouth daily. 30 tablet 2  . lactulose (CHRONULAC) 10 GM/15ML solution Take 10 g by mouth 2 (two) times daily as needed (constipation).     [name] levothyroxine (SYNTHROID, LEVOTHROID) 100 MCG tablet Take 1 tablet (100 mcg total) by mouth daily. 45 tablet 2  . Linaclotide (LINZESS) 290 MCG CAPS capsule Take 1 capsule (290 mcg total) by mouth daily. 30 capsule 3  . meloxicam (MOBIC) 7.5 MG tablet Take 1 tablet (7.5 mg total) by mouth daily. 30 tablet 1  . omeprazole (PRILOSEC) 20 MG capsule Take 1 capsule (20 mg total) by mouth every morning. 90 capsule 0  . polyethylene glycol (MIRALAX / GLYCOLAX) packet Take 17 g by mouth daily as needed (constipation). Reported on 06/21/2015    . sildenafil (VIAGRA) 100 MG tablet Take 1 tablet (100 mg total) by mouth as directed. 10 tablet 0  . simvastatin (ZOCOR) 20 MG tablet Take 1 tablet (20 mg total) by mouth at bedtime. 90 tablet 3  . tamsulosin (FLOMAX) 0.4 MG CAPS capsule Take 2 capsules (0.8 mg total) by mouth at bedtime. 90 capsule 3  . traMADol (ULTRAM) 50 MG tablet Take  1 tablet (50 mg total) by mouth every 6 (six) hours as needed for moderate pain. 30 tablet 0  . chlorproMAZINE (THORAZINE) 25 MG tablet Take 1 tablet (25 mg total) by mouth 3 (three) times daily as needed for hiccoughs. (Patient not taking: Reported on 12/15/2015) 30 tablet 0  . dexamethasone (DECADRON) 4 MG tablet 4 mg by mouth twice a day the day before, day of and day after the chemotherapy every 3 weeks. (Patient not taking: Reported on 12/15/2015) 40 tablet 0  . prochlorperazine (COMPAZINE) 10 MG tablet Take 1 tablet (10 mg total) by mouth every 6 (six) hours as needed for nausea or vomiting. (Patient  not taking: Reported on 12/15/2015) 30 tablet 0  . sucralfate (CARAFATE) 1 g tablet Take 1 tablet (1 g total) by mouth 4 (four) times daily -  with meals and at bedtime. Mix in 2Tbs water, swish and swallow (Patient not taking: Reported on 12/15/2015) 60 tablet 0   No current facility-administered medications for this encounter.   Physical Findings: The patient is in no acute distress. Patient is alert and oriented.  height is '5\' 6"'$  (1.676 m) and weight is 155 lb 12.8 oz (70.67 kg). His oral temperature is 97.5 F (36.4 C). His blood pressure is 106/70 and his pulse is 112. His oxygen saturation is 99%. .  No significant changes. No palpable cervical, supraclavicular or axillary lymphoadenopathy. The heart has a regular rate and rhythm. The lungs are clear to auscultation.   Lab Findings: Lab Results  Component Value Date   WBC 2.0* 12/08/2015   HGB 12.8* 12/08/2015   HCT 37.0* 12/08/2015   MCV 89.4 12/08/2015   PLT 201 12/08/2015    Radiographic Findings: Ct Chest W Contrast  12/01/2015  CLINICAL DATA:  Re-stage right upper lobe stage IIIA (T1b N2 M0) lung adenocarcinoma diagnosed November 2016, status post concurrent chemoradiation therapy and consolidation chemotherapy. EXAM: CT CHEST WITH CONTRAST TECHNIQUE: Multidetector CT imaging of the chest was performed during intravenous contrast administration. CONTRAST:  47m ISOVUE-300 IOPAMIDOL (ISOVUE-300) INJECTION 61% COMPARISON:  09/05/2015 chest CT. FINDINGS: Mediastinum/Nodes: Normal heart size. No pericardial fluid/thickening. Atherosclerotic nonaneurysmal thoracic aorta. Normal caliber pulmonary arteries. No central pulmonary emboli. Atrophic appearing thyroid. Normal esophagus. No pathologically enlarged axillary, mediastinal or hilar lymph nodes. Lungs/Pleura: No pneumothorax. No pleural effusion. Posterior right upper lobe irregular 2.0 x 1.2 cm nodule (series 5/ image 32), slightly decreased from 2.1 x 1.3 cm on 09/05/2015. There are 3  new solid pulmonary nodules in the posterior right upper lobe, largest 1.2 x 0.6 cm (average diameter 0.9 cm, series 5/ image 51) and 0.7 cm (series 5/ image 57). There is a new 0.5 cm subpleural nodule in the anterior left upper lobe (series 5/image 78). There is new focal mild peribronchovascular interstitial thickening in the basilar right upper lobe (series 5/image 64). Previously described additional tiny scattered pulmonary nodules in both lungs measuring up to 0.4 cm (series 5/image 75) are stable. Ground-glass 1.0 x 0.6 cm apical left upper lobe pulmonary nodule (series 5/image 25) is stable. Mild centrilobular emphysema. Upper abdomen: Simple 1.5 cm posterior upper right renal cyst. Additional subcentimeter hypodense renal cortical lesions in the upper right kidney are too small to characterize and appear unchanged. Stable mild irregular thickening of both adrenal glands without discrete adrenal nodules. Musculoskeletal: No aggressive appearing focal osseous lesions. Mild degenerative changes in the thoracic spine. Stable prominent asymmetric left gynecomastia. IMPRESSION: 1. Primary right upper lobe tumor is slightly decreased in size. 2.  Three new solid right upper lobe pulmonary nodules measuring up to 0.9 cm and solitary new 0.5 cm left upper lobe pulmonary nodule. These nodules are indeterminate but worrisome for metastatic disease. 3. New focus of peribronchovascular interstitial thickening in the basilar right upper lobe, favor inflammatory etiology. 4. Previously described pulmonary nodules are stable. 5. No thoracic adenopathy. 6. Stable prominently asymmetric left gynecomastia. Electronically Signed   By: Ilona Sorrel M.D.   On: 12/01/2015 10:45   Mr Lumbar Spine W Wo Contrast  12/06/2015  CLINICAL DATA:  Bilateral hip, thigh and groin pain for 1 month. History of lung carcinoma. Question metastatic disease. EXAM: MRI LUMBAR SPINE WITHOUT AND WITH CONTRAST TECHNIQUE: Multiplanar and multiecho  pulse sequences of the lumbar spine were obtained without and with intravenous contrast. CONTRAST:  15 ml MULTIHANCE GADOBENATE DIMEGLUMINE 529 MG/ML IV SOLN COMPARISON:  PET CT scan 05/19/2015. FINDINGS: Segmentation:  Unremarkable. Alignment: Facet degenerative change results in 0.3 cm anterolisthesis L4 on L5 and trace anterolisthesis L5 on S1. Vertebrae: Vertebral body height is maintained. No marrow signal abnormality to suggest metastatic disease is identified. Conus medullaris: Extends to the L1-2 level and appears normal. Paraspinal and other soft tissues: Small right renal cyst is noted. Disc levels: T11-12 and T12-L1 are imaged in the sagittal plane only and negative. L1-2:  Mild facet degenerative change.  Otherwise negative. L2-3:  Negative. L3-4: A left foraminal disc protrusion impinges on the exiting left L3 root. The central canal and right foramen are open. Mild facet degenerative change is seen. L4-5: Bilateral facet degenerative disease is worse on the left where there is some marrow edema about the facet. The disc is uncovered. The central canal and foramina are open. L5-S1: Right worse than left facet degenerative disease is identified. Minimal disc bulge without central canal or foraminal stenosis is seen. IMPRESSION: Negative for metastatic disease. Left foraminal protrusion at L3-4 impinges on the exiting left L3 root. Facet degenerative change appears worst on the left at L4-5 where there is some marrow edema about the joint. Electronically Signed   By: Inge Rise M.D.   On: 12/06/2015 08:53   Impression:  The patient is recovering well from the effects of radiation. His recent chest CT shows concerning nodules in the right and left lung, upper lobe. This will be further worked up with PET scan scheduled for 12/19/15  Plan: Proceed with PET scan, follow up with Dr.Mohamed, PRN in radiation oncology. PT for his lower back/hips.  -----------------------------------  Blair Promise, PhD, MD  This document serves as a record of services personally performed by Gery Pray, MD. It was created on his behalf by Derek Mound, a trained medical scribe. The creation of this record is based on the scribe's personal observations and the provider's statements to them. This document has been checked and approved by the attending provider.

## 2015-12-19 ENCOUNTER — Telehealth: Payer: Self-pay | Admitting: Internal Medicine

## 2015-12-19 ENCOUNTER — Ambulatory Visit (HOSPITAL_COMMUNITY)
Admission: RE | Admit: 2015-12-19 | Discharge: 2015-12-19 | Disposition: A | Discharge status: Home / Self Care | Payer: Medicare HMO | Source: Ambulatory Visit | Attending: Internal Medicine | Admitting: Internal Medicine

## 2015-12-19 DIAGNOSIS — R911 Solitary pulmonary nodule: Secondary | ICD-10-CM | POA: Insufficient documentation

## 2015-12-19 DIAGNOSIS — C7951 Secondary malignant neoplasm of bone: Secondary | ICD-10-CM | POA: Diagnosis not present

## 2015-12-19 DIAGNOSIS — Z5111 Encounter for antineoplastic chemotherapy: Secondary | ICD-10-CM | POA: Diagnosis not present

## 2015-12-19 DIAGNOSIS — M25552 Pain in left hip: Secondary | ICD-10-CM | POA: Insufficient documentation

## 2015-12-19 DIAGNOSIS — C3411 Malignant neoplasm of upper lobe, right bronchus or lung: Secondary | ICD-10-CM

## 2015-12-19 DIAGNOSIS — Z9221 Personal history of antineoplastic chemotherapy: Secondary | ICD-10-CM | POA: Insufficient documentation

## 2015-12-19 LAB — GLUCOSE, CAPILLARY: Glucose-Capillary: 97 mg/dL (ref 65–99)

## 2015-12-19 MED ORDER — FLUDEOXYGLUCOSE F - 18 (FDG) INJECTION
7.3500 | Freq: Once | INTRAVENOUS | Status: AC | PRN
  Administered 2015-12-19: 7.35 via INTRAVENOUS

## 2015-12-19 NOTE — Telephone Encounter (Signed)
Faxed pt records to [facility] healthcare [phone]

## 2015-12-23 ENCOUNTER — Telehealth: Payer: Self-pay | Admitting: *Deleted

## 2015-12-23 ENCOUNTER — Ambulatory Visit (HOSPITAL_BASED_OUTPATIENT_CLINIC_OR_DEPARTMENT_OTHER): Payer: Medicare HMO | Admitting: Internal Medicine

## 2015-12-23 ENCOUNTER — Encounter: Payer: Self-pay | Admitting: Internal Medicine

## 2015-12-23 ENCOUNTER — Telehealth: Payer: Self-pay | Admitting: Nurse Practitioner

## 2015-12-23 VITALS — BP 128/73 | HR 109 | Temp 97.7°F | Resp 18 | Ht 66.0 in | Wt 151.8 lb

## 2015-12-23 DIAGNOSIS — M545 Low back pain: Secondary | ICD-10-CM

## 2015-12-23 DIAGNOSIS — C3411 Malignant neoplasm of upper lobe, right bronchus or lung: Secondary | ICD-10-CM

## 2015-12-23 DIAGNOSIS — R5382 Chronic fatigue, unspecified: Secondary | ICD-10-CM | POA: Diagnosis not present

## 2015-12-23 DIAGNOSIS — Z5112 Encounter for antineoplastic immunotherapy: Secondary | ICD-10-CM

## 2015-12-23 DIAGNOSIS — K5900 Constipation, unspecified: Secondary | ICD-10-CM

## 2015-12-23 DIAGNOSIS — M25559 Pain in unspecified hip: Secondary | ICD-10-CM

## 2015-12-23 HISTORY — DX: Chronic fatigue, unspecified: R53.82

## 2015-12-23 HISTORY — DX: Encounter for antineoplastic immunotherapy: Z51.12

## 2015-12-23 MED ORDER — TRAMADOL HCL 50 MG PO TABS: 50.0000 mg | ORAL_TABLET | Freq: Four times a day (QID) | ORAL | Status: DC | PRN

## 2015-12-23 NOTE — Congregational Nurse Program (Signed)
Congregational Nurse Program Note  Date of Encounter: 12/23/2015  Past Medical History: Past Medical History  Diagnosis Date  . Sleep apnea     does not use cpap  . Unspecified essential hypertension   . Hypercholesteremia   . GERD (gastroesophageal reflux disease)   . Diverticulosis of colon (without mention of hemorrhage)   . Irritable bowel syndrome   . Benign neoplasm of colon     colon polyps  . Benign hypertrophy of prostate   . Erectile dysfunction   . Restless legs syndrome (RLS)   . Anxiety state, unspecified   . Onychomycosis   . Constipation, chronic   . Hypothyroidism   . Cancer [name])     prostate cancer  . Glaucoma   . Radiation 06/20/15-08/02/15    right upper lung region 60 gray  . Encounter for antineoplastic chemotherapy 09/29/2015  . Left hip pain 10/27/2015  . Chronic fatigue 12/23/2015  . Encounter for antineoplastic immunotherapy 12/23/2015    Encounter Details:     CNP Questionnaire - 12/23/15 2305    Patient Demographics   Is this a new or existing patient? Existing   Patient is considered a/an Not Applicable   Race African-American/Black   Patient Assistance   Location of Patient Assistance [employer]   Patient's financial/insurance status Low Income;Medicare   Uninsured Patient No   Patient referred to apply for the following financial assistance Not Applicable   Food insecurities addressed Not Applicable   Transportation assistance No   Assistance securing medications No   Educational health offerings Cancer;Safety;Health literacy;Nutrition;Spiritual care   Encounter Details   Primary purpose of visit Education/Health Concerns;Safety;Chronic Illness/Condition Visit;Spiritual Care/Support Visit;Post PCP Visit   Was an Emergency Department visit averted? No   Does patient have a medical provider? Yes   Patient referred to Not Applicable   Was a mental health screening completed? (GAINS tool) No   Does patient have dental issues? No   Does patient have vision issues? Yes   Was a vision referral made? No   Does your patient have an abnormal blood pressure today? No   Since previous encounter, have you referred patient for abnormal blood pressure that resulted in a new diagnosis or medication change? No   Does your patient have an abnormal blood glucose today? No   Since previous encounter, have you referred patient for abnormal blood glucose that resulted in a new diagnosis or medication change? No   Was there a life-saving intervention made? No       Client and spouse reported today that client has a spot on his pevic bone that is suspicious and that they want him to have more chemo.  Will be lower dose and fewer treatments. Client agreeable to pursue same.  He feels he is in good enough physical and mental place to continue.  Discussed concerns about cost, but has been told they are putting him on a new program as this is a new therapy.  He is taking physical therapy and has been told some of issue with pain in his hips is arthritis and now reports no pain.  Still having tiredness and some pain .  Used a wheel chair to roll into [employer].  Client reports his dr has sent him to [facility] to get his medication.  Currently trying to do less at church and let others handle things.  Will followup on Sunday.

## 2015-12-23 NOTE — Progress Notes (Signed)
[facility] [phone]   [phone]) [phone]  OFFICE PROGRESS NOTE  Alan Space, MD [place] [address]  DIAGNOSIS: Metastatic non-small cell lung cancer initially diagnosed as Stage IIIA (T1b, N2, M0) non-small cell lung cancer, adenocarcinoma presented with right right upper lobe nodule in addition to mediastinal lymphadenopathy diagnosed in November 2016.  PRIOR THERAPY:  1) Concurrent chemoradiation with weekly carboplatin for AUC of 2 and paclitaxel 45 MG/M2. Status post 5 cycles. Last dose was given 07/18/2015 with partial response. 2) Consolidation chemotherapy with carboplatin for AUC of 5 and Alimta 500 MG/M2 every 3 weeks. First dose 10/06/2015. Status post 3 cycles.  CURRENT THERAPY: Immunotherapy with Nivolumab 240 MG IV every 2 weeks. First dose 12/28/2015.  INTERVAL HISTORY: Alan [name] 75 y.o. male returns to the clinic today for follow-up visit accompanied by his wife. The patient is feeling fine today with no specific complaints except for the persistent low back pain. He is currently undergoing physical therapy. He is also on tramadol as needed for pain management. He denied having any significant nausea or vomiting but continues to have constipation and he is currently on MiraLAX and Linzess by his family doctor. He is scheduled to see a gastroenterologist for reevaluation. He has no fever or chills. He denied having any significant chest pain, shortness of breath, cough or hemoptysis. He has no significant weight loss or night sweats. He was found on previous CT scan of the chest to have new pulmonary nodules and I ordered a PET scan for further evaluation of this lesion. The patient is here today for discussion of his scan results and treatment options.  MEDICAL HISTORY: Past Medical History  Diagnosis Date  . Sleep apnea     does not use cpap  . Unspecified essential hypertension   . Hypercholesteremia   . GERD  (gastroesophageal reflux disease)   . Diverticulosis of colon (without mention of hemorrhage)   . Irritable bowel syndrome   . Benign neoplasm of colon     colon polyps  . Benign hypertrophy of prostate   . Erectile dysfunction   . Restless legs syndrome (RLS)   . Anxiety state, unspecified   . Onychomycosis   . Constipation, chronic   . Hypothyroidism   . Cancer [facility])     prostate cancer  . Glaucoma   . Radiation 06/20/15-08/02/15    right upper lung region 60 gray  . Encounter for antineoplastic chemotherapy 09/29/2015  . Left hip pain 10/27/2015    ALLERGIES:  is allergic to codeine.  MEDICATIONS:  Current Outpatient Prescriptions  Medication Sig Dispense Refill  . amLODipine (NORVASC) 5 MG tablet Take 1 tablet (5 mg total) by mouth every morning. 90 tablet 3  . aspirin EC 81 MG tablet Take 81 mg by mouth daily.     . brimonidine-timolol (COMBIGAN) 0.2-0.5 % ophthalmic solution Place 1 drop into both eyes every 12 (twelve) hours.    . chlorproMAZINE (THORAZINE) 25 MG tablet Take 1 tablet (25 mg total) by mouth 3 (three) times daily as needed for hiccoughs. (Patient not taking: Reported on 12/15/2015) 30 tablet 0  . dexamethasone (DECADRON) 4 MG tablet 4 mg by mouth twice a day the day before, day of and day after the chemotherapy every 3 weeks. (Patient not taking: Reported on 12/15/2015) 40 tablet 0  . finasteride ([name]) 5 MG tablet TAKE 1 TABLET BY MOUTH EVERY DAY 30 tablet 1  . fluticasone (FLONASE)  50 MCG/ACT nasal spray Place 2 sprays into both nostrils daily as needed for allergies or rhinitis. 48 g 3  . folic acid (FOLVITE) 1 MG tablet Take 1 tablet (1 mg total) by mouth daily. 30 tablet 2  . lactulose (CHRONULAC) 10 GM/15ML solution Take 10 g by mouth 2 (two) times daily as needed (constipation).     [name] levothyroxine (SYNTHROID, LEVOTHROID) 100 MCG tablet Take 1 tablet (100 mcg total) by mouth daily. 45 tablet 2  . Linaclotide (LINZESS) 290 MCG CAPS capsule Take 1 capsule  (290 mcg total) by mouth daily. 30 capsule 3  . meloxicam (MOBIC) 7.5 MG tablet Take 1 tablet (7.5 mg total) by mouth daily. 30 tablet 1  . omeprazole (PRILOSEC) 20 MG capsule Take 1 capsule (20 mg total) by mouth every morning. 90 capsule 0  . polyethylene glycol (MIRALAX / GLYCOLAX) packet Take 17 g by mouth daily as needed (constipation). Reported on 06/21/2015    . prochlorperazine (COMPAZINE) 10 MG tablet Take 1 tablet (10 mg total) by mouth every 6 (six) hours as needed for nausea or vomiting. (Patient not taking: Reported on 12/15/2015) 30 tablet 0  . sildenafil (VIAGRA) 100 MG tablet Take 1 tablet (100 mg total) by mouth as directed. 10 tablet 0  . simvastatin (ZOCOR) 20 MG tablet Take 1 tablet (20 mg total) by mouth at bedtime. 90 tablet 3  . sucralfate (CARAFATE) 1 g tablet Take 1 tablet (1 g total) by mouth 4 (four) times daily -  with meals and at bedtime. Mix in 2Tbs water, swish and swallow (Patient not taking: Reported on 12/15/2015) 60 tablet 0  . tamsulosin (FLOMAX) 0.4 MG CAPS capsule Take 2 capsules (0.8 mg total) by mouth at bedtime. 90 capsule 3  . traMADol (ULTRAM) 50 MG tablet Take 1 tablet (50 mg total) by mouth every 6 (six) hours as needed for moderate pain. 30 tablet 0   No current facility-administered medications for this visit.    SURGICAL HISTORY:  Past Surgical History  Procedure Laterality Date  . None    . Colonoscopy    . Laparoscopic appendectomy N/A 01/27/2013    Procedure: APPENDECTOMY LAPAROSCOPIC;  Surgeon: Edward Jolly, MD;  Location: [facility]S;  Service: General;  Laterality: N/A;  . Appendectomy  12/2012  . Thyroidectomy    . Tonsillectomy    . Cardiac catheterization  ? 2008  . Video bronchoscopy N/A 05/31/2015    Procedure: VIDEO BRONCHOSCOPY;  Surgeon: Gaye Pollack, MD;  Location[facility];  Service: Thoracic;  Laterality: N/A;  . Mediastinoscopy N/A 05/31/2015    Procedure: MEDIASTINOSCOPY;  Surgeon: Gaye Pollack, MD;  Location: [facility] OR;   Service: Thoracic;  Laterality: N/A;    REVIEW OF SYSTEMS:  Constitutional: positive for fatigue Eyes: negative Ears, nose, mouth, throat, and face: negative Respiratory: negative Cardiovascular: negative Gastrointestinal: negative Genitourinary:negative Integument/breast: negative Hematologic/lymphatic: negative Musculoskeletal:positive for arthralgias and back pain Neurological: negative Behavioral/Psych: negative Endocrine: negative Allergic/Immunologic: negative   PHYSICAL EXAMINATION: General appearance: alert, cooperative, fatigued and no distress Head: Normocephalic, without obvious abnormality, atraumatic Neck: no adenopathy, no JVD, supple, symmetrical, trachea midline and thyroid not enlarged, symmetric, no tenderness/mass/nodules Lymph nodes: Cervical, supraclavicular, and axillary nodes normal. Resp: clear to auscultation bilaterally Back: symmetric, no curvature. ROM normal. No CVA tenderness. Cardio: regular rate and rhythm, S1, S2 normal, no murmur, click, rub or gallop GI: soft, non-tender; bowel sounds normal; no masses,  no organomegaly Extremities: extremities normal, atraumatic, no cyanosis or edema Neurologic:  Alert and oriented X 3, normal strength and tone. Normal symmetric reflexes. Normal coordination and gait  ECOG PERFORMANCE STATUS: 1 - Symptomatic but completely ambulatory  Blood pressure 128/73, pulse 109, temperature 97.7 F (36.5 C), temperature source Oral, resp. rate 18, height '5\' 6"'$  (1.676 m), weight 151 lb 12.8 oz (68.856 kg), SpO2 99 %.  LABORATORY DATA: Lab Results  Component Value Date   WBC 2.0* 12/08/2015   HGB 12.8* 12/08/2015   HCT 37.0* 12/08/2015   MCV 89.4 12/08/2015   PLT 201 12/08/2015      Chemistry      Component Value Date/Time   NA 139 12/08/2015 0834   NA 142 05/31/2015 0645   K 3.6 12/08/2015 0834   K 3.7 05/31/2015 0645   CL 110 05/31/2015 0645   CO2 23 12/08/2015 0834   CO2 25 05/31/2015 0645   BUN 11.5  12/08/2015 0834   BUN 8 05/31/2015 0645   CREATININE 1.0 12/08/2015 0834   CREATININE 1.03 05/31/2015 0645      Component Value Date/Time   CALCIUM 9.7 12/08/2015 0834   CALCIUM 9.1 05/31/2015 0645   ALKPHOS 53 12/08/2015 0834   ALKPHOS 42 05/31/2015 0645   AST 14 12/08/2015 0834   AST 15 05/31/2015 0645   ALT <9 12/08/2015 0834   ALT 17 05/31/2015 0645   BILITOT 0.35 12/08/2015 0834   BILITOT 0.4 05/31/2015 0645       RADIOGRAPHIC STUDIES: Ct Chest W Contrast  12/01/2015  CLINICAL DATA:  Re-stage right upper lobe stage IIIA (T1b N2 M0) lung adenocarcinoma diagnosed November 2016, status post concurrent chemoradiation therapy and consolidation chemotherapy. EXAM: CT CHEST WITH CONTRAST TECHNIQUE: Multidetector CT imaging of the chest was performed during intravenous contrast administration. CONTRAST:  32m ISOVUE-300 IOPAMIDOL (ISOVUE-300) INJECTION 61% COMPARISON:  09/05/2015 chest CT. FINDINGS: Mediastinum/Nodes: Normal heart size. No pericardial fluid/thickening. Atherosclerotic nonaneurysmal thoracic aorta. Normal caliber pulmonary arteries. No central pulmonary emboli. Atrophic appearing thyroid. Normal esophagus. No pathologically enlarged axillary, mediastinal or hilar lymph nodes. Lungs/Pleura: No pneumothorax. No pleural effusion. Posterior right upper lobe irregular 2.0 x 1.2 cm nodule (series 5/ image 32), slightly decreased from 2.1 x 1.3 cm on 09/05/2015. There are 3 new solid pulmonary nodules in the posterior right upper lobe, largest 1.2 x 0.6 cm (average diameter 0.9 cm, series 5/ image 51) and 0.7 cm (series 5/ image 57). There is a new 0.5 cm subpleural nodule in the anterior left upper lobe (series 5/image 78). There is new focal mild peribronchovascular interstitial thickening in the basilar right upper lobe (series 5/image 64). Previously described additional tiny scattered pulmonary nodules in both lungs measuring up to 0.4 cm (series 5/image 75) are stable. Ground-glass  1.0 x 0.6 cm apical left upper lobe pulmonary nodule (series 5/image 25) is stable. Mild centrilobular emphysema. Upper abdomen: Simple 1.5 cm posterior upper right renal cyst. Additional subcentimeter hypodense renal cortical lesions in the upper right kidney are too small to characterize and appear unchanged. Stable mild irregular thickening of both adrenal glands without discrete adrenal nodules. Musculoskeletal: No aggressive appearing focal osseous lesions. Mild degenerative changes in the thoracic spine. Stable prominent asymmetric left gynecomastia. IMPRESSION: 1. Primary right upper lobe tumor is slightly decreased in size. 2. Three new solid right upper lobe pulmonary nodules measuring up to 0.9 cm and solitary new 0.5 cm left upper lobe pulmonary nodule. These nodules are indeterminate but worrisome for metastatic disease. 3. New focus of peribronchovascular interstitial thickening in the basilar right upper lobe,  favor inflammatory etiology. 4. Previously described pulmonary nodules are stable. 5. No thoracic adenopathy. 6. Stable prominently asymmetric left gynecomastia. Electronically Signed   By: Ilona Sorrel M.D.   On: 12/01/2015 10:45   Mr Lumbar Spine W Wo Contrast  12/06/2015  CLINICAL DATA:  Bilateral hip, thigh and groin pain for 1 month. History of lung carcinoma. Question metastatic disease. EXAM: MRI LUMBAR SPINE WITHOUT AND WITH CONTRAST TECHNIQUE: Multiplanar and multiecho pulse sequences of the lumbar spine were obtained without and with intravenous contrast. CONTRAST:  15 ml MULTIHANCE GADOBENATE DIMEGLUMINE 529 MG/ML IV SOLN COMPARISON:  PET CT scan 05/19/2015. FINDINGS: Segmentation:  Unremarkable. Alignment: Facet degenerative change results in 0.3 cm anterolisthesis L4 on L5 and trace anterolisthesis L5 on S1. Vertebrae: Vertebral body height is maintained. No marrow signal abnormality to suggest metastatic disease is identified. Conus medullaris: Extends to the L1-2 level and  appears normal. Paraspinal and other soft tissues: Small right renal cyst is noted. Disc levels: T11-12 and T12-L1 are imaged in the sagittal plane only and negative. L1-2:  Mild facet degenerative change.  Otherwise negative. L2-3:  Negative. L3-4: A left foraminal disc protrusion impinges on the exiting left L3 root. The central canal and right foramen are open. Mild facet degenerative change is seen. L4-5: Bilateral facet degenerative disease is worse on the left where there is some marrow edema about the facet. The disc is uncovered. The central canal and foramina are open. L5-S1: Right worse than left facet degenerative disease is identified. Minimal disc bulge without central canal or foraminal stenosis is seen. IMPRESSION: Negative for metastatic disease. Left foraminal protrusion at L3-4 impinges on the exiting left L3 root. Facet degenerative change appears worst on the left at L4-5 where there is some marrow edema about the joint. Electronically Signed   By: Inge Rise M.D.   On: 12/06/2015 08:53   Nm Pet Image Initial (pi) Skull Base To Thigh  12/19/2015  CLINICAL DATA:  Subsequent treatment strategy for RIGHT upper lobe of lung carcinoma. Stage IIIA non-small-cell lung cancer of RIGHT upper lobe. Weekly chemo radiation therapy. EXAM: NUCLEAR MEDICINE PET SKULL BASE TO THIGH TECHNIQUE: 7.0 mCi F-18 FDG was injected intravenously. Full-ring PET imaging was performed from the skull base to thigh after the radiotracer. CT data was obtained and used for attenuation correction and anatomic localization. FASTING BLOOD GLUCOSE:  Value: 97 mg/dl COMPARISON:  CT 05/19/2015 FINDINGS: NECK No hypermetabolic lymph nodes in the neck. CHEST RIGHT upper lobe mass is decreased in size and metabolic activity measuring 1.9 cm decreased from 2.6 cm with SUV max equal 8.8 decreased from 12.3. There is a new reticular pattern in the RIGHT upper lobe and RIGHT lower lobe which is felt to represent radiation change.  This reticular pattern is hypermetabolic. Within the peripheral aspect of the RIGHT upper lobe at the margin of the reticular change there is a new nodule measuring 1.1 cm (image 66, series 4) with metabolic activity (SUV max equal 5.6). There is a second adjacent nodule measuring 0.9 cm image 68, series 4. Within the mediastinum there is marked reduction and size and metabolic activity of RIGHT paratracheal lymph node which now measures only 3 mm short axis. There is symmetric hilar activity similar to prior. New layering RIGHT pleural effusion. ABDOMEN/PELVIS No abnormal metabolic activity in the liver. There is mild metabolic activity associated with the LEFT and RIGHT adrenal gland similar prior. No mass lesion. No hypermetabolic lymph nodes in the abdomen or pelvis. SKELETON New  lytic lesion in the anterior aspect of the T9 vertebral body with associated intense metabolic activity (SUV max 7.0). Subtle metabolic activity at this same site on comparison PET-CT scan without CT change at that time. IMPRESSION: 1. Interval reduction in size in metabolic activity of of dominant RIGHT upper lobe pulmonary nodule. 2. Interval resolution of metabolic activity and reduction in size of RIGHT paratracheal lymph node. 3. New hypermetabolic nodule within the RIGHT upper lobe along the border of the reticular lung parenchymal change is concerning for a metastatic nodule along the margin of the radiation field. 4. 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 Electronically Signed   By: Genevive Bi M.D.   On: 12/19/2015 09:28    ASSESSMENT AND PLAN: This is a very pleasant 75 years old African-American male recently diagnosed with a stage IIIA non-small cell lung cancer, adenocarcinoma with negative EGFR mutation, negative ALK gene translocation and negative ROS 1. He completed a course of concurrent chemoradiation with weekly carboplatin and paclitaxel status post 5 cycles. The patient  tolerated his treatment fairly well. The recent CT scan of the chest showed partial response in the primary left upper lobe neoplasm with no residual thoracic adenopathy. He was seen by Dr. Laneta Simmers for consideration of surgical resection but the patient became apprehensive about surgery after discussion of the risk and benefits of the procedure. He is currently undergoing consolidation chemotherapy with carboplatin and Alimta status post 3 cycles. He tolerated his treatment fairly well. The recent CT scan of the chest showed improvement of the primary right upper lobe tumor but there was seen you solid right upper lobe pulmonary nodules suspicious for metastasis.  The recent PET scan showed reduction in the size and metabolic activity of the dominant right upper lobe pulmonary nodule as well as resolution of the metabolic activity and reduction in the size of the right paratracheal lymph node but unfortunately there was new hypermetabolic nodule within the right upper lobe concerning for a metastatic nodule in addition to a solitary hypermetabolic skeletal metastasis at the T9 vertebral body. I discussed the scan results and showed the images to the patient and his wife. I recommended for the patient to consider second line treatment with immunotherapy with Nivolumab 240 MG IV every 2 weeks. I'll also give him the option of palliative care or second line chemotherapy. The patient is interested in proceeding with the immunotherapy. He is expected to start the first cycle of this treatment on 12/28/2015. I discussed with the patient adverse effect of this treatment including but not limited to immune mediated the skin rash, diarrhea, inflammation of the lung, liver, knee as well as thyroid or other endocrine dysfunction. He would like to proceed with the treatment as planned. He would come back for follow-up visit in 3 weeks with the start of cycle #2 then every 2 weeks with the immunotherapy. For the low  back pain, the patient is currently undergoing physical therapy and evaluation by orthopedic surgery. I gave him a refill of tramadol today. For the constipation, the patient will continue with MiraLAX and Linzess for now and he scheduled to see a gastroenterologist for further evaluation. The patient was advised to call immediately if he has any concerning symptoms in the interval. The patient voices understanding of current disease status and treatment options and is in agreement with the current care plan.  All questions were answered. The patient knows to call the clinic with any problems, questions or concerns. We can certainly  see the patient much sooner if necessary.  Disclaimer: This note was dictated with voice recognition software. Similar sounding words can inadvertently be transcribed and may not be corrected upon review.

## 2015-12-23 NOTE — Telephone Encounter (Signed)
Gave pt cal & avs °

## 2015-12-23 NOTE — Telephone Encounter (Signed)
Per staff message and POF I have scheduled appts. Advised scheduler of appts. [name]  

## 2015-12-25 DIAGNOSIS — M25559 Pain in unspecified hip: Secondary | ICD-10-CM

## 2015-12-28 ENCOUNTER — Ambulatory Visit (HOSPITAL_BASED_OUTPATIENT_CLINIC_OR_DEPARTMENT_OTHER): Payer: Medicare HMO

## 2015-12-28 ENCOUNTER — Other Ambulatory Visit: Payer: Self-pay | Admitting: Medical Oncology

## 2015-12-28 VITALS — BP 124/69 | HR 109 | Temp 97.7°F | Resp 16

## 2015-12-28 DIAGNOSIS — Z5112 Encounter for antineoplastic immunotherapy: Secondary | ICD-10-CM

## 2015-12-28 DIAGNOSIS — C3411 Malignant neoplasm of upper lobe, right bronchus or lung: Secondary | ICD-10-CM

## 2015-12-28 LAB — CBC WITH DIFFERENTIAL/PLATELET
BASO%: 0.3 % (ref 0.0–2.0)
Basophils Absolute: 0 10*3/uL (ref 0.0–0.1)
EOS%: 0.3 % (ref 0.0–7.0)
Eosinophils Absolute: 0 10*3/uL (ref 0.0–0.5)
HCT: 36.4 % — ABNORMAL LOW (ref 38.4–49.9)
HGB: 12.3 g/dL — ABNORMAL LOW (ref 13.0–17.1)
LYMPH%: 17 % (ref 14.0–49.0)
MCH: 30.3 pg (ref 27.2–33.4)
MCHC: 33.8 g/dL (ref 32.0–36.0)
MCV: 89.7 fL (ref 79.3–98.0)
MONO#: 0.4 10*3/uL (ref 0.1–0.9)
MONO%: 10.5 % (ref 0.0–14.0)
NEUT%: 71.9 % (ref 39.0–75.0)
NEUTROS ABS: 2.7 10*3/uL (ref 1.5–6.5)
PLATELETS: 290 10*3/uL (ref 140–400)
RBC: 4.06 10*6/uL — AB (ref 4.20–5.82)
RDW: 15.5 % — ABNORMAL HIGH (ref 11.0–14.6)
WBC: 3.7 10*3/uL — AB (ref 4.0–10.3)
lymph#: 0.6 10*3/uL — ABNORMAL LOW (ref 0.9–3.3)

## 2015-12-28 LAB — COMPREHENSIVE METABOLIC PANEL
ALT: 10 U/L (ref 0–55)
ANION GAP: 10 meq/L (ref 3–11)
AST: 14 U/L (ref 5–34)
Albumin: 3.3 g/dL — ABNORMAL LOW (ref 3.5–5.0)
Alkaline Phosphatase: 68 U/L (ref 40–150)
BUN: 9.3 mg/dL (ref 7.0–26.0)
CHLORIDE: 106 meq/L (ref 98–109)
CO2: 24 meq/L (ref 22–29)
CREATININE: 1 mg/dL (ref 0.7–1.3)
Calcium: 9.9 mg/dL (ref 8.4–10.4)
EGFR: 86 mL/min/{1.73_m2} — ABNORMAL LOW (ref >90)
GLUCOSE: 151 mg/dL — AB (ref 70–140)
Potassium: 3.7 mEq/L (ref 3.5–5.1)
SODIUM: 140 meq/L (ref 136–145)
Total Bilirubin: 0.36 mg/dL (ref 0.20–1.20)
Total Protein: 6.8 g/dL (ref 6.4–8.3)

## 2015-12-28 MED ORDER — SODIUM CHLORIDE 0.9 % IV SOLN
240.0000 mg | Freq: Once | INTRAVENOUS | Status: AC
  Administered 2015-12-28: 240 mg via INTRAVENOUS
  Filled 2015-12-28: qty 20

## 2015-12-28 MED ORDER — SODIUM CHLORIDE 0.9 % IV SOLN
Freq: Once | INTRAVENOUS | Status: AC
  Administered 2015-12-28: 09:00:00 via INTRAVENOUS

## 2015-12-28 NOTE — Progress Notes (Signed)
Per [name] it is okay to treat pt today with nivliumab and cmet from 6/8

## 2015-12-28 NOTE — Patient Instructions (Addendum)
[facility] Discharge Instructions for Patients Receiving Chemotherapy  Today you received the following chemotherapy agents:  nivolumab.  To help prevent nausea and vomiting after your treatment, we encourage you to take your nausea medication as directed.   If you develop nausea and vomiting that is not controlled by your nausea medication, call the clinic.   BELOW ARE SYMPTOMS THAT SHOULD BE REPORTED IMMEDIATELY:  *FEVER GREATER THAN 100.5 F  *CHILLS WITH OR WITHOUT FEVER  NAUSEA AND VOMITING THAT IS NOT CONTROLLED WITH YOUR NAUSEA MEDICATION  *UNUSUAL SHORTNESS OF BREATH  *UNUSUAL BRUISING OR BLEEDING  TENDERNESS IN MOUTH AND THROAT WITH OR WITHOUT PRESENCE OF ULCERS  *URINARY PROBLEMS  *BOWEL PROBLEMS  UNUSUAL RASH Items with * indicate a potential emergency and should be followed up as soon as possible.  Feel free to call the clinic you have any questions or concerns. The clinic phone number is [phone].  Please show the [place] at check-in to the Emergency Department and triage nurse.  Nivolumab injection What is this medicine? NIVOLUMAB (nye VOL ue mab) is a monoclonal antibody. It is used to treat melanoma, lung cancer, kidney cancer, and Hodgkin lymphoma. This medicine may be used for other purposes; ask your health care provider or pharmacist if you have questions. What should I tell my health care provider before I take this medicine? They need to know if you have any of these conditions: -diabetes -immune system problems -kidney disease -liver disease -lung disease -organ transplant -stomach or intestine problems -thyroid disease -an unusual or allergic reaction to nivolumab, other medicines, foods, dyes, or preservatives -pregnant or trying to get pregnant -breast-feeding How should I use this medicine? This medicine is for infusion into a vein. It is given by a health care professional in a hospital or clinic setting. A  special MedGuide will be given to you before each treatment. Be sure to read this information carefully each time. Talk to your pediatrician regarding the use of this medicine in children. Special care may be needed. Overdosage: If you think you have taken too much of this medicine contact a poison control center or emergency room at once. NOTE: This medicine is only for you. Do not share this medicine with others. What if I miss a dose? It is important not to miss your dose. Call your doctor or health care professional if you are unable to keep an appointment. What may interact with this medicine? Interactions have not been studied. Give your health care provider a list of all the medicines, herbs, non-prescription drugs, or dietary supplements you use. Also tell them if you smoke, drink alcohol, or use illegal drugs. Some items may interact with your medicine. This list may not describe all possible interactions. Give your health care provider a list of all the medicines, herbs, non-prescription drugs, or dietary supplements you use. Also tell them if you smoke, drink alcohol, or use illegal drugs. Some items may interact with your medicine. What should I watch for while using this medicine? This drug may make you feel generally unwell. Continue your course of treatment even though you feel ill unless your doctor tells you to stop. You may need blood work done while you are taking this medicine. Do not become pregnant while taking this medicine or for 5 months after stopping it. Women should inform their doctor if they wish to become pregnant or think they might be pregnant. There is a potential for serious side effects to an  unborn child. Talk to your health care professional or pharmacist for more information. Do not breast-feed an infant while taking this medicine. What side effects may I notice from receiving this medicine? Side effects that you should report to your doctor or health care  professional as soon as possible: -allergic reactions like skin rash, itching or hives, swelling of the face, lips, or tongue -black, tarry stools -blood in the urine -bloody or watery diarrhea -changes in vision -change in sex drive -changes in emotions or moods -chest pain -confusion -cough -decreased appetite -diarrhea -facial flushing -feeling faint or lightheaded -fever, chills -hair loss -hallucination, loss of contact with reality -headache -irritable -joint pain -loss of memory -muscle pain -muscle weakness -seizures -shortness of breath -signs and symptoms of high blood sugar such as dizziness; dry mouth; dry skin; fruity breath; nausea; stomach pain; increased hunger or thirst; increased urination -signs and symptoms of kidney injury like trouble passing urine or change in the amount of urine -signs and symptoms of liver injury like dark yellow or brown urine; general ill feeling or flu-like symptoms; light-colored stools; loss of appetite; nausea; right upper belly pain; unusually weak or tired; yellowing of the eyes or skin -stiff neck -swelling of the ankles, feet, hands -weight gain Side effects that usually do not require medical attention (report to your doctor or health care professional if they continue or are bothersome): -bone pain -constipation -tiredness -vomiting This list may not describe all possible side effects. Call your doctor for medical advice about side effects. You may report side effects to FDA at 1-800-FDA-1088. Where should I keep my medicine? This drug is given in a hospital or clinic and will not be stored at home. NOTE: This sheet is a summary. It may not cover all possible information. If you have questions about this medicine, talk to your doctor, pharmacist, or health care provider.    2016, Elsevier/Gold Standard. (2014-11-17 10:03:42)

## 2016-01-08 DIAGNOSIS — Z719 Counseling, unspecified: Secondary | ICD-10-CM

## 2016-01-11 ENCOUNTER — Ambulatory Visit (HOSPITAL_BASED_OUTPATIENT_CLINIC_OR_DEPARTMENT_OTHER): Payer: Medicare HMO

## 2016-01-11 ENCOUNTER — Other Ambulatory Visit (HOSPITAL_BASED_OUTPATIENT_CLINIC_OR_DEPARTMENT_OTHER): Payer: Medicare HMO

## 2016-01-11 ENCOUNTER — Ambulatory Visit (HOSPITAL_BASED_OUTPATIENT_CLINIC_OR_DEPARTMENT_OTHER): Payer: Medicare HMO | Admitting: Nurse Practitioner

## 2016-01-11 VITALS — BP 115/73 | HR 108 | Temp 97.4°F | Resp 17 | Ht 66.0 in | Wt 150.4 lb

## 2016-01-11 DIAGNOSIS — R05 Cough: Secondary | ICD-10-CM | POA: Diagnosis not present

## 2016-01-11 DIAGNOSIS — C3411 Malignant neoplasm of upper lobe, right bronchus or lung: Secondary | ICD-10-CM | POA: Diagnosis not present

## 2016-01-11 DIAGNOSIS — R5382 Chronic fatigue, unspecified: Secondary | ICD-10-CM

## 2016-01-11 DIAGNOSIS — K5900 Constipation, unspecified: Secondary | ICD-10-CM

## 2016-01-11 DIAGNOSIS — Z79899 Other long term (current) drug therapy: Secondary | ICD-10-CM

## 2016-01-11 DIAGNOSIS — Z5112 Encounter for antineoplastic immunotherapy: Secondary | ICD-10-CM

## 2016-01-11 LAB — CBC WITH DIFFERENTIAL/PLATELET
BASO%: 0.6 % (ref 0.0–2.0)
Basophils Absolute: 0 10*3/uL (ref 0.0–0.1)
EOS%: 0.8 % (ref 0.0–7.0)
Eosinophils Absolute: 0 10*3/uL (ref 0.0–0.5)
HEMATOCRIT: 39.7 % (ref 38.4–49.9)
HEMOGLOBIN: 12.9 g/dL — AB (ref 13.0–17.1)
LYMPH#: 0.8 10*3/uL — AB (ref 0.9–3.3)
LYMPH%: 21.1 % (ref 14.0–49.0)
MCH: 29.6 pg (ref 27.2–33.4)
MCHC: 32.6 g/dL (ref 32.0–36.0)
MCV: 90.8 fL (ref 79.3–98.0)
MONO#: 0.5 10*3/uL (ref 0.1–0.9)
MONO%: 12.5 % (ref 0.0–14.0)
NEUT%: 65 % (ref 39.0–75.0)
NEUTROS ABS: 2.4 10*3/uL (ref 1.5–6.5)
PLATELETS: 306 10*3/uL (ref 140–400)
RBC: 4.37 10*6/uL (ref 4.20–5.82)
RDW: 15.8 % — AB (ref 11.0–14.6)
WBC: 3.7 10*3/uL — AB (ref 4.0–10.3)

## 2016-01-11 LAB — COMPREHENSIVE METABOLIC PANEL
ALBUMIN: 3.3 g/dL — AB (ref 3.5–5.0)
ALK PHOS: 67 U/L (ref 40–150)
ANION GAP: 10 meq/L (ref 3–11)
AST: 14 U/L (ref 5–34)
BILIRUBIN TOTAL: 0.43 mg/dL (ref 0.20–1.20)
BUN: 10 mg/dL (ref 7.0–26.0)
CO2: 23 meq/L (ref 22–29)
CREATININE: 1 mg/dL (ref 0.7–1.3)
Calcium: 10 mg/dL (ref 8.4–10.4)
Chloride: 106 mEq/L (ref 98–109)
EGFR: 87 mL/min/{1.73_m2} — ABNORMAL LOW (ref >90)
Glucose: 119 mg/dl (ref 70–140)
Potassium: 4 mEq/L (ref 3.5–5.1)
Sodium: 139 mEq/L (ref 136–145)
TOTAL PROTEIN: 6.7 g/dL (ref 6.4–8.3)

## 2016-01-11 LAB — TSH: TSH: 0.757 m(IU)/L (ref 0.320–4.118)

## 2016-01-11 MED ORDER — SODIUM CHLORIDE 0.9 % IV SOLN
Freq: Once | INTRAVENOUS | Status: AC
  Administered 2016-01-11: 16:00:00 via INTRAVENOUS

## 2016-01-11 MED ORDER — SODIUM CHLORIDE 0.9 % IV SOLN
240.0000 mg | Freq: Once | INTRAVENOUS | Status: AC
  Administered 2016-01-11: 240 mg via INTRAVENOUS
  Filled 2016-01-11: qty 20

## 2016-01-11 NOTE — Patient Instructions (Signed)
[facility] Discharge Instructions for Patients Receiving Chemotherapy  Today you received the following chemotherapy agents Nivolumab.  To help prevent nausea and vomiting after your treatment, we encourage you to take your nausea medication as prescribed.   If you develop nausea and vomiting that is not controlled by your nausea medication, call the clinic.   BELOW ARE SYMPTOMS THAT SHOULD BE REPORTED IMMEDIATELY:  *FEVER GREATER THAN 100.5 F  *CHILLS WITH OR WITHOUT FEVER  NAUSEA AND VOMITING THAT IS NOT CONTROLLED WITH YOUR NAUSEA MEDICATION  *UNUSUAL SHORTNESS OF BREATH  *UNUSUAL BRUISING OR BLEEDING  TENDERNESS IN MOUTH AND THROAT WITH OR WITHOUT PRESENCE OF ULCERS  *URINARY PROBLEMS  *BOWEL PROBLEMS  UNUSUAL RASH Items with * indicate a potential emergency and should be followed up as soon as possible.  Feel free to call the clinic you have any questions or concerns. The clinic phone number is [phone].  Please show the CHEMO ALERT CARD at check-in to the Emergency Department and triage nurse.   

## 2016-01-11 NOTE — Progress Notes (Signed)
  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  PRIOR THERAPY:  1) Concurrent chemoradiation with weekly carboplatin for AUC of 2 and paclitaxel 45 MG/M2. Status post 5 cycles. Last dose was given 07/18/2015 with partial response. 2) Consolidation chemotherapy with carboplatin for AUC of 5 and Alimta 500 MG/M2 every 3 weeks. First dose 10/06/2015. Status post 3 cycles.  CURRENT THERAPY: Immunotherapy with Nivolumab 240 MG IV every 2 weeks. First dose 12/28/2015.  INTERVAL HISTORY:   Alan [name] returns as scheduled. He completed cycle 1 nivolumab 12/28/2015. He has had some mild nausea and decrease in appetite. No vomiting. No mouth sores. No diarrhea. He has had some constipation. No rash. He has had a slight intermittent cough for the past 2 weeks. The cough does not interfere with activity or sleep. He denies shortness of breath. No fever.  Objective:  Vital signs in last 24 hours:  Blood pressure 115/73, pulse 108, temperature 97.4 F (36.3 C), temperature source Oral, resp. rate 17, height '5\' 6"'$  (1.676 m), weight 150 lb 6.4 oz (68.221 kg), SpO2 100 %.    HEENT: No thrush or ulcers. Resp: Lungs clear bilaterally. No wheezes or rales. Cardio: Regular rate and rhythm. GI: Abdomen soft and nontender. No hepatomegaly. Vascular: No leg edema. Calves soft and nontender. Skin: No rash.    Lab Results:  Lab Results  Component Value Date   WBC 3.7* 01/11/2016   HGB 12.9* 01/11/2016   HCT 39.7 01/11/2016   MCV 90.8 01/11/2016   PLT 306 01/11/2016   NEUTROABS 2.4 01/11/2016    Imaging:  No results found.  Medications: I have reviewed the patient's current medications.  Assessment/Plan: 1. Metastatic non-small cell  lung cancer currently on active treatment with immunotherapy with nivolumab. Cycle 1 completed 12/28/2015.   Disposition: Alan [name] appears stable. He has completed 1 cycle of nivolumab. Plan to proceed with cycle 2 today as scheduled.  At today's visit he reports a slight cough over the past 2 weeks. He has no shortness of breath or fever. Oxygen saturation is 100% on room air. We discussed pneumonitis associated with nivolumab. He understands to contact the office if the cough worsens or he develops any associated symptoms. I discussed the above with Dr. Burr Medico in Dr. Worthy Flank absence.  Alan [name] will return for follow-up visit and cycle 3 nivolumab in 2 weeks. He will contact the office in the interim as outlined above or with any other problems.    Ned Card ANP/GNP-BC   01/11/2016  3:25 PM

## 2016-01-24 ENCOUNTER — Telehealth: Payer: Self-pay

## 2016-01-24 NOTE — Telephone Encounter (Signed)
Pt called stating he was very dehydrated. His urine is dark and he is nauseated, he does not feel well, he has not eaten a good meal since Friday. He is using boost and ensure. He is not drinking enough. Denies fever, denies vomiting. LBM Friday. He has appt for lab/md/nivolumab tomorrow. He is asking what he should do. S/w Dr [name] and pt can see Selena Lesser if he wants or come in tomorrow.  Called pt back and he wants to stay home today. Gave him instructions to drink at least 64 oz of water and juice today, to stay away from coffee and sodas. To stay cool. And to use a stool softener or laxative for his bowels. Pt was agreeable and explained he was being proactive to keep his treatments on schedule.

## 2016-01-25 ENCOUNTER — Ambulatory Visit (HOSPITAL_BASED_OUTPATIENT_CLINIC_OR_DEPARTMENT_OTHER): Payer: Medicare HMO

## 2016-01-25 ENCOUNTER — Telehealth: Payer: Self-pay | Admitting: Internal Medicine

## 2016-01-25 ENCOUNTER — Encounter: Payer: Self-pay | Admitting: Internal Medicine

## 2016-01-25 ENCOUNTER — Other Ambulatory Visit (HOSPITAL_BASED_OUTPATIENT_CLINIC_OR_DEPARTMENT_OTHER): Payer: Medicare HMO

## 2016-01-25 ENCOUNTER — Ambulatory Visit (HOSPITAL_BASED_OUTPATIENT_CLINIC_OR_DEPARTMENT_OTHER): Payer: Medicare HMO | Admitting: Internal Medicine

## 2016-01-25 VITALS — BP 106/64 | HR 106 | Temp 97.9°F | Resp 18 | Ht 66.0 in | Wt 148.4 lb

## 2016-01-25 DIAGNOSIS — C3411 Malignant neoplasm of upper lobe, right bronchus or lung: Secondary | ICD-10-CM | POA: Diagnosis not present

## 2016-01-25 DIAGNOSIS — M545 Low back pain: Secondary | ICD-10-CM | POA: Diagnosis not present

## 2016-01-25 DIAGNOSIS — Z5112 Encounter for antineoplastic immunotherapy: Secondary | ICD-10-CM

## 2016-01-25 DIAGNOSIS — G8929 Other chronic pain: Secondary | ICD-10-CM

## 2016-01-25 LAB — COMPREHENSIVE METABOLIC PANEL
ALBUMIN: 3.2 g/dL — AB (ref 3.5–5.0)
ALK PHOS: 77 U/L (ref 40–150)
AST: 12 U/L (ref 5–34)
Anion Gap: 10 mEq/L (ref 3–11)
BILIRUBIN TOTAL: 0.49 mg/dL (ref 0.20–1.20)
BUN: 17.2 mg/dL (ref 7.0–26.0)
CO2: 23 meq/L (ref 22–29)
CREATININE: 1 mg/dL (ref 0.7–1.3)
Calcium: 9.7 mg/dL (ref 8.4–10.4)
Chloride: 106 mEq/L (ref 98–109)
EGFR: 88 mL/min/{1.73_m2} — AB (ref >90)
GLUCOSE: 110 mg/dL (ref 70–140)
Potassium: 3.8 mEq/L (ref 3.5–5.1)
SODIUM: 139 meq/L (ref 136–145)
TOTAL PROTEIN: 7 g/dL (ref 6.4–8.3)

## 2016-01-25 LAB — CBC WITH DIFFERENTIAL/PLATELET
BASO%: 0.5 % (ref 0.0–2.0)
Basophils Absolute: 0 10*3/uL (ref 0.0–0.1)
EOS ABS: 0 10*3/uL (ref 0.0–0.5)
EOS%: 1.1 % (ref 0.0–7.0)
HCT: 35 % — ABNORMAL LOW (ref 38.4–49.9)
HEMOGLOBIN: 12 g/dL — AB (ref 13.0–17.1)
LYMPH%: 18.4 % (ref 14.0–49.0)
MCH: 30.1 pg (ref 27.2–33.4)
MCHC: 34.3 g/dL (ref 32.0–36.0)
MCV: 87.7 fL (ref 79.3–98.0)
MONO#: 0.5 10*3/uL (ref 0.1–0.9)
MONO%: 12.8 % (ref 0.0–14.0)
NEUT%: 67.2 % (ref 39.0–75.0)
NEUTROS ABS: 2.5 10*3/uL (ref 1.5–6.5)
Platelets: 256 10*3/uL (ref 140–400)
RBC: 3.99 10*6/uL — AB (ref 4.20–5.82)
RDW: 13.9 % (ref 11.0–14.6)
WBC: 3.7 10*3/uL — AB (ref 4.0–10.3)
lymph#: 0.7 10*3/uL — ABNORMAL LOW (ref 0.9–3.3)

## 2016-01-25 MED ORDER — SODIUM CHLORIDE 0.9 % IV SOLN
Freq: Once | INTRAVENOUS | Status: AC
  Administered 2016-01-25: 12:00:00 via INTRAVENOUS

## 2016-01-25 MED ORDER — SODIUM CHLORIDE 0.9 % IV SOLN
240.0000 mg | Freq: Once | INTRAVENOUS | Status: AC
  Administered 2016-01-25: 240 mg via INTRAVENOUS
  Filled 2016-01-25: qty 20

## 2016-01-25 NOTE — Patient Instructions (Signed)
[facility] Discharge Instructions for Patients Receiving Chemotherapy  Today you received the following chemotherapy agents Opdivo.  To help prevent nausea and vomiting after your treatment, we encourage you to take your nausea medication as directed.   If you develop nausea and vomiting that is not controlled by your nausea medication, call the clinic.   BELOW ARE SYMPTOMS THAT SHOULD BE REPORTED IMMEDIATELY:  *FEVER GREATER THAN 100.5 F  *CHILLS WITH OR WITHOUT FEVER  NAUSEA AND VOMITING THAT IS NOT CONTROLLED WITH YOUR NAUSEA MEDICATION  *UNUSUAL SHORTNESS OF BREATH  *UNUSUAL BRUISING OR BLEEDING  TENDERNESS IN MOUTH AND THROAT WITH OR WITHOUT PRESENCE OF ULCERS  *URINARY PROBLEMS  *BOWEL PROBLEMS  UNUSUAL RASH Items with * indicate a potential emergency and should be followed up as soon as possible.  Feel free to call the clinic you have any questions or concerns. The clinic phone number is [phone].  Please show the CHEMO ALERT CARD at check-in to the Emergency Department and triage nurse.    

## 2016-01-25 NOTE — Telephone Encounter (Signed)
per pof to sch pt appt-gave pt copy of avs/cal °

## 2016-01-25 NOTE — Progress Notes (Signed)
[facility] Telephone:([phone]   [phone]  OFFICE PROGRESS NOTE  [name], MD [place] [address]  DIAGNOSIS: Metastatic non-small cell lung cancer initially diagnosed as Stage IIIA (T1b, N2, M0) non-small cell lung cancer, adenocarcinoma presented with right right upper lobe nodule in addition to mediastinal lymphadenopathy diagnosed in November 2016.  PRIOR THERAPY:  1) Concurrent chemoradiation with weekly carboplatin for AUC of 2 and paclitaxel 45 MG/M2. Status post 5 cycles. Last dose was given 07/18/2015 with partial response. 2) Consolidation chemotherapy with carboplatin for AUC of 5 and Alimta 500 MG/M2 every 3 weeks. First dose 10/06/2015. Status post 3 cycles.  CURRENT THERAPY: Immunotherapy with Nivolumab 240 MG IV every 2 weeks. First dose 12/28/2015. Status post 2 cycles.  INTERVAL HISTORY: Alan [name] 75 y.o. male returns to the clinic today for follow-up visit accompanied by his wife. The patient is feeling fine today with no specific complaints except for the persistent low back pain. He was seen by orthopedic surgery and underwent physical therapy with no significant improvement. They are considering steroid injection to the painful back lesion. He is also on tramadol as needed for pain management. He denied having any significant nausea or vomiting. He has no fever or chills. He denied having any significant chest pain, shortness of breath, cough or hemoptysis. He has no significant weight loss or night sweats. He is here today to start cycle #3 of his treatment with immunotherapy with Nivolumab.  MEDICAL HISTORY: Past Medical History:  Diagnosis Date  . Anxiety state, unspecified   . Benign hypertrophy of prostate   . Benign neoplasm of colon    colon polyps  . Cancer [facility])    prostate cancer  . Chronic fatigue 12/23/2015  . Constipation, chronic   . Diverticulosis of colon (without mention of hemorrhage)     . Encounter for antineoplastic chemotherapy 09/29/2015  . Encounter for antineoplastic immunotherapy 12/23/2015  . Erectile dysfunction   . GERD (gastroesophageal reflux disease)   . Glaucoma   . Hypercholesteremia   . Hypothyroidism   . Irritable bowel syndrome   . Left hip pain 10/27/2015  . Onychomycosis   . Radiation 06/20/15-08/02/15   right upper lung region 60 gray  . Restless legs syndrome (RLS)   . Sleep apnea    does not use cpap  . Unspecified essential hypertension     ALLERGIES:  is allergic to codeine.  MEDICATIONS:  Current Outpatient Prescriptions  Medication Sig Dispense Refill  . amLODipine (NORVASC) 5 MG tablet Take 1 tablet (5 mg total) by mouth every morning. 90 tablet 3  . aspirin EC 81 MG tablet Take 81 mg by mouth daily.     . brimonidine-timolol (COMBIGAN) 0.2-0.5 % ophthalmic solution Place 1 drop into both eyes every 12 (twelve) hours.    . chlorproMAZINE (THORAZINE) 25 MG tablet Take 1 tablet (25 mg total) by mouth 3 (three) times daily as needed for hiccoughs. 30 tablet 0  . dexamethasone (DECADRON) 4 MG tablet 4 mg by mouth twice a day the day before, day of and day after the chemotherapy every 3 weeks. 40 tablet 0  . finasteride ([name]) 5 MG tablet TAKE 1 TABLET BY MOUTH EVERY DAY 30 tablet 1  . fluticasone (FLONASE) 50 MCG/ACT nasal spray Place 2 sprays into both nostrils daily as needed for allergies or rhinitis. 48 g 3  . folic acid (FOLVITE) 1 MG tablet Take 1 tablet (1 mg  total) by mouth daily. 30 tablet 2  . lactulose (CHRONULAC) 10 GM/15ML solution Take 10 g by mouth 2 (two) times daily as needed (constipation).     [name] levothyroxine (SYNTHROID, LEVOTHROID) 100 MCG tablet Take 1 tablet (100 mcg total) by mouth daily. 45 tablet 2  . Linaclotide (LINZESS) 290 MCG CAPS capsule Take 1 capsule (290 mcg total) by mouth daily. 30 capsule 3  . meloxicam (MOBIC) 7.5 MG tablet Take 1 tablet (7.5 mg total) by mouth daily. 30 tablet 1  . omeprazole  (PRILOSEC) 20 MG capsule Take 1 capsule (20 mg total) by mouth every morning. 90 capsule 0  . polyethylene glycol (MIRALAX / GLYCOLAX) packet Take 17 g by mouth daily as needed (constipation). Reported on 06/21/2015    . prochlorperazine (COMPAZINE) 10 MG tablet Take 1 tablet (10 mg total) by mouth every 6 (six) hours as needed for nausea or vomiting. 30 tablet 0  . sildenafil (VIAGRA) 100 MG tablet Take 1 tablet (100 mg total) by mouth as directed. 10 tablet 0  . simvastatin (ZOCOR) 20 MG tablet Take 1 tablet (20 mg total) by mouth at bedtime. 90 tablet 3  . sucralfate (CARAFATE) 1 g tablet Take 1 tablet (1 g total) by mouth 4 (four) times daily -  with meals and at bedtime. Mix in 2Tbs water, swish and swallow 60 tablet 0  . tamsulosin (FLOMAX) 0.4 MG CAPS capsule Take 2 capsules (0.8 mg total) by mouth at bedtime. 90 capsule 3  . traMADol (ULTRAM) 50 MG tablet Take 1 tablet (50 mg total) by mouth every 6 (six) hours as needed for moderate pain. 30 tablet 0   No current facility-administered medications for this visit.     SURGICAL HISTORY:  Past Surgical History:  Procedure Laterality Date  . APPENDECTOMY  12/2012  . CARDIAC CATHETERIZATION  ? 2008  . COLONOSCOPY    . LAPAROSCOPIC APPENDECTOMY N/A 01/27/2013   Procedure: APPENDECTOMY LAPAROSCOPIC;  Surgeon: Edward Jolly, MD;  Location: [facility];  Service: General;  Laterality: N/A;  . MEDIASTINOSCOPY N/A 05/31/2015   Procedure: MEDIASTINOSCOPY;  Surgeon: Gaye Pollack, MD;  Location: [facility];  Service: Thoracic;  Laterality: N/A;  . none    . THYROIDECTOMY    . TONSILLECTOMY    . VIDEO BRONCHOSCOPY N/A 05/31/2015   Procedure: VIDEO BRONCHOSCOPY;  Surgeon: Gaye Pollack, MD;  Location: [facility];  Service: Thoracic;  Laterality: N/A;    REVIEW OF SYSTEMS:  Constitutional: positive for fatigue Eyes: negative Ears, nose, mouth, throat, and face: negative Respiratory: negative Cardiovascular: negative Gastrointestinal:  negative Genitourinary:negative Integument/breast: negative Hematologic/lymphatic: negative Musculoskeletal:positive for arthralgias and back pain Neurological: negative Behavioral/Psych: negative Endocrine: negative Allergic/Immunologic: negative   PHYSICAL EXAMINATION: General appearance: alert, cooperative, fatigued and no distress Head: Normocephalic, without obvious abnormality, atraumatic Neck: no adenopathy, no JVD, supple, symmetrical, trachea midline and thyroid not enlarged, symmetric, no tenderness/mass/nodules Lymph nodes: Cervical, supraclavicular, and axillary nodes normal. Resp: clear to auscultation bilaterally Back: symmetric, no curvature. ROM normal. No CVA tenderness. Cardio: regular rate and rhythm, S1, S2 normal, no murmur, click, rub or gallop GI: soft, non-tender; bowel sounds normal; no masses,  no organomegaly Extremities: extremities normal, atraumatic, no cyanosis or edema Neurologic: Alert and oriented X 3, normal strength and tone. Normal symmetric reflexes. Normal coordination and gait  ECOG PERFORMANCE STATUS: 1 - Symptomatic but completely ambulatory  Blood pressure 106/64, pulse (!) 106, temperature 97.9 F (36.6 C), temperature source Oral, resp. rate 18, height '5\' 6"'  (  1.676 m), weight 148 lb 6.4 oz (67.3 kg), SpO2 99 %.  LABORATORY DATA: Lab Results  Component Value Date   WBC 3.7 (L) 01/25/2016   HGB 12.0 (L) 01/25/2016   HCT 35.0 (L) 01/25/2016   MCV 87.7 01/25/2016   PLT 256 01/25/2016      Chemistry      Component Value Date/Time   NA 139 01/11/2016 1449   K 4.0 01/11/2016 1449   CL 110 05/31/2015 0645   CO2 23 01/11/2016 1449   BUN 10.0 01/11/2016 1449   CREATININE 1.0 01/11/2016 1449      Component Value Date/Time   CALCIUM 10.0 01/11/2016 1449   ALKPHOS 67 01/11/2016 1449   AST 14 01/11/2016 1449   ALT <9 01/11/2016 1449   BILITOT 0.43 01/11/2016 1449       RADIOGRAPHIC STUDIES: No results found.  ASSESSMENT AND  PLAN: This is a very pleasant 75 years old African-American male recently diagnosed with a stage IIIA non-small cell lung cancer, adenocarcinoma with negative EGFR mutation, negative ALK gene translocation and negative ROS1. He completed a course of concurrent chemoradiation with weekly carboplatin and paclitaxel status post 5 cycles. The patient tolerated his treatment fairly well. The recent CT scan of the chest showed partial response in the primary left upper lobe neoplasm with no residual thoracic adenopathy. He was seen by Dr. Cyndia Bent for consideration of surgical resection but the patient became apprehensive about surgery after discussion of the risk and benefits of the procedure. He is currently undergoing consolidation chemotherapy with carboplatin and Alimta status post 3 cycles. He tolerated his treatment fairly well. The recent CT scan of the chest showed improvement of the primary right upper lobe tumor but there was seen you solid right upper lobe pulmonary nodules suspicious for metastasis.  The recent PET scan showed reduction in the size and metabolic activity of the dominant right upper lobe pulmonary nodule as well as resolution of the metabolic activity and reduction in the size of the right paratracheal lymph node but unfortunately there was new hypermetabolic nodule within the right upper lobe concerning for a metastatic nodule in addition to a solitary hypermetabolic skeletal metastasis at the T9 vertebral body. The patient is currently on treatment with immunotherapy with Nivolumab status post 2 cycles. He is tolerating his treatment well with no significant complaints. I recommended for him to proceed with cycle #3 today as scheduled. He would come back for follow-up visit in 2 weeks with the start of cycle #4. For the low back pain, the patient will continue his evaluation by orthopedic surgery. The patient was advised to call immediately if he has any concerning symptoms in the  interval. The patient voices understanding of current disease status and treatment options and is in agreement with the current care plan.  All questions were answered. The patient knows to call the clinic with any problems, questions or concerns. We can certainly see the patient much sooner if necessary.  Disclaimer: This note was dictated with voice recognition software. Similar sounding words can inadvertently be transcribed and may not be corrected upon review.

## 2016-01-26 NOTE — Congregational Nurse Program (Signed)
Congregational Nurse Program Note  Date of Encounter: 08/21/2015  Past Medical History: Past Medical History:  Diagnosis Date  . Anxiety state, unspecified   . Benign hypertrophy of prostate   . Benign neoplasm of colon    colon polyps  . Cancer [facility])    prostate cancer  . Chronic fatigue 12/23/2015  . Constipation, chronic   . Diverticulosis of colon (without mention of hemorrhage)   . Encounter for antineoplastic chemotherapy 09/29/2015  . Encounter for antineoplastic immunotherapy 12/23/2015  . Erectile dysfunction   . GERD (gastroesophageal reflux disease)   . Glaucoma   . Hypercholesteremia   . Hypothyroidism   . Irritable bowel syndrome   . Left hip pain 10/27/2015  . Onychomycosis   . Radiation 06/20/15-08/02/15   right upper lung region 60 gray  . Restless legs syndrome (RLS)   . Sleep apnea    does not use cpap  . Unspecified essential hypertension     Encounter Details:    He is feeling better since his radiation has been discontinued on August 02, 2015.  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    Continues to be hopeful and upbeat.  Will have more testing March 9 when he goes back for followup.  Will continue to follow client.

## 2016-01-29 DIAGNOSIS — Z719 Counseling, unspecified: Secondary | ICD-10-CM

## 2016-01-30 ENCOUNTER — Telehealth: Payer: Self-pay | Admitting: *Deleted

## 2016-01-30 NOTE — Telephone Encounter (Signed)
Noted from last refill of Tramadol on 6/23 that  All future refills will need to come from pt's  PCP.  Called pt at home and left message on voice mail instructing pt to contact his PCP.  Spoke with [employer] and informed the pharmacist to also contact pt's PCP for refill request.

## 2016-01-30 NOTE — Telephone Encounter (Signed)
Wife Enid Derry called requesting refill of Tramadol for pt.  Dr. [name] notified. Pt's   Phone    [phone].

## 2016-02-01 ENCOUNTER — Telehealth: Payer: Self-pay | Admitting: Medical Oncology

## 2016-02-01 NOTE — Telephone Encounter (Signed)
Faxed response to pharmacy to contact PCP

## 2016-02-02 ENCOUNTER — Telehealth: Payer: Self-pay | Admitting: Medical Oncology

## 2016-02-02 NOTE — Telephone Encounter (Signed)
eived TC from [facility] regarding epidural injection for pt's low back pain.  Reviewed chart notes and made call back to [place], [place]. Left VM message for Asencion Partridge, informing her that Dr. [name] wants to hold off on epidural injection until pt completes his immunotherapy.

## 2016-02-02 NOTE — Telephone Encounter (Signed)
Daughter calling on pts behalf -orthopedics recommend epidural procedure for pain management. I told her [name] wants him to wait until after he finishes his immunotherapy.

## 2016-02-03 ENCOUNTER — Telehealth: Payer: Self-pay | Admitting: Medical Oncology

## 2016-02-03 NOTE — Telephone Encounter (Signed)
I called pt and he said he was going to be referred to pain management clinic by ortho.

## 2016-02-03 NOTE — Congregational Nurse Program (Signed)
Congregational Nurse Program Note  Date of Encounter: 01/08/2016  Past Medical History: Past Medical History:  Diagnosis Date  . Anxiety state, unspecified   . Benign hypertrophy of prostate   . Benign neoplasm of colon    colon polyps  . Cancer [facility])    prostate cancer  . Chronic fatigue 12/23/2015  . Constipation, chronic   . Diverticulosis of colon (without mention of hemorrhage)   . Encounter for antineoplastic chemotherapy 09/29/2015  . Encounter for antineoplastic immunotherapy 12/23/2015  . Erectile dysfunction   . GERD (gastroesophageal reflux disease)   . Glaucoma   . Hypercholesteremia   . Hypothyroidism   . Irritable bowel syndrome   . Left hip pain 10/27/2015  . Onychomycosis   . Radiation 06/20/15-08/02/15   right upper lung region 60 gray  . Restless legs syndrome (RLS)   . Sleep apnea    does not use cpap  . Unspecified essential hypertension     Encounter Details:     CNP Questionnaire - 01/08/16 0203      Patient Demographics   Is this a new or existing patient? Existing   Patient is considered a/an Not Applicable   Race African-American/Black     Patient Assistance   Location of Patient Assistance [employer]   Patient's financial/insurance status Low Income;Medicare   Uninsured Patient No   Patient referred to apply for the following financial assistance Not Applicable   Food insecurities addressed Not Applicable   Transportation assistance No   Assistance securing medications No   Educational health offerings Cancer;Safety;Health literacy;Nutrition;Spiritual care     Encounter Details   Primary purpose of visit Education/Health Concerns;Safety;Chronic Illness/Condition Visit;Spiritual Care/Support Visit;Post PCP Visit   Was an Emergency Department visit averted? No   Does patient have a medical provider? Yes   Patient referred to Not Applicable   Was a mental health screening completed? (GAINS tool) No   Does patient have dental issues?  No   Does patient have vision issues? Yes   Was a vision referral made? No   Does your patient have an abnormal blood pressure today? No   Since previous encounter, have you referred patient for abnormal blood pressure that resulted in a new diagnosis or medication change? No   Does your patient have an abnormal blood glucose today? No   Since previous encounter, have you referred patient for abnormal blood glucose that resulted in a new diagnosis or medication change? No   Was there a life-saving intervention made? No      Client was in church today and was active and in fair spirits.  He has a poor appetite and trouble swallowing.  He is using a cane and has had some falls but not hurt self.  Suggested ensurebut reports difficulty drinking it.  Client encouraged to use safety measures and use cane or walker.  Will f/i

## 2016-02-04 ENCOUNTER — Other Ambulatory Visit: Payer: Self-pay | Admitting: Internal Medicine

## 2016-02-04 DIAGNOSIS — C3411 Malignant neoplasm of upper lobe, right bronchus or lung: Secondary | ICD-10-CM

## 2016-02-06 ENCOUNTER — Telehealth: Payer: Self-pay | Admitting: Medical Oncology

## 2016-02-06 DIAGNOSIS — M25559 Pain in unspecified hip: Secondary | ICD-10-CM

## 2016-02-06 NOTE — Telephone Encounter (Signed)
-----  Message from Curt Bears, MD sent at 02/04/2016 10:24 AM EDT ----- Regarding: RE: pain I referred him to rad onc for XRT to this area. ----- Message ----- From: Ardeen Garland, RN Sent: 02/03/2016   3:47 PM To: Curt Bears, MD Subject: pain                                            asking for something stronger for back pain since he cannot have epidural. On 8/2 Dr Brett Albino prescribed tramadol 50 bid #40  Anything else I need to do?  Could  T9 met be contributing to increased pain?

## 2016-02-06 NOTE — Telephone Encounter (Signed)
Pt.notified

## 2016-02-06 NOTE — Telephone Encounter (Signed)
Daughter called , stated her father asked her to call us. He has an " Increased dry cough and he cannot get his breath like he is choking". I told her to call if he wants appt sooner than wed.

## 2016-02-07 NOTE — Congregational Nurse Program (Signed)
Congregational Nurse Program Note  Date of Encounter: 02/06/2016  Past Medical History: Past Medical History:  Diagnosis Date  . Anxiety state, unspecified   . Benign hypertrophy of prostate   . Benign neoplasm of colon    colon polyps  . Cancer [facility])    prostate cancer  . Chronic fatigue 12/23/2015  . Constipation, chronic   . Diverticulosis of colon (without mention of hemorrhage)   . Encounter for antineoplastic chemotherapy 09/29/2015  . Encounter for antineoplastic immunotherapy 12/23/2015  . Erectile dysfunction   . GERD (gastroesophageal reflux disease)   . Glaucoma   . Hypercholesteremia   . Hypothyroidism   . Irritable bowel syndrome   . Left hip pain 10/27/2015  . Onychomycosis   . Radiation 06/20/15-08/02/15   right upper lung region 60 gray  . Restless legs syndrome (RLS)   . Sleep apnea    does not use cpap  . Unspecified essential hypertension     Encounter Details:     CNP Questionnaire - 02/06/16 0215      Patient Demographics   Is this a new or existing patient? Existing   Patient is considered a/an Not Applicable   Race African-American/Black     Patient Assistance   Location of Patient Assistance [employer]   Patient's financial/insurance status Low Income;Medicare   Uninsured Patient No   Patient referred to apply for the following financial assistance Not Applicable   Food insecurities addressed Not Applicable   Transportation assistance No   Assistance securing medications No   Educational health offerings Cancer;Safety;Health literacy;Nutrition;Spiritual care;Chronic disease     Encounter Details   Primary purpose of visit Education/Health Concerns;Safety;Chronic Illness/Condition Visit;Spiritual Care/Support Visit;Post PCP Visit;Navigating the Healthcare System   Was an Emergency Department visit averted? No   Does patient have a medical provider? Yes   Patient referred to Follow up with established PCP   Was a mental health screening  completed? (GAINS tool) No   Does patient have dental issues? No   Does patient have vision issues? Yes   Was a vision referral made? No   Does your patient have an abnormal blood pressure today? No   Since previous encounter, have you referred patient for abnormal blood pressure that resulted in a new diagnosis or medication change? No   Does your patient have an abnormal blood glucose today? No   Since previous encounter, have you referred patient for abnormal blood glucose that resulted in a new diagnosis or medication change? No   Was there a life-saving intervention made? No      Client seen at [facility].  Client has been having episodes of cough without any mucous.  He is currently on pain medication but still having pain.  Cant sleep well and feels exhausted most of the time  He reports he has an appt. On Wednesday, but I encouraged him to call in a.m. To address these issue of pain.  He doesn't want to bother health care providers, but I encouraged him to call.  He did say he was to be referred to pain management program.

## 2016-02-07 NOTE — Progress Notes (Signed)
Histology and Location of Primary Cancer: Metastatic non-small cell lung cancer   Location(s) of Symptomatic Metastases: Solitary hypermetabolic SKELETAL METASTASIS at the T9 vertebral body as seen on PET scan from 12/19/15.  Past/Anticipated chemotherapy by medical oncology, if any: Immunotherapy with Nivolumab 240 MG IV every 2 weeks. First dose 12/28/2015. Status post 2 cycles.   Pain on a scale of 0-10 is: 8 in his hips, legs and groin area - right worse than left.  He is alternating 2 tablets of 500 mg tylenol with tramadol.  He reports the pain never goes away but the pain medication helps him tolerate it.  If Spine Met(s), symptoms, if any, include:  Bowel/Bladder retention or incontinence (please describe): denies bladder issues, reports having constipation  Numbness or weakness in extremities (please describe): has weakness in his legs.  Reports having muscle spasms in his right leg that happen every couple of hours.  Current Decadron regimen, if applicable: none  Ambulatory status? Walker? Wheelchair?: uses a walker  SAFETY ISSUES:  Prior radiation? 06/20/15-08/02/15 - right upper lung region 60 gray  Pacemaker/ICD? no  Possible current pregnancy? no  Is the patient on methotrexate? no  Current Complaints / other details:  Patient is here with his wife.  He is concerned because he has a history of prostate cancer and his recent PSA was elevated at 4.46 on 01/19/16.  He is wondering if this could be causing his hip pain.  He also reports having a dry cough and wheezing that worsens when he turns his neck to either side.  BP 114/64 (BP Location: Left Arm, Patient Position: Sitting)   Pulse (!) 104   Temp 97.5 F (36.4 C) (Oral)   Ht '5\' 6"'  (1.676 m)   Wt 152 lb (68.9 kg)   SpO2 99%   BMI 24.53 kg/m    Wt Readings from Last 3 Encounters:  02/08/16 152 lb (68.9 kg)  01/25/16 148 lb 6.4 oz (67.3 kg)  01/11/16 150 lb 6.4 oz (68.2 kg)

## 2016-02-07 NOTE — Congregational Nurse Program (Signed)
Congregational Nurse Program Note  Date of Encounter: 01/29/2016  Past Medical History: Past Medical History:  Diagnosis Date  . Anxiety state, unspecified   . Benign hypertrophy of prostate   . Benign neoplasm of colon    colon polyps  . Cancer [facility])    prostate cancer  . Chronic fatigue 12/23/2015  . Constipation, chronic   . Diverticulosis of colon (without mention of hemorrhage)   . Encounter for antineoplastic chemotherapy 09/29/2015  . Encounter for antineoplastic immunotherapy 12/23/2015  . Erectile dysfunction   . GERD (gastroesophageal reflux disease)   . Glaucoma   . Hypercholesteremia   . Hypothyroidism   . Irritable bowel syndrome   . Left hip pain 10/27/2015  . Onychomycosis   . Radiation 06/20/15-08/02/15   right upper lung region 60 gray  . Restless legs syndrome (RLS)   . Sleep apnea    does not use cpap  . Unspecified essential hypertension     Encounter Details:     CNP Questionnaire - 01/29/16 0123      Patient Demographics   Is this a new or existing patient? Existing   Patient is considered a/an Not Applicable   Race African-American/Black     Patient Assistance   Location of Patient Assistance [employer]   Patient's financial/insurance status Low Income;Medicare   Uninsured Patient No   Patient referred to apply for the following financial assistance Not Applicable   Food insecurities addressed Not Applicable   Transportation assistance No   Assistance securing medications No   Educational health offerings Cancer;Safety;Health literacy;Nutrition;Spiritual care;Chronic disease     Encounter Details   Primary purpose of visit Education/Health Concerns;Safety;Chronic Illness/Condition Visit;Spiritual Care/Support Visit;Post PCP Visit;Navigating the Healthcare System   Was an Emergency Department visit averted? No   Does patient have a medical provider? Yes   Patient referred to Follow up with established PCP   Was a mental health screening  completed? (GAINS tool) No   Does patient have dental issues? No   Does patient have vision issues? Yes   Was a vision referral made? No   Does your patient have an abnormal blood pressure today? No   Since previous encounter, have you referred patient for abnormal blood pressure that resulted in a new diagnosis or medication change? No   Does your patient have an abnormal blood glucose today? No   Since previous encounter, have you referred patient for abnormal blood glucose that resulted in a new diagnosis or medication change? No   Was there a life-saving intervention made? No      Client reports more pain and weakness in legs.  Came in late to service.  Using wheelchair.,  Disturbed by lack of ability to be independent.  Taking pain med regularly.Offered care and concern.

## 2016-02-08 ENCOUNTER — Telehealth: Payer: Self-pay | Admitting: Internal Medicine

## 2016-02-08 ENCOUNTER — Other Ambulatory Visit (HOSPITAL_BASED_OUTPATIENT_CLINIC_OR_DEPARTMENT_OTHER): Payer: Medicare HMO

## 2016-02-08 ENCOUNTER — Ambulatory Visit (HOSPITAL_BASED_OUTPATIENT_CLINIC_OR_DEPARTMENT_OTHER): Payer: Medicare HMO

## 2016-02-08 ENCOUNTER — Encounter: Payer: Self-pay | Admitting: Radiation Oncology

## 2016-02-08 ENCOUNTER — Ambulatory Visit
Admission: RE | Admit: 2016-02-08 | Discharge: 2016-02-08 | Disposition: A | Discharge status: Home / Self Care | Payer: Medicare HMO | Source: Ambulatory Visit | Attending: Radiation Oncology | Admitting: Radiation Oncology

## 2016-02-08 ENCOUNTER — Ambulatory Visit (HOSPITAL_BASED_OUTPATIENT_CLINIC_OR_DEPARTMENT_OTHER): Payer: Medicare HMO | Admitting: Internal Medicine

## 2016-02-08 ENCOUNTER — Encounter: Payer: Self-pay | Admitting: Internal Medicine

## 2016-02-08 VITALS — BP 116/73 | HR 103 | Temp 98.5°F | Resp 17 | Ht 66.0 in | Wt 149.1 lb

## 2016-02-08 VITALS — HR 96

## 2016-02-08 VITALS — BP 114/64 | HR 104 | Temp 97.5°F | Ht 66.0 in | Wt 152.0 lb

## 2016-02-08 DIAGNOSIS — M545 Low back pain: Secondary | ICD-10-CM | POA: Diagnosis not present

## 2016-02-08 DIAGNOSIS — Z510 Encounter for antineoplastic radiation therapy: Secondary | ICD-10-CM | POA: Diagnosis not present

## 2016-02-08 DIAGNOSIS — G893 Neoplasm related pain (acute) (chronic): Secondary | ICD-10-CM

## 2016-02-08 DIAGNOSIS — Z885 Allergy status to narcotic agent status: Secondary | ICD-10-CM | POA: Diagnosis not present

## 2016-02-08 DIAGNOSIS — M25552 Pain in left hip: Secondary | ICD-10-CM

## 2016-02-08 DIAGNOSIS — R05 Cough: Secondary | ICD-10-CM | POA: Insufficient documentation

## 2016-02-08 DIAGNOSIS — C3411 Malignant neoplasm of upper lobe, right bronchus or lung: Secondary | ICD-10-CM

## 2016-02-08 DIAGNOSIS — Z7982 Long term (current) use of aspirin: Secondary | ICD-10-CM | POA: Insufficient documentation

## 2016-02-08 DIAGNOSIS — C7951 Secondary malignant neoplasm of bone: Secondary | ICD-10-CM | POA: Insufficient documentation

## 2016-02-08 DIAGNOSIS — Z79899 Other long term (current) drug therapy: Secondary | ICD-10-CM | POA: Diagnosis not present

## 2016-02-08 DIAGNOSIS — Z5112 Encounter for antineoplastic immunotherapy: Secondary | ICD-10-CM | POA: Diagnosis not present

## 2016-02-08 LAB — COMPREHENSIVE METABOLIC PANEL
ALBUMIN: 3 g/dL — AB (ref 3.5–5.0)
ALT: 12 U/L (ref 0–55)
ANION GAP: 9 meq/L (ref 3–11)
AST: 13 U/L (ref 5–34)
Alkaline Phosphatase: 84 U/L (ref 40–150)
BILIRUBIN TOTAL: 0.38 mg/dL (ref 0.20–1.20)
BUN: 11 mg/dL (ref 7.0–26.0)
CO2: 23 mEq/L (ref 22–29)
CREATININE: 0.9 mg/dL (ref 0.7–1.3)
Calcium: 10.1 mg/dL (ref 8.4–10.4)
Chloride: 107 mEq/L (ref 98–109)
EGFR: >90 mL/min/{1.73_m2} (ref >90)
Glucose: 96 mg/dl (ref 70–140)
Potassium: 4 mEq/L (ref 3.5–5.1)
Sodium: 139 mEq/L (ref 136–145)
TOTAL PROTEIN: 7.4 g/dL (ref 6.4–8.3)

## 2016-02-08 LAB — CBC WITH DIFFERENTIAL/PLATELET
BASO%: 0.3 % (ref 0.0–2.0)
Basophils Absolute: 0 10*3/uL (ref 0.0–0.1)
EOS%: 0.5 % (ref 0.0–7.0)
Eosinophils Absolute: 0 10*3/uL (ref 0.0–0.5)
HEMATOCRIT: 38 % — AB (ref 38.4–49.9)
HEMOGLOBIN: 12.6 g/dL — AB (ref 13.0–17.1)
LYMPH#: 0.6 10*3/uL — AB (ref 0.9–3.3)
LYMPH%: 10.9 % — ABNORMAL LOW (ref 14.0–49.0)
MCH: 29.7 pg (ref 27.2–33.4)
MCHC: 33.1 g/dL (ref 32.0–36.0)
MCV: 89.9 fL (ref 79.3–98.0)
MONO#: 0.5 10*3/uL (ref 0.1–0.9)
MONO%: 9.5 % (ref 0.0–14.0)
NEUT%: 78.8 % — AB (ref 39.0–75.0)
NEUTROS ABS: 4.1 10*3/uL (ref 1.5–6.5)
PLATELETS: 441 10*3/uL — AB (ref 140–400)
RBC: 4.23 10*6/uL (ref 4.20–5.82)
RDW: 14.3 % (ref 11.0–14.6)
WBC: 5.2 10*3/uL (ref 4.0–10.3)

## 2016-02-08 MED ORDER — HYDROCOD POLST-CPM POLST ER 10-8 MG/5ML PO SUER: 5.0000 mL | Freq: Two times a day (BID) | ORAL | 0 refills | Status: DC | PRN

## 2016-02-08 MED ORDER — GUAIFENESIN ER 600 MG PO TB12
1200.0000 mg | ORAL_TABLET | Freq: Two times a day (BID) | ORAL | 1 refills | Status: DC
Start: 2016-02-08 — End: 2016-04-02

## 2016-02-08 MED ORDER — OXYCODONE-ACETAMINOPHEN 5-325 MG PO TABS: 1.0000 | ORAL_TABLET | ORAL | 0 refills | Status: DC | PRN

## 2016-02-08 MED ORDER — SODIUM CHLORIDE 0.9 % IV SOLN
Freq: Once | INTRAVENOUS | Status: AC
  Administered 2016-02-08: 13:00:00 via INTRAVENOUS

## 2016-02-08 MED ORDER — SODIUM CHLORIDE 0.9 % IV SOLN
240.0000 mg | Freq: Once | INTRAVENOUS | Status: AC
  Administered 2016-02-08: 240 mg via INTRAVENOUS
  Filled 2016-02-08: qty 24

## 2016-02-08 NOTE — Telephone Encounter (Signed)
per pof no appts added-gave pt updated copy

## 2016-02-08 NOTE — Progress Notes (Signed)
[facility] [phone]   [phone]  OFFICE PROGRESS NOTE  [name], MD [place] [address]  DIAGNOSIS: Metastatic non-small cell lung cancer initially diagnosed as Stage IIIA (T1b, N2, M0) non-small cell lung cancer, adenocarcinoma presented with right right upper lobe nodule in addition to mediastinal lymphadenopathy diagnosed in November 2016.  PRIOR THERAPY:  1) Concurrent chemoradiation with weekly carboplatin for AUC of 2 and paclitaxel 45 MG/M2. Status post 5 cycles. Last dose was given 07/18/2015 with partial response. 2) Consolidation chemotherapy with carboplatin for AUC of 5 and Alimta 500 MG/M2 every 3 weeks. First dose 10/06/2015. Status post 3 cycles.  CURRENT THERAPY: Immunotherapy with Nivolumab 240 MG IV every 2 weeks. First dose 12/28/2015. Status post 3 cycles.  INTERVAL HISTORY: Alan [name] 75 y.o. male returns to the clinic today for follow-up visit accompanied by his wife. The patient is feeling fine today with no specific complaints except for the persistent low back and hip pain. He was referred to Dr. Sondra Come and expected to start palliative radiotherapy to questionable lesion in the hip and T9. He was also started on Percocet by Dr. Sondra Come for pain management. He tolerated the last cycle of his treatment with immunotherapy fairly well was no significant adverse effects except for mild cough. He denied having any significant nausea or vomiting. He has no fever or chills. He denied having any significant chest pain, shortness of breath or hemoptysis. He has no significant weight loss or night sweats. He is here today to start cycle #4 of his treatment with immunotherapy with Nivolumab.  MEDICAL HISTORY: Past Medical History:  Diagnosis Date  . Anxiety state, unspecified   . Benign hypertrophy of prostate   . Benign neoplasm of colon    colon polyps  . Cancer [facility])    prostate cancer  . Chronic  fatigue 12/23/2015  . Constipation, chronic   . Diverticulosis of colon (without mention of hemorrhage)   . Encounter for antineoplastic chemotherapy 09/29/2015  . Encounter for antineoplastic immunotherapy 12/23/2015  . Erectile dysfunction   . GERD (gastroesophageal reflux disease)   . Glaucoma   . Hypercholesteremia   . Hypothyroidism   . Irritable bowel syndrome   . Left hip pain 10/27/2015  . Onychomycosis   . Radiation 06/20/15-08/02/15   right upper lung region 60 gray  . Restless legs syndrome (RLS)   . Sleep apnea    does not use cpap  . Unspecified essential hypertension     ALLERGIES:  is allergic to codeine.  MEDICATIONS:  Current Outpatient Prescriptions  Medication Sig Dispense Refill  . acetaminophen (TYLENOL) 500 MG tablet Take 1,000 mg by mouth every 4 (four) hours as needed.    [name] amLODipine (NORVASC) 5 MG tablet Take 1 tablet (5 mg total) by mouth every morning. 90 tablet 3  . aspirin EC 81 MG tablet Take 81 mg by mouth daily.     . brimonidine-timolol (COMBIGAN) 0.2-0.5 % ophthalmic solution Place 1 drop into both eyes every 12 (twelve) hours.    . chlorpheniramine-HYDROcodone (TUSSIONEX) 10-8 MG/5ML SUER Take 5 mLs by mouth every 12 (twelve) hours as needed for cough. 140 mL 0  . chlorproMAZINE (THORAZINE) 25 MG tablet Take 1 tablet (25 mg total) by mouth 3 (three) times daily as needed for hiccoughs. (Patient not taking: Reported on 02/08/2016) 30 tablet 0  . dexamethasone (DECADRON) 4 MG tablet 4 mg by mouth twice a day the day  before, day of and day after the chemotherapy every 3 weeks. (Patient not taking: Reported on 02/08/2016) 40 tablet 0  . finasteride ([name]) 5 MG tablet TAKE 1 TABLET BY MOUTH EVERY DAY 30 tablet 1  . fluticasone (FLONASE) 50 MCG/ACT nasal spray Place 2 sprays into both nostrils daily as needed for allergies or rhinitis. 48 g 3  . folic acid (FOLVITE) 1 MG tablet Take 1 tablet (1 mg total) by mouth daily. 30 tablet 2  . guaiFENesin (MUCINEX)  600 MG 12 hr tablet Take 2 tablets (1,200 mg total) by mouth 2 (two) times daily. 30 tablet 1  . lactulose (CHRONULAC) 10 GM/15ML solution Take 10 g by mouth 2 (two) times daily as needed (constipation).     [name] levothyroxine (SYNTHROID, LEVOTHROID) 100 MCG tablet Take 1 tablet (100 mcg total) by mouth daily. 45 tablet 2  . Linaclotide (LINZESS) 290 MCG CAPS capsule Take 1 capsule (290 mcg total) by mouth daily. 30 capsule 3  . meloxicam (MOBIC) 7.5 MG tablet Take 1 tablet (7.5 mg total) by mouth daily. (Patient not taking: Reported on 02/08/2016) 30 tablet 1  . omeprazole (PRILOSEC) 20 MG capsule Take 1 capsule (20 mg total) by mouth every morning. 90 capsule 0  . oxyCODONE-acetaminophen (PERCOCET/ROXICET) 5-325 MG tablet Take 1 tablet by mouth every 4 (four) hours as needed for severe pain. 30 tablet 0  . polyethylene glycol (MIRALAX / GLYCOLAX) packet Take 17 g by mouth daily as needed (constipation). Reported on 06/21/2015    . prochlorperazine (COMPAZINE) 10 MG tablet Take 1 tablet (10 mg total) by mouth every 6 (six) hours as needed for nausea or vomiting. 30 tablet 0  . sildenafil (VIAGRA) 100 MG tablet Take 1 tablet (100 mg total) by mouth as directed. 10 tablet 0  . simvastatin (ZOCOR) 20 MG tablet Take 1 tablet (20 mg total) by mouth at bedtime. 90 tablet 3  . sucralfate (CARAFATE) 1 g tablet Take 1 tablet (1 g total) by mouth 4 (four) times daily -  with meals and at bedtime. Mix in 2Tbs water, swish and swallow (Patient not taking: Reported on 02/08/2016) 60 tablet 0  . tamsulosin (FLOMAX) 0.4 MG CAPS capsule Take 2 capsules (0.8 mg total) by mouth at bedtime. 90 capsule 3  . traMADol (ULTRAM) 50 MG tablet Take 1 tablet (50 mg total) by mouth every 6 (six) hours as needed for moderate pain. 30 tablet 0   No current facility-administered medications for this visit.     SURGICAL HISTORY:  Past Surgical History:  Procedure Laterality Date  . APPENDECTOMY  12/2012  . CARDIAC CATHETERIZATION  ?  2008  . COLONOSCOPY    . LAPAROSCOPIC APPENDECTOMY N/A 01/27/2013   Procedure: APPENDECTOMY LAPAROSCOPIC;  Surgeon: Mariella Saa, MD;  Location: [facility];  Service: General;  Laterality: N/A;  . MEDIASTINOSCOPY N/A 05/31/2015   Procedure: MEDIASTINOSCOPY;  Surgeon: Alleen Borne, MD;  Location: [facility] OR;  Service: Thoracic;  Laterality: N/A;  . none    . THYROIDECTOMY    . TONSILLECTOMY    . VIDEO BRONCHOSCOPY N/A 05/31/2015   Procedure: VIDEO BRONCHOSCOPY;  Surgeon: Alleen Borne, MD;  Location: [facility] OR;  Service: Thoracic;  Laterality: N/A;    REVIEW OF SYSTEMS:  A comprehensive review of systems was negative except for: Constitutional: positive for fatigue Respiratory: positive for cough Musculoskeletal: positive for back pain   PHYSICAL EXAMINATION: General appearance: alert, cooperative, fatigued and no distress Head: Normocephalic, without obvious abnormality, atraumatic Neck:  no adenopathy, no JVD, supple, symmetrical, trachea midline and thyroid not enlarged, symmetric, no tenderness/mass/nodules Lymph nodes: Cervical, supraclavicular, and axillary nodes normal. Resp: clear to auscultation bilaterally Back: symmetric, no curvature. ROM normal. No CVA tenderness. Cardio: regular rate and rhythm, S1, S2 normal, no murmur, click, rub or gallop GI: soft, non-tender; bowel sounds normal; no masses,  no organomegaly Extremities: extremities normal, atraumatic, no cyanosis or edema Neurologic: Alert and oriented X 3, normal strength and tone. Normal symmetric reflexes. Normal coordination and gait  ECOG PERFORMANCE STATUS: 1 - Symptomatic but completely ambulatory  There were no vitals taken for this visit.  LABORATORY DATA: Lab Results  Component Value Date   WBC 5.2 02/08/2016   HGB 12.6 (L) 02/08/2016   HCT 38.0 (L) 02/08/2016   MCV 89.9 02/08/2016   PLT 441 (H) 02/08/2016      Chemistry      Component Value Date/Time   NA 139 01/25/2016 1052   K 3.8 01/25/2016 1052    CL 110 05/31/2015 0645   CO2 23 01/25/2016 1052   BUN 17.2 01/25/2016 1052   CREATININE 1.0 01/25/2016 1052      Component Value Date/Time   CALCIUM 9.7 01/25/2016 1052   ALKPHOS 77 01/25/2016 1052   AST 12 01/25/2016 1052   ALT <9 01/25/2016 1052   BILITOT 0.49 01/25/2016 1052       RADIOGRAPHIC STUDIES: No results found.  ASSESSMENT AND PLAN: This is a very pleasant 75 years old African-American male recently diagnosed with a stage IIIA non-small cell lung cancer, adenocarcinoma with negative EGFR mutation, negative ALK gene translocation and negative ROS1. He completed a course of concurrent chemoradiation with weekly carboplatin and paclitaxel status post 5 cycles. The patient tolerated his treatment fairly well. The recent CT scan of the chest showed partial response in the primary left upper lobe neoplasm with no residual thoracic adenopathy. He was seen by Dr. Cyndia Bent for consideration of surgical resection but the patient became apprehensive about surgery after discussion of the risk and benefits of the procedure. He completed consolidation chemotherapy with carboplatin and Alimta status post 3 cycles. He tolerated his treatment fairly well. The recent CT scan of the chest showed improvement of the primary right upper lobe tumor but there was seen you solid right upper lobe pulmonary nodules suspicious for metastasis.  The recent PET scan showed reduction in the size and metabolic activity of the dominant right upper lobe pulmonary nodule as well as resolution of the metabolic activity and reduction in the size of the right paratracheal lymph node but unfortunately there was new hypermetabolic nodule within the right upper lobe concerning for a metastatic nodule in addition to a solitary hypermetabolic skeletal metastasis at the T9 vertebral body. The patient is currently on treatment with immunotherapy with Nivolumab status post 3 cycles. He is tolerating his treatment well with  no significant complaints. I recommended for him to proceed with cycle #4 today as scheduled. He would come back for follow-up visit in 2 weeks with the start of cycle #5 after repeating CT scan of the chest, abdomen and pelvis for restaging of his disease. For the low back pain, he is currently on Percocet by Dr. Sondra Come than expected to start palliative radiotherapy next week. The patient was advised to call immediately if he has any concerning symptoms in the interval. The patient voices understanding of current disease status and treatment options and is in agreement with the current care plan.  All questions were answered.  The patient knows to call the clinic with any problems, questions or concerns. We can certainly see the patient much sooner if necessary.  Disclaimer: This note was dictated with voice recognition software. Similar sounding words can inadvertently be transcribed and may not be corrected upon review.

## 2016-02-08 NOTE — Progress Notes (Signed)
Radiation Oncology         [phone] ________________________________  Name: Alan [name] MRN: [id number]  Date: 02/08/2016  DOB: 1940/07/28  Re-evaluation Note  CC: [name], MD  Curt Bears, MD    ICD-9-CM ICD-10-CM   1. 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 Malignant neoplasm of right upper lobe of lung ([facility]) 162.3 C34.11   3. Bone metastasis ([facility]) 198.5 C79.51     Diagnosis:  Metastatic non-small cell lung cancer  Interval Since Last Radiation:  7 months   Patient received 60 gray in 30 fractions along with radiosensitizing chemotherapy directed at the right upper lung region  Narrative:  The patient returns today for consideration for additional radiation therapy as part of management of his non-small cell lung cancer.  He is seen out of the courtesy of Dr. [name]. Since patient's initial course of treatment he received consolidation with carboplatinum and Alimta. PET scan in June of this year showed T9 metastasis and a pulmonary nodule consistent with metastatic disease. Patient has been on Immunotherapy with  Nivolumab 240 MG IV  every 2 weeks and seems to be tolerating this well.  He is having increasing pain in the right pelvis area and has started relying on a sitting walker in light of his pain severity and instability.                        ALLERGIES:  is allergic to codeine.  Meds: Current Outpatient Prescriptions  Medication Sig Dispense Refill  . acetaminophen (TYLENOL) 500 MG tablet Take 1,000 mg by mouth every 4 (four) hours as needed.    [name] amLODipine (NORVASC) 5 MG tablet Take 1 tablet (5 mg total) by mouth every morning. 90 tablet 3  . aspirin EC 81 MG tablet Take 81 mg by mouth daily.     . brimonidine-timolol (COMBIGAN) 0.2-0.5 % ophthalmic solution Place 1 drop into both eyes every 12 (twelve) hours.    . finasteride ([name]) 5 MG tablet TAKE 1 TABLET BY MOUTH EVERY DAY 30 tablet 1  . fluticasone (FLONASE)  50 MCG/ACT nasal spray Place 2 sprays into both nostrils daily as needed for allergies or rhinitis. 48 g 3  . folic acid (FOLVITE) 1 MG tablet Take 1 tablet (1 mg total) by mouth daily. 30 tablet 2  . lactulose (CHRONULAC) 10 GM/15ML solution Take 10 g by mouth 2 (two) times daily as needed (constipation).     [name] levothyroxine (SYNTHROID, LEVOTHROID) 100 MCG tablet Take 1 tablet (100 mcg total) by mouth daily. 45 tablet 2  . Linaclotide (LINZESS) 290 MCG CAPS capsule Take 1 capsule (290 mcg total) by mouth daily. 30 capsule 3  . omeprazole (PRILOSEC) 20 MG capsule Take 1 capsule (20 mg total) by mouth every morning. 90 capsule 0  . polyethylene glycol (MIRALAX / GLYCOLAX) packet Take 17 g by mouth daily as needed (constipation). Reported on 06/21/2015    . prochlorperazine (COMPAZINE) 10 MG tablet Take 1 tablet (10 mg total) by mouth every 6 (six) hours as needed for nausea or vomiting. 30 tablet 0  . sildenafil (VIAGRA) 100 MG tablet Take 1 tablet (100 mg total) by mouth as directed. 10 tablet 0  . simvastatin (ZOCOR) 20 MG tablet Take 1 tablet (20 mg total) by mouth at bedtime. 90 tablet 3  . tamsulosin (FLOMAX) 0.4 MG CAPS capsule Take 2 capsules (0.8 mg total) by mouth at bedtime. 90 capsule 3  .  traMADol (ULTRAM) 50 MG tablet Take 1 tablet (50 mg total) by mouth every 6 (six) hours as needed for moderate pain. 30 tablet 0  . chlorpheniramine-HYDROcodone (TUSSIONEX) 10-8 MG/5ML SUER Take 5 mLs by mouth every 12 (twelve) hours as needed for cough. 140 mL 0  . chlorproMAZINE (THORAZINE) 25 MG tablet Take 1 tablet (25 mg total) by mouth 3 (three) times daily as needed for hiccoughs. (Patient not taking: Reported on 02/08/2016) 30 tablet 0  . dexamethasone (DECADRON) 4 MG tablet 4 mg by mouth twice a day the day before, day of and day after the chemotherapy every 3 weeks. (Patient not taking: Reported on 02/08/2016) 40 tablet 0  . guaiFENesin (MUCINEX) 600 MG 12 hr tablet Take 2 tablets (1,200 mg total) by  mouth 2 (two) times daily. 30 tablet 1  . meloxicam (MOBIC) 7.5 MG tablet Take 1 tablet (7.5 mg total) by mouth daily. (Patient not taking: Reported on 02/08/2016) 30 tablet 1  . oxyCODONE-acetaminophen (PERCOCET/ROXICET) 5-325 MG tablet Take 1 tablet by mouth every 4 (four) hours as needed for severe pain. 30 tablet 0  . sucralfate (CARAFATE) 1 g tablet Take 1 tablet (1 g total) by mouth 4 (four) times daily -  with meals and at bedtime. Mix in 2Tbs water, swish and swallow (Patient not taking: Reported on 02/08/2016) 60 tablet 0   No current facility-administered medications for this encounter.     Physical Findings: The patient is in no acute distress. Patient is alert and oriented.  height is '5\' 6"'$  (1.676 m) and weight is 152 lb (68.9 kg). His oral temperature is 97.5 F (36.4 C). His blood pressure is 114/64 and his pulse is 104 (abnormal). His oxygen saturation is 99%. .  No palpable subclavicular or axillary adenopathy. The lungs are clear to auscultation. The heart has regular rhythm and rate. On neurological examination motor strength is 5 out of 5 in the proximal and distal muscle groups of the upper and lower extremities. Patient points to pain in the right groin area as his most immediate area of concern as well as right buttocks region  Lab Findings: Lab Results  Component Value Date   WBC 5.2 02/08/2016   HGB 12.6 (L) 02/08/2016   HCT 38.0 (L) 02/08/2016   MCV 89.9 02/08/2016   PLT 441 (H) 02/08/2016    Radiographic Findings: No results found.  Impression:  Metastatic non-small cell lung cancer. I reviewed the patient's most recent PET scan with radiology and he does appear to have a significant metastatic deposit along the right anterior pelvis area which is resulted in a fracture. This is likely the etiology of his pain. The patient's lesion at T9 is very small and along the anterior vertebral body would not explain any pain in the right pelvis area. Patient would be a good  candidate for a short course of palliative radiation therapy directed at the right anterior pelvis area. I discussed the treatment course side effects and potential toxicities of radiation therapy in this situation with the patient. He appears to understand and wishes to proceed with planned course of treatment.  Plan:   Simulation and planning on August 10 with treatments to begin early next week. Anticipate approximately 10 treatments directed at the area of concern. The patient's pain severity (8/10) he is been given Percocet. He is also having dry cough which keeps him awake enough prescribe Tussionex. He will also start on guaifenesin. I did warn the patient a potential side effects with  these medications given his allergy to codeine but given the patient's pain complaints and coughing severity he wishes to try these medications.  ____________________________________ Gery Pray, MD

## 2016-02-08 NOTE — Progress Notes (Signed)
Please see the Nurse Progress Note in the MD Initial Consult Encounter for this patient. 

## 2016-02-08 NOTE — Patient Instructions (Signed)
[facility] Discharge Instructions for Patients Receiving Chemotherapy  Today you received the following chemotherapy agents Opdivo.  To help prevent nausea and vomiting after your treatment, we encourage you to take your nausea medication.   If you develop nausea and vomiting that is not controlled by your nausea medication, call the clinic.   BELOW ARE SYMPTOMS THAT SHOULD BE REPORTED IMMEDIATELY:  *FEVER GREATER THAN 100.5 F  *CHILLS WITH OR WITHOUT FEVER  NAUSEA AND VOMITING THAT IS NOT CONTROLLED WITH YOUR NAUSEA MEDICATION  *UNUSUAL SHORTNESS OF BREATH  *UNUSUAL BRUISING OR BLEEDING  TENDERNESS IN MOUTH AND THROAT WITH OR WITHOUT PRESENCE OF ULCERS  *URINARY PROBLEMS  *BOWEL PROBLEMS  UNUSUAL RASH Items with * indicate a potential emergency and should be followed up as soon as possible.  Feel free to call the clinic you have any questions or concerns. The clinic phone number is [phone].  Please show the CHEMO ALERT CARD at check-in to the Emergency Department and triage nurse.   

## 2016-02-08 NOTE — Addendum Note (Signed)
Encounter addended by: Jacqulyn Liner, RN on: 02/08/2016  1:33 PM<BR>    Actions taken: Charge Capture section accepted

## 2016-02-09 ENCOUNTER — Ambulatory Visit
Admission: RE | Admit: 2016-02-09 | Discharge: 2016-02-09 | Disposition: A | Discharge status: Home / Self Care | Payer: Medicare HMO | Source: Ambulatory Visit | Attending: Radiation Oncology | Admitting: Radiation Oncology

## 2016-02-09 DIAGNOSIS — Z510 Encounter for antineoplastic radiation therapy: Secondary | ICD-10-CM | POA: Diagnosis not present

## 2016-02-09 DIAGNOSIS — C7951 Secondary malignant neoplasm of bone: Secondary | ICD-10-CM

## 2016-02-13 ENCOUNTER — Inpatient Hospital Stay (HOSPITAL_COMMUNITY): Payer: Medicare HMO

## 2016-02-13 ENCOUNTER — Encounter (HOSPITAL_COMMUNITY): Payer: Self-pay | Admitting: Emergency Medicine

## 2016-02-13 ENCOUNTER — Emergency Department (HOSPITAL_COMMUNITY): Payer: Medicare HMO

## 2016-02-13 ENCOUNTER — Inpatient Hospital Stay (HOSPITAL_COMMUNITY)
Admission: EM | Admit: 2016-02-13 | Discharge: 2016-02-14 | DRG: 180 | Disposition: A | Discharge status: Home / Self Care | Payer: Medicare HMO | Attending: Internal Medicine | Admitting: Internal Medicine

## 2016-02-13 ENCOUNTER — Other Ambulatory Visit: Payer: Self-pay

## 2016-02-13 DIAGNOSIS — Z79899 Other long term (current) drug therapy: Secondary | ICD-10-CM

## 2016-02-13 DIAGNOSIS — N401 Enlarged prostate with lower urinary tract symptoms: Secondary | ICD-10-CM | POA: Diagnosis present

## 2016-02-13 DIAGNOSIS — Z7982 Long term (current) use of aspirin: Secondary | ICD-10-CM

## 2016-02-13 DIAGNOSIS — C61 Malignant neoplasm of prostate: Secondary | ICD-10-CM | POA: Diagnosis present

## 2016-02-13 DIAGNOSIS — E039 Hypothyroidism, unspecified: Secondary | ICD-10-CM | POA: Diagnosis present

## 2016-02-13 DIAGNOSIS — H409 Unspecified glaucoma: Secondary | ICD-10-CM | POA: Diagnosis present

## 2016-02-13 DIAGNOSIS — C3411 Malignant neoplasm of upper lobe, right bronchus or lung: Secondary | ICD-10-CM | POA: Diagnosis present

## 2016-02-13 DIAGNOSIS — R0600 Dyspnea, unspecified: Secondary | ICD-10-CM

## 2016-02-13 DIAGNOSIS — K219 Gastro-esophageal reflux disease without esophagitis: Secondary | ICD-10-CM | POA: Diagnosis present

## 2016-02-13 DIAGNOSIS — J90 Pleural effusion, not elsewhere classified: Secondary | ICD-10-CM | POA: Diagnosis not present

## 2016-02-13 DIAGNOSIS — N138 Other obstructive and reflux uropathy: Secondary | ICD-10-CM | POA: Diagnosis present

## 2016-02-13 DIAGNOSIS — C7951 Secondary malignant neoplasm of bone: Secondary | ICD-10-CM | POA: Diagnosis present

## 2016-02-13 DIAGNOSIS — Z87891 Personal history of nicotine dependence: Secondary | ICD-10-CM

## 2016-02-13 DIAGNOSIS — Z510 Encounter for antineoplastic radiation therapy: Secondary | ICD-10-CM | POA: Diagnosis present

## 2016-02-13 DIAGNOSIS — E7800 Pure hypercholesterolemia, unspecified: Secondary | ICD-10-CM | POA: Diagnosis present

## 2016-02-13 DIAGNOSIS — J9601 Acute respiratory failure with hypoxia: Secondary | ICD-10-CM | POA: Diagnosis present

## 2016-02-13 DIAGNOSIS — J910 Malignant pleural effusion: Secondary | ICD-10-CM | POA: Diagnosis present

## 2016-02-13 DIAGNOSIS — Z66 Do not resuscitate: Secondary | ICD-10-CM | POA: Diagnosis present

## 2016-02-13 DIAGNOSIS — N529 Male erectile dysfunction, unspecified: Secondary | ICD-10-CM | POA: Diagnosis present

## 2016-02-13 DIAGNOSIS — D638 Anemia in other chronic diseases classified elsewhere: Secondary | ICD-10-CM | POA: Diagnosis present

## 2016-02-13 DIAGNOSIS — Z9889 Other specified postprocedural states: Secondary | ICD-10-CM

## 2016-02-13 DIAGNOSIS — I10 Essential (primary) hypertension: Secondary | ICD-10-CM | POA: Diagnosis present

## 2016-02-13 DIAGNOSIS — Z923 Personal history of irradiation: Secondary | ICD-10-CM

## 2016-02-13 DIAGNOSIS — Z885 Allergy status to narcotic agent status: Secondary | ICD-10-CM | POA: Diagnosis not present

## 2016-02-13 DIAGNOSIS — R05 Cough: Secondary | ICD-10-CM | POA: Diagnosis not present

## 2016-02-13 LAB — CBC WITH DIFFERENTIAL/PLATELET
BASOS ABS: 0 10*3/uL (ref 0.0–0.1)
Basophils Relative: 0 %
EOS ABS: 0 10*3/uL (ref 0.0–0.7)
EOS PCT: 0 %
HCT: 31.2 % — ABNORMAL LOW (ref 39.0–52.0)
Hemoglobin: 10.5 g/dL — ABNORMAL LOW (ref 13.0–17.0)
LYMPHS PCT: 14 %
Lymphs Abs: 0.7 10*3/uL (ref 0.7–4.0)
MCH: 29.6 pg (ref 26.0–34.0)
MCHC: 33.7 g/dL (ref 30.0–36.0)
MCV: 87.9 fL (ref 78.0–100.0)
Monocytes Absolute: 0.5 10*3/uL (ref 0.1–1.0)
Monocytes Relative: 11 %
NEUTROS PCT: 75 %
Neutro Abs: 3.4 10*3/uL (ref 1.7–7.7)
PLATELETS: 431 10*3/uL — AB (ref 150–400)
RBC: 3.55 MIL/uL — AB (ref 4.22–5.81)
RDW: 13.4 % (ref 11.5–15.5)
WBC: 4.6 10*3/uL (ref 4.0–10.5)

## 2016-02-13 LAB — BASIC METABOLIC PANEL
Anion gap: 6 (ref 5–15)
BUN: 11 mg/dL (ref 6–20)
CO2: 25 mmol/L (ref 22–32)
Calcium: 8.7 mg/dL — ABNORMAL LOW (ref 8.9–10.3)
Chloride: 103 mmol/L (ref 101–111)
Creatinine, Ser: 0.93 mg/dL (ref 0.61–1.24)
Glucose, Bld: 94 mg/dL (ref 65–99)
POTASSIUM: 4.1 mmol/L (ref 3.5–5.1)
SODIUM: 134 mmol/L — AB (ref 135–145)

## 2016-02-13 LAB — BODY FLUID CELL COUNT WITH DIFFERENTIAL
Eos, Fluid: 1 %
LYMPHS FL: 36 %
MONOCYTE-MACROPHAGE-SEROUS FLUID: 24 % — AB (ref 50–90)
NEUTROPHIL FLUID: 39 % — AB (ref 0–25)
WBC FLUID: 1097 uL — AB (ref 0–1000)

## 2016-02-13 LAB — LACTATE DEHYDROGENASE: LDH: 139 U/L (ref 98–192)

## 2016-02-13 LAB — BODY FLUID CULTURE

## 2016-02-13 LAB — LACTATE DEHYDROGENASE, PLEURAL OR PERITONEAL FLUID: LD FL: 200 U/L — AB (ref 3–23)

## 2016-02-13 LAB — TROPONIN I

## 2016-02-13 LAB — PROTEIN, BODY FLUID: TOTAL PROTEIN, FLUID: 4.2 g/dL

## 2016-02-13 LAB — BRAIN NATRIURETIC PEPTIDE: B Natriuretic Peptide: 71.9 pg/mL (ref 0.0–100.0)

## 2016-02-13 LAB — GLUCOSE, SEROUS FLUID: Glucose, Fluid: 90 mg/dL

## 2016-02-13 LAB — PROTEIN, TOTAL: Total Protein: 6.5 g/dL (ref 6.5–8.1)

## 2016-02-13 MED ORDER — TRAMADOL HCL 50 MG PO TABS
50.0000 mg | ORAL_TABLET | Freq: Four times a day (QID) | ORAL | Status: DC | PRN
  Administered 2016-02-13: 50 mg via ORAL
  Filled 2016-02-13 (×2): qty 1

## 2016-02-13 MED ORDER — BRIMONIDINE TARTRATE 0.2 % OP SOLN
1.0000 [drp] | Freq: Two times a day (BID) | OPHTHALMIC | Status: DC
  Administered 2016-02-13 – 2016-02-14 (×2): 1 [drp] via OPHTHALMIC
  Filled 2016-02-13: qty 5

## 2016-02-13 MED ORDER — ALBUTEROL SULFATE (2.5 MG/3ML) 0.083% IN NEBU: 2.5000 mg | INHALATION_SOLUTION | RESPIRATORY_TRACT | Status: DC | PRN

## 2016-02-13 MED ORDER — ASPIRIN EC 81 MG PO TBEC
81.0000 mg | DELAYED_RELEASE_TABLET | Freq: Every day | ORAL | Status: DC
  Administered 2016-02-13 – 2016-02-14 (×2): 81 mg via ORAL
  Filled 2016-02-13 (×2): qty 1

## 2016-02-13 MED ORDER — CHLORPROMAZINE HCL 25 MG PO TABS
25.0000 mg | ORAL_TABLET | Freq: Three times a day (TID) | ORAL | Status: DC | PRN
  Filled 2016-02-13: qty 1

## 2016-02-13 MED ORDER — POLYETHYLENE GLYCOL 3350 17 G PO PACK: 17.0000 g | PACK | Freq: Every day | ORAL | Status: DC | PRN

## 2016-02-13 MED ORDER — LACTULOSE 10 GM/15ML PO SOLN: 10.0000 g | Freq: Two times a day (BID) | ORAL | Status: DC | PRN

## 2016-02-13 MED ORDER — BRIMONIDINE TARTRATE-TIMOLOL 0.2-0.5 % OP SOLN: 1.0000 [drp] | Freq: Two times a day (BID) | OPHTHALMIC | Status: DC

## 2016-02-13 MED ORDER — ONDANSETRON HCL 4 MG/2ML IJ SOLN: 4.0000 mg | Freq: Four times a day (QID) | INTRAMUSCULAR | Status: DC | PRN

## 2016-02-13 MED ORDER — LEVOTHYROXINE SODIUM 100 MCG PO TABS
100.0000 ug | ORAL_TABLET | Freq: Every day | ORAL | Status: DC
  Administered 2016-02-14: 100 ug via ORAL
  Filled 2016-02-13: qty 1

## 2016-02-13 MED ORDER — SODIUM CHLORIDE 0.9 % IV SOLN
INTRAVENOUS | Status: DC
  Administered 2016-02-13 – 2016-02-14 (×2): via INTRAVENOUS

## 2016-02-13 MED ORDER — HYDROCOD POLST-CPM POLST ER 10-8 MG/5ML PO SUER
5.0000 mL | Freq: Two times a day (BID) | ORAL | Status: DC | PRN
  Administered 2016-02-13: 5 mL via ORAL
  Filled 2016-02-13: qty 5

## 2016-02-13 MED ORDER — TIMOLOL MALEATE 0.5 % OP SOLN
1.0000 [drp] | Freq: Two times a day (BID) | OPHTHALMIC | Status: DC
  Administered 2016-02-13 – 2016-02-14 (×2): 1 [drp] via OPHTHALMIC
  Filled 2016-02-13: qty 5

## 2016-02-13 MED ORDER — LINACLOTIDE 290 MCG PO CAPS
290.0000 ug | ORAL_CAPSULE | Freq: Every day | ORAL | Status: DC
  Administered 2016-02-14: 290 ug via ORAL
  Filled 2016-02-13: qty 1

## 2016-02-13 MED ORDER — SUCRALFATE 1 G PO TABS
1.0000 g | ORAL_TABLET | Freq: Three times a day (TID) | ORAL | Status: DC
  Administered 2016-02-13 (×2): 1 g via ORAL
  Filled 2016-02-13 (×3): qty 1

## 2016-02-13 MED ORDER — HEPARIN SODIUM (PORCINE) 5000 UNIT/ML IJ SOLN
5000.0000 [IU] | Freq: Three times a day (TID) | INTRAMUSCULAR | Status: DC
  Administered 2016-02-13 – 2016-02-14 (×2): 5000 [IU] via SUBCUTANEOUS
  Filled 2016-02-13 (×2): qty 1

## 2016-02-13 MED ORDER — TAMSULOSIN HCL 0.4 MG PO CAPS
0.8000 mg | ORAL_CAPSULE | Freq: Every day | ORAL | Status: DC
  Administered 2016-02-13: 0.8 mg via ORAL
  Filled 2016-02-13: qty 2

## 2016-02-13 MED ORDER — FOLIC ACID 1 MG PO TABS
1.0000 mg | ORAL_TABLET | Freq: Every day | ORAL | Status: DC
  Administered 2016-02-13 – 2016-02-14 (×2): 1 mg via ORAL
  Filled 2016-02-13 (×2): qty 1

## 2016-02-13 MED ORDER — IOPAMIDOL (ISOVUE-370) INJECTION 76%
100.0000 mL | Freq: Once | INTRAVENOUS | Status: AC | PRN
  Administered 2016-02-13: 100 mL via INTRAVENOUS

## 2016-02-13 MED ORDER — ONDANSETRON HCL 4 MG PO TABS: 4.0000 mg | ORAL_TABLET | Freq: Four times a day (QID) | ORAL | Status: DC | PRN

## 2016-02-13 MED ORDER — HYDROCODONE-ACETAMINOPHEN 5-325 MG PO TABS
1.0000 | ORAL_TABLET | ORAL | Status: DC | PRN
  Administered 2016-02-13 (×3): 1 via ORAL
  Administered 2016-02-14: 2 via ORAL
  Filled 2016-02-13: qty 2
  Filled 2016-02-13 (×2): qty 1
  Filled 2016-02-13: qty 2

## 2016-02-13 MED ORDER — FINASTERIDE 5 MG PO TABS
5.0000 mg | ORAL_TABLET | Freq: Every day | ORAL | Status: DC
  Administered 2016-02-13 – 2016-02-14 (×2): 5 mg via ORAL
  Filled 2016-02-13 (×2): qty 1

## 2016-02-13 MED ORDER — PANTOPRAZOLE SODIUM 40 MG PO TBEC
40.0000 mg | DELAYED_RELEASE_TABLET | Freq: Every day | ORAL | Status: DC
Start: 2016-02-13 — End: 2016-02-14
  Administered 2016-02-13 – 2016-02-14 (×2): 40 mg via ORAL
  Filled 2016-02-13 (×2): qty 1

## 2016-02-13 MED ORDER — AMLODIPINE BESYLATE 5 MG PO TABS
5.0000 mg | ORAL_TABLET | Freq: Every morning | ORAL | Status: DC
  Administered 2016-02-13 – 2016-02-14 (×2): 5 mg via ORAL
  Filled 2016-02-13 (×2): qty 1

## 2016-02-13 MED ORDER — SIMVASTATIN 20 MG PO TABS
20.0000 mg | ORAL_TABLET | Freq: Every day | ORAL | Status: DC
  Administered 2016-02-13: 20 mg via ORAL
  Filled 2016-02-13: qty 1

## 2016-02-13 NOTE — ED Provider Notes (Signed)
[facility] DEPT Provider Note   CSN: [id number] Arrival date & time: 02/13/16  7106  First Provider Contact:  First MD Initiated Contact with Patient 02/13/16 [phone]        History   Chief Complaint Chief Complaint  Patient presents with  . Cough    HPI Alan [name] is a 75 y.o. male.  HPI   Pt is a 75 y/o male with pmhx of metastatic non-small cell lung cancer, currently tx with chemo, radiation and immunotherapy, also pertinent pmhx of gerd, htn, hypercholesteremia, he presents to the ER with complaint of 8-9 days of gradually worsening SOB with dry cough.  He has generalized pain in low back and hips, which is near his baseline, treated with chronic narcotic pain meds, worse with movement.  He denies orthopnea, pnd, LE edema, hemoptysis.  No exertional CP. SOB with minimal exertion.  He reports 6 lb weight loss in the past week.  Denies fever, sweats, chills, rash, URI sx.  No sick contacts.    Past Medical History:  Diagnosis Date  . Anxiety state, unspecified   . Benign hypertrophy of prostate   . Benign neoplasm of colon    colon polyps  . Cancer [facility])    prostate cancer  . Chronic fatigue 12/23/2015  . Constipation, chronic   . Diverticulosis of colon (without mention of hemorrhage)   . Encounter for antineoplastic chemotherapy 09/29/2015  . Encounter for antineoplastic immunotherapy 12/23/2015  . Erectile dysfunction   . GERD (gastroesophageal reflux disease)   . Glaucoma   . Hypercholesteremia   . Hypothyroidism   . Irritable bowel syndrome   . Left hip pain 10/27/2015  . Onychomycosis   . Radiation 06/20/15-08/02/15   right upper lung region 60 gray  . Restless legs syndrome (RLS)   . Sleep apnea    does not use cpap  . Unspecified essential hypertension     Patient Active Problem List   Diagnosis Date Noted  . Acute hypoxemic respiratory failure ([place]) 02/13/2016  . Pleural effusion 02/13/2016  . Bone metastasis ([place]) 02/08/2016  . Chronic fatigue  12/23/2015  . Encounter for antineoplastic immunotherapy 12/23/2015  . Left hip pain 10/27/2015  . Encounter for antineoplastic chemotherapy 09/29/2015  . Malignant neoplasm of right upper lobe of lung ([place]) 06/03/2015  . Lung mass 06/01/2015  . Constipation 04/21/2015  . 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 Gynecomastia, male 10/20/2014  . Decreased hearing 10/20/2014  . Prostate cancer ([place]) 10/20/2014  . Acute appendicitis with peritoneal abscess 11/13/2012  . CHEST WALL PAIN, ANTERIOR 05/29/2010  . OTHER CONSTIPATION 07/12/2009  . ONYCHOMYCOSIS 10/15/2008  . Anxiety state 10/15/2008  . 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 OBSTRUCTIVE SLEEP APNEA 09/21/2007  . RESTLESS LEG SYNDROME 09/21/2007  . Diverticulosis of large intestine 09/21/2007  . HYPERCHOLESTEROLEMIA 09/11/2007  . ERECTILE DYSFUNCTION 09/11/2007  . Essential hypertension 09/11/2007  . GERD 09/11/2007  . IRRITABLE BOWEL SYNDROME 09/11/2007  . BPH (benign prostatic hypertrophy) with urinary obstruction 09/11/2007  . COLONIC POLYPS 10/30/2005    Past Surgical History:  Procedure Laterality Date  . APPENDECTOMY  12/2012  . CARDIAC CATHETERIZATION  ? 2008  . COLONOSCOPY    . LAPAROSCOPIC APPENDECTOMY N/A 01/27/2013   Procedure: APPENDECTOMY LAPAROSCOPIC;  Surgeon: Edward Jolly, MD;  Location: [facility];  Service: General;  Laterality: N/A;  . MEDIASTINOSCOPY N/A 05/31/2015   Procedure: MEDIASTINOSCOPY;  Surgeon: Gaye Pollack, MD;  Location: [place];  Service: Thoracic;  Laterality: N/A;  . none    .  THYROIDECTOMY    . TONSILLECTOMY    . VIDEO BRONCHOSCOPY N/A 05/31/2015   Procedure: VIDEO BRONCHOSCOPY;  Surgeon: Gaye Pollack, MD;  Location: [facility] OR;  Service: Thoracic;  Laterality: N/A;       Home Medications    Prior to Admission medications   Medication Sig Start Date End Date Taking? Authorizing Provider  acetaminophen (TYLENOL) 500 MG tablet Take 1,000 mg by mouth every 4 (four) hours as needed for mild  pain, moderate pain, fever or headache.    Yes Historical Provider, MD  amLODipine (NORVASC) 5 MG tablet Take 1 tablet (5 mg total) by mouth every morning. 11/17/15  Yes [name], MD  aspirin EC 81 MG tablet Take 81 mg by mouth daily.    Yes Historical Provider, MD  brimonidine-timolol (COMBIGAN) 0.2-0.5 % ophthalmic solution Place 1 drop into both eyes every 12 (twelve) hours.   Yes Historical Provider, MD  chlorpheniramine-HYDROcodone (TUSSIONEX) 10-8 MG/5ML SUER Take 5 mLs by mouth every 12 (twelve) hours as needed for cough. 02/08/16  Yes Gery Pray, MD  chlorproMAZINE (THORAZINE) 25 MG tablet Take 1 tablet (25 mg total) by mouth 3 (three) times daily as needed for hiccoughs. 11/24/15  Yes Curt Bears, MD  Cholecalciferol (VITAMIN D PO) Take 500 Units by mouth daily.   Yes Historical Provider, MD  dexamethasone (DECADRON) 4 MG tablet 4 mg by mouth twice a day the day before, day of and day after the chemotherapy every 3 weeks. 09/29/15  Yes Curt Bears, MD  finasteride ([name]) 5 MG tablet TAKE 1 TABLET BY MOUTH EVERY DAY 12/16/15  Yes [name], MD  fluticasone [facility]) 50 MCG/ACT nasal spray Place 2 sprays into both nostrils daily as needed for allergies or rhinitis. 11/17/15  Yes [name], MD  folic acid (FOLVITE) 1 MG tablet Take 1 tablet (1 mg total) by mouth daily. 09/29/15  Yes Curt Bears, MD  guaiFENesin (MUCINEX) 600 MG 12 hr tablet Take 2 tablets (1,200 mg total) by mouth 2 (two) times daily. 02/08/16  Yes Gery Pray, MD  lactulose (CHRONULAC) 10 GM/15ML solution Take 10 g by mouth 2 (two) times daily as needed (constipation).    Yes Historical Provider, MD  levothyroxine (SYNTHROID, LEVOTHROID) 100 MCG tablet Take 1 tablet (100 mcg total) by mouth daily. 11/02/15  Yes Philemon Kingdom, MD  Linaclotide [facility]) 290 MCG CAPS capsule Take 1 capsule (290 mcg total) by mouth daily. 06/07/15  Yes Mauri Pole, MD  meloxicam (MOBIC) 7.5 MG tablet Take 1 tablet (7.5  mg total) by mouth daily. 11/17/15  Yes [name], MD  Multiple Vitamins-Minerals (CENTRUM SILVER 50+MEN) TABS Take 1 tablet by mouth daily.   Yes Historical Provider, MD  omeprazole (PRILOSEC) 20 MG capsule Take 1 capsule (20 mg total) by mouth every morning. 11/17/15  Yes [name], MD  oxyCODONE-acetaminophen (PERCOCET/ROXICET) 5-325 MG tablet Take 1 tablet by mouth every 4 (four) hours as needed for severe pain. 02/08/16  Yes Gery Pray, MD  polyethylene glycol [facility] / GLYCOLAX) packet Take 17 g by mouth daily as needed (constipation). Reported on 06/21/2015   Yes Historical Provider, MD  prochlorperazine (COMPAZINE) 10 MG tablet Take 1 tablet (10 mg total) by mouth every 6 (six) hours as needed for nausea or vomiting. 06/04/15  Yes Curt Bears, MD  sildenafil (VIAGRA) 100 MG tablet Take 1 tablet (100 mg total) by mouth as directed. Patient taking differently: Take 100 mg by mouth as needed for erectile  dysfunction.  11/17/15  Yes [name], MD  simvastatin (ZOCOR) 20 MG tablet Take 1 tablet (20 mg total) by mouth at bedtime. 11/17/15  Yes [name], MD  sucralfate (CARAFATE) 1 g tablet Take 1 tablet (1 g total) by mouth 4 (four) times daily -  with meals and at bedtime. Mix in 2Tbs water, swish and swallow 08/01/15  Yes Hayden Pedro, PA-C  tamsulosin (FLOMAX) 0.4 MG CAPS capsule Take 2 capsules (0.8 mg total) by mouth at bedtime. 11/17/15  Yes [name], MD  traMADol (ULTRAM) 50 MG tablet Take 1 tablet (50 mg total) by mouth every 6 (six) hours as needed for moderate pain. 12/23/15  Yes Curt Bears, MD    Family History Family History  Problem Relation Age of Onset  . Cancer Father     prostate  . Colon cancer Paternal Grandfather 49  . Cancer Paternal Grandfather     prostate  . Cancer Brother     prostate    Social History Social History  Substance Use Topics  . Smoking status: Former Smoker    Packs/day: 2.00    Years: 15.00    Types: Cigarettes     Quit date: 07/02/1968  . Smokeless tobacco: Former Systems developer    Types: Chew, Snuff  . Alcohol use No     Allergies   Codeine   Review of Systems Review of Systems  All other systems reviewed and are negative.    Physical Exam Updated Vital Signs BP (!) 94/54 (BP Location: Right Arm)   Pulse 84   Temp 98.2 F (36.8 C) (Oral)   Resp 16   Ht '5\' 6"'$  (1.676 m)   Wt 62.4 kg   SpO2 95%   BMI 22.19 kg/m   Physical Exam  Constitutional: He is oriented to person, place, and time. He appears well-developed and well-nourished. No distress.  HENT:  Head: Normocephalic and atraumatic.  Nose: Nose normal.  Mouth/Throat: Oropharynx is clear and moist.  Eyes: Conjunctivae and EOM are normal. Pupils are equal, round, and reactive to light. Right eye exhibits no discharge. Left eye exhibits no discharge.  Neck: Normal range of motion. Neck supple. No JVD present. No tracheal deviation present.  Cardiovascular: Normal rate, regular rhythm, normal heart sounds and intact distal pulses.  Exam reveals no gallop and no friction rub.   No murmur heard. Pulmonary/Chest: No stridor. No respiratory distress. He has no wheezes. He has no rales. He exhibits no tenderness.  Pt mildly tachypneic, no retractions, decreased BS throughout the right lung, dull to percussion, normal BS in left lung fields w/o wheeze, rales or rhonchi.  Abdominal: Soft. Bowel sounds are normal. He exhibits no distension and no mass. There is no tenderness. There is no guarding.  Musculoskeletal: Normal range of motion.  Neurological: He is alert and oriented to person, place, and time. He exhibits normal muscle tone. Coordination normal.  Skin: Skin is warm and dry. Capillary refill takes less than 2 seconds. He is not diaphoretic. No erythema. No pallor.  Psychiatric: He has a normal mood and affect. His behavior is normal. Judgment and thought content normal.  Nursing note and vitals reviewed.    ED Treatments / Results    Labs (all labs ordered are listed, but only abnormal results are displayed) Labs Reviewed  BASIC METABOLIC PANEL - Abnormal; Notable for the following:       Result Value   Sodium 134 (*)    Calcium 8.7 (*)  All other components within normal limits  CBC WITH DIFFERENTIAL/PLATELET - Abnormal; Notable for the following:    RBC 3.55 (*)    Hemoglobin 10.5 (*)    HCT 31.2 (*)    Platelets 431 (*)    All other components within normal limits  BODY FLUID CELL COUNT WITH DIFFERENTIAL - Abnormal; Notable for the following:    Appearance, Fluid HAZY (*)    WBC, Fluid 1,097 (*)    Neutrophil Count, Fluid 39 (*)    Monocyte-Macrophage-Serous Fluid 24 (*)    All other components within normal limits  LACTATE DEHYDROGENASE, BODY FLUID - Abnormal; Notable for the following:    LD, Fluid 200 (*)    All other components within normal limits  BASIC METABOLIC PANEL - Abnormal; Notable for the following:    Calcium 8.3 (*)    All other components within normal limits  CBC - Abnormal; Notable for the following:    RBC 3.37 (*)    Hemoglobin 9.8 (*)    HCT 29.7 (*)    Platelets 413 (*)    All other components within normal limits  BODY FLUID CULTURE  CULTURE, BODY FLUID-BOTTLE  TROPONIN I  BRAIN NATRIURETIC PEPTIDE  LACTATE DEHYDROGENASE  PROTEIN, TOTAL  PROTEIN, BODY FLUID  GLUCOSE, SEROUS FLUID  AMYLASE, PLEURAL FLUID  TRIGLYCERIDES, BODY FLUIDS  PH, BODY FLUID  CYTOLOGY - NON PAP  CYTOLOGY - NON PAP    EKG  EKG Interpretation  Date/Time:  Monday February 13 2016 06:51:19 EDT Ventricular Rate:  103 PR Interval:    QRS Duration: 84 QT Interval:  341 QTC Calculation: 447 R Axis:   95 Text Interpretation:  Sinus tachycardia Right axis deviation Borderline low voltage, extremity leads Confirmed by DELO  MD, DOUGLAS ([id number]) on 02/13/2016 6:59:07 AM       Radiology Dg Chest 1 View  Result Date: 02/13/2016 CLINICAL DATA:  75 year old male status post thoracentesis EXAM: CHEST  1 VIEW COMPARISON:  X-ray same day, CT same day FINDINGS: Cardiomediastinal silhouette unchanged. Improved visualization of the right heart border, although the right hemidiaphragm and right cardiophrenic angle are obscured. Similar appearance of right suprahilar density. Improved opacity at the right base status post thoracentesis. Uplifting of the minor fissure persist. Calcifications. No pneumothorax. IMPRESSION: Status post right thoracentesis without pneumothorax. Improved right-sided pleural fluid. Signed, Dulcy Fanny. Earleen Newport, DO [facility] Radiology Electronically Signed   By: Corrie Mckusick D.O.   On: 02/13/2016 12:47   Dg Chest 2 View  Result Date: 02/13/2016 CLINICAL DATA:  75 year old male with a history of increasing cough and shortness of breath over 3 days. History of lung cancer EXAM: CHEST  2 VIEW COMPARISON:  PET-CT 12/19/2015, chest CT 12/01/2015, prior chest x-ray 05/31/2015 FINDINGS: Right-sided mediastinal and cardiac border obscured by overlying lung/ pleural disease. Opacity in the right suprahilar region involving the mediastinal border and the right upper lobe. Uplifting of the minor fissure. Dense opacity at the right base obscures the right hemidiaphragm and the right heart border. No pneumothorax. Dense opacity on the lateral view at the posterior base. Trace opacity at the posterior left base. IMPRESSION: Dense opacity at the right base, likely a combination of volume loss, atelectasis/consolidation, and pleural effusion in this patient with known carcinoma. Opacity in the right suprahilar region, potentially a combination of residual disease and/or treatment effects. Aortic atherosclerosis. Signed, Dulcy Fanny. Earleen Newport, DO [facility] Radiology Electronically Signed   By: Corrie Mckusick  D.O.   On: 02/13/2016 07:17   Ct Angio Chest Pe W And/or Wo Contrast  Result Date: 02/13/2016 CLINICAL  DATA:  75 year old male with a history of cough for 2 weeks EXAM: CT ANGIOGRAPHY CHEST WITH CONTRAST TECHNIQUE: Multidetector CT imaging of the chest was performed using the standard protocol during bolus administration of intravenous contrast. Multiplanar CT image reconstructions and MIPs were obtained to evaluate the vascular anatomy. CONTRAST:  100 cc Isovue 370 COMPARISON:  PET-CT 12/19/2015, CT 12/01/2015 FINDINGS: Chest: Asymmetric breast tissue with left breast tissue measuring 4.7 cm in greatest diameter. This was present on the comparison PET-CT. Bilateral axillary lymph nodes. None of these are enlarged by CT size criteria. No significant supraclavicular adenopathy. Mediastinal structures are shifted from right to left. Central airways are patent to the carina. Bronchus intermedius patent. Proximal bronchi of the right lower lobe are partially occluded. Left-sided airways are clear. Lungs: Right:  Interval development of large right-sided pleural effusion. Lateral segment of the middle lobe demonstrates complete collapse. 50% atelectasis of the right lower lobe. Dense airspace opacity/nodular consolidation of the right upper lobe. Hypodense focus in the region of the treated tumor measures 2.8 cm x 2.4 cm. There are a few adjacent small nodules which were present on the comparison PET-CT. Left: Ground-glass opacity of the left apex more pronounced than the comparison PET-CT now measuring 11 mm. Pleural based nodule of the left upper lobe anteriorly measures 8 mm, increased from the prior. New left lower lobe nodules (image 72), (image 75) measuring 5 mm and 4 mm. Trace left-sided pleural effusion. Vascular: Unremarkable course caliber and contour of the thoracic aorta with no aneurysm or dissection flap. Scattered calcifications. Calcifications of the branch vessels. No central, lobar, segmental pulmonary artery filling defects. Distal to the segmental vessels, study limited by the motion artifact and  contrast bolus. Heart size unchanged.  Trace pericardial fluid/thickening. Musculoskeletal: Small lucent lesion of the T5 right transverse process, new from prior. Sclerotic lesion of the T10 vertebral body, similar to prior. Upper abdomen: Unremarkable appearance of the visualized upper abdomen. Review of the MIP images confirms the above findings. IMPRESSION: New large right-sided pleural effusion contributing to right to left midline shift. There is associated near complete collapse of the right lower lobe, with minimal aeration of the right upper lobe, right middle lobe, and right lower lobe. New nodular soft tissue/consolidation of the right upper lobe in the region of the previous identified right upper lobe tumor, status post initiation of treatment. Given the time interval from the comparison CT of 47/42/5956, is uncertain to what degree these changes represent treatment treatment effects versus progression. At least some of these are favored to represent progression given the appearance of the contralateral lung and evidence of skeletal metastases. Interval enlargement of left lung nodules and development of new nodules compatible with pulmonary progression of metastatic disease. New lucent lesion of the right T5 transverse process. Re- demonstration of T10 vertebral body sclerotic lesion. Asymmetric soft tissue of the left breast, presumably gynecomastia. Trace pericardial effusion. Small left-sided pleural effusion. Aortic atherosclerosis. Signed, Dulcy Fanny. Earleen Newport, DO [facility] Radiology Electronically Signed   By: Corrie Mckusick D.O.   On: 02/13/2016 08:12   US Thoracentesis Asp Pleural Space W/img Guide  Result Date: 02/13/2016 INDICATION: Patient with history of metastatic lung cancer, right greater than left pleural effusions. Request made for diagnostic and therapeutic right thoracentesis. EXAM: ULTRASOUND GUIDED DIAGNOSTIC AND THERAPEUTIC RIGHT  THORACENTESIS MEDICATIONS: None. COMPLICATIONS:  None immediate. PROCEDURE: An ultrasound guided thoracentesis was thoroughly discussed with the patient and questions answered. The benefits, risks, alternatives and complications were also discussed. The patient understands and wishes to proceed with the procedure. Written consent was obtained. Ultrasound was performed to localize and mark an adequate pocket of fluid in the right chest. The area was then prepped and draped in the normal sterile fashion. 1% Lidocaine was used for local anesthesia. Under ultrasound guidance a Safe-T-Centesis catheter was introduced. Thoracentesis was performed. The catheter was removed and a dressing applied. FINDINGS: A total of approximately 1.7 liters of slightly hazy, yellow fluid was removed. Samples were sent to the laboratory as requested by the clinical team. Due to this being patient's initial thoracentesis only the above amount of fluid was removed at this time. IMPRESSION: Successful ultrasound guided diagnostic and therapeutic right thoracentesis yielding 1.7 liters of pleural fluid. Read by: Rowe Ayodele, PA-C Electronically Signed   By: Lucrezia Europe M.D.   On: 02/13/2016 12:45    Procedures Procedures (including critical care time)  Medications Ordered in ED Medications  amLODipine (NORVASC) tablet 5 mg (5 mg Oral Given 02/14/16 0826)  aspirin EC tablet 81 mg (81 mg Oral Given 02/14/16 0826)  chlorpheniramine-HYDROcodone (TUSSIONEX) 10-8 MG/5ML suspension 5 mL (5 mLs Oral Given 02/13/16 2056)  chlorproMAZINE (THORAZINE) tablet 25 mg (not administered)  finasteride ([name]) tablet 5 mg (5 mg Oral Given 0/10/27 2536)  folic acid (FOLVITE) tablet 1 mg (1 mg Oral Given 02/14/16 0826)  tamsulosin (FLOMAX) capsule 0.8 mg (0.8 mg Oral Given 02/13/16 2124)  traMADol (ULTRAM) tablet 50 mg (50 mg Oral Given 02/13/16 2056)  sucralfate (CARAFATE) tablet 1 g (1 g Oral Not Given 02/14/16 0826)  simvastatin (ZOCOR) tablet 20 mg  (20 mg Oral Given 02/13/16 2126)  polyethylene glycol (MIRALAX / GLYCOLAX) packet 17 g (not administered)  pantoprazole (PROTONIX) EC tablet 40 mg (40 mg Oral Given 02/14/16 0826)  linaclotide (LINZESS) capsule 290 mcg (290 mcg Oral Given 02/14/16 0643)  levothyroxine (SYNTHROID, LEVOTHROID) tablet 100 mcg (100 mcg Oral Given 02/14/16 0353)  lactulose (CHRONULAC) 10 GM/15ML solution 10 g (not administered)  HYDROcodone-acetaminophen (NORCO/VICODIN) 5-325 MG per tablet 1-2 tablet (2 tablets Oral Given 02/14/16 0353)  ondansetron (ZOFRAN) tablet 4 mg (not administered)    Or  ondansetron (ZOFRAN) injection 4 mg (not administered)  heparin injection 5,000 Units (5,000 Units Subcutaneous Given 02/14/16 0643)  0.9 %  sodium chloride infusion ( Intravenous New Bag/Given 02/14/16 0353)  albuterol (PROVENTIL) (2.5 MG/3ML) 0.083% nebulizer solution 2.5 mg (not administered)  brimonidine (ALPHAGAN) 0.2 % ophthalmic solution 1 drop (1 drop Both Eyes Given 02/14/16 0827)    And  timolol (TIMOPTIC) 0.5 % ophthalmic solution 1 drop (1 drop Both Eyes Given 02/14/16 0827)  iopamidol (ISOVUE-370) 76 % injection 100 mL (100 mLs Intravenous Contrast Given 02/13/16 0715)     Initial Impression / Assessment and Plan / ED Course  I have reviewed the triage vital signs and the nursing notes.  Pertinent labs & imaging results that were available during my care of the patient were reviewed by me and considered in my medical decision making (see chart for details).   Patient with metastatic non-small cell lung cancer, presents with 8 days of dyspnea and cough. Clinically suspicious for a pleural effusion on the right side, which correlates with chest x-ray. Patient never had an effusion before.  Interventional radiology consulted for ultrasound-guided thoracentesis, which would be therapeutic and diagnostic although I suspect effusion is exudative  secondary to known lung cancer.  CT of chest was negative for PE.  With  minimal exertion and with coughing fits patient does become slightly hypoxic, he was ambulate down the hallway but did drop his oxygen saturations and was extremely dyspneic and tachypneic.  Patient placed on 2 L nasal cannula, patient will need to be admitted for observation.    Dr. Candiss Norse to admit for obs - tele   Final Clinical Impressions(s) / ED Diagnoses   Final diagnoses:  Dyspnea  Pleural effusion    New Prescriptions Current Discharge Medication List       Delsa Grana, PA-C 02/14/16 [place], MD 02/16/16 1005

## 2016-02-13 NOTE — ED Notes (Signed)
Patient transported to Ultrasound 

## 2016-02-13 NOTE — Progress Notes (Signed)
MD notified of results of body fluid culture.

## 2016-02-13 NOTE — H&P (Signed)
TRH H&P   Patient Demographics:    Alan [name], is a 75 y.o. male  MRN: [id number]   DOB - [birth year]  Admit Date - 02/13/2016  Outpatient Primary MD for the patient is [name], MD  Outpatient Specialists: Dr Inda Merlin    Patient coming from: Home  Chief Complaint  Patient presents with  . Cough      HPI:    Alan [name]  is a 75 y.o. 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 [name] undergoing chemotherapy for close to 6 months and now to resume radiation therapy for his bone lesion, essential hypertension, BPH, hypothyroidism, glaucoma, GERD, erectile dysfunction remote history of smoking who comes to the hospital with 2-3 day history of cough and exertional shortness of breath which is gradually getting worse. No fever chills. No chest pain. No other subjective complaints. Symptoms were worse with exertion and better with rest,  In the ER workup suggests of of large right-sided pleural effusion, I was called to admit the patient for hypoxic respiratory failure caused by pleural effusion.    Review of systems:    In addition to the HPI above,   No Fever-chills, No Headache, No changes with Vision or hearing, No problems swallowing food or Liquids, No Chest pain, Cough and shortness of breath as above, No Abdominal pain, No Nausea or Vommitting, Bowel movements are regular, No Blood in stool or Urine, No dysuria, No new skin rashes or bruises, No new joints pains-aches,  No new weakness, tingling, numbness in any extremity, No recent weight gain or loss, No polyuria, polydypsia or polyphagia, No  significant Mental Stressors.  A full 10 point Review of Systems was done, except as stated above, all other Review of Systems were negative.   With Past History of the following :    Past Medical History:  Diagnosis Date  . Anxiety state, unspecified   . Benign hypertrophy of prostate   . Benign neoplasm of colon    colon polyps  . Cancer [facility])    prostate cancer  . Chronic fatigue 12/23/2015  . Constipation, chronic   . Diverticulosis of colon (without mention of hemorrhage)   . Encounter for antineoplastic chemotherapy 09/29/2015  . Encounter for antineoplastic immunotherapy 12/23/2015  .  Erectile dysfunction   . GERD (gastroesophageal reflux disease)   . Glaucoma   . Hypercholesteremia   . Hypothyroidism   . Irritable bowel syndrome   . Left hip pain 10/27/2015  . Onychomycosis   . Radiation 06/20/15-08/02/15   right upper lung region 60 gray  . Restless legs syndrome (RLS)   . Sleep apnea    does not use cpap  . Unspecified essential hypertension       Past Surgical History:  Procedure Laterality Date  . APPENDECTOMY  12/2012  . CARDIAC CATHETERIZATION  ? 2008  . COLONOSCOPY    . LAPAROSCOPIC APPENDECTOMY N/A 01/27/2013   Procedure: APPENDECTOMY LAPAROSCOPIC;  Surgeon: Edward Jolly, MD;  Location: [facility];  Service: General;  Laterality: N/A;  . MEDIASTINOSCOPY N/A 05/31/2015   Procedure: MEDIASTINOSCOPY;  Surgeon: Gaye Pollack, MD;  Location: [place];  Service: Thoracic;  Laterality: N/A;  . none    . THYROIDECTOMY    . TONSILLECTOMY    . VIDEO BRONCHOSCOPY N/A 05/31/2015   Procedure: VIDEO BRONCHOSCOPY;  Surgeon: Gaye Pollack, MD;  [facility] OR;  Service: Thoracic;  Laterality: N/A;      Social History:     Social History  Substance Use Topics  . Smoking status: Former Smoker    Packs/day: 2.00    Years: 15.00    Types: Cigarettes    Quit date: 07/02/1968  . Smokeless tobacco: Former Systems developer    Types: Chew, Snuff  . Alcohol use No        Family History :     Family History  Problem Relation Age of Onset  . Cancer Father     prostate  . Colon cancer Paternal Grandfather 22  . Cancer Paternal Grandfather     prostate  . Cancer Brother     prostate       Home Medications:   Prior to Admission medications   Medication Sig Start Date End Date Taking? Authorizing Provider  acetaminophen (TYLENOL) 500 MG tablet Take 1,000 mg by mouth every 4 (four) hours as needed for mild pain, moderate pain, fever or headache.    Yes Historical Provider, MD  amLODipine (NORVASC) 5 MG tablet Take 1 tablet (5 mg total) by mouth every morning. 11/17/15  Yes [name], MD  aspirin EC 81 MG tablet Take 81 mg by mouth daily.    Yes Historical Provider, MD  brimonidine-timolol (COMBIGAN) 0.2-0.5 % ophthalmic solution Place 1 drop into both eyes every 12 (twelve) hours.   Yes Historical Provider, MD  chlorpheniramine-HYDROcodone (TUSSIONEX) 10-8 MG/5ML SUER Take 5 mLs by mouth every 12 (twelve) hours as needed for cough. 02/08/16  Yes Gery Pray, MD  chlorproMAZINE (THORAZINE) 25 MG tablet Take 1 tablet (25 mg total) by mouth 3 (three) times daily as needed for hiccoughs. 11/24/15  Yes Curt Bears, MD  Cholecalciferol (VITAMIN D PO) Take 500 Units by mouth daily.   Yes Historical Provider, MD  dexamethasone (DECADRON) 4 MG tablet 4 mg by mouth twice a day the day before, day of and day after the chemotherapy every 3 weeks. 09/29/15  Yes Curt Bears, MD  finasteride ([name]) 5 MG tablet TAKE 1 TABLET BY MOUTH EVERY DAY 12/16/15  Yes [name], MD  fluticasone [facility]) 50 MCG/ACT nasal spray Place 2 sprays into both nostrils daily as needed for allergies or rhinitis. 11/17/15  Yes [name], MD  folic acid (FOLVITE) 1 MG tablet Take 1 tablet (1 mg total) by  mouth daily. 09/29/15  Yes Curt Bears, MD  guaiFENesin (MUCINEX) 600 MG 12 hr tablet Take 2 tablets (1,200 mg total) by mouth 2 (two) times daily. 02/08/16  Yes Gery Pray, MD    lactulose (CHRONULAC) 10 GM/15ML solution Take 10 g by mouth 2 (two) times daily as needed (constipation).    Yes Historical Provider, MD  levothyroxine (SYNTHROID, LEVOTHROID) 100 MCG tablet Take 1 tablet (100 mcg total) by mouth daily. 11/02/15  Yes Philemon Kingdom, MD  Linaclotide Rolan Lipa) 290 MCG CAPS capsule Take 1 capsule (290 mcg total) by mouth daily. 06/07/15  Yes Mauri Pole, MD  meloxicam (MOBIC) 7.5 MG tablet Take 1 tablet (7.5 mg total) by mouth daily. 11/17/15  Yes [name], MD  Multiple Vitamins-Minerals (CENTRUM SILVER 50+MEN) TABS Take 1 tablet by mouth daily.   Yes Historical Provider, MD  omeprazole (PRILOSEC) 20 MG capsule Take 1 capsule (20 mg total) by mouth every morning. 11/17/15  Yes [name], MD  oxyCODONE-acetaminophen (PERCOCET/ROXICET) 5-325 MG tablet Take 1 tablet by mouth every 4 (four) hours as needed for severe pain. 02/08/16  Yes Gery Pray, MD  polyethylene glycol [facility] / GLYCOLAX) packet Take 17 g by mouth daily as needed (constipation). Reported on 06/21/2015   Yes Historical Provider, MD  prochlorperazine (COMPAZINE) 10 MG tablet Take 1 tablet (10 mg total) by mouth every 6 (six) hours as needed for nausea or vomiting. 06/04/15  Yes Curt Bears, MD  sildenafil (VIAGRA) 100 MG tablet Take 1 tablet (100 mg total) by mouth as directed. Patient taking differently: Take 100 mg by mouth as needed for erectile dysfunction.  11/17/15  Yes [name], MD  simvastatin (ZOCOR) 20 MG tablet Take 1 tablet (20 mg total) by mouth at bedtime. 11/17/15  Yes [name], MD  sucralfate (CARAFATE) 1 g tablet Take 1 tablet (1 g total) by mouth 4 (four) times daily -  with meals and at bedtime. Mix in 2Tbs water, swish and swallow 08/01/15  Yes Hayden Pedro, PA-C  tamsulosin (FLOMAX) 0.4 MG CAPS capsule Take 2 capsules (0.8 mg total) by mouth at bedtime. 11/17/15  Yes [name], MD  traMADol (ULTRAM) 50 MG tablet Take 1 tablet (50 mg total) by mouth  every 6 (six) hours as needed for moderate pain. 12/23/15  Yes Curt Bears, MD     Allergies:     Allergies  Allergen Reactions  . Codeine Other (See Comments)    : hallucinations,faint,dizziness     Physical Exam:   Vitals  Blood pressure 107/68, pulse 97, temperature 98 F (36.7 C), temperature source Oral, resp. rate 18, SpO2 (!) 89 %.   1. General Middle-aged African-American male sitting in hospital bed in mild shortness of breath,  2. Normal affect and insight, Not Suicidal or Homicidal, Awake Alert, Oriented X 3.  3. No F.N deficits, ALL C.Nerves Intact, Strength 5/5 all 4 extremities, Sensation intact all 4 extremities, Plantars down going.  4. Ears and Eyes appear Normal, Conjunctivae clear, PERRLA. Moist Oral Mucosa.  5. Supple Neck, No JVD, No cervical lymphadenopathy appriciated, No Carotid Bruits.  6. Symmetrical Chest wall movement, good air movement on the left side reduced breath sounds on the right base  7. RRR, No Gallops, Rubs or Murmurs, No Parasternal Heave.  8. Positive Bowel Sounds, Abdomen Soft, No tenderness, No organomegaly appriciated,No rebound -guarding or rigidity.  9.  No Cyanosis, Normal Skin Turgor, No Skin Rash or Bruise.  10. Good muscle  tone,  joints appear normal , no effusions, Normal ROM.  11. No Palpable Lymph Nodes in Neck or Axillae      Data Review:    CBC  Recent Labs Lab 02/08/16 1035 02/13/16 0952  WBC 5.2 4.6  HGB 12.6* 10.5*  HCT 38.0* 31.2*  PLT 441* 431*  MCV 89.9 87.9  MCH 29.7 29.6  MCHC 33.1 33.7  RDW 14.3 13.4  LYMPHSABS 0.6* 0.7  MONOABS 0.5 0.5  EOSABS 0.0 0.0  BASOSABS 0.0 0.0   ------------------------------------------------------------------------------------------------------------------  Chemistries   Recent Labs Lab 02/08/16 1035 02/13/16 0952  NA 139 134*  K 4.0 4.1  CL  --  103  CO2 23 25  GLUCOSE 96 94  BUN 11.0 11  CREATININE 0.9 0.93  CALCIUM 10.1 8.7*  AST 13  --     ALT 12  --   ALKPHOS 84  --   BILITOT 0.38  --    ------------------------------------------------------------------------------------------------------------------ estimated creatinine clearance is 61.9 mL/min (by C-G formula based on SCr of 0.93 mg/dL). ------------------------------------------------------------------------------------------------------------------ No results for input(s): TSH, T4TOTAL, T3FREE, THYROIDAB in the last 72 hours.  Invalid input(s): FREET3  Coagulation profile No results for input(s): INR, PROTIME in the last 168 hours. ------------------------------------------------------------------------------------------------------------------- No results for input(s): DDIMER in the last 72 hours. -------------------------------------------------------------------------------------------------------------------  Cardiac Enzymes  Recent Labs Lab 02/13/16 0952  TROPONINI <0.03   ------------------------------------------------------------------------------------------------------------------    Component Value Date/Time   BNP 71.9 02/13/2016 0952     ---------------------------------------------------------------------------------------------------------------  Urinalysis    Component Value Date/Time   COLORURINE AMBER (A) 01/24/2013 1756   APPEARANCEUR CLEAR 01/24/2013 1756   LABSPEC 1.027 01/24/2013 1756   PHURINE 6.0 01/24/2013 1756   GLUCOSEU NEGATIVE 01/24/2013 1756   HGBUR NEGATIVE 01/24/2013 1756   BILIRUBINUR NEGATIVE 01/24/2013 1756   KETONESUR NEGATIVE 01/24/2013 1756   PROTEINUR NEGATIVE 01/24/2013 1756   UROBILINOGEN 0.2 01/24/2013 1756   NITRITE NEGATIVE 01/24/2013 1756   LEUKOCYTESUR NEGATIVE 01/24/2013 1756    ----------------------------------------------------------------------------------------------------------------   Imaging Results:    Dg Chest 2 View  Result Date: 02/13/2016 CLINICAL DATA:  75 year old male with a  history of increasing cough and shortness of breath over 3 days. History of lung cancer EXAM: CHEST  2 VIEW COMPARISON:  PET-CT 12/19/2015, chest CT 12/01/2015, prior chest x-ray 05/31/2015 FINDINGS: Right-sided mediastinal and cardiac border obscured by overlying lung/ pleural disease. Opacity in the right suprahilar region involving the mediastinal border and the right upper lobe. Uplifting of the minor fissure. Dense opacity at the right base obscures the right hemidiaphragm and the right heart border. No pneumothorax. Dense opacity on the lateral view at the posterior base. Trace opacity at the posterior left base. IMPRESSION: Dense opacity at the right base, likely a combination of volume loss, atelectasis/consolidation, and pleural effusion in this patient with known carcinoma. Opacity in the right suprahilar region, potentially a combination of residual disease and/or treatment effects. Aortic atherosclerosis. Signed, Dulcy Fanny. Earleen Newport, DO [facility] Radiology Electronically Signed   By: Corrie Mckusick D.O.   On: 02/13/2016 07:17   Ct Angio Chest Pe W And/or Wo Contrast  Result Date: 02/13/2016 CLINICAL DATA:  75 year old male with a history of cough for 2 weeks EXAM: CT ANGIOGRAPHY CHEST WITH CONTRAST TECHNIQUE: Multidetector CT imaging of the chest was performed using the standard protocol during bolus administration of intravenous contrast. Multiplanar CT image reconstructions and MIPs were obtained to evaluate the vascular anatomy. CONTRAST:  100 cc Isovue 370 COMPARISON:  PET-CT  12/19/2015, CT 12/01/2015 FINDINGS: Chest: Asymmetric breast tissue with left breast tissue measuring 4.7 cm in greatest diameter. This was present on the comparison PET-CT. Bilateral axillary lymph nodes. None of these are enlarged by CT size criteria. No significant supraclavicular adenopathy. Mediastinal structures are shifted from right to left. Central airways are patent to  the carina. Bronchus intermedius patent. Proximal bronchi of the right lower lobe are partially occluded. Left-sided airways are clear. Lungs: Right:  Interval development of large right-sided pleural effusion. Lateral segment of the middle lobe demonstrates complete collapse. 50% atelectasis of the right lower lobe. Dense airspace opacity/nodular consolidation of the right upper lobe. Hypodense focus in the region of the treated tumor measures 2.8 cm x 2.4 cm. There are a few adjacent small nodules which were present on the comparison PET-CT. Left: Ground-glass opacity of the left apex more pronounced than the comparison PET-CT now measuring 11 mm. Pleural based nodule of the left upper lobe anteriorly measures 8 mm, increased from the prior. New left lower lobe nodules (image 72), (image 75) measuring 5 mm and 4 mm. Trace left-sided pleural effusion. Vascular: Unremarkable course caliber and contour of the thoracic aorta with no aneurysm or dissection flap. Scattered calcifications. Calcifications of the branch vessels. No central, lobar, segmental pulmonary artery filling defects. Distal to the segmental vessels, study limited by the motion artifact and contrast bolus. Heart size unchanged.  Trace pericardial fluid/thickening. Musculoskeletal: Small lucent lesion of the T5 right transverse process, new from prior. Sclerotic lesion of the T10 vertebral body, similar to prior. Upper abdomen: Unremarkable appearance of the visualized upper abdomen. Review of the MIP images confirms the above findings. IMPRESSION: New large right-sided pleural effusion contributing to right to left midline shift. There is associated near complete collapse of the right lower lobe, with minimal aeration of the right upper lobe, right middle lobe, and right lower lobe. New nodular soft tissue/consolidation of the right upper lobe in the region of the previous identified right upper lobe tumor, status post initiation of treatment. Given  the time interval from the comparison CT of 81/15/7262, is uncertain to what degree these changes represent treatment treatment effects versus progression. At least some of these are favored to represent progression given the appearance of the contralateral lung and evidence of skeletal metastases. Interval enlargement of left lung nodules and development of new nodules compatible with pulmonary progression of metastatic disease. New lucent lesion of the right T5 transverse process. Re- demonstration of T10 vertebral body sclerotic lesion. Asymmetric soft tissue of the left breast, presumably gynecomastia. Trace pericardial effusion. Small left-sided pleural effusion. Aortic atherosclerosis. Signed, Dulcy Fanny. Earleen Newport, DO [facility] Radiology Electronically Signed   By: Corrie Mckusick D.O.   On: 02/13/2016 08:12    My personal review of EKG: Rhythm sinus tachycardia no Acute ST changes   Assessment & Plan:     1. Acute hypoxic respiratory failure requiring oxygen in a patient with right upper lobe metastatic stage IV adenocarcinoma of the lung now large pleural effusion. This likely is malignant, IR has been consulted for diagnostic and therapeutic ultrasound-guided thoracentesis, appropriate studies have been ordered, for now supportive care with oxygen and nebulizer treatment, expect improvement of symptoms after fluid removal. If better discharge in the morning with outpatient follow-up with primary oncologist Dr. [name].  2. Stage IV right upper lobe metastatic adenocarcinoma of the lung with metastasis to his right pelvis. Supportive care, follow-up with Dr. [name] and his radiation oncologist, he  is currently undergoing chemotherapy and radiation for the metastatic lesion is supposed to be resuming soon.  3. Essential hypertension. Continue home medications.  4. Hypothyroidism. Continue home dose Synthroid.  5. BPH. Continue alpha blocker.  6.  Anemia of chronic disease. No acute issues. Continue monitoring.  7. GERD. Placed on PPI.  8. IBS. Continue Linzess.    DVT Prophylaxis Heparin    AM Labs Ordered, also please review Full Orders  Family Communication: Admission, patients condition and plan of care including tests being ordered have been discussed with the patient and  who indicates understanding and agree with the plan and Code Status.  Code Status DNR  Likely DC to  Home 1-2 days  Condition GUARDED    Consults called: IR    Admission status: Inpt    Time spent in minutes : 35   SINGH,PRASHANT K M.D on 02/13/2016 at 12:02 PM  Between 7am to 7pm - Pager - [phone]. After 7pm go to [email or web address] - password [facility]  [facility] - Office  [phone]

## 2016-02-13 NOTE — ED Triage Notes (Signed)
Pt c/o shob, cough x 2 weeks, last night cough is continuous with new CP. Pt is Lung CA patient with chemo last week.

## 2016-02-13 NOTE — Progress Notes (Signed)
  Radiation Oncology         [phone] ________________________________  Name: Alan [name] MRN: [id number]  Date: 02/09/2016  DOB: Jan 09, 1941  SIMULATION AND TREATMENT PLANNING NOTE    ICD-9-CM ICD-10-CM   1. Bone metastasis (HCC) 198.5 C79.51     DIAGNOSIS:  Metastatic non-small cell lung cancer  NARRATIVE:  The patient was brought to the [facility].  Identity was confirmed.  All relevant records and images related to the planned course of therapy were reviewed.  The patient freely provided informed written consent to proceed with treatment after reviewing the details related to the planned course of therapy. The consent form was witnessed and verified by the simulation staff.  Then, the patient was set-up in a stable reproducible  supine position for radiation therapy.  CT images were obtained.  Surface markings were placed.  The CT images were loaded into the planning software.  Then the target and avoidance structures were contoured.  Treatment planning then occurred.  The radiation prescription was entered and confirmed.  Then, I designed and supervised the construction of a total of 5 medically necessary complex treatment devices.  I have requested :DVH of bladder, rectum, GTV, PTV .  I have ordered:dose  Calc.  PLAN:  The patient will receive 30 Gy in 10 fractions.  -----------------------------------  Blair Promise, PhD, MD

## 2016-02-13 NOTE — ED Notes (Signed)
Pt is currently in Ultrasound

## 2016-02-13 NOTE — Procedures (Signed)
Ultrasound-guided diagnostic and therapeutic right thoracentesis performed yielding 1.7 liters of slightly hazy, yellow  fluid. No immediate complications. Follow-up chest x-ray pending. The fluid was sent to the lab for preordered studies. Due to this being pt's initial thoracentesis only the above amount of fluid was removed today.

## 2016-02-14 DIAGNOSIS — C61 Malignant neoplasm of prostate: Secondary | ICD-10-CM

## 2016-02-14 DIAGNOSIS — C3411 Malignant neoplasm of upper lobe, right bronchus or lung: Principal | ICD-10-CM

## 2016-02-14 DIAGNOSIS — K219 Gastro-esophageal reflux disease without esophagitis: Secondary | ICD-10-CM

## 2016-02-14 DIAGNOSIS — J90 Pleural effusion, not elsewhere classified: Secondary | ICD-10-CM

## 2016-02-14 DIAGNOSIS — Z510 Encounter for antineoplastic radiation therapy: Secondary | ICD-10-CM | POA: Diagnosis not present

## 2016-02-14 DIAGNOSIS — J9601 Acute respiratory failure with hypoxia: Secondary | ICD-10-CM

## 2016-02-14 DIAGNOSIS — I10 Essential (primary) hypertension: Secondary | ICD-10-CM

## 2016-02-14 DIAGNOSIS — N401 Enlarged prostate with lower urinary tract symptoms: Secondary | ICD-10-CM

## 2016-02-14 DIAGNOSIS — C7951 Secondary malignant neoplasm of bone: Secondary | ICD-10-CM

## 2016-02-14 LAB — BASIC METABOLIC PANEL
ANION GAP: 5 (ref 5–15)
BUN: 9 mg/dL (ref 6–20)
CALCIUM: 8.3 mg/dL — AB (ref 8.9–10.3)
CHLORIDE: 106 mmol/L (ref 101–111)
CO2: 25 mmol/L (ref 22–32)
CREATININE: 0.75 mg/dL (ref 0.61–1.24)
GFR calc Af Amer: >60 mL/min (ref >60)
GFR calc non Af Amer: >60 mL/min (ref >60)
GLUCOSE: 91 mg/dL (ref 65–99)
Potassium: 3.7 mmol/L (ref 3.5–5.1)
Sodium: 136 mmol/L (ref 135–145)

## 2016-02-14 LAB — CBC
HCT: 29.7 % — ABNORMAL LOW (ref 39.0–52.0)
HEMOGLOBIN: 9.8 g/dL — AB (ref 13.0–17.0)
MCH: 29.1 pg (ref 26.0–34.0)
MCHC: 33 g/dL (ref 30.0–36.0)
MCV: 88.1 fL (ref 78.0–100.0)
Platelets: 413 10*3/uL — ABNORMAL HIGH (ref 150–400)
RBC: 3.37 MIL/uL — AB (ref 4.22–5.81)
RDW: 13.4 % (ref 11.5–15.5)
WBC: 4.1 10*3/uL (ref 4.0–10.5)

## 2016-02-14 LAB — TRIGLYCERIDES, BODY FLUIDS: Triglycerides, Fluid: 39 mg/dL

## 2016-02-14 LAB — AMYLASE, PLEURAL FLUID: AMYLASE, PLEURAL FLUID: 45 U/L

## 2016-02-14 LAB — PH, BODY FLUID: PH, BODY FLUID: 7.5

## 2016-02-14 NOTE — Congregational Nurse Program (Signed)
Congregational Nurse Program Note  Date of Encounter: 12/25/2015  Past Medical History: Past Medical History:  Diagnosis Date  . Anxiety state, unspecified   . Benign hypertrophy of prostate   . Benign neoplasm of colon    colon polyps  . Cancer [facility])    prostate cancer  . Chronic fatigue 12/23/2015  . Constipation, chronic   . Diverticulosis of colon (without mention of hemorrhage)   . Encounter for antineoplastic chemotherapy 09/29/2015  . Encounter for antineoplastic immunotherapy 12/23/2015  . Erectile dysfunction   . GERD (gastroesophageal reflux disease)   . Glaucoma   . Hypercholesteremia   . Hypothyroidism   . Irritable bowel syndrome   . Left hip pain 10/27/2015  . Onychomycosis   . Radiation 06/20/15-08/02/15   right upper lung region 60 gray  . Restless legs syndrome (RLS)   . Sleep apnea    does not use cpap  . Unspecified essential hypertension     Encounter Details:   Client seen at church.  Continues to have difficulty walking, still trying to use cane.  Has help getting up and down steps at church.  Still positive and tries to "keep going".  Distresses him because he can't do all the things he wants to do.  Allowed to ventilate.  Will be available if he needs me.  Follow up weekly

## 2016-02-14 NOTE — Congregational Nurse Program (Signed)
Congregational Nurse Program Note  Date of Encounter: 11/20/2015  Past Medical History: Past Medical History:  Diagnosis Date  . Anxiety state, unspecified   . Benign hypertrophy of prostate   . Benign neoplasm of colon    colon polyps  . Cancer [facility])    prostate cancer  . Chronic fatigue 12/23/2015  . Constipation, chronic   . Diverticulosis of colon (without mention of hemorrhage)   . Encounter for antineoplastic chemotherapy 09/29/2015  . Encounter for antineoplastic immunotherapy 12/23/2015  . Erectile dysfunction   . GERD (gastroesophageal reflux disease)   . Glaucoma   . Hypercholesteremia   . Hypothyroidism   . Irritable bowel syndrome   . Left hip pain 10/27/2015  . Onychomycosis   . Radiation 06/20/15-08/02/15   right upper lung region 60 gray  . Restless legs syndrome (RLS)   . Sleep apnea    does not use cpap  . Unspecified essential hypertension     Encounter Details:   Alan [name] in church today.  Has been using other ministers to preach at times.  He is positive and "at peace".  Continues to have pain and difficulty with eating.  He is a strong spirit and doesn't like to be "cared for".  Allowed to ventilate.  Cautioned him about being around anyone who is ill.  Will followup

## 2016-02-14 NOTE — Congregational Nurse Program (Signed)
Congregational Nurse Program Note  Date of Encounter: 11/30/2015  Past Medical History: Past Medical History:  Diagnosis Date  . Anxiety state, unspecified   . Benign hypertrophy of prostate   . Benign neoplasm of colon    colon polyps  . Cancer [facility])    prostate cancer  . Chronic fatigue 12/23/2015  . Constipation, chronic   . Diverticulosis of colon (without mention of hemorrhage)   . Encounter for antineoplastic chemotherapy 09/29/2015  . Encounter for antineoplastic immunotherapy 12/23/2015  . Erectile dysfunction   . GERD (gastroesophageal reflux disease)   . Glaucoma   . Hypercholesteremia   . Hypothyroidism   . Irritable bowel syndrome   . Left hip pain 10/27/2015  . Onychomycosis   . Radiation 06/20/15-08/02/15   right upper lung region 60 gray  . Restless legs syndrome (RLS)   . Sleep apnea    does not use cpap  . Unspecified essential hypertension     Encounter Details: Client experiencing pain in pelvis and down legs.  Legs weak and sometimes unable to get up.  Being followed by oncologist and radiation.  Taking pain meds for same. Allowed to vent. Will followup

## 2016-02-14 NOTE — Discharge Summary (Signed)
Physician Discharge Summary  Alan [name]:[id number] DOB: [birth year] DOA: 02/13/2016  PCP: [name], MD  Admit date: 02/13/2016 Discharge date: 02/14/2016  Admitted From: Home Discharge disposition: Home   Recommendations for Outpatient Follow-Up:   1. The patient will follow-up with Dr. [name]. Please follow-up pleural fluid culture results.   Discharge Diagnosis:   Principal Problem:    Acute hypoxemic respiratory failure (HCC) Secondary to malignant pleural effusion Active Problems:    Essential hypertension    GERD    BPH (benign prostatic hypertrophy) with urinary obstruction    Prostate cancer (HCC)    Malignant neoplasm of right upper lobe of lung (HCC)    Bone metastasis (HCC)    Pleural effusion    Discharge Condition: Improved.  Diet recommendation: Low sodium, heart healthy.    History of Present Illness:   Alan [name]  is a 75 y.o. 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 [name] undergoing chemotherapy for close to 6 months and now to resume radiation therapy for his bone lesion, essential hypertension, BPH, hypothyroidism, glaucoma, GERD, erectile dysfunction remote history of smoking who comes to the hospital with 2-3 day history of cough and exertional shortness of breath which is gradually getting worse. No fever chills. No chest pain. No other subjective complaints. Symptoms were worse with exertion and better with rest.  In the ER workup suggests of of large right-sided pleural effusion.  Hospital Course by Problem:   Principal Problem:   Acute hypoxemic respiratory failure (HCC) Secondary to malignant pleural effusion Patient underwent a 1.7 L thoracentesis with improvement in symptoms. Was evaluated by Dr. [name] and cleared for discharge with close follow-up.  Active Problems:   Essential hypertension   GERD   BPH (benign  prostatic hypertrophy) with urinary obstruction   Prostate cancer (HCC)   Malignant neoplasm of right upper lobe of lung (HCC)   Bone metastasis (HCC)   Pleural effusion  The remainder of the patient's chronic medical problems were stable and he can resume his preadmission medications at discharge.    Medical Consultants:    Oncology   Discharge Exam:   Vitals:   02/13/16 2049 02/14/16 0602  BP: 113/67 (!) 94/54  Pulse: (!) 101 84  Resp: 17 16  Temp: 98.5 F (36.9 C) 98.2 F (36.8 C)   Vitals:   02/13/16 1203 02/13/16 1237 02/13/16 2049 02/14/16 0602  BP: 108/68 121/69 113/67 (!) 94/54  Pulse:  (!) 108 (!) 101 84  Resp:  '18 17 16  '$ Temp:  97.7 F (36.5 C) 98.5 F (36.9 C) 98.2 F (36.8 C)  TempSrc:  Oral Oral Oral  SpO2:  93% 96% 95%  Weight:  61.7 kg (136 lb 0.4 oz)  62.4 kg (137 lb 8 oz)  Height:  '5\' 6"'$  (1.676 m)      General exam: Appears calm and comfortable.  Respiratory system: Clear to auscultation, Diminished on the right. Respiratory effort normal. Cardiovascular system: S1 & S2 heard, RRR. No JVD,  rubs, gallops or clicks. No murmurs. Gastrointestinal system: Abdomen is nondistended, soft and nontender. No organomegaly or masses felt. Normal bowel sounds heard. Central nervous system: Alert and oriented. No focal neurological deficits. Extremities: No clubbing,  or cyanosis. No edema. Skin: No rashes, lesions or ulcers. Psychiatry: Judgement and insight appear normal. Mood & affect appropriate.    The results of significant diagnostics from this hospitalization (including  imaging, microbiology, ancillary and laboratory) are listed below for reference.     Procedures and Diagnostic Studies:   Dg Chest 1 View  Result Date: 02/13/2016 CLINICAL DATA:  75 year old male status post thoracentesis EXAM: CHEST 1 VIEW COMPARISON:  X-ray same day, CT same day FINDINGS: Cardiomediastinal silhouette unchanged. Improved visualization of the right heart border,  although the right hemidiaphragm and right cardiophrenic angle are obscured. Similar appearance of right suprahilar density. Improved opacity at the right base status post thoracentesis. Uplifting of the minor fissure persist. Calcifications. No pneumothorax. IMPRESSION: Status post right thoracentesis without pneumothorax. Improved right-sided pleural fluid. Signed, Dulcy Fanny. Earleen Newport, DO [facility] Radiology Electronically Signed   By: Corrie Mckusick D.O.   On: 02/13/2016 12:47   Dg Chest 2 View  Result Date: 02/13/2016 CLINICAL DATA:  75 year old male with a history of increasing cough and shortness of breath over 3 days. History of lung cancer EXAM: CHEST  2 VIEW COMPARISON:  PET-CT 12/19/2015, chest CT 12/01/2015, prior chest x-ray 05/31/2015 FINDINGS: Right-sided mediastinal and cardiac border obscured by overlying lung/ pleural disease. Opacity in the right suprahilar region involving the mediastinal border and the right upper lobe. Uplifting of the minor fissure. Dense opacity at the right base obscures the right hemidiaphragm and the right heart border. No pneumothorax. Dense opacity on the lateral view at the posterior base. Trace opacity at the posterior left base. IMPRESSION: Dense opacity at the right base, likely a combination of volume loss, atelectasis/consolidation, and pleural effusion in this patient with known carcinoma. Opacity in the right suprahilar region, potentially a combination of residual disease and/or treatment effects. Aortic atherosclerosis. Signed, Dulcy Fanny. Earleen Newport, DO [facility] Radiology Electronically Signed   By: Corrie Mckusick D.O.   On: 02/13/2016 07:17   Ct Angio Chest Pe W And/or Wo Contrast  Result Date: 02/13/2016 CLINICAL DATA:  75 year old male with a history of cough for 2 weeks EXAM: CT ANGIOGRAPHY CHEST WITH CONTRAST TECHNIQUE: Multidetector CT imaging of the chest  was performed using the standard protocol during bolus administration of intravenous contrast. Multiplanar CT image reconstructions and MIPs were obtained to evaluate the vascular anatomy. CONTRAST:  100 cc Isovue 370 COMPARISON:  PET-CT 12/19/2015, CT 12/01/2015 FINDINGS: Chest: Asymmetric breast tissue with left breast tissue measuring 4.7 cm in greatest diameter. This was present on the comparison PET-CT. Bilateral axillary lymph nodes. None of these are enlarged by CT size criteria. No significant supraclavicular adenopathy. Mediastinal structures are shifted from right to left. Central airways are patent to the carina. Bronchus intermedius patent. Proximal bronchi of the right lower lobe are partially occluded. Left-sided airways are clear. Lungs: Right:  Interval development of large right-sided pleural effusion. Lateral segment of the middle lobe demonstrates complete collapse. 50% atelectasis of the right lower lobe. Dense airspace opacity/nodular consolidation of the right upper lobe. Hypodense focus in the region of the treated tumor measures 2.8 cm x 2.4 cm. There are a few adjacent small nodules which were present on the comparison PET-CT. Left: Ground-glass opacity of the left apex more pronounced than the comparison PET-CT now measuring 11 mm. Pleural based nodule of the left upper lobe anteriorly measures 8 mm, increased from the prior. New left lower lobe nodules (image 72), (image 75) measuring 5 mm and 4 mm. Trace left-sided pleural effusion. Vascular: Unremarkable course caliber and contour of the thoracic aorta with no aneurysm or dissection flap. Scattered calcifications. Calcifications of the branch vessels. No  central, lobar, segmental pulmonary artery filling defects. Distal to the segmental vessels, study limited by the motion artifact and contrast bolus. Heart size unchanged.  Trace pericardial fluid/thickening. Musculoskeletal: Small lucent lesion of the T5 right transverse process, new  from prior. Sclerotic lesion of the T10 vertebral body, similar to prior. Upper abdomen: Unremarkable appearance of the visualized upper abdomen. Review of the MIP images confirms the above findings. IMPRESSION: New large right-sided pleural effusion contributing to right to left midline shift. There is associated near complete collapse of the right lower lobe, with minimal aeration of the right upper lobe, right middle lobe, and right lower lobe. New nodular soft tissue/consolidation of the right upper lobe in the region of the previous identified right upper lobe tumor, status post initiation of treatment. Given the time interval from the comparison CT of 32/99/2426, is uncertain to what degree these changes represent treatment treatment effects versus progression. At least some of these are favored to represent progression given the appearance of the contralateral lung and evidence of skeletal metastases. Interval enlargement of left lung nodules and development of new nodules compatible with pulmonary progression of metastatic disease. New lucent lesion of the right T5 transverse process. Re- demonstration of T10 vertebral body sclerotic lesion. Asymmetric soft tissue of the left breast, presumably gynecomastia. Trace pericardial effusion. Small left-sided pleural effusion. Aortic atherosclerosis. Signed, Dulcy Fanny. Earleen Newport, DO [facility] Radiology Electronically Signed   By: Corrie Mckusick D.O.   On: 02/13/2016 08:12   US Thoracentesis Asp Pleural Space W/img Guide  Result Date: 02/13/2016 INDICATION: Patient with history of metastatic lung cancer, right greater than left pleural effusions. Request made for diagnostic and therapeutic right thoracentesis. EXAM: ULTRASOUND GUIDED DIAGNOSTIC AND THERAPEUTIC RIGHT THORACENTESIS MEDICATIONS: None. COMPLICATIONS: None immediate. PROCEDURE: An ultrasound guided thoracentesis was thoroughly discussed with the patient  and questions answered. The benefits, risks, alternatives and complications were also discussed. The patient understands and wishes to proceed with the procedure. Written consent was obtained. Ultrasound was performed to localize and mark an adequate pocket of fluid in the right chest. The area was then prepped and draped in the normal sterile fashion. 1% Lidocaine was used for local anesthesia. Under ultrasound guidance a Safe-T-Centesis catheter was introduced. Thoracentesis was performed. The catheter was removed and a dressing applied. FINDINGS: A total of approximately 1.7 liters of slightly hazy, yellow fluid was removed. Samples were sent to the laboratory as requested by the clinical team. Due to this being patient's initial thoracentesis only the above amount of fluid was removed at this time. IMPRESSION: Successful ultrasound guided diagnostic and therapeutic right thoracentesis yielding 1.7 liters of pleural fluid. Read by: Rowe Alfredo, PA-C Electronically Signed   By: Lucrezia Europe M.D.   On: 02/13/2016 12:45     Labs:   Basic Metabolic Panel:  Recent Labs Lab 02/08/16 1035 02/13/16 0952 02/14/16 0525  NA 139 134* 136  K 4.0 4.1 3.7  CL  --  103 106  CO2 '23 25 25  '$ GLUCOSE 96 94 91  BUN 11.0 11 9  CREATININE 0.9 0.93 0.75  CALCIUM 10.1 8.7* 8.3*   GFR Estimated Creatinine Clearance: 70.4 mL/min (by C-G formula based on SCr of 0.8 mg/dL). Liver Function Tests:  Recent Labs Lab 02/08/16 1035 02/13/16 0952  AST 13  --   ALT 12  --   ALKPHOS 84  --   BILITOT 0.38  --   PROT 7.4 6.5  ALBUMIN 3.0*  --  No results for input(s): LIPASE, AMYLASE in the last 168 hours. No results for input(s): AMMONIA in the last 168 hours. Coagulation profile No results for input(s): INR, PROTIME in the last 168 hours.  CBC:  Recent Labs Lab 02/08/16 1035 02/13/16 0952 02/14/16 0525  WBC 5.2 4.6 4.1  NEUTROABS 4.1 3.4  --   HGB 12.6* 10.5* 9.8*  HCT 38.0* 31.2* 29.7*  MCV 89.9  87.9 88.1  PLT 441* 431* 413*   Cardiac Enzymes:  Recent Labs Lab 02/13/16 0952  TROPONINI <0.03   BNP: Invalid input(s): POCBNP CBG: No results for input(s): GLUCAP in the last 168 hours. D-Dimer No results for input(s): DDIMER in the last 72 hours. Hgb A1c No results for input(s): HGBA1C in the last 72 hours. Lipid Profile No results for input(s): CHOL, HDL, LDLCALC, TRIG, CHOLHDL, LDLDIRECT in the last 72 hours. Thyroid function studies No results for input(s): TSH, T4TOTAL, T3FREE, THYROIDAB in the last 72 hours.  Invalid input(s): FREET3 Anemia work up No results for input(s): VITAMINB12, FOLATE, FERRITIN, TIBC, IRON, RETICCTPCT in the last 72 hours. Microbiology Recent Results (from the past 240 hour(s))  Body fluid culture     Status: None   Collection Time: 02/13/16 12:12 PM  Result Value Ref Range Status   Specimen Description PLEURAL RIGHT  Final   Special Requests NONE  Final   Gram Stain   Final    CYTOSPIN WBC PRESENT,BOTH PMN AND MONONUCLEAR NO ORGANISMS SEEN Gram Stain Report Called to,Read Back By and Verified With: E PELLETIER RN AT 3716 ON 08.14.17 BY SHUEA    Culture   Final    DUE TO VOLUME OF SPECIMEN RECEIVED, FLUID PLACED IN CULTURE BOTTLES PER POLICY. Performed at [facility]    Report Status 02/13/2016 FINAL  Final  Culture, body fluid-bottle     Status: None (Preliminary result)   Collection Time: 02/13/16 12:12 PM  Result Value Ref Range Status   Specimen Description PLEURAL RIGHT  Final   Special Requests NONE  Final   Culture   Final    NO GROWTH 1 DAY Performed at [facility]    Report Status PENDING  Incomplete     Discharge Instructions:   Discharge Instructions    Call MD for:  difficulty breathing, headache or visual disturbances    Complete by:  As directed   Call MD for:  severe uncontrolled pain    Complete by:  As directed   Diet - low sodium heart healthy    Complete by:  As directed   Increase  activity slowly    Complete by:  As directed       Medication List    TAKE these medications   acetaminophen 500 MG tablet Commonly known as:  TYLENOL Take 1,000 mg by mouth every 4 (four) hours as needed for mild pain, moderate pain, fever or headache.   amLODipine 5 MG tablet Commonly known as:  NORVASC Take 1 tablet (5 mg total) by mouth every morning.   aspirin EC 81 MG tablet Take 81 mg by mouth daily.   CENTRUM SILVER 50+MEN Tabs Take 1 tablet by mouth daily.   chlorpheniramine-HYDROcodone 10-8 MG/5ML Suer Commonly known as:  TUSSIONEX Take 5 mLs by mouth every 12 (twelve) hours as needed for cough.   chlorproMAZINE 25 MG tablet Commonly known as:  THORAZINE Take 1 tablet (25 mg total) by mouth 3 (three) times daily as needed for hiccoughs.   COMBIGAN 0.2-0.5 % ophthalmic solution  Generic drug:  brimonidine-timolol Place 1 drop into both eyes every 12 (twelve) hours.   dexamethasone 4 MG tablet Commonly known as:  DECADRON 4 mg by mouth twice a day the day before, day of and day after the chemotherapy every 3 weeks.   finasteride 5 MG tablet Commonly known as:  [name] TAKE 1 TABLET BY MOUTH EVERY DAY   fluticasone 50 MCG/ACT nasal spray Commonly known as:  FLONASE Place 2 sprays into both nostrils daily as needed for allergies or rhinitis.   folic acid 1 MG tablet Commonly known as:  FOLVITE Take 1 tablet (1 mg total) by mouth daily.   guaiFENesin 600 MG 12 hr tablet Commonly known as:  MUCINEX Take 2 tablets (1,200 mg total) by mouth 2 (two) times daily.   lactulose 10 GM/15ML solution Commonly known as:  CHRONULAC Take 10 g by mouth 2 (two) times daily as needed (constipation).   levothyroxine 100 MCG tablet Commonly known as:  SYNTHROID, LEVOTHROID Take 1 tablet (100 mcg total) by mouth daily.   linaclotide 290 MCG Caps capsule Commonly known as:  LINZESS Take 1 capsule (290 mcg total) by mouth daily.   meloxicam 7.5 MG tablet Commonly known  as:  MOBIC Take 1 tablet (7.5 mg total) by mouth daily.   omeprazole 20 MG capsule Commonly known as:  PRILOSEC Take 1 capsule (20 mg total) by mouth every morning.   oxyCODONE-acetaminophen 5-325 MG tablet Commonly known as:  PERCOCET/ROXICET Take 1 tablet by mouth every 4 (four) hours as needed for severe pain.   polyethylene glycol packet Commonly known as:  MIRALAX / GLYCOLAX Take 17 g by mouth daily as needed (constipation). Reported on 06/21/2015   prochlorperazine 10 MG tablet Commonly known as:  COMPAZINE Take 1 tablet (10 mg total) by mouth every 6 (six) hours as needed for nausea or vomiting.   sildenafil 100 MG tablet Commonly known as:  VIAGRA Take 1 tablet (100 mg total) by mouth as directed. What changed:  when to take this  reasons to take this   simvastatin 20 MG tablet Commonly known as:  ZOCOR Take 1 tablet (20 mg total) by mouth at bedtime.   sucralfate 1 g tablet Commonly known as:  CARAFATE Take 1 tablet (1 g total) by mouth 4 (four) times daily -  with meals and at bedtime. Mix in 2Tbs water, swish and swallow Notes to patient:  Refused morning dose today   tamsulosin 0.4 MG Caps capsule Commonly known as:  FLOMAX Take 2 capsules (0.8 mg total) by mouth at bedtime.   traMADol 50 MG tablet Commonly known as:  ULTRAM Take 1 tablet (50 mg total) by mouth every 6 (six) hours as needed for moderate pain.   VITAMIN D PO Take 500 Units by mouth daily.      Follow-up Information    Eilleen Kempf., MD .   Specialty:  Oncology Why:  At your scheduled appt time Contact information: [place] [address] [phone]            Time coordinating discharge: 25 minutes.  Signed:  RAMA,CHRISTINA  Pager [phone] [facility] 02/14/2016, 7:08 PM

## 2016-02-14 NOTE — Congregational Nurse Program (Signed)
Congregational Nurse Program Note  Date of Encounter: 02/13/2016  Past Medical History: Past Medical History:  Diagnosis Date  . Anxiety state, unspecified   . Benign hypertrophy of prostate   . Benign neoplasm of colon    colon polyps  . Cancer [facility])    prostate cancer  . Chronic fatigue 12/23/2015  . Constipation, chronic   . Diverticulosis of colon (without mention of hemorrhage)   . Encounter for antineoplastic chemotherapy 09/29/2015  . Encounter for antineoplastic immunotherapy 12/23/2015  . Erectile dysfunction   . GERD (gastroesophageal reflux disease)   . Glaucoma   . Hypercholesteremia   . Hypothyroidism   . Irritable bowel syndrome   . Left hip pain 10/27/2015  . Onychomycosis   . Radiation 06/20/15-08/02/15   right upper lung region 60 gray  . Restless legs syndrome (RLS)   . Sleep apnea    does not use cpap  . Unspecified essential hypertension     Encounter Details:     CNP Questionnaire - 02/13/16 0100      Patient Demographics   Is this a new or existing patient? Existing   Patient is considered a/an Not Applicable   Race African-American/Black     Patient Assistance   Location of Patient Assistance [employer]   Patient's financial/insurance status Low Income;Medicare   Uninsured Patient No   Patient referred to apply for the following financial assistance Not Applicable;[facility] insecurities addressed Not Applicable   Transportation assistance No   Assistance securing medications No   Educational health offerings Cancer;Safety;Health literacy;Nutrition;Spiritual care;Chronic disease     Encounter Details   Primary purpose of visit Education/Health Concerns;Safety;Chronic Illness/Condition Visit;Spiritual Care/Support Visit;Post PCP Visit;Navigating the Healthcare System   Was an Emergency Department visit averted? No   Does patient have a medical provider? Yes   Patient referred to Follow up with established PCP   Was a  mental health screening completed? (GAINS tool) No   Does patient have dental issues? No   Does patient have vision issues? Yes   Was a vision referral made? No   Does your patient have an abnormal blood pressure today? No   Since previous encounter, have you referred patient for abnormal blood pressure that resulted in a new diagnosis or medication change? No   Does your patient have an abnormal blood glucose today? No   Since previous encounter, have you referred patient for abnormal blood glucose that resulted in a new diagnosis or medication change? No   Was there a life-saving intervention made? No      Hospital visit with patient and family.  Client's family in waiting room.  Visit with client in er room.  Having pain and shortness of breath.  Pain originating in hip.  Preparing to go to ultrasound.  Fully alert.  Asking about yesterday's church service.  Will be admitting him today.  Has pleural effusion and has been coughing and losing his breath.  Worsened during night.  Hospital plans to drain fluid.  Praying with client and family.  Will followup this afternoon with family.  Visited with family.  Offered any assistance.  Wife tired as she was awake with client most of night.  Will call her in am.

## 2016-02-14 NOTE — Congregational Nurse Program (Signed)
Congregational Nurse Program Note  Date of Encounter: 11/06/2015  Past Medical History: Past Medical History:  Diagnosis Date  . Anxiety state, unspecified   . Benign hypertrophy of prostate   . Benign neoplasm of colon    colon polyps  . Cancer [facility])    prostate cancer  . Chronic fatigue 12/23/2015  . Constipation, chronic   . Diverticulosis of colon (without mention of hemorrhage)   . Encounter for antineoplastic chemotherapy 09/29/2015  . Encounter for antineoplastic immunotherapy 12/23/2015  . Erectile dysfunction   . GERD (gastroesophageal reflux disease)   . Glaucoma   . Hypercholesteremia   . Hypothyroidism   . Irritable bowel syndrome   . Left hip pain 10/27/2015  . Onychomycosis   . Radiation 06/20/15-08/02/15   right upper lung region 60 gray  . Restless legs syndrome (RLS)   . Sleep apnea    does not use cpap  . Unspecified essential hypertension     Encounter Details:  Client in church this morning.  Walking wiith cane, having pain in pelvis.  Moving slowly and dependent on others at times.  Encouraged to share all symptoms with physician.  Will followup as needed

## 2016-02-15 ENCOUNTER — Ambulatory Visit
Admission: RE | Admit: 2016-02-15 | Discharge: 2016-02-15 | Disposition: A | Discharge status: Home / Self Care | Payer: Medicare HMO | Source: Ambulatory Visit | Attending: Radiation Oncology | Admitting: Radiation Oncology

## 2016-02-15 DIAGNOSIS — Z79899 Other long term (current) drug therapy: Secondary | ICD-10-CM | POA: Diagnosis not present

## 2016-02-15 DIAGNOSIS — R05 Cough: Secondary | ICD-10-CM | POA: Diagnosis not present

## 2016-02-15 DIAGNOSIS — C7951 Secondary malignant neoplasm of bone: Secondary | ICD-10-CM | POA: Diagnosis not present

## 2016-02-15 DIAGNOSIS — Z885 Allergy status to narcotic agent status: Secondary | ICD-10-CM | POA: Diagnosis not present

## 2016-02-15 DIAGNOSIS — Z7982 Long term (current) use of aspirin: Secondary | ICD-10-CM | POA: Diagnosis not present

## 2016-02-15 DIAGNOSIS — Z510 Encounter for antineoplastic radiation therapy: Secondary | ICD-10-CM | POA: Diagnosis not present

## 2016-02-15 DIAGNOSIS — C3411 Malignant neoplasm of upper lobe, right bronchus or lung: Secondary | ICD-10-CM | POA: Diagnosis not present

## 2016-02-15 NOTE — Progress Notes (Signed)
  Radiation Oncology         [phone]) [phone] ________________________________  Name: IVON OELKERS MRN: [id number]  Date: 02/15/2016  DOB: 1941/03/09  Simulation Verification Note    ICD-9-CM ICD-10-CM   1. Bone metastasis (HCC) 198.5 C79.51     Status: outpatient  NARRATIVE: The patient was brought to the treatment unit and placed in the planned treatment position. The clinical setup was verified. Then port films were obtained and uploaded to the radiation oncology medical record software.  The treatment beams were carefully compared against the planned radiation fields. The position location and shape of the radiation fields was reviewed. They targeted volume of tissue appears to be appropriately covered by the radiation beams. Organs at risk appear to be excluded as planned.  Based on my personal review, I approved the simulation verification. The patient's treatment will proceed as planned.  -----------------------------------  Blair Promise, PhD, MD

## 2016-02-16 ENCOUNTER — Ambulatory Visit
Admission: RE | Admit: 2016-02-16 | Discharge: 2016-02-16 | Disposition: A | Discharge status: Home / Self Care | Payer: Medicare HMO | Source: Ambulatory Visit | Attending: Radiation Oncology | Admitting: Radiation Oncology

## 2016-02-16 DIAGNOSIS — Z510 Encounter for antineoplastic radiation therapy: Secondary | ICD-10-CM | POA: Diagnosis not present

## 2016-02-17 ENCOUNTER — Ambulatory Visit
Admission: RE | Admit: 2016-02-17 | Discharge: 2016-02-17 | Disposition: A | Discharge status: Home / Self Care | Payer: Medicare HMO | Source: Ambulatory Visit | Attending: Radiation Oncology | Admitting: Radiation Oncology

## 2016-02-17 DIAGNOSIS — Z510 Encounter for antineoplastic radiation therapy: Secondary | ICD-10-CM | POA: Diagnosis not present

## 2016-02-18 DIAGNOSIS — J90 Pleural effusion, not elsewhere classified: Secondary | ICD-10-CM

## 2016-02-18 LAB — CULTURE, BODY FLUID-BOTTLE: CULTURE: NO GROWTH

## 2016-02-18 LAB — CULTURE, BODY FLUID W GRAM STAIN -BOTTLE

## 2016-02-20 ENCOUNTER — Inpatient Hospital Stay (HOSPITAL_COMMUNITY)
Admission: EM | Admit: 2016-02-20 | Discharge: 2016-02-21 | DRG: 180 | Disposition: A | Discharge status: Home / Self Care | Payer: Medicare HMO | Attending: Internal Medicine | Admitting: Internal Medicine

## 2016-02-20 ENCOUNTER — Telehealth: Payer: Self-pay | Admitting: *Deleted

## 2016-02-20 ENCOUNTER — Ambulatory Visit: Payer: Medicare HMO

## 2016-02-20 ENCOUNTER — Encounter (HOSPITAL_COMMUNITY): Payer: Self-pay

## 2016-02-20 ENCOUNTER — Observation Stay (HOSPITAL_COMMUNITY): Payer: Medicare HMO

## 2016-02-20 ENCOUNTER — Emergency Department (HOSPITAL_COMMUNITY): Payer: Medicare HMO

## 2016-02-20 ENCOUNTER — Inpatient Hospital Stay (HOSPITAL_COMMUNITY): Payer: Medicare HMO

## 2016-02-20 DIAGNOSIS — J9601 Acute respiratory failure with hypoxia: Secondary | ICD-10-CM | POA: Diagnosis present

## 2016-02-20 DIAGNOSIS — Z791 Long term (current) use of non-steroidal anti-inflammatories (NSAID): Secondary | ICD-10-CM

## 2016-02-20 DIAGNOSIS — Z800 Family history of malignant neoplasm of digestive organs: Secondary | ICD-10-CM

## 2016-02-20 DIAGNOSIS — R918 Other nonspecific abnormal finding of lung field: Secondary | ICD-10-CM | POA: Diagnosis present

## 2016-02-20 DIAGNOSIS — D72829 Elevated white blood cell count, unspecified: Secondary | ICD-10-CM | POA: Diagnosis present

## 2016-02-20 DIAGNOSIS — K5909 Other constipation: Secondary | ICD-10-CM | POA: Diagnosis present

## 2016-02-20 DIAGNOSIS — G2581 Restless legs syndrome: Secondary | ICD-10-CM | POA: Diagnosis present

## 2016-02-20 DIAGNOSIS — N400 Enlarged prostate without lower urinary tract symptoms: Secondary | ICD-10-CM | POA: Diagnosis present

## 2016-02-20 DIAGNOSIS — Z86010 Personal history of colonic polyps: Secondary | ICD-10-CM | POA: Diagnosis not present

## 2016-02-20 DIAGNOSIS — Z885 Allergy status to narcotic agent status: Secondary | ICD-10-CM | POA: Diagnosis not present

## 2016-02-20 DIAGNOSIS — Z9889 Other specified postprocedural states: Secondary | ICD-10-CM

## 2016-02-20 DIAGNOSIS — C3411 Malignant neoplasm of upper lobe, right bronchus or lung: Secondary | ICD-10-CM | POA: Diagnosis present

## 2016-02-20 DIAGNOSIS — D638 Anemia in other chronic diseases classified elsewhere: Secondary | ICD-10-CM | POA: Diagnosis present

## 2016-02-20 DIAGNOSIS — Z719 Counseling, unspecified: Secondary | ICD-10-CM

## 2016-02-20 DIAGNOSIS — Z7952 Long term (current) use of systemic steroids: Secondary | ICD-10-CM | POA: Diagnosis not present

## 2016-02-20 DIAGNOSIS — Z7982 Long term (current) use of aspirin: Secondary | ICD-10-CM | POA: Diagnosis not present

## 2016-02-20 DIAGNOSIS — Z7951 Long term (current) use of inhaled steroids: Secondary | ICD-10-CM

## 2016-02-20 DIAGNOSIS — R0602 Shortness of breath: Secondary | ICD-10-CM | POA: Diagnosis present

## 2016-02-20 DIAGNOSIS — Z8546 Personal history of malignant neoplasm of prostate: Secondary | ICD-10-CM

## 2016-02-20 DIAGNOSIS — J910 Malignant pleural effusion: Secondary | ICD-10-CM | POA: Diagnosis present

## 2016-02-20 DIAGNOSIS — E7800 Pure hypercholesterolemia, unspecified: Secondary | ICD-10-CM | POA: Diagnosis present

## 2016-02-20 DIAGNOSIS — Z79899 Other long term (current) drug therapy: Secondary | ICD-10-CM

## 2016-02-20 DIAGNOSIS — Z9221 Personal history of antineoplastic chemotherapy: Secondary | ICD-10-CM | POA: Diagnosis not present

## 2016-02-20 DIAGNOSIS — J90 Pleural effusion, not elsewhere classified: Secondary | ICD-10-CM

## 2016-02-20 DIAGNOSIS — E039 Hypothyroidism, unspecified: Secondary | ICD-10-CM | POA: Diagnosis present

## 2016-02-20 DIAGNOSIS — R05 Cough: Secondary | ICD-10-CM | POA: Diagnosis not present

## 2016-02-20 DIAGNOSIS — K219 Gastro-esophageal reflux disease without esophagitis: Secondary | ICD-10-CM | POA: Diagnosis present

## 2016-02-20 DIAGNOSIS — R000 Tachycardia, unspecified: Secondary | ICD-10-CM | POA: Diagnosis present

## 2016-02-20 DIAGNOSIS — C7951 Secondary malignant neoplasm of bone: Secondary | ICD-10-CM | POA: Diagnosis not present

## 2016-02-20 DIAGNOSIS — R0902 Hypoxemia: Secondary | ICD-10-CM

## 2016-02-20 DIAGNOSIS — I10 Essential (primary) hypertension: Secondary | ICD-10-CM | POA: Diagnosis present

## 2016-02-20 DIAGNOSIS — Z510 Encounter for antineoplastic radiation therapy: Secondary | ICD-10-CM | POA: Diagnosis present

## 2016-02-20 LAB — BASIC METABOLIC PANEL
ANION GAP: 8 (ref 5–15)
BUN: 12 mg/dL (ref 6–20)
CHLORIDE: 102 mmol/L (ref 101–111)
CO2: 25 mmol/L (ref 22–32)
Calcium: 9.2 mg/dL (ref 8.9–10.3)
Creatinine, Ser: 0.77 mg/dL (ref 0.61–1.24)
GFR calc non Af Amer: >60 mL/min (ref >60)
Glucose, Bld: 111 mg/dL — ABNORMAL HIGH (ref 65–99)
POTASSIUM: 3.6 mmol/L (ref 3.5–5.1)
SODIUM: 135 mmol/L (ref 135–145)

## 2016-02-20 LAB — CBC
HEMATOCRIT: 34.4 % — AB (ref 39.0–52.0)
Hemoglobin: 11.6 g/dL — ABNORMAL LOW (ref 13.0–17.0)
MCH: 29.4 pg (ref 26.0–34.0)
MCHC: 33.7 g/dL (ref 30.0–36.0)
MCV: 87.1 fL (ref 78.0–100.0)
Platelets: 538 10*3/uL — ABNORMAL HIGH (ref 150–400)
RBC: 3.95 MIL/uL — AB (ref 4.22–5.81)
RDW: 13.5 % (ref 11.5–15.5)
WBC: 4.7 10*3/uL (ref 4.0–10.5)

## 2016-02-20 LAB — BODY FLUID CELL COUNT WITH DIFFERENTIAL
Eos, Fluid: 1 %
LYMPHS FL: 64 %
Monocyte-Macrophage-Serous Fluid: 19 % — ABNORMAL LOW (ref 50–90)
NEUTROPHIL FLUID: 16 % (ref 0–25)
WBC FLUID: 630 uL (ref 0–1000)

## 2016-02-20 LAB — GRAM STAIN

## 2016-02-20 LAB — LACTATE DEHYDROGENASE, PLEURAL OR PERITONEAL FLUID: LD, Fluid: 189 U/L — ABNORMAL HIGH (ref 3–23)

## 2016-02-20 LAB — I-STAT TROPONIN, ED: Troponin i, poc: 0 ng/mL (ref 0.00–0.08)

## 2016-02-20 LAB — PROTEIN, BODY FLUID: Total protein, fluid: 4.1 g/dL

## 2016-02-20 MED ORDER — SODIUM CHLORIDE 0.9% FLUSH: 3.0000 mL | Freq: Two times a day (BID) | INTRAVENOUS | Status: DC

## 2016-02-20 MED ORDER — LINACLOTIDE 290 MCG PO CAPS
290.0000 ug | ORAL_CAPSULE | Freq: Every day | ORAL | Status: DC
  Administered 2016-02-20: 290 ug via ORAL
  Filled 2016-02-20 (×2): qty 1

## 2016-02-20 MED ORDER — SIMVASTATIN 20 MG PO TABS: 20.0000 mg | ORAL_TABLET | Freq: Every day | ORAL | Status: DC

## 2016-02-20 MED ORDER — BRIMONIDINE TARTRATE 0.2 % OP SOLN
1.0000 [drp] | Freq: Two times a day (BID) | OPHTHALMIC | Status: DC
  Administered 2016-02-20 – 2016-02-21 (×2): 1 [drp] via OPHTHALMIC
  Filled 2016-02-20: qty 5

## 2016-02-20 MED ORDER — LEVOTHYROXINE SODIUM 100 MCG PO TABS
100.0000 ug | ORAL_TABLET | Freq: Every day | ORAL | Status: DC
  Administered 2016-02-21: 100 ug via ORAL
  Filled 2016-02-20: qty 1

## 2016-02-20 MED ORDER — SODIUM CHLORIDE 0.9 % IV SOLN
250.0000 mL | INTRAVENOUS | Status: DC | PRN
Start: 2016-02-20 — End: 2016-02-21

## 2016-02-20 MED ORDER — LACTULOSE 10 GM/15ML PO SOLN: 10.0000 g | Freq: Two times a day (BID) | ORAL | Status: DC | PRN

## 2016-02-20 MED ORDER — SODIUM CHLORIDE 0.9% FLUSH
3.0000 mL | Freq: Two times a day (BID) | INTRAVENOUS | Status: DC
  Administered 2016-02-20 – 2016-02-21 (×2): 3 mL via INTRAVENOUS

## 2016-02-20 MED ORDER — FLUTICASONE PROPIONATE 50 MCG/ACT NA SUSP
2.0000 | Freq: Every day | NASAL | Status: DC | PRN
  Filled 2016-02-20: qty 16

## 2016-02-20 MED ORDER — TIMOLOL MALEATE 0.5 % OP SOLN
1.0000 [drp] | Freq: Two times a day (BID) | OPHTHALMIC | Status: DC
  Administered 2016-02-20 – 2016-02-21 (×2): 1 [drp] via OPHTHALMIC
  Filled 2016-02-20: qty 5

## 2016-02-20 MED ORDER — SUCRALFATE 1 GM/10ML PO SUSP
1.0000 g | Freq: Three times a day (TID) | ORAL | Status: DC
  Administered 2016-02-20 – 2016-02-21 (×2): 1 g via ORAL
  Filled 2016-02-20 (×2): qty 10

## 2016-02-20 MED ORDER — BRIMONIDINE TARTRATE-TIMOLOL 0.2-0.5 % OP SOLN: 1.0000 [drp] | Freq: Two times a day (BID) | OPHTHALMIC | Status: DC

## 2016-02-20 MED ORDER — ACETAMINOPHEN 500 MG PO TABS: 1000.0000 mg | ORAL_TABLET | ORAL | Status: DC | PRN

## 2016-02-20 MED ORDER — POLYETHYLENE GLYCOL 3350 17 G PO PACK: 17.0000 g | PACK | Freq: Every day | ORAL | Status: DC | PRN

## 2016-02-20 MED ORDER — FINASTERIDE 5 MG PO TABS
5.0000 mg | ORAL_TABLET | Freq: Every day | ORAL | Status: DC
  Administered 2016-02-21: 5 mg via ORAL
  Filled 2016-02-20: qty 1

## 2016-02-20 MED ORDER — HEPARIN SODIUM (PORCINE) 5000 UNIT/ML IJ SOLN
5000.0000 [IU] | Freq: Three times a day (TID) | INTRAMUSCULAR | Status: DC
  Administered 2016-02-20 – 2016-02-21 (×2): 5000 [IU] via SUBCUTANEOUS
  Filled 2016-02-20 (×2): qty 1

## 2016-02-20 MED ORDER — OXYCODONE-ACETAMINOPHEN 5-325 MG PO TABS
2.0000 | ORAL_TABLET | ORAL | Status: DC | PRN
  Administered 2016-02-20 – 2016-02-21 (×3): 2 via ORAL
  Filled 2016-02-20 (×3): qty 2

## 2016-02-20 MED ORDER — TRAMADOL HCL 50 MG PO TABS: 50.0000 mg | ORAL_TABLET | Freq: Four times a day (QID) | ORAL | Status: DC | PRN

## 2016-02-20 MED ORDER — ONDANSETRON HCL 4 MG PO TABS: 4.0000 mg | ORAL_TABLET | Freq: Four times a day (QID) | ORAL | Status: DC | PRN

## 2016-02-20 MED ORDER — OXYCODONE-ACETAMINOPHEN 5-325 MG PO TABS
1.0000 | ORAL_TABLET | ORAL | Status: DC | PRN
  Administered 2016-02-21: 1 via ORAL
  Filled 2016-02-20 (×2): qty 1

## 2016-02-20 MED ORDER — FOLIC ACID 1 MG PO TABS
1.0000 mg | ORAL_TABLET | Freq: Every day | ORAL | Status: DC
  Administered 2016-02-21: 1 mg via ORAL
  Filled 2016-02-20: qty 1

## 2016-02-20 MED ORDER — HYDROCOD POLST-CPM POLST ER 10-8 MG/5ML PO SUER
5.0000 mL | Freq: Two times a day (BID) | ORAL | Status: DC | PRN
  Administered 2016-02-20 – 2016-02-21 (×2): 5 mL via ORAL
  Filled 2016-02-20 (×2): qty 5

## 2016-02-20 MED ORDER — CHLORPROMAZINE HCL 25 MG PO TABS
25.0000 mg | ORAL_TABLET | Freq: Three times a day (TID) | ORAL | Status: DC | PRN
  Administered 2016-02-20: 25 mg via ORAL
  Filled 2016-02-20 (×3): qty 1

## 2016-02-20 MED ORDER — PANTOPRAZOLE SODIUM 40 MG PO TBEC
40.0000 mg | DELAYED_RELEASE_TABLET | Freq: Every day | ORAL | Status: DC
  Administered 2016-02-20 – 2016-02-21 (×2): 40 mg via ORAL
  Filled 2016-02-20 (×2): qty 1

## 2016-02-20 MED ORDER — ONDANSETRON HCL 4 MG/2ML IJ SOLN: 4.0000 mg | Freq: Four times a day (QID) | INTRAMUSCULAR | Status: DC | PRN

## 2016-02-20 MED ORDER — ASPIRIN EC 81 MG PO TBEC
81.0000 mg | DELAYED_RELEASE_TABLET | Freq: Every day | ORAL | Status: DC
  Administered 2016-02-21: 81 mg via ORAL
  Filled 2016-02-20: qty 1

## 2016-02-20 MED ORDER — SODIUM CHLORIDE 0.9% FLUSH
3.0000 mL | INTRAVENOUS | Status: DC | PRN
Start: 2016-02-20 — End: 2016-02-21

## 2016-02-20 MED ORDER — TAMSULOSIN HCL 0.4 MG PO CAPS
0.8000 mg | ORAL_CAPSULE | Freq: Every day | ORAL | Status: DC
  Administered 2016-02-20: 0.8 mg via ORAL
  Filled 2016-02-20: qty 2

## 2016-02-20 NOTE — Telephone Encounter (Signed)
Patient daughter called cancelling todays radiation treatment, he is in the ED now difficulty breathing, thanked daughter and informed MD, and called [facility],spoke with Ana, 10:02 AM

## 2016-02-20 NOTE — ED Notes (Signed)
EDP has asked for EKG to be repeated once patient is in room and can lay back in stretcher.

## 2016-02-20 NOTE — ED Notes (Signed)
Place pt on 1 lpm Justice for comfort per Little MD.

## 2016-02-20 NOTE — ED Notes (Signed)
Pt not in room.

## 2016-02-20 NOTE — ED Notes (Signed)
13:20 pt can go up to floor.

## 2016-02-20 NOTE — ED Provider Notes (Signed)
[place] DEPT Provider Note   CSN: [id number] Arrival date & time: 02/20/16  1700     History   Chief Complaint Chief Complaint  Patient presents with  . Shortness of Breath  . Chest Pain    HPI TYKEE HEIDEMAN is a 75 y.o. male.  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 The patient was admitted one week ago for chest pain and shortness of breath related to a large right pleural effusion. He had 1.7 L drained and reports feeling much better at time of discharge. Since discharge, he has had a gradual reoccurrence of his symptoms of chest pain and shortness of breath. The chest pain is right-sided and constant, the exact same pain that he had previously. The shortness of breath is worse with minimal exertion and any kind of movement. No lower extremity edema, fever, or abdominal pain. He has had a persistent dry cough which he had at the previous presentation.   The history is provided by the patient.  Shortness of Breath  Associated symptoms include chest pain.  Chest Pain   Associated symptoms include shortness of breath.    Past Medical History:  Diagnosis Date  . Anxiety state, unspecified   . Benign hypertrophy of prostate   . Benign neoplasm of colon    colon polyps  . Cancer [facility])    prostate cancer  . Chronic fatigue 12/23/2015  . Constipation, chronic   . Diverticulosis of colon (without mention of hemorrhage)   . Encounter for antineoplastic chemotherapy 09/29/2015  . Encounter for antineoplastic immunotherapy 12/23/2015  . Erectile dysfunction   . GERD (gastroesophageal reflux disease)   . Glaucoma   . Hypercholesteremia   . Hypothyroidism   . Irritable bowel syndrome   . Left hip pain 10/27/2015  . Onychomycosis   . Radiation 06/20/15-08/02/15   right upper lung region 60 gray  . Restless legs syndrome (RLS)   . Sleep apnea    does not use cpap  . Unspecified essential  hypertension     Patient Active Problem List   Diagnosis Date Noted  . Acute hypoxemic respiratory failure ([place]) 02/13/2016  . Pleural effusion 02/13/2016  . 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 Chronic fatigue 12/23/2015  . Encounter for antineoplastic immunotherapy 12/23/2015  . Left hip pain 10/27/2015  . Encounter for antineoplastic chemotherapy 09/29/2015  . Malignant neoplasm of right upper lobe of lung ([place]) 06/03/2015  . Lung mass 06/01/2015  . Constipation 04/21/2015  . 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 Gynecomastia, male 10/20/2014  . Decreased hearing 10/20/2014  . Prostate cancer ([place]) 10/20/2014  . Acute appendicitis with peritoneal abscess 11/13/2012  . CHEST WALL PAIN, ANTERIOR 05/29/2010  . OTHER CONSTIPATION 07/12/2009  . ONYCHOMYCOSIS 10/15/2008  . Anxiety state 10/15/2008  . 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 OBSTRUCTIVE SLEEP APNEA 09/21/2007  . RESTLESS LEG SYNDROME 09/21/2007  . Diverticulosis of large intestine 09/21/2007  . HYPERCHOLESTEROLEMIA 09/11/2007  . ERECTILE DYSFUNCTION 09/11/2007  . Essential hypertension 09/11/2007  . GERD 09/11/2007  . IRRITABLE BOWEL SYNDROME 09/11/2007  . BPH (benign prostatic hypertrophy) with urinary obstruction 09/11/2007  . COLONIC POLYPS 10/30/2005    Past Surgical History:  Procedure Laterality Date  . APPENDECTOMY  12/2012  . CARDIAC CATHETERIZATION  ? 2008  . COLONOSCOPY    . LAPAROSCOPIC APPENDECTOMY N/A 01/27/2013   Procedure: APPENDECTOMY LAPAROSCOPIC;  Surgeon: Edward Jolly, MD;  Location: [facility] ORS;  Service: General;  Laterality: N/A;  . MEDIASTINOSCOPY N/A 05/31/2015   Procedure: MEDIASTINOSCOPY;  Surgeon: Gaye Pollack, MD;  Location: [facility] OR;  Service: Thoracic;  Laterality: N/A;  . none    . THYROIDECTOMY    . TONSILLECTOMY    . VIDEO BRONCHOSCOPY N/A 05/31/2015   Procedure: VIDEO BRONCHOSCOPY;  Surgeon: Gaye Pollack, MD;  Location: [facility] OR;  Service: Thoracic;  Laterality: N/A;       Home  Medications    Prior to Admission medications   Medication Sig Start Date End Date Taking? Authorizing Provider  acetaminophen (TYLENOL) 500 MG tablet Take 1,000 mg by mouth every 4 (four) hours as needed for mild pain, moderate pain, fever or headache.    Yes Historical Provider, MD  amLODipine (NORVASC) 5 MG tablet Take 1 tablet (5 mg total) by mouth every morning. 11/17/15  Yes [name], MD  aspirin EC 81 MG tablet Take 81 mg by mouth daily.    Yes Historical Provider, MD  brimonidine-timolol (COMBIGAN) 0.2-0.5 % ophthalmic solution Place 1 drop into both eyes every 12 (twelve) hours.   Yes Historical Provider, MD  chlorpheniramine-HYDROcodone (TUSSIONEX) 10-8 MG/5ML SUER Take 5 mLs by mouth every 12 (twelve) hours as needed for cough. 02/08/16  Yes Gery Pray, MD  dexamethasone (DECADRON) 4 MG tablet 4 mg by mouth twice a day the day before, day of and day after the chemotherapy every 3 weeks. 09/29/15  Yes Curt Bears, MD  finasteride ([name]) 5 MG tablet TAKE 1 TABLET BY MOUTH EVERY DAY 12/16/15  Yes [name], MD  fluticasone [facility]) 50 MCG/ACT nasal spray Place 2 sprays into both nostrils daily as needed for allergies or rhinitis. 11/17/15  Yes [name], MD  folic acid (FOLVITE) 1 MG tablet Take 1 tablet (1 mg total) by mouth daily. 09/29/15  Yes Curt Bears, MD  guaiFENesin (MUCINEX) 600 MG 12 hr tablet Take 2 tablets (1,200 mg total) by mouth 2 (two) times daily. Patient taking differently: Take 1,200 mg by mouth 2 (two) times daily as needed for cough.  02/08/16  Yes Gery Pray, MD  lactulose (CHRONULAC) 10 GM/15ML solution Take 10 g by mouth 2 (two) times daily as needed (constipation).    Yes Historical Provider, MD  levothyroxine (SYNTHROID, LEVOTHROID) 100 MCG tablet Take 1 tablet (100 mcg total) by mouth daily. 11/02/15  Yes Philemon Kingdom, MD  Linaclotide Rolan Lipa) 290 MCG CAPS capsule Take 1 capsule (290 mcg total) by mouth daily. 06/07/15  Yes Mauri Pole, MD  meloxicam (MOBIC) 7.5 MG tablet Take 1 tablet (7.5 mg total) by mouth daily. 11/17/15  Yes [name], MD  Multiple Vitamins-Minerals (CENTRUM SILVER 50+MEN) TABS Take 1 tablet by mouth daily.   Yes Historical Provider, MD  omeprazole (PRILOSEC) 20 MG capsule Take 1 capsule (20 mg total) by mouth every morning. 11/17/15  Yes [name], MD  oxyCODONE-acetaminophen (PERCOCET/ROXICET) 5-325 MG tablet Take 1 tablet by mouth every 4 (four) hours as needed for severe pain. 02/08/16  Yes Gery Pray, MD  polyethylene glycol [facility] / GLYCOLAX) packet Take 17 g by mouth daily as needed (constipation). Reported on 06/21/2015   Yes Historical Provider, MD  prochlorperazine (COMPAZINE) 10 MG tablet Take 1 tablet (10 mg total) by mouth every 6 (six) hours as needed for nausea or vomiting. 06/04/15  Yes Curt Bears, MD  sildenafil (VIAGRA) 100 MG tablet Take 1 tablet (100 mg total) by mouth as directed. Patient taking differently: Take 100 mg  by mouth as needed for erectile dysfunction.  11/17/15  Yes [name], MD  simvastatin (ZOCOR) 20 MG tablet Take 1 tablet (20 mg total) by mouth at bedtime. 11/17/15  Yes [name], MD  sucralfate (CARAFATE) 1 g tablet Take 1 tablet (1 g total) by mouth 4 (four) times daily -  with meals and at bedtime. Mix in 2Tbs water, swish and swallow 08/01/15  Yes Hayden Pedro, PA-C  tamsulosin (FLOMAX) 0.4 MG CAPS capsule Take 2 capsules (0.8 mg total) by mouth at bedtime. 11/17/15  Yes [name], MD  traMADol (ULTRAM) 50 MG tablet Take 1 tablet (50 mg total) by mouth every 6 (six) hours as needed for moderate pain. 12/23/15  Yes Curt Bears, MD  chlorproMAZINE (THORAZINE) 25 MG tablet Take 1 tablet (25 mg total) by mouth 3 (three) times daily as needed for hiccoughs. 11/24/15   Curt Bears, MD  Cholecalciferol (VITAMIN D PO) Take 500 Units by mouth daily.    Historical Provider, MD    Family History Family History  Problem Relation Age  of Onset  . Cancer Father     prostate  . Colon cancer Paternal Grandfather 43  . Cancer Paternal Grandfather     prostate  . Cancer Brother     prostate    Social History Social History  Substance Use Topics  . Smoking status: Former Smoker    Packs/day: 2.00    Years: 15.00    Types: Cigarettes    Quit date: 07/02/1968  . Smokeless tobacco: Former Systems developer    Types: Chew, Snuff  . Alcohol use No     Allergies   Codeine   Review of Systems Review of Systems  Respiratory: Positive for shortness of breath.   Cardiovascular: Positive for chest pain.   10 Systems reviewed and are negative for acute change except as noted in the HPI.   Physical Exam Updated Vital Signs BP 124/75 (BP Location: Left Arm)   Pulse 111   Temp 98.5 F (36.9 C) (Oral)   Resp 22   SpO2 94%   Physical Exam  Constitutional: He is oriented to person, place, and time. He appears well-developed. No distress.  Thin, awake and alert  HENT:  Head: Normocephalic and atraumatic.  Moist mucous membranes  Eyes: Conjunctivae are normal.  Neck: Neck supple.  Cardiovascular: Regular rhythm and normal heart sounds.  Tachycardia present.   No murmur heard. Pulmonary/Chest:  Mild tachypnea, diminished BS R lung w/ almost absent BS in base  Abdominal: Soft. Bowel sounds are normal. He exhibits no distension. There is no tenderness.  Musculoskeletal: He exhibits no edema.  Neurological: He is alert and oriented to person, place, and time.  Fluent speech  Skin: Skin is warm and dry.  Psychiatric: He has a normal mood and affect. Judgment normal.  pleasant  Nursing note and vitals reviewed.    ED Treatments / Results  Labs (all labs ordered are listed, but only abnormal results are displayed) Labs Reviewed  BASIC METABOLIC PANEL - Abnormal; Notable for the following:       Result Value   Glucose, Bld 111 (*)    All other components within normal limits  CBC - Abnormal; Notable for the following:      RBC 3.95 (*)    Hemoglobin 11.6 (*)    HCT 34.4 (*)    Platelets 538 (*)    All other components within normal limits  Randolm Idol, ED  EKG  EKG Interpretation  Date/Time:  Monday February 20 2016 10:38:23 EDT Ventricular Rate:  118 PR Interval:    QRS Duration: 87 QT Interval:  347 QTC Calculation: 487 R Axis:   95 Text Interpretation:  Sinus tachycardia Right axis deviation Borderline T wave abnormalities Borderline prolonged QT interval aside from tachycardia, no significant change from previous EKG Confirmed by Reannah Totten MD, Savon Cobbs [phone]) on 02/20/2016 10:56:33 AM       Radiology Dg Chest 2 View  Result Date: 02/20/2016 CLINICAL DATA:  Worsening shortness of breath over the past 4 days and patient with a history of lung carcinoma. EXAM: CHEST  2 VIEW COMPARISON:  PA and lateral chest, single view of the chest and CT chest 02/13/2016. FINDINGS: Moderate to moderately large right pleural effusion appears slightly increased in size since the most recent plain film. Opacity in the right hilar and suprahilar region consistent with history of lung carcinoma and post treatment change is stable in appearance. Trace left pleural effusion is seen. Heart size is normal. Aortic atherosclerosis is identified. No pneumothorax. IMPRESSION: Some increase in a moderate to moderately large right pleural effusion since the most recent examination in patient with known lung carcinoma on the right. No change in a trace left pleural effusion. Atherosclerosis. Electronically Signed   By: Inge Rise M.D.   On: 02/20/2016 10:29    Procedures Procedures (including critical care time)  Medications Ordered in ED Medications  oxyCODONE-acetaminophen (PERCOCET/ROXICET) 5-325 MG per tablet 2 tablet (2 tablets Oral Given 02/20/16 1110)     Initial Impression / Assessment and Plan / ED Course  I have reviewed the triage vital signs and the nursing notes.  Pertinent labs & imaging results that  were available during my care of the patient were reviewed by me and considered in my medical decision making (see chart for details).  Clinical Course   Patient with metastatic cancer of the lung and known right pleural effusion presents with recurrence of chest pain and shortness of breath shortly after having therapeutic thoracentesis last week. He was awake and alert, tachypneic but in no respiratory distress. He was tachycardic in the 110s, O2 92-95% on RA. Almost absent breath sounds in right lower lung. EKG shows no acute ischemic changes and troponin negative. Blood counts are stable. His chest x-ray shows worsening right pleural effusion and I suspect that this is the cause of his symptoms as he states the symptoms are exactly the same as with his previous hospitalization. He had a CTA of the chest at the previous hospitalization which was negative for PE therefore I do not feel he needs any further imaging currently. I have discussed admission with hospitalist Dr. Aileen Fass and have ordered IR thoracentesis. Patient will be admitted for further treatment.  Final Clinical Impressions(s) / ED Diagnoses   Final diagnoses:  Shortness of breath  Pleural effusion, right  Hypoxia  Tachycardia    New Prescriptions New Prescriptions   No medications on file     Sharlett Iles, MD 02/20/16 1122

## 2016-02-20 NOTE — Procedures (Signed)
Ultrasound-guided diagnostic and therapeutic right thoracentesis performed yielding 2.4 liters of yellow colored fluid. No immediate complications. Follow-up chest x-ray pending.  Malakai Schoenherr E 12:39 PM 02/20/2016

## 2016-02-20 NOTE — ED Triage Notes (Signed)
Pt has lung cancer on rt side and going under treatment.  Pt had increased shortness of breath with chest pain since Friday.  Coughing.  No fever.  Has appt today at cancer center. Evaluated last week for same and told he had some fluid on his lungs.

## 2016-02-20 NOTE — H&P (Signed)
History and Physical    Alan [name] LOV:[id number] DOB: [birth year] DOA: 02/20/2016  PCP: [name], MD  Patient coming from: home  Chief Complaint: SOB  HPI: Alan [name] is a 75 y.o. 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 undergoing chemotherapy and radiation therapy for the last 6 months (radiation to the bone due to his metastases) essential hypertension, with recent thoracocentesis this morning her malignant effusion that comes in for shortness of breath started 3 days prior to admission progressively getting worse to the point where even walk to the bathroom without being short of breath he also has palpitations, denies chest pain   ED Course:  Resolved hypoxic and tachycardic was placed on oxygen, mild normocytic anemia with leukocytosis  Review of Systems: As per HPI otherwise 10 point review of systems negative.   Past Medical History:  Diagnosis Date  . Anxiety state, unspecified   . Benign hypertrophy of prostate   . Benign neoplasm of colon    colon polyps  . Cancer [facility])    prostate cancer  . Chronic fatigue 12/23/2015  . Constipation, chronic   . Diverticulosis of colon (without mention of hemorrhage)   . Encounter for antineoplastic chemotherapy 09/29/2015  . Encounter for antineoplastic immunotherapy 12/23/2015  . Erectile dysfunction   . GERD (gastroesophageal reflux disease)   . Glaucoma   . Hypercholesteremia   . Hypothyroidism   . Irritable bowel syndrome   . Left hip pain 10/27/2015  . Onychomycosis   . Radiation 06/20/15-08/02/15   right upper lung region 60 gray  . Restless legs syndrome (RLS)   . Sleep apnea    does not use cpap  . Unspecified essential hypertension     Past Surgical History:  Procedure Laterality Date  . APPENDECTOMY  12/2012  . CARDIAC CATHETERIZATION  ? 2008  . COLONOSCOPY    . LAPAROSCOPIC  APPENDECTOMY N/A 01/27/2013   Procedure: APPENDECTOMY LAPAROSCOPIC;  Surgeon: Edward Jolly, MD;  Location: [facility];  Service: General;  Laterality: N/A;  . MEDIASTINOSCOPY N/A 05/31/2015   Procedure: MEDIASTINOSCOPY;  Surgeon: Gaye Pollack, MD;  Location: [facility];  Service: Thoracic;  Laterality: N/A;  . none    . THYROIDECTOMY    . TONSILLECTOMY    . VIDEO BRONCHOSCOPY N/A 05/31/2015   Procedure: VIDEO BRONCHOSCOPY;  Surgeon: Gaye Pollack, MD;  Location: [facility];  Service: Thoracic;  Laterality: N/A;     reports that he quit smoking about 47 years ago. His smoking use included Cigarettes. He has a 30.00 pack-year smoking history. He has quit using smokeless tobacco. His smokeless tobacco use included Chew and Snuff. He reports that he does not drink alcohol or use drugs.  Allergies  Allergen Reactions  . Codeine Other (See Comments)    : hallucinations,faint,dizziness    Family History  Problem Relation Age of Onset  . Emphysema Mother   . Cancer Father     prostate  . Colon cancer Paternal Grandfather 67  . Cancer Paternal Grandfather     prostate  . Cancer Brother     prostate    Prior to Admission medications   Medication Sig Start Date End Date Taking? Authorizing Provider  acetaminophen (TYLENOL) 500 MG tablet Take 1,000 mg by mouth every 4 (four) hours as needed for mild pain, moderate pain, fever or headache.    Yes Historical Provider, MD  amLODipine (NORVASC) 5  MG tablet Take 1 tablet (5 mg total) by mouth every morning. 11/17/15  Yes [name], MD  aspirin EC 81 MG tablet Take 81 mg by mouth daily.    Yes Historical Provider, MD  brimonidine-timolol (COMBIGAN) 0.2-0.5 % ophthalmic solution Place 1 drop into both eyes every 12 (twelve) hours.   Yes Historical Provider, MD  chlorpheniramine-HYDROcodone (TUSSIONEX) 10-8 MG/5ML SUER Take 5 mLs by mouth every 12 (twelve) hours as needed for cough. 02/08/16  Yes Gery Pray, MD  dexamethasone (DECADRON) 4 MG tablet 4  mg by mouth twice a day the day before, day of and day after the chemotherapy every 3 weeks. 09/29/15  Yes Curt Bears, MD  finasteride ([name]) 5 MG tablet TAKE 1 TABLET BY MOUTH EVERY DAY 12/16/15  Yes [name], MD  fluticasone [facility]) 50 MCG/ACT nasal spray Place 2 sprays into both nostrils daily as needed for allergies or rhinitis. 11/17/15  Yes [name], MD  folic acid (FOLVITE) 1 MG tablet Take 1 tablet (1 mg total) by mouth daily. 09/29/15  Yes Curt Bears, MD  guaiFENesin (MUCINEX) 600 MG 12 hr tablet Take 2 tablets (1,200 mg total) by mouth 2 (two) times daily. Patient taking differently: Take 1,200 mg by mouth 2 (two) times daily as needed for cough.  02/08/16  Yes Gery Pray, MD  lactulose (CHRONULAC) 10 GM/15ML solution Take 10 g by mouth 2 (two) times daily as needed (constipation).    Yes Historical Provider, MD  levothyroxine (SYNTHROID, LEVOTHROID) 100 MCG tablet Take 1 tablet (100 mcg total) by mouth daily. 11/02/15  Yes Philemon Kingdom, MD  Linaclotide Rolan Lipa) 290 MCG CAPS capsule Take 1 capsule (290 mcg total) by mouth daily. 06/07/15  Yes Mauri Pole, MD  meloxicam (MOBIC) 7.5 MG tablet Take 1 tablet (7.5 mg total) by mouth daily. 11/17/15  Yes [name], MD  Multiple Vitamins-Minerals (CENTRUM SILVER 50+MEN) TABS Take 1 tablet by mouth daily.   Yes Historical Provider, MD  omeprazole (PRILOSEC) 20 MG capsule Take 1 capsule (20 mg total) by mouth every morning. 11/17/15  Yes [name], MD  oxyCODONE-acetaminophen (PERCOCET/ROXICET) 5-325 MG tablet Take 1 tablet by mouth every 4 (four) hours as needed for severe pain. 02/08/16  Yes Gery Pray, MD  polyethylene glycol [facility] / GLYCOLAX) packet Take 17 g by mouth daily as needed (constipation). Reported on 06/21/2015   Yes Historical Provider, MD  prochlorperazine (COMPAZINE) 10 MG tablet Take 1 tablet (10 mg total) by mouth every 6 (six) hours as needed for nausea or vomiting. 06/04/15  Yes Curt Bears, MD  sildenafil (VIAGRA) 100 MG tablet Take 1 tablet (100 mg total) by mouth as directed. Patient taking differently: Take 100 mg by mouth as needed for erectile dysfunction.  11/17/15  Yes [name], MD  simvastatin (ZOCOR) 20 MG tablet Take 1 tablet (20 mg total) by mouth at bedtime. 11/17/15  Yes [name], MD  sucralfate (CARAFATE) 1 g tablet Take 1 tablet (1 g total) by mouth 4 (four) times daily -  with meals and at bedtime. Mix in 2Tbs water, swish and swallow 08/01/15  Yes Hayden Pedro, PA-C  tamsulosin (FLOMAX) 0.4 MG CAPS capsule Take 2 capsules (0.8 mg total) by mouth at bedtime. 11/17/15  Yes [name], MD  traMADol (ULTRAM) 50 MG tablet Take 1 tablet (50 mg total) by mouth every 6 (six) hours as needed for moderate pain. 12/23/15  Yes Curt Bears, MD  chlorproMAZINE (THORAZINE) 25 MG tablet Take 1 tablet (25 mg total) by mouth 3 (three) times daily as needed for hiccoughs. 11/24/15   Curt Bears, MD  Cholecalciferol (VITAMIN D PO) Take 500 Units by mouth daily.    Historical Provider, MD    Physical Exam: Vitals:   02/20/16 1100 02/20/16 1101 02/20/16 1141 02/20/16 1142  BP: 124/75  115/77   Pulse: 111  106   Resp: 22  16   Temp:      TempSrc:      SpO2: (!) 88% 94% 90% 94%      Constitutional: NAD, calm, comfortable Vitals:   02/20/16 1100 02/20/16 1101 02/20/16 1141 02/20/16 1142  BP: 124/75  115/77   Pulse: 111  106   Resp: 22  16   Temp:      TempSrc:      SpO2: (!) 88% 94% 90% 94%   Eyes: PERRL, lids and conjunctivae normal ENMT: Mucous membranes are moist. Posterior pharynx clear of any exudate or lesions.Normal dentition.  Neck: normal, supple, no masses, no thyromegaly Respiratory: Good air movement no wheezing no crackles no respiratory sounds on the right. Cardiovascular: Regular rate and rhythm, no murmurs / rubs / gallops. No extremity edema. 2+ pedal pulses. No carotid bruits.  Abdomen: no tenderness, no masses palpated.  No hepatosplenomegaly. Bowel sounds positive.  Musculoskeletal: no clubbing / cyanosis. No joint deformity upper and lower extremities. Good ROM, no contractures. Normal muscle tone.  Skin: no rashes, lesions, ulcers. No induration Neurologic: CN 2-12 grossly intact. Sensation intact, DTR normal. Strength 5/5 in all 4.  Psychiatric: Normal judgment and insight. Alert and oriented x 3. Normal mood.     Labs on Admission: I have personally reviewed following labs and imaging studies  CBC:  Recent Labs Lab 02/14/16 0525 02/20/16 1008  WBC 4.1 4.7  HGB 9.8* 11.6*  HCT 29.7* 34.4*  MCV 88.1 87.1  PLT 413* 403*   Basic Metabolic Panel:  Recent Labs Lab 02/14/16 0525 02/20/16 1008  NA 136 135  K 3.7 3.6  CL 106 102  CO2 25 25  GLUCOSE 91 111*  BUN 9 12  CREATININE 0.75 0.77  CALCIUM 8.3* 9.2   GFR: Estimated Creatinine Clearance: 70.4 mL/min (by C-G formula based on SCr of 0.8 mg/dL). Liver Function Tests: No results for input(s): AST, ALT, ALKPHOS, BILITOT, PROT, ALBUMIN in the last 168 hours. No results for input(s): LIPASE, AMYLASE in the last 168 hours. No results for input(s): AMMONIA in the last 168 hours. Coagulation Profile: No results for input(s): INR, PROTIME in the last 168 hours. Cardiac Enzymes: No results for input(s): CKTOTAL, CKMB, CKMBINDEX, TROPONINI in the last 168 hours. BNP (last 3 results) No results for input(s): PROBNP in the last 8760 hours. HbA1C: No results for input(s): HGBA1C in the last 72 hours. CBG: No results for input(s): GLUCAP in the last 168 hours. Lipid Profile: No results for input(s): CHOL, HDL, LDLCALC, TRIG, CHOLHDL, LDLDIRECT in the last 72 hours. Thyroid Function Tests: No results for input(s): TSH, T4TOTAL, FREET4, T3FREE, THYROIDAB in the last 72 hours. Anemia Panel: No results for input(s): VITAMINB12, FOLATE, FERRITIN, TIBC, IRON, RETICCTPCT in the last 72 hours. Urine analysis:    Component Value Date/Time    COLORURINE AMBER (A) 01/24/2013 1756   APPEARANCEUR CLEAR 01/24/2013 1756   LABSPEC 1.027 01/24/2013 1756   PHURINE 6.0 01/24/2013 1756   GLUCOSEU NEGATIVE 01/24/2013 1756   HGBUR NEGATIVE 01/24/2013 1756   BILIRUBINUR NEGATIVE 01/24/2013 1756  KETONESUR NEGATIVE 01/24/2013 1756   PROTEINUR NEGATIVE 01/24/2013 1756   UROBILINOGEN 0.2 01/24/2013 1756   NITRITE NEGATIVE 01/24/2013 1756   LEUKOCYTESUR NEGATIVE 01/24/2013 1756   Sepsis Labs:  '@LABRCNTIP'$ (procalcitonin:4,lacticidven:4) ) Recent Results (from the past 240 hour(s))  Body fluid culture     Status: None   Collection Time: 02/13/16 12:12 PM  Result Value Ref Range Status   Specimen Description PLEURAL RIGHT  Final   Special Requests NONE  Final   Gram Stain   Final    CYTOSPIN WBC PRESENT,BOTH PMN AND MONONUCLEAR NO ORGANISMS SEEN Gram Stain Report Called to,Read Back By and Verified With: E PELLETIER RN AT 0263 ON 08.14.17 BY SHUEA    Culture   Final    DUE TO VOLUME OF SPECIMEN RECEIVED, FLUID PLACED IN CULTURE BOTTLES PER POLICY. Performed at [facility]    Report Status 02/13/2016 FINAL  Final  Culture, body fluid-bottle     Status: None   Collection Time: 02/13/16 12:12 PM  Result Value Ref Range Status   Specimen Description PLEURAL RIGHT  Final   Special Requests NONE  Final   Culture   Final    NO GROWTH 5 DAYS Performed at [facility]    Report Status 02/18/2016 FINAL  Final     Radiological Exams on Admission: Dg Chest 2 View  Result Date: 02/20/2016 CLINICAL DATA:  Worsening shortness of breath over the past 4 days and patient with a history of lung carcinoma. EXAM: CHEST  2 VIEW COMPARISON:  PA and lateral chest, single view of the chest and CT chest 02/13/2016. FINDINGS: Moderate to moderately large right pleural effusion appears slightly increased in size since the most recent plain film. Opacity in the right hilar and suprahilar region consistent with history of lung carcinoma and  post treatment change is stable in appearance. Trace left pleural effusion is seen. Heart size is normal. Aortic atherosclerosis is identified. No pneumothorax. IMPRESSION: Some increase in a moderate to moderately large right pleural effusion since the most recent examination in patient with known lung carcinoma on the right. No change in a trace left pleural effusion. Atherosclerosis. Electronically Signed   By: Inge Rise M.D.   On: 02/20/2016 10:29    EKG: Independently reviewed.   Assessment/Plan  Acute respiratory failure with hypoxia (HCC) due to Malignant neoplasm of right upper lobe of lung [facility]): We have consulted IR to try perform thoracocentesis today disease hypoxic and tachycardic. I have gotten caught up with Dr. Earlie Server about further interventions we both agree need a Pleurx catheter, so weak have consulted CT as which will come and see the patient after thoracocentesis is performed by IR. Continue nebulizer treatments as he relates it helps with the  Stage IV lung cancer  Lung mass: Only to follow-up with Dr. Earlie Server as an outpatient will continue chemotherapy and radiation.  Essential hypertension: No changes were made.  Hypothyroidism: No changes were made.  BPH: Continue alpha-blocker no changes were made. Next  Anemia of chronic disease: Has remained stable continue to monitor.  Leucocytosis: Likely reactive due to stressed emargination. Recheck in the morning.    DVT prophylaxis: heparin Code Status: full Family Communication: none Disposition Plan: 2-3 days Consults called: Dr. Ricard Dillon Admission status: inpatient   Charlynne Cousins MD [facility] Pager [phone]  If 7PM-7AM, please contact night-coverage [email or web address] Password [facility]  02/20/2016, 11:54 AM

## 2016-02-20 NOTE — ED Notes (Signed)
Pt transport to [place] at present time. Delay in transport to floor related to procedure in process.

## 2016-02-21 ENCOUNTER — Encounter: Payer: Self-pay | Admitting: Radiation Oncology

## 2016-02-21 ENCOUNTER — Ambulatory Visit
Admission: RE | Admit: 2016-02-21 | Discharge: 2016-02-21 | Disposition: A | Discharge status: Home / Self Care | Payer: Medicare HMO | Source: Ambulatory Visit | Attending: Radiation Oncology | Admitting: Radiation Oncology

## 2016-02-21 DIAGNOSIS — Z510 Encounter for antineoplastic radiation therapy: Secondary | ICD-10-CM | POA: Diagnosis not present

## 2016-02-21 DIAGNOSIS — C7951 Secondary malignant neoplasm of bone: Secondary | ICD-10-CM

## 2016-02-21 DIAGNOSIS — J9601 Acute respiratory failure with hypoxia: Secondary | ICD-10-CM

## 2016-02-21 DIAGNOSIS — C3411 Malignant neoplasm of upper lobe, right bronchus or lung: Principal | ICD-10-CM

## 2016-02-21 MED ORDER — SENNOSIDES-DOCUSATE SODIUM 8.6-50 MG PO TABS
2.0000 | ORAL_TABLET | Freq: Two times a day (BID) | ORAL | Status: DC
  Administered 2016-02-21: 2 via ORAL
  Filled 2016-02-21: qty 2

## 2016-02-21 MED ORDER — HYDROCOD POLST-CPM POLST ER 10-8 MG/5ML PO SUER: 5.0000 mL | Freq: Two times a day (BID) | ORAL | 0 refills | Status: DC | PRN

## 2016-02-21 MED ORDER — SENNOSIDES-DOCUSATE SODIUM 8.6-50 MG PO TABS: 2.0000 | ORAL_TABLET | Freq: Two times a day (BID) | ORAL | 0 refills | Status: DC

## 2016-02-21 MED ORDER — MAGNESIUM HYDROXIDE 400 MG/5ML PO SUSP
15.0000 mL | Freq: Once | ORAL | Status: AC
  Administered 2016-02-21: 15 mL via ORAL
  Filled 2016-02-21: qty 30

## 2016-02-21 NOTE — Progress Notes (Signed)
Alan [name] has completed 4 fractions to his right pelvis.  He reports the pain in his right hip is at a 4/10 today.  He also has developed sharp pain that radiates down his left leg that started yesterday.  He continues to use a walker to help him walk.  He will be discharged from the hosptal today.  He reports he had about 3 L of fluid drained from his right lung yesterday.

## 2016-02-21 NOTE — Progress Notes (Addendum)
  Radiation Oncology         [phone]) [phone] ________________________________  Name: Alan [name] MRN: [id number]  Date: 02/21/2016  DOB: 1940-09-26  Weekly Radiation Therapy Management in patient   Metastatic non-small cell lung cancer       ICD-9-CM ICD-10-CM   1. Bone metastasis (HCC) 198.5 C79.51      Current Dose: 12 Gy     Planned Dose:  30 Gy  Narrative . . . . . . . . The patient presents for routine under treatment assessment.                                    Alan [name] has completed 4 fractions to his right pelvis.  He reports the pain in his right hip is at a 4/10 today.  He also has developed sharp pain that radiates down his left leg that started yesterday.  He continues to use a walker to help him walk.  He will be discharged from the hosptal today.  He reports he had about 3 L of fluid drained from his right lung yesterday. The patient indicated that he would like a refill of his tussionex.                                  Set-up films were reviewed.                                 The chart was checked. Physical Findings. . .  Weight essentially stable.  No significant changes. Sitting comfortably in wheel chair. Impression . . . . . . . The patient is tolerating radiation. Plan . . . . . . . . . . . . Continue treatment as planned. I will refill the patient's medication prescription.   ________________________________   Blair Promise, PhD, MD  This document serves as a record of services personally performed by Gery Pray, MD. It was created on his behalf by Truddie Hidden, a trained medical scribe. The creation of this record is based on the scribe's personal observations and the provider's statements to them. This document has been checked and approved by the attending provider.

## 2016-02-21 NOTE — Progress Notes (Signed)
Pt returned from radiation, D/C to home, instructions given to patient and wife acknowledged understanding. Pt stable. SRP, RN

## 2016-02-21 NOTE — Progress Notes (Signed)
Pt is currently recieving Radiation and Chemo, last Chemo treatment was 8/6 and Radiation treatment was 8/18.  Scheduled for next treatment on 8/22- Radiation and 8/23 Radiation and Chemo scheduled, Radiation Oncology notified of pt inpatient status, per patient request. SRP,RN

## 2016-02-21 NOTE — Plan of Care (Signed)
Problem: Bowel/Gastric: Goal: Will not experience complications related to bowel motility Outcome: Not Progressing Pt constipated--request stronger laxative. will notify MD.

## 2016-02-21 NOTE — Care Management Note (Signed)
Case Management Note  Patient Details  Name: Alan [name] MRN: [id number] Date of Birth: 06/18/1941  Subjective/Objective:  75 y/o m admitted w/resp failure.From home.                  Action/Plan:d/c home no needs or orders.   Expected Discharge Date:   (unknown)               Expected Discharge Plan:  Home/Self Care  In-House Referral:     Discharge planning Services  CM Consult  Post Acute Care Choice:    Choice offered to:     DME Arranged:    DME Agency:     HH Arranged:    [place] Agency:     Status of Service:  Completed, signed off  If discussed at [employer] of Stay Meetings, dates discussed:    Additional Comments:  Dessa Phi, RN 02/21/2016, 2:34 PM

## 2016-02-21 NOTE — Discharge Summary (Signed)
[facility] Discharge Summary   Patient: Alan [name]   PCP: [name], MD DOB: 08/07/1940   Date of admission: 02/20/2016   Date of discharge: 02/21/2016     Discharge Diagnoses:  Active Problems:   Lung mass   Malignant neoplasm of right upper lobe of lung ([place])   Acute respiratory failure with hypoxia ([place])   Admitted From: home Disposition:  home  Recommendations for Outpatient Follow-up:  1. Follow-up with PCP as needed. 2. Follow-up with oncology as needed. 3. Follow-up with cardiac thoracic surgery in 1 week for Pleurx catheter.   Follow-up Information    Gaye Pollack, MD. Call in 1 week(s).   Specialty:  Cardiothoracic Surgery Contact information: [address] [place] [address] [phone]        Eilleen Kempf., MD. Call today.   Specialty:  Oncology Why:  as needed Contact information: [place] [address] [phone]          Diet recommendation: Cardiac diet  Activity: The patient is advised to gradually reintroduce usual activities.  Discharge Condition: good  History of present illness: As per the H and P dictated on admission, "Alan [name] is a 75 y.o. 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 Active Problems:   Lung mass   Malignant neoplasm of right upper  lobe of lung ([place])   Acute respiratory failure with hypoxia [facility]) Patient presented with complains of shortness of breath. Chest x-ray was showing significant recurrent right pleural effusion. Patient is status post thoracentesis. Repeat chest x-ray shows significant improvement in pleural effusion. As per discussion with the admitting doctor as well as patient's medical oncologist, cardiothoracic surgery was consulted for Pleurx catheter. With significant improvement in patient's pleural effusion, thoracic surgery recommends to have an outpatient follow-up for the patient for Pleurx catheter. Information was provided to patient regarding home to call for cardiothoracic surgery.  All other chronic medical condition were stable during the hospitalization.   Patient was ambulatory without any assistance. On the day of the discharge the patient's vitals are stable, and no other acute medical condition were reported by patient. the patient was felt safe to be discharge at home with family.  Procedures and Results:  Ultrasound-guided thoracentesis   Consultations:  none  DISCHARGE MEDICATION: Discharge Medication List as of 02/21/2016  1:22 PM    START taking these medications   Details  senna-docusate (SENOKOT-S) 8.6-50 MG tablet Take 2 tablets by mouth 2 (two) times daily., Starting Tue 02/21/2016, Normal      CONTINUE these medications which have NOT CHANGED   Details  acetaminophen (TYLENOL) 500 MG tablet Take 1,000 mg by mouth every 4 (four) hours as needed for mild pain, moderate pain, fever or headache. , Historical Med    amLODipine (NORVASC) 5 MG tablet Take 1 tablet (5 mg total) by mouth every morning., Starting Thu 11/17/2015, Normal  aspirin EC 81 MG tablet Take 81 mg by mouth daily. , Until Discontinued, Historical Med    brimonidine-timolol (COMBIGAN) 0.2-0.5 % ophthalmic solution Place 1 drop into both eyes every 12 (twelve) hours., Until Discontinued, Historical Med      chlorproMAZINE (THORAZINE) 25 MG tablet Take 1 tablet (25 mg total) by mouth 3 (three) times daily as needed for hiccoughs., Starting Thu 11/24/2015, Normal    Cholecalciferol (VITAMIN D PO) Take 500 Units by mouth daily., Historical Med    dexamethasone (DECADRON) 4 MG tablet 4 mg by mouth twice a day the day before, day of and day after the chemotherapy every 3 weeks., Normal    finasteride ([name]) 5 MG tablet TAKE 1 TABLET BY MOUTH EVERY DAY, Normal    fluticasone (FLONASE) 50 MCG/ACT nasal spray Place 2 sprays into both nostrils daily as needed for allergies or rhinitis., Starting Thu 9/48/5462, Normal    folic acid (FOLVITE) 1 MG tablet Take 1 tablet (1 mg total) by mouth daily., Starting Thu 09/29/2015, Normal    guaiFENesin (MUCINEX) 600 MG 12 hr tablet Take 2 tablets (1,200 mg total) by mouth 2 (two) times daily., Starting Wed 02/08/2016, Normal    lactulose (CHRONULAC) 10 GM/15ML solution Take 10 g by mouth 2 (two) times daily as needed (constipation). , Until Discontinued, Historical Med    levothyroxine (SYNTHROID, LEVOTHROID) 100 MCG tablet Take 1 tablet (100 mcg total) by mouth daily., Starting Wed 11/02/2015, Normal    Linaclotide (LINZESS) 290 MCG CAPS capsule Take 1 capsule (290 mcg total) by mouth daily., Starting Tue 06/07/2015, Normal    meloxicam (MOBIC) 7.5 MG tablet Take 1 tablet (7.5 mg total) by mouth daily., Starting Thu 11/17/2015, Normal    Multiple Vitamins-Minerals (CENTRUM SILVER 50+MEN) TABS Take 1 tablet by mouth daily., Historical Med    omeprazole (PRILOSEC) 20 MG capsule Take 1 capsule (20 mg total) by mouth every morning., Starting Thu 11/17/2015, Normal    oxyCODONE-acetaminophen (PERCOCET/ROXICET) 5-325 MG tablet Take 1 tablet by mouth every 4 (four) hours as needed for severe pain., Starting Wed 02/08/2016, Print    polyethylene glycol (MIRALAX / GLYCOLAX) packet Take 17 g by mouth daily as needed (constipation). Reported on 06/21/2015, Until  Discontinued, Historical Med    prochlorperazine (COMPAZINE) 10 MG tablet Take 1 tablet (10 mg total) by mouth every 6 (six) hours as needed for nausea or vomiting., Starting Sat 06/04/2015, Normal    sildenafil (VIAGRA) 100 MG tablet Take 1 tablet (100 mg total) by mouth as directed., Starting Thu 11/17/2015, Normal    simvastatin (ZOCOR) 20 MG tablet Take 1 tablet (20 mg total) by mouth at bedtime., Starting Thu 11/17/2015, Normal    sucralfate (CARAFATE) 1 g tablet Take 1 tablet (1 g total) by mouth 4 (four) times daily -  with meals and at bedtime. Mix in 2Tbs water, swish and swallow, Starting Mon 08/01/2015, Normal    tamsulosin (FLOMAX) 0.4 MG CAPS capsule Take 2 capsules (0.8 mg total) by mouth at bedtime., Starting Thu 11/17/2015, Normal    traMADol (ULTRAM) 50 MG tablet Take 1 tablet (50 mg total) by mouth every 6 (six) hours as needed for moderate pain., Starting Fri 12/23/2015, Print    chlorpheniramine-HYDROcodone (TUSSIONEX) 10-8 MG/5ML SUER Take 5 mLs by mouth every 12 (twelve) hours as needed for cough., Starting Wed 02/08/2016, Normal       Allergies  Allergen Reactions  . Codeine Other (See Comments)    : hallucinations,faint,dizziness   Discharge Instructions  Diet - low sodium heart healthy    Complete by:  As directed   Increase activity slowly    Complete by:  As directed     Discharge Exam: Filed Weights   02/20/16 1430  Weight: 62.8 kg (138 lb 7.2 oz)   Vitals:   02/21/16 0517 02/21/16 1342  BP: 105/66 109/71  Pulse: 91 89  Resp: 16 16  Temp: 98.1 F (36.7 C) 98 F (36.7 C)   General: Appear in no distress, no Rash; Oral Mucosa moist. Cardiovascular: S1 and S2 Present, no Murmur, no JVD Respiratory: Bilateral Air entry present and basal Crackles, no wheezes Abdomen: Bowel Sound present, Soft and no tenderness Extremities: no Pedal edema, no calf tenderness Neurology: Grossly no focal neuro deficit.  The results of significant diagnostics from this  hospitalization (including imaging, microbiology, ancillary and laboratory) are listed below for reference.    Significant Diagnostic Studies: Dg Chest 1 View  Result Date: 02/20/2016 CLINICAL DATA:  Status post right thoracentesis.  Cough. EXAM: CHEST 1 VIEW COMPARISON:  02/20/2016 FINDINGS: Smaller right pleural effusion. No evidence for pneumothorax. Right suprahilar mass again identified. The left lung is clear. Smaller left pleural effusion noted. No pulmonary edema. IMPRESSION: Smaller right pleural effusion.  No pneumothorax. Electronically Signed   By: Nolon Nations M.D.   On: 02/20/2016 13:11   Dg Chest 1 View  Result Date: 02/13/2016 CLINICAL DATA:  75 year old male status post thoracentesis EXAM: CHEST 1 VIEW COMPARISON:  X-ray same day, CT same day FINDINGS: Cardiomediastinal silhouette unchanged. Improved visualization of the right heart border, although the right hemidiaphragm and right cardiophrenic angle are obscured. Similar appearance of right suprahilar density. Improved opacity at the right base status post thoracentesis. Uplifting of the minor fissure persist. Calcifications. No pneumothorax. IMPRESSION: Status post right thoracentesis without pneumothorax. Improved right-sided pleural fluid. Signed, Dulcy Fanny. Earleen Newport, DO [facility] Electronically Signed   By: Corrie Mckusick D.O.   On: 02/13/2016 12:47   Dg Chest 2 View  Result Date: 02/20/2016 CLINICAL DATA:  Worsening shortness of breath over the past 4 days and patient with a history of lung carcinoma. EXAM: CHEST  2 VIEW COMPARISON:  PA and lateral chest, single view of the chest and CT chest 02/13/2016. FINDINGS: Moderate to moderately large right pleural effusion appears slightly increased in size since the most recent plain film. Opacity in the right hilar and suprahilar region consistent with history of lung carcinoma and post treatment change is stable in  appearance. Trace left pleural effusion is seen. Heart size is normal. Aortic atherosclerosis is identified. No pneumothorax. IMPRESSION: Some increase in a moderate to moderately large right pleural effusion since the most recent examination in patient with known lung carcinoma on the right. No change in a trace left pleural effusion. Atherosclerosis. Electronically Signed   By: Inge Rise M.D.   On: 02/20/2016 10:29   Dg Chest 2 View  Result Date: 02/13/2016 CLINICAL DATA:  74 year old male with a history of increasing cough and shortness of breath over 3 days. History of lung cancer EXAM: CHEST  2 VIEW COMPARISON:  PET-CT 12/19/2015, chest CT 12/01/2015, prior chest x-ray 05/31/2015 FINDINGS: Right-sided mediastinal and cardiac border obscured by overlying lung/ pleural disease. Opacity in the right suprahilar region involving the mediastinal border and the right upper lobe. Uplifting of the minor fissure. Dense opacity at the right base obscures the right hemidiaphragm and the right heart border. No pneumothorax. Dense opacity on the  lateral view at the posterior base. Trace opacity at the posterior left base. IMPRESSION: Dense opacity at the right base, likely a combination of volume loss, atelectasis/consolidation, and pleural effusion in this patient with known carcinoma. Opacity in the right suprahilar region, potentially a combination of residual disease and/or treatment effects. Aortic atherosclerosis. Signed, Dulcy Fanny. Earleen Newport, DO [facility]iology Electronically Signed   By: Corrie Mckusick D.O.   On: 02/13/2016 07:17   Ct Angio Chest Pe W And/or Wo Contrast  Result Date: 02/13/2016 CLINICAL DATA:  75 year old male with a history of cough for 2 weeks EXAM: CT ANGIOGRAPHY CHEST WITH CONTRAST TECHNIQUE: Multidetector CT imaging of the chest was performed using the standard protocol during bolus administration of intravenous contrast.  Multiplanar CT image reconstructions and MIPs were obtained to evaluate the vascular anatomy. CONTRAST:  100 cc Isovue 370 COMPARISON:  PET-CT 12/19/2015, CT 12/01/2015 FINDINGS: Chest: Asymmetric breast tissue with left breast tissue measuring 4.7 cm in greatest diameter. This was present on the comparison PET-CT. Bilateral axillary lymph nodes. None of these are enlarged by CT size criteria. No significant supraclavicular adenopathy. Mediastinal structures are shifted from right to left. Central airways are patent to the carina. Bronchus intermedius patent. Proximal bronchi of the right lower lobe are partially occluded. Left-sided airways are clear. Lungs: Right:  Interval development of large right-sided pleural effusion. Lateral segment of the middle lobe demonstrates complete collapse. 50% atelectasis of the right lower lobe. Dense airspace opacity/nodular consolidation of the right upper lobe. Hypodense focus in the region of the treated tumor measures 2.8 cm x 2.4 cm. There are a few adjacent small nodules which were present on the comparison PET-CT. Left: Ground-glass opacity of the left apex more pronounced than the comparison PET-CT now measuring 11 mm. Pleural based nodule of the left upper lobe anteriorly measures 8 mm, increased from the prior. New left lower lobe nodules (image 72), (image 75) measuring 5 mm and 4 mm. Trace left-sided pleural effusion. Vascular: Unremarkable course caliber and contour of the thoracic aorta with no aneurysm or dissection flap. Scattered calcifications. Calcifications of the branch vessels. No central, lobar, segmental pulmonary artery filling defects. Distal to the segmental vessels, study limited by the motion artifact and contrast bolus. Heart size unchanged.  Trace pericardial fluid/thickening. Musculoskeletal: Small lucent lesion of the T5 right transverse process, new from prior. Sclerotic lesion of the T10 vertebral body, similar to prior. Upper abdomen:  Unremarkable appearance of the visualized upper abdomen. Review of the MIP images confirms the above findings. IMPRESSION: New large right-sided pleural effusion contributing to right to left midline shift. There is associated near complete collapse of the right lower lobe, with minimal aeration of the right upper lobe, right middle lobe, and right lower lobe. New nodular soft tissue/consolidation of the right upper lobe in the region of the previous identified right upper lobe tumor, status post initiation of treatment. Given the time interval from the comparison CT of 40/98/1191, is uncertain to what degree these changes represent treatment treatment effects versus progression. At least some of these are favored to represent progression given the appearance of the contralateral lung and evidence of skeletal metastases. Interval enlargement of left lung nodules and development of new nodules compatible with pulmonary progression of metastatic disease. New lucent lesion of the right T5 transverse process. Re- demonstration of T10 vertebral body sclerotic lesion. Asymmetric soft tissue of the left breast, presumably gynecomastia. Trace pericardial effusion. Small left-sided pleural effusion. Aortic atherosclerosis.  Signed, Dulcy Fanny. Earleen Newport, DO [facility] Electronically Signed   By: Corrie Mckusick D.O.   On: 02/13/2016 08:12   US Thoracentesis Asp Pleural Space W/img Guide  Result Date: 02/20/2016 INDICATION: History of metastatic lung cancer. Request is made for diagnostic and therapeutic thoracentesis. EXAM: ULTRASOUND GUIDED DIAGNOSTIC AND THERAPEUTIC THORACENTESIS MEDICATIONS: 1% lidocaine. COMPLICATIONS: None immediate. PROCEDURE: An ultrasound guided thoracentesis was thoroughly discussed with the patient and questions answered. The benefits, risks, alternatives and complications were also discussed. The patient understands and wishes to proceed with  the procedure. Written consent was obtained. Ultrasound was performed to localize and mark an adequate pocket of fluid in the right chest. The area was then prepped and draped in the normal sterile fashion. 1% Lidocaine was used for local anesthesia. Under ultrasound guidance a Safe-T-Centesis catheter was introduced. Thoracentesis was performed. The catheter was removed and a dressing applied. FINDINGS: A total of approximately 2.4 L of yellow fluid was removed. Samples were sent to the laboratory as requested by the clinical team. IMPRESSION: Successful ultrasound guided right thoracentesis yielding 2.4 L of pleural fluid. Read by: Saverio Danker, PA-C Electronically Signed   By: Marybelle Killings M.D.   On: 02/20/2016 13:29   US Thoracentesis Asp Pleural Space W/img Guide  Result Date: 02/13/2016 INDICATION: Patient with history of metastatic lung cancer, right greater than left pleural effusions. Request made for diagnostic and therapeutic right thoracentesis. EXAM: ULTRASOUND GUIDED DIAGNOSTIC AND THERAPEUTIC RIGHT THORACENTESIS MEDICATIONS: None. COMPLICATIONS: None immediate. PROCEDURE: An ultrasound guided thoracentesis was thoroughly discussed with the patient and questions answered. The benefits, risks, alternatives and complications were also discussed. The patient understands and wishes to proceed with the procedure. Written consent was obtained. Ultrasound was performed to localize and mark an adequate pocket of fluid in the right chest. The area was then prepped and draped in the normal sterile fashion. 1% Lidocaine was used for local anesthesia. Under ultrasound guidance a Safe-T-Centesis catheter was introduced. Thoracentesis was performed. The catheter was removed and a dressing applied. FINDINGS: A total of approximately 1.7 liters of slightly hazy, yellow fluid was removed. Samples were sent to the laboratory as requested by the clinical team. Due to this being patient's initial thoracentesis only  the above amount of fluid was removed at this time. IMPRESSION: Successful ultrasound guided diagnostic and therapeutic right thoracentesis yielding 1.7 liters of pleural fluid. Read by: Rowe Izaiyah, PA-C Electronically Signed   By: Lucrezia Europe M.D.   On: 02/13/2016 12:45    Microbiology: Recent Results (from the past 240 hour(s))  Body fluid culture     Status: None   Collection Time: 02/13/16 12:12 PM  Result Value Ref Range Status   Specimen Description PLEURAL RIGHT  Final   Special Requests NONE  Final   Gram Stain   Final    CYTOSPIN WBC PRESENT,BOTH PMN AND MONONUCLEAR NO ORGANISMS SEEN Gram Stain Report Called to,Read Back By and Verified With: E PELLETIER RN AT 6269 ON 08.14.17 BY SHUEA    Culture   Final    DUE TO VOLUME OF SPECIMEN RECEIVED, FLUID PLACED IN CULTURE BOTTLES PER POLICY. Performed at [facility]    Report Status 02/13/2016 FINAL  Final  Culture, body fluid-bottle     Status: None   Collection Time: 02/13/16 12:12 PM  Result Value Ref Range Status   Specimen Description PLEURAL RIGHT  Final   Special Requests NONE  Final   Culture   Final  NO GROWTH 5 DAYS Performed at [facility]    Report Status 02/18/2016 FINAL  Final  Culture, body fluid-bottle     Status: None (Preliminary result)   Collection Time: 02/20/16 12:41 PM  Result Value Ref Range Status   Specimen Description FLUID RIGHT PLEURAL  Final   Special Requests BOTTLES DRAWN AEROBIC AND ANAEROBIC 10CC  Final   Culture   Final    NO GROWTH < 24 HOURS Performed at [facility]    Report Status PENDING  Incomplete  Gram stain     Status: None   Collection Time: 02/20/16 12:41 PM  Result Value Ref Range Status   Specimen Description FLUID RIGHT PLEURAL  Final   Special Requests NONE  Final   Gram Stain   Final    RARE WBC PRESENT, PREDOMINANTLY PMN NO ORGANISMS SEEN Performed at [facility]    Report Status 02/20/2016 FINAL  Final      Labs: CBC:  Recent Labs Lab 02/20/16 1008  WBC 4.7  HGB 11.6*  HCT 34.4*  MCV 87.1  PLT 568*   Basic Metabolic Panel:  Recent Labs Lab 02/20/16 1008  NA 135  K 3.6  CL 102  CO2 25  GLUCOSE 111*  BUN 12  CREATININE 0.77  CALCIUM 9.2   Liver Function Tests: No results for input(s): AST, ALT, ALKPHOS, BILITOT, PROT, ALBUMIN in the last 168 hours. No results for input(s): LIPASE, AMYLASE in the last 168 hours. No results for input(s): AMMONIA in the last 168 hours. Cardiac Enzymes: No results for input(s): CKTOTAL, CKMB, CKMBINDEX, TROPONINI in the last 168 hours. BNP (last 3 results)  Recent Labs  02/13/16 0952  BNP 71.9   CBG: No results for input(s): GLUCAP in the last 168 hours. Time spent: 30 minutes  Signed:  Berle Mull  [facility] 02/21/2016 , 6:54 PM

## 2016-02-21 NOTE — Progress Notes (Signed)
Pt here for patient teaching.  Pt given Radiation and You booklet. Reviewed areas of pertinence such as diarrhea, fatigue, nausea and vomiting, skin changes and urinary and bladder changes . Pt able to give teach back of to pat skin, use unscented/gentle soap, have Imodium on hand and drink plenty of water,avoid applying anything to skin within 4 hours of treatment. Pt demonstrated understanding and verbalizes understanding of information given and will contact nursing with any questions or concerns.

## 2016-02-22 ENCOUNTER — Other Ambulatory Visit: Payer: Self-pay | Admitting: Internal Medicine

## 2016-02-22 ENCOUNTER — Encounter: Payer: Self-pay | Admitting: Internal Medicine

## 2016-02-22 ENCOUNTER — Ambulatory Visit (HOSPITAL_BASED_OUTPATIENT_CLINIC_OR_DEPARTMENT_OTHER): Payer: Medicare HMO | Admitting: Internal Medicine

## 2016-02-22 ENCOUNTER — Ambulatory Visit
Admission: RE | Admit: 2016-02-22 | Discharge: 2016-02-22 | Disposition: A | Discharge status: Home / Self Care | Payer: Medicare HMO | Source: Ambulatory Visit | Attending: Radiation Oncology | Admitting: Radiation Oncology

## 2016-02-22 ENCOUNTER — Ambulatory Visit (HOSPITAL_BASED_OUTPATIENT_CLINIC_OR_DEPARTMENT_OTHER): Payer: Medicare HMO

## 2016-02-22 VITALS — BP 93/61 | HR 118 | Temp 98.5°F | Resp 18 | Ht 66.0 in | Wt 143.0 lb

## 2016-02-22 DIAGNOSIS — E860 Dehydration: Secondary | ICD-10-CM

## 2016-02-22 DIAGNOSIS — C3411 Malignant neoplasm of upper lobe, right bronchus or lung: Secondary | ICD-10-CM

## 2016-02-22 DIAGNOSIS — Z5112 Encounter for antineoplastic immunotherapy: Secondary | ICD-10-CM

## 2016-02-22 DIAGNOSIS — R5382 Chronic fatigue, unspecified: Secondary | ICD-10-CM

## 2016-02-22 DIAGNOSIS — Z79899 Other long term (current) drug therapy: Secondary | ICD-10-CM | POA: Diagnosis not present

## 2016-02-22 DIAGNOSIS — K5900 Constipation, unspecified: Secondary | ICD-10-CM

## 2016-02-22 DIAGNOSIS — M545 Low back pain: Secondary | ICD-10-CM | POA: Diagnosis not present

## 2016-02-22 DIAGNOSIS — Z510 Encounter for antineoplastic radiation therapy: Secondary | ICD-10-CM | POA: Diagnosis present

## 2016-02-22 DIAGNOSIS — Z7982 Long term (current) use of aspirin: Secondary | ICD-10-CM | POA: Diagnosis not present

## 2016-02-22 DIAGNOSIS — J90 Pleural effusion, not elsewhere classified: Secondary | ICD-10-CM

## 2016-02-22 DIAGNOSIS — C7951 Secondary malignant neoplasm of bone: Secondary | ICD-10-CM

## 2016-02-22 DIAGNOSIS — J910 Malignant pleural effusion: Secondary | ICD-10-CM | POA: Diagnosis not present

## 2016-02-22 DIAGNOSIS — Z885 Allergy status to narcotic agent status: Secondary | ICD-10-CM | POA: Diagnosis not present

## 2016-02-22 DIAGNOSIS — R05 Cough: Secondary | ICD-10-CM | POA: Diagnosis not present

## 2016-02-22 HISTORY — DX: Dehydration: E86.0

## 2016-02-22 LAB — CBC WITH DIFFERENTIAL/PLATELET
BASO%: 0.5 % (ref 0.0–2.0)
BASOS ABS: 0 10*3/uL (ref 0.0–0.1)
EOS%: 1.8 % (ref 0.0–7.0)
Eosinophils Absolute: 0.1 10*3/uL (ref 0.0–0.5)
HCT: 35.7 % — ABNORMAL LOW (ref 38.4–49.9)
HEMOGLOBIN: 11.9 g/dL — AB (ref 13.0–17.1)
LYMPH%: 10.9 % — ABNORMAL LOW (ref 14.0–49.0)
MCH: 28.8 pg (ref 27.2–33.4)
MCHC: 33.3 g/dL (ref 32.0–36.0)
MCV: 86.4 fL (ref 79.3–98.0)
MONO#: 0.4 10*3/uL (ref 0.1–0.9)
MONO%: 9.8 % (ref 0.0–14.0)
NEUT%: 77 % — AB (ref 39.0–75.0)
NEUTROS ABS: 3.4 10*3/uL (ref 1.5–6.5)
Platelets: 489 10*3/uL — ABNORMAL HIGH (ref 140–400)
RBC: 4.13 10*6/uL — ABNORMAL LOW (ref 4.20–5.82)
RDW: 13.6 % (ref 11.0–14.6)
WBC: 4.4 10*3/uL (ref 4.0–10.3)
lymph#: 0.5 10*3/uL — ABNORMAL LOW (ref 0.9–3.3)

## 2016-02-22 LAB — COMPREHENSIVE METABOLIC PANEL
ALT: 12 U/L (ref 0–55)
ANION GAP: 10 meq/L (ref 3–11)
AST: 13 U/L (ref 5–34)
Albumin: 2.4 g/dL — ABNORMAL LOW (ref 3.5–5.0)
Alkaline Phosphatase: 77 U/L (ref 40–150)
BUN: 12.9 mg/dL (ref 7.0–26.0)
CALCIUM: 9.6 mg/dL (ref 8.4–10.4)
CHLORIDE: 101 meq/L (ref 98–109)
CO2: 23 meq/L (ref 22–29)
Creatinine: 0.9 mg/dL (ref 0.7–1.3)
EGFR: >90 mL/min/{1.73_m2} (ref >90)
Glucose: 97 mg/dl (ref 70–140)
POTASSIUM: 4 meq/L (ref 3.5–5.1)
SODIUM: 134 meq/L — AB (ref 136–145)
Total Bilirubin: 0.33 mg/dL (ref 0.20–1.20)
Total Protein: 6.7 g/dL (ref 6.4–8.3)

## 2016-02-22 LAB — TSH: TSH: 20.212 m[IU]/L — AB (ref 0.320–4.118)

## 2016-02-22 MED ORDER — SODIUM CHLORIDE 0.9 % IV SOLN
Freq: Once | INTRAVENOUS | Status: AC
  Administered 2016-02-22: 12:00:00 via INTRAVENOUS

## 2016-02-22 MED ORDER — LEVOTHYROXINE SODIUM 125 MCG PO TABS: 100.0000 ug | ORAL_TABLET | Freq: Every day | ORAL | 0 refills | Status: DC

## 2016-02-22 MED ORDER — OXYCODONE-ACETAMINOPHEN 5-325 MG PO TABS: 1.0000 | ORAL_TABLET | ORAL | 0 refills | Status: DC | PRN

## 2016-02-22 NOTE — Progress Notes (Signed)
IV fluids are completed.  Patient states he feels better, his pulse is lower, but he blood pressure is not improved.  Patient has not taken his blood pressure medicine - norvasc today.     Discussed with Dr. [name].  Patient is fine to be discharged.  Patient is to hold blood pressure medication until he discusses with his family physician.       Discussed with patient and wife.  Instructions added to AVS.

## 2016-02-22 NOTE — Progress Notes (Signed)
Call patient with the result and I will order Levothyroxine for him.

## 2016-02-22 NOTE — Progress Notes (Signed)
    [facility] [phone]   [phone]  OFFICE PROGRESS NOTE  NADEL,SCOTT M, MD [address] [place] [address]  DIAGNOSIS: Metastatic non-small cell lung cancer initially diagnosed as Stage IIIA (T1b, N2, M0) non-small cell lung cancer, adenocarcinoma presented with right right upper lobe nodule in addition to mediastinal lymphadenopathy diagnosed in November 2016.  PRIOR THERAPY:  1) Concurrent chemoradiation with weekly carboplatin for AUC of 2 and paclitaxel 45 MG/M2. Status post 5 cycles. Last dose was given 07/18/2015 with partial response. 2) Consolidation chemotherapy with carboplatin for AUC of 5 and Alimta 500 MG/M2 every 3 weeks. First dose 10/06/2015. Status post 3 cycles. 3)  Immunotherapy with Nivolumab 240 MG IV every 2 weeks. First dose 12/28/2015. Status post 4 cycles. Last dose was giving 02/08/2016 discontinued secondary to disease progression.  CURRENT THERAPY: None  INTERVAL HISTORY: Alan [name] 75 y.o. male returns to the clinic today for follow-up visit accompanied by his wife. The patient is complaining of increasing fatigue and weakness as well as shortness of breath with exertion. He was admitted to [facility] twice in the last 2 weeks for recurrent malignant pleural effusion and he has ultrasound-guided thoracentesis twice recently. The last one was performed yesterday was drainage of 2.4 L of fluid. He continues to have pain in the lower back and hip area but much better after he started palliative radiotherapy. He lost few pounds recently. He denied having any fever or chills. He has no nausea or vomiting. His most recent CT scan of the chest showed evidence for disease progression and the patient is here for evaluation and discussion of his treatment options.  MEDICAL HISTORY: Past Medical History:  Diagnosis Date  . Anxiety state, unspecified   . Benign hypertrophy of prostate   . Benign neoplasm of colon     colon polyps  . Cancer (HCC)    prostate cancer  . Chronic fatigue 12/23/2015  . Constipation, chronic   . Diverticulosis of colon (without mention of hemorrhage)   . Encounter for antineoplastic chemotherapy 09/29/2015  . Encounter for antineoplastic immunotherapy 12/23/2015  . Erectile dysfunction   . GERD (gastroesophageal reflux disease)   . Glaucoma   . Hypercholesteremia   . Hypothyroidism   . Irritable bowel syndrome   . Left hip pain 10/27/2015  . Onychomycosis   . Radiation 06/20/15-08/02/15   right upper lung region 60 gray  . Restless legs syndrome (RLS)   . Sleep apnea    does not use cpap  . Unspecified essential hypertension     ALLERGIES:  is allergic to codeine.  MEDICATIONS:  Current Outpatient Prescriptions  Medication Sig Dispense Refill  . acetaminophen (TYLENOL) 500 MG tablet Take 1,000 mg by mouth every 4 (four) hours as needed for mild pain, moderate pain, fever or headache.     . amLODipine (NORVASC) 5 MG tablet Take 1 tablet (5 mg total) by mouth every morning. 90 tablet 3  . aspirin EC 81 MG tablet Take 81 mg by mouth daily.     . brimonidine-timolol (COMBIGAN) 0.2-0.5 % ophthalmic solution Place 1 drop into both eyes every 12 (twelve) hours.    . chlorpheniramine-HYDROcodone (TUSSIONEX) 10-8 MG/5ML SUER Take 5 mLs by mouth every 12 (twelve) hours as needed for cough. 140 mL 0  . chlorproMAZINE (THORAZINE) 25 MG tablet Take 1 tablet (25 mg total) by mouth 3 (three) times daily as needed for hiccoughs. 30 tablet 0  . Cholecalciferol (  VITAMIN D PO) Take 500 Units by mouth daily.    . dexamethasone (DECADRON) 4 MG tablet 4 mg by mouth twice a day the day before, day of and day after the chemotherapy every 3 weeks. 40 tablet 0  . finasteride ([name]) 5 MG tablet TAKE 1 TABLET BY MOUTH EVERY DAY 30 tablet 1  . fluticasone (FLONASE) 50 MCG/ACT nasal spray Place 2 sprays into both nostrils daily as needed for allergies or rhinitis. 48 g 3  . folic acid  (FOLVITE) 1 MG tablet Take 1 tablet (1 mg total) by mouth daily. 30 tablet 2  . guaiFENesin (MUCINEX) 600 MG 12 hr tablet Take 2 tablets (1,200 mg total) by mouth 2 (two) times daily. (Patient taking differently: Take 1,200 mg by mouth 2 (two) times daily as needed for cough. ) 30 tablet 1  . lactulose (CHRONULAC) 10 GM/15ML solution Take 10 g by mouth 2 (two) times daily as needed (constipation).     . levothyroxine (SYNTHROID, LEVOTHROID) 100 MCG tablet Take 1 tablet (100 mcg total) by mouth daily. 45 tablet 2  . Linaclotide (LINZESS) 290 MCG CAPS capsule Take 1 capsule (290 mcg total) by mouth daily. 30 capsule 3  . meloxicam (MOBIC) 7.5 MG tablet Take 1 tablet (7.5 mg total) by mouth daily. 30 tablet 1  . Multiple Vitamins-Minerals (CENTRUM SILVER 50+MEN) TABS Take 1 tablet by mouth daily.    . omeprazole (PRILOSEC) 20 MG capsule Take 1 capsule (20 mg total) by mouth every morning. 90 capsule 0  . oxyCODONE-acetaminophen (PERCOCET/ROXICET) 5-325 MG tablet Take 1 tablet by mouth every 4 (four) hours as needed for severe pain. 30 tablet 0  . polyethylene glycol (MIRALAX / GLYCOLAX) packet Take 17 g by mouth daily as needed (constipation). Reported on 06/21/2015    . prochlorperazine (COMPAZINE) 10 MG tablet Take 1 tablet (10 mg total) by mouth every 6 (six) hours as needed for nausea or vomiting. 30 tablet 0  . senna-docusate (SENOKOT-S) 8.6-50 MG tablet Take 2 tablets by mouth 2 (two) times daily. 30 tablet 0  . sildenafil (VIAGRA) 100 MG tablet Take 1 tablet (100 mg total) by mouth as directed. (Patient taking differently: Take 100 mg by mouth as needed for erectile dysfunction. ) 10 tablet 0  . simvastatin (ZOCOR) 20 MG tablet Take 1 tablet (20 mg total) by mouth at bedtime. 90 tablet 3  . sucralfate (CARAFATE) 1 g tablet Take 1 tablet (1 g total) by mouth 4 (four) times daily -  with meals and at bedtime. Mix in 2Tbs water, swish and swallow 60 tablet 0  . tamsulosin (FLOMAX) 0.4 MG CAPS capsule  Take 2 capsules (0.8 mg total) by mouth at bedtime. 90 capsule 3  . traMADol (ULTRAM) 50 MG tablet Take 1 tablet (50 mg total) by mouth every 6 (six) hours as needed for moderate pain. 30 tablet 0   No current facility-administered medications for this visit.     SURGICAL HISTORY:  Past Surgical History:  Procedure Laterality Date  . APPENDECTOMY  12/2012  . CARDIAC CATHETERIZATION  ? 2008  . COLONOSCOPY    . LAPAROSCOPIC APPENDECTOMY N/A 01/27/2013   Procedure: APPENDECTOMY LAPAROSCOPIC;  Surgeon: Benjamin T Hoxworth, MD;  Location: [facility];  Service: General;  Laterality: N/A;  . MEDIASTINOSCOPY N/A 05/31/2015   Procedure: MEDIASTINOSCOPY;  Surgeon: Bryan K Bartle, MD;  Location: [facility] OR;  Service: Thoracic;  Laterality: N/A;  . none    . THYROIDECTOMY    . TONSILLECTOMY    .   VIDEO BRONCHOSCOPY N/A 05/31/2015   Procedure: VIDEO BRONCHOSCOPY;  Surgeon: Gaye Pollack, MD;  Location: [facility] OR;  Service: Thoracic;  Laterality: N/A;    REVIEW OF SYSTEMS:  Constitutional: positive for anorexia, fatigue and weight loss Eyes: negative Ears, nose, mouth, throat, and face: negative Respiratory: positive for dyspnea on exertion Cardiovascular: positive for tachypnea Gastrointestinal: negative Genitourinary:negative Integument/breast: negative Hematologic/lymphatic: negative Musculoskeletal:positive for back pain and bone pain Neurological: negative Behavioral/Psych: negative Endocrine: negative Allergic/Immunologic: negative   PHYSICAL EXAMINATION: General appearance: alert, cooperative, fatigued and no distress Head: Normocephalic, without obvious abnormality, atraumatic Neck: no adenopathy, no JVD, supple, symmetrical, trachea midline and thyroid not enlarged, symmetric, no tenderness/mass/nodules Lymph nodes: Cervical, supraclavicular, and axillary nodes normal. Resp: clear to auscultation bilaterally Back: symmetric, no curvature. ROM normal. No CVA tenderness. Cardio: regular rate  and rhythm, S1, S2 normal, no murmur, click, rub or gallop GI: soft, non-tender; bowel sounds normal; no masses,  no organomegaly Extremities: extremities normal, atraumatic, no cyanosis or edema Neurologic: Alert and oriented X 3, normal strength and tone. Normal symmetric reflexes. Normal coordination and gait  ECOG PERFORMANCE STATUS: 1 - Symptomatic but completely ambulatory  Blood pressure 93/61, pulse (!) 118, temperature 98.5 F (36.9 C), temperature source Oral, resp. rate 18, height 5' 6" (1.676 m), weight 143 lb (64.9 kg), SpO2 97 %.  LABORATORY DATA: Lab Results  Component Value Date   WBC 4.4 02/22/2016   HGB 11.9 (L) 02/22/2016   HCT 35.7 (L) 02/22/2016   MCV 86.4 02/22/2016   PLT 489 (H) 02/22/2016      Chemistry      Component Value Date/Time   NA 134 (L) 02/22/2016 1029   K 4.0 02/22/2016 1029   CL 102 02/20/2016 1008   CO2 23 02/22/2016 1029   BUN 12.9 02/22/2016 1029   CREATININE 0.9 02/22/2016 1029      Component Value Date/Time   CALCIUM 9.6 02/22/2016 1029   ALKPHOS 77 02/22/2016 1029   AST 13 02/22/2016 1029   ALT 12 02/22/2016 1029   BILITOT 0.33 02/22/2016 1029       RADIOGRAPHIC STUDIES: Dg Chest 1 View  Result Date: 02/20/2016 CLINICAL DATA:  Status post right thoracentesis.  Cough. EXAM: CHEST 1 VIEW COMPARISON:  02/20/2016 FINDINGS: Smaller right pleural effusion. No evidence for pneumothorax. Right suprahilar mass again identified. The left lung is clear. Smaller left pleural effusion noted. No pulmonary edema. IMPRESSION: Smaller right pleural effusion.  No pneumothorax. Electronically Signed   By: Nolon Nations M.D.   On: 02/20/2016 13:11   Dg Chest 1 View  Result Date: 02/13/2016 CLINICAL DATA:  75 year old male status post thoracentesis EXAM: CHEST 1 VIEW COMPARISON:  X-ray same day, CT same day FINDINGS: Cardiomediastinal silhouette unchanged. Improved visualization of the right heart border, although the right hemidiaphragm and  right cardiophrenic angle are obscured. Similar appearance of right suprahilar density. Improved opacity at the right base status post thoracentesis. Uplifting of the minor fissure persist. Calcifications. No pneumothorax. IMPRESSION: Status post right thoracentesis without pneumothorax. Improved right-sided pleural fluid. Signed, Dulcy Fanny. Earleen Newport, DO [facility] Electronically Signed   By: Corrie Mckusick D.O.   On: 02/13/2016 12:47   Dg Chest 2 View  Result Date: 02/20/2016 CLINICAL DATA:  Worsening shortness of breath over the past 4 days and patient with a history of lung carcinoma. EXAM: CHEST  2 VIEW COMPARISON:  PA and lateral chest, single view of the chest and CT chest 02/13/2016. FINDINGS:  Moderate to moderately large right pleural effusion appears slightly increased in size since the most recent plain film. Opacity in the right hilar and suprahilar region consistent with history of lung carcinoma and post treatment change is stable in appearance. Trace left pleural effusion is seen. Heart size is normal. Aortic atherosclerosis is identified. No pneumothorax. IMPRESSION: Some increase in a moderate to moderately large right pleural effusion since the most recent examination in patient with known lung carcinoma on the right. No change in a trace left pleural effusion. Atherosclerosis. Electronically Signed   By: Thomas  Dalessio M.D.   On: 02/20/2016 10:29   Dg Chest 2 View  Result Date: 02/13/2016 CLINICAL DATA:  75-year-old male with a history of increasing cough and shortness of breath over 3 days. History of lung cancer EXAM: CHEST  2 VIEW COMPARISON:  PET-CT 12/19/2015, chest CT 12/01/2015, prior chest x-ray 05/31/2015 FINDINGS: Right-sided mediastinal and cardiac border obscured by overlying lung/ pleural disease. Opacity in the right suprahilar region involving the mediastinal border and the right upper lobe. Uplifting of the minor  fissure. Dense opacity at the right base obscures the right hemidiaphragm and the right heart border. No pneumothorax. Dense opacity on the lateral view at the posterior base. Trace opacity at the posterior left base. IMPRESSION: Dense opacity at the right base, likely a combination of volume loss, atelectasis/consolidation, and pleural effusion in this patient with known carcinoma. Opacity in the right suprahilar region, potentially a combination of residual disease and/or treatment effects. Aortic atherosclerosis. Signed, Jaime S. Wagner, DO [facility] Electronically Signed   By: Jaime  Wagner D.O.   On: 02/13/2016 07:17   Ct Angio Chest Pe W And/or Wo Contrast  Result Date: 02/13/2016 CLINICAL DATA:  75-year-old male with a history of cough for 2 weeks EXAM: CT ANGIOGRAPHY CHEST WITH CONTRAST TECHNIQUE: Multidetector CT imaging of the chest was performed using the standard protocol during bolus administration of intravenous contrast. Multiplanar CT image reconstructions and MIPs were obtained to evaluate the vascular anatomy. CONTRAST:  100 cc Isovue 370 COMPARISON:  PET-CT 12/19/2015, CT 12/01/2015 FINDINGS: Chest: Asymmetric breast tissue with left breast tissue measuring 4.7 cm in greatest diameter. This was present on the comparison PET-CT. Bilateral axillary lymph nodes. None of these are enlarged by CT size criteria. No significant supraclavicular adenopathy. Mediastinal structures are shifted from right to left. Central airways are patent to the carina. Bronchus intermedius patent. Proximal bronchi of the right lower lobe are partially occluded. Left-sided airways are clear. Lungs: Right:  Interval development of large right-sided pleural effusion. Lateral segment of the middle lobe demonstrates complete collapse. 50% atelectasis of the right lower lobe. Dense airspace opacity/nodular consolidation of the right upper lobe. Hypodense focus in  the region of the treated tumor measures 2.8 cm x 2.4 cm. There are a few adjacent small nodules which were present on the comparison PET-CT. Left: Ground-glass opacity of the left apex more pronounced than the comparison PET-CT now measuring 11 mm. Pleural based nodule of the left upper lobe anteriorly measures 8 mm, increased from the prior. New left lower lobe nodules (image 72), (image 75) measuring 5 mm and 4 mm. Trace left-sided pleural effusion. Vascular: Unremarkable course caliber and contour of the thoracic aorta with no aneurysm or dissection flap. Scattered calcifications. Calcifications of the branch vessels. No central, lobar, segmental pulmonary artery filling defects. Distal to the segmental vessels, study limited by the motion artifact and contrast bolus. Heart size unchanged.    Trace pericardial fluid/thickening. Musculoskeletal: Small lucent lesion of the T5 right transverse process, new from prior. Sclerotic lesion of the T10 vertebral body, similar to prior. Upper abdomen: Unremarkable appearance of the visualized upper abdomen. Review of the MIP images confirms the above findings. IMPRESSION: New large right-sided pleural effusion contributing to right to left midline shift. There is associated near complete collapse of the right lower lobe, with minimal aeration of the right upper lobe, right middle lobe, and right lower lobe. New nodular soft tissue/consolidation of the right upper lobe in the region of the previous identified right upper lobe tumor, status post initiation of treatment. Given the time interval from the comparison CT of 12/19/2015, is uncertain to what degree these changes represent treatment treatment effects versus progression. At least some of these are favored to represent progression given the appearance of the contralateral lung and evidence of skeletal metastases. Interval enlargement of left lung nodules and development of new nodules compatible with pulmonary  progression of metastatic disease. New lucent lesion of the right T5 transverse process. Re- demonstration of T10 vertebral body sclerotic lesion. Asymmetric soft tissue of the left breast, presumably gynecomastia. Trace pericardial effusion. Small left-sided pleural effusion. Aortic atherosclerosis. Signed, Jaime S. Wagner, DO [facility] Electronically Signed   By: Jaime  Wagner D.O.   On: 02/13/2016 08:12   Us Thoracentesis Asp Pleural Space W/img Guide  Result Date: 02/20/2016 INDICATION: History of metastatic lung cancer. Request is made for diagnostic and therapeutic thoracentesis. EXAM: ULTRASOUND GUIDED DIAGNOSTIC AND THERAPEUTIC THORACENTESIS MEDICATIONS: 1% lidocaine. COMPLICATIONS: None immediate. PROCEDURE: An ultrasound guided thoracentesis was thoroughly discussed with the patient and questions answered. The benefits, risks, alternatives and complications were also discussed. The patient understands and wishes to proceed with the procedure. Written consent was obtained. Ultrasound was performed to localize and mark an adequate pocket of fluid in the right chest. The area was then prepped and draped in the normal sterile fashion. 1% Lidocaine was used for local anesthesia. Under ultrasound guidance a Safe-T-Centesis catheter was introduced. Thoracentesis was performed. The catheter was removed and a dressing applied. FINDINGS: A total of approximately 2.4 L of yellow fluid was removed. Samples were sent to the laboratory as requested by the clinical team. IMPRESSION: Successful ultrasound guided right thoracentesis yielding 2.4 L of pleural fluid. Read by: Kelly Osborne, PA-C Electronically Signed   By: Arthur  Hoss M.D.   On: 02/20/2016 13:29   Us Thoracentesis Asp Pleural Space W/img Guide  Result Date: 02/13/2016 INDICATION: Patient with history of metastatic lung cancer, right greater than left pleural effusions. Request made for  diagnostic and therapeutic right thoracentesis. EXAM: ULTRASOUND GUIDED DIAGNOSTIC AND THERAPEUTIC RIGHT THORACENTESIS MEDICATIONS: None. COMPLICATIONS: None immediate. PROCEDURE: An ultrasound guided thoracentesis was thoroughly discussed with the patient and questions answered. The benefits, risks, alternatives and complications were also discussed. The patient understands and wishes to proceed with the procedure. Written consent was obtained. Ultrasound was performed to localize and mark an adequate pocket of fluid in the right chest. The area was then prepped and draped in the normal sterile fashion. 1% Lidocaine was used for local anesthesia. Under ultrasound guidance a Safe-T-Centesis catheter was introduced. Thoracentesis was performed. The catheter was removed and a dressing applied. FINDINGS: A total of approximately 1.7 liters of slightly hazy, yellow fluid was removed. Samples were sent to the laboratory as requested by the clinical team. Due to this being patient's initial thoracentesis only the above amount of fluid was removed   at this time. IMPRESSION: Successful ultrasound guided diagnostic and therapeutic right thoracentesis yielding 1.7 liters of pleural fluid. Read by: Kevin Allred, PA-C Electronically Signed   By: D  Hassell M.D.   On: 02/13/2016 12:45    ASSESSMENT AND PLAN: This is a very pleasant 75 years old African-American male recently diagnosed with a stage IIIA non-small cell lung cancer, adenocarcinoma with negative EGFR mutation, negative ALK gene translocation and negative ROS1. He completed a course of concurrent chemoradiation with weekly carboplatin and paclitaxel status post 5 cycles. The patient tolerated his treatment fairly well. The recent CT scan of the chest showed partial response in the primary left upper lobe neoplasm with no residual thoracic adenopathy. He was seen by Dr. Bartle for consideration of surgical resection but the patient became apprehensive about  surgery after discussion of the risk and benefits of the procedure. He completed consolidation chemotherapy with carboplatin and Alimta status post 3 cycles. He tolerated his treatment fairly well. The recent CT scan of the chest showed improvement of the primary right upper lobe tumor but there was seen you solid right upper lobe pulmonary nodules suspicious for metastasis.  The recent PET scan showed reduction in the size and metabolic activity of the dominant right upper lobe pulmonary nodule as well as resolution of the metabolic activity and reduction in the size of the right paratracheal lymph node but unfortunately there was new hypermetabolic nodule within the right upper lobe concerning for a metastatic nodule in addition to a solitary hypermetabolic skeletal metastasis at the T9 vertebral body. The patient was started on treatment with immunotherapy with Nivolumab status post 4 cycles. He tolerated the treatment well but unfortunately the recent CT scan of the chest showed significant disease progression. I discussed the scan results with the patient and his wife and give him several options for management of his condition including palliative care and hospice referral versus consideration of systemic chemotherapy with docetaxel and Cyramza. The patient would like some time to think about his option. For the recurrent malignant right pleural effusion, I will refer the patient to Dr. Bartle for consideration of Pleurx catheter placement for drainage of the recurrent effusion. For the tachycardia and dehydration, I will arrange for the patient to receive 1 L of normal saline in the clinic today. For the low back pain, he is currently on Percocet and I gave him a refill of his medication. I will wait to hear from the patient regarding his treatment options decision. The patient was advised to call immediately if he has any concerning symptoms in the interval. The patient voices understanding of  current disease status and treatment options and is in agreement with the current care plan.  All questions were answered. The patient knows to call the clinic with any problems, questions or concerns. We can certainly see the patient much sooner if necessary.  Disclaimer: This note was dictated with voice recognition software. Similar sounding words can inadvertently be transcribed and may not be corrected upon review.       

## 2016-02-22 NOTE — Patient Instructions (Addendum)
Please hold blood pressure medicine - norvasc until you contact you family physician.  Your blood pressure is still low after you fluids.                                                                                                                                           Dehydration, Adult Dehydration is a condition in which you do not have enough fluid or water in your body. It happens when you take in less fluid than you lose. Vital organs such as the kidneys, brain, and heart cannot function without a proper amount of fluids. Any loss of fluids from the body can cause dehydration.  Dehydration can range from mild to severe. This condition should be treated right away to help prevent it from becoming severe. CAUSES  This condition may be caused by:  Vomiting.  Diarrhea.  Excessive sweating, such as when exercising in hot or humid weather.  Not drinking enough fluid during strenuous exercise or during an illness.  Excessive urine output.  Fever.  Certain medicines. RISK FACTORS This condition is more likely to develop in:  People who are taking certain medicines that cause the body to lose excess fluid (diuretics).   People who have a chronic illness, such as diabetes, that may increase urination.  Older adults.   People who live at high altitudes.   People who participate in endurance sports.  SYMPTOMS  Mild Dehydration  Thirst.  Dry lips.  Slightly dry mouth.  Dry, warm skin. Moderate Dehydration  Very dry mouth.   Muscle cramps.   Dark urine and decreased urine production.   Decreased tear production.   Headache.   Light-headedness, especially when you stand up from a sitting position.  Severe Dehydration  Changes in skin.   Cold and clammy skin.   Skin does not spring back quickly when lightly pinched and released.   Changes in body fluids.   Extreme thirst.   No tears.   Not able to sweat when body temperature is high, such  as in hot weather.   Minimal urine production.   Changes in vital signs.   Rapid, weak pulse (more than 100 beats per minute when you are sitting still).   Rapid breathing.   Low blood pressure.   Other changes.   Sunken eyes.   Cold hands and feet.   Confusion.  Lethargy and difficulty being awakened.  Fainting (syncope).   Short-term weight loss.   Unconsciousness. DIAGNOSIS  This condition may be diagnosed based on your symptoms. You may also have tests to determine how severe your dehydration is. These tests may include:   Urine tests.   Blood tests.  TREATMENT  Treatment for this condition depends on the severity. Mild or moderate dehydration can often be treated at home. Treatment should be started right away. Do not wait until dehydration becomes severe. Severe dehydration needs  to be treated at the hospital. Treatment for Mild Dehydration  Drinking plenty of water to replace the fluid you have lost.   Replacing minerals in your blood (electrolytes) that you may have lost.  Treatment for Moderate Dehydration  Consuming oral rehydration solution (ORS). Treatment for Severe Dehydration  Receiving fluid through an IV tube.   Receiving electrolyte solution through a feeding tube that is passed through your nose and into your stomach (nasogastric tube or NG tube).  Correcting any abnormalities in electrolytes. HOME CARE INSTRUCTIONS   Drink enough fluid to keep your urine clear or pale yellow.   Drink water or fluid slowly by taking small sips. You can also try sucking on ice cubes.  Have food or beverages that contain electrolytes. Examples include bananas and sports drinks.  Take over-the-counter and prescription medicines only as told by your health care provider.   Prepare ORS according to the manufacturer's instructions. Take sips of ORS every 5 minutes until your urine returns to normal.  If you have vomiting or diarrhea,  continue to try to drink water, ORS, or both.   If you have diarrhea, avoid:   Beverages that contain caffeine.   Fruit juice.   Milk.   Carbonated soft drinks.  Do not take salt tablets. This can lead to the condition of having too much sodium in your body (hypernatremia).  SEEK MEDICAL CARE IF:  You cannot eat or drink without vomiting.  You have had moderate diarrhea during a period of more than 24 hours.  You have a fever. SEEK IMMEDIATE MEDICAL CARE IF:   You have extreme thirst.  You have severe diarrhea.  You have not urinated in 6-8 hours, or you have urinated only a small amount of very dark urine.  You have shriveled skin.  You are dizzy, confused, or both.   This information is not intended to replace advice given to you by your health care provider. Make sure you discuss any questions you have with your health care provider.   Document Released: 06/18/2005 Document Revised: 03/09/2015 Document Reviewed: 11/03/2014 Elsevier Interactive Patient Education [employer].

## 2016-02-23 ENCOUNTER — Telehealth: Payer: Self-pay | Admitting: *Deleted

## 2016-02-23 ENCOUNTER — Ambulatory Visit
Admission: RE | Admit: 2016-02-23 | Discharge: 2016-02-23 | Disposition: A | Discharge status: Home / Self Care | Payer: Medicare HMO | Source: Ambulatory Visit | Attending: Radiation Oncology | Admitting: Radiation Oncology

## 2016-02-23 DIAGNOSIS — Z510 Encounter for antineoplastic radiation therapy: Secondary | ICD-10-CM | POA: Diagnosis not present

## 2016-02-23 NOTE — Telephone Encounter (Signed)
Called pt home # no answer, unable to leave message. Called wife on mobile # discussed lab results and rx at his pharmacy. Wife verbalized understanding no futher concerns.

## 2016-02-23 NOTE — Telephone Encounter (Signed)
-----   Message from Curt Bears, MD sent at 02/22/2016  6:19 PM EDT ----- Call patient with the result and I will order Levothyroxine for him.

## 2016-02-24 ENCOUNTER — Ambulatory Visit
Admission: RE | Admit: 2016-02-24 | Discharge: 2016-02-24 | Disposition: A | Discharge status: Home / Self Care | Payer: Medicare HMO | Source: Ambulatory Visit | Attending: Radiation Oncology | Admitting: Radiation Oncology

## 2016-02-24 DIAGNOSIS — Z510 Encounter for antineoplastic radiation therapy: Secondary | ICD-10-CM | POA: Diagnosis not present

## 2016-02-25 ENCOUNTER — Telehealth: Payer: Self-pay | Admitting: Internal Medicine

## 2016-02-25 LAB — CULTURE, BODY FLUID W GRAM STAIN -BOTTLE: Culture: NO GROWTH

## 2016-02-25 LAB — CULTURE, BODY FLUID-BOTTLE

## 2016-02-25 NOTE — Telephone Encounter (Signed)
Added and additional q2w cycle 10/4. Patient to get new schedule at 9/6 visit

## 2016-02-26 DIAGNOSIS — M25559 Pain in unspecified hip: Secondary | ICD-10-CM

## 2016-02-27 ENCOUNTER — Ambulatory Visit
Admission: RE | Admit: 2016-02-27 | Discharge: 2016-02-27 | Disposition: A | Discharge status: Home / Self Care | Payer: Medicare HMO | Source: Ambulatory Visit | Attending: Radiation Oncology | Admitting: Radiation Oncology

## 2016-02-27 DIAGNOSIS — Z510 Encounter for antineoplastic radiation therapy: Secondary | ICD-10-CM | POA: Diagnosis not present

## 2016-02-28 ENCOUNTER — Encounter: Payer: Self-pay | Admitting: Radiation Oncology

## 2016-02-28 ENCOUNTER — Ambulatory Visit
Admission: RE | Admit: 2016-02-28 | Discharge: 2016-02-28 | Disposition: A | Discharge status: Home / Self Care | Payer: Medicare HMO | Source: Ambulatory Visit | Attending: Radiation Oncology | Admitting: Radiation Oncology

## 2016-02-28 ENCOUNTER — Ambulatory Visit: Payer: Medicare HMO

## 2016-02-28 ENCOUNTER — Telehealth: Payer: Self-pay | Admitting: Medical Oncology

## 2016-02-28 ENCOUNTER — Other Ambulatory Visit: Payer: Self-pay | Admitting: Surgery

## 2016-02-28 VITALS — BP 116/77 | HR 127 | Temp 98.0°F | Ht 66.0 in | Wt 139.6 lb

## 2016-02-28 DIAGNOSIS — C7951 Secondary malignant neoplasm of bone: Secondary | ICD-10-CM

## 2016-02-28 DIAGNOSIS — C3490 Malignant neoplasm of unspecified part of unspecified bronchus or lung: Secondary | ICD-10-CM

## 2016-02-28 DIAGNOSIS — Z510 Encounter for antineoplastic radiation therapy: Secondary | ICD-10-CM | POA: Diagnosis not present

## 2016-02-28 NOTE — Progress Notes (Signed)
  Radiation Oncology         [phone] ________________________________  Name: Alan [name] MRN: [id number]  Date: 02/28/2016  DOB: 02-27-1941  Weekly Radiation Therapy Management   Metastatic non-small cell lung cancer       ICD-9-CM ICD-10-CM   1. Bone metastasis (HCC) 198.5 C79.51      Current Dose: 27 Gy     Planned Dose:  30 Gy  Narrative . . . . . . . . The patient presents for routine under treatment assessment.                                    Mr. Hargrove has has completed 9 fractions to his right pelvis.  He reports having less pain in his right hip and more in his left hip.  He reports he is going to See Dr. Cyndia Bent tomorrow about getting a pluerex catheter.  His heart rate was elevated today at 127. He reports having constipation and has trouble starting his urinary stream.  He reports having problems with this in the past but it is worse now.  He reports having a poor energy level. Patient reports that he is feeling short of breath "all the time."  Patient denies pain in the chest area.  Patient reports that he has some pain while coughing.  .                                  Set-up films were reviewed.                                 The chart was checked.  BP 116/77 (BP Location: Right Arm, Patient Position: Sitting)   Pulse (!) 127   Temp 98 F (36.7 C) (Oral)   Ht '5\' 6"'$  (1.676 m)   Wt 139 lb 9.6 oz (63.3 kg)   SpO2 99%   BMI 22.53 kg/m       Physical Findings. . .   Sitting comfortably in wheel chair. Heart has regular rhythm, increased rate. No palpable cervical, supraclavicular, or axillary adenopathy. Abdomen soft, non-tender, normal bowel sounds. Decreased breath sounds in the right lower lung field.   Impression . . . . . . . The patient is tolerating radiation. Plan . . . . . . . . . . . . Continue treatment as planned. After finishing treatment next week, I told the patient that we will schedule a follow up appointment with him in one  month. ________________________________   Blair Promise, PhD, MD  This document serves as a record of services personally performed by Gery Pray, MD. It was created on his behalf by Truddie Hidden, a trained medical scribe. The creation of this record is based on the scribe's personal observations and the provider's statements to them. This document has been checked and approved by the attending provider.

## 2016-02-28 NOTE — Progress Notes (Addendum)
Alan [name] has completed 9 fractions to his right pelvis.  He reports having less pain in his right hip and more in his left hip.  He reports he is going to See Dr. Cyndia Bent tomorrow about getting a pluerex catheter.  His heart rate was elevated today at 127. He reports having constipation and has trouble starting his urinary stream.  He reports having problems with this in the past but it is worse now.  He reports having a poor energy level.  BP 116/77 (BP Location: Right Arm, Patient Position: Sitting)   Pulse (!) 127   Temp 98 F (36.7 C) (Oral)   Ht '5\' 6"'$  (1.676 m)   Wt 139 lb 9.6 oz (63.3 kg)   SpO2 99%   BMI 22.53 kg/m    Wt Readings from Last 3 Encounters:  02/28/16 139 lb 9.6 oz (63.3 kg)  02/22/16 143 lb (64.9 kg)  02/20/16 138 lb 7.2 oz (62.8 kg)

## 2016-02-28 NOTE — Telephone Encounter (Signed)
I left this message on pts voice mail.

## 2016-02-28 NOTE — Telephone Encounter (Signed)
-----   Message from Curt Bears, MD sent at 02/22/2016  6:19 PM EDT ----- Call patient with the result and I will order Levothyroxine for him.

## 2016-02-29 ENCOUNTER — Encounter (HOSPITAL_COMMUNITY): Payer: Self-pay | Admitting: *Deleted

## 2016-02-29 ENCOUNTER — Ambulatory Visit: Payer: Medicare HMO

## 2016-02-29 ENCOUNTER — Ambulatory Visit
Admission: RE | Admit: 2016-02-29 | Discharge: 2016-02-29 | Disposition: A | Discharge status: Home / Self Care | Payer: Medicare HMO | Source: Ambulatory Visit | Attending: Surgery | Admitting: Surgery

## 2016-02-29 ENCOUNTER — Ambulatory Visit
Admission: RE | Admit: 2016-02-29 | Discharge: 2016-02-29 | Disposition: A | Discharge status: Home / Self Care | Payer: Medicare HMO | Source: Ambulatory Visit | Attending: Radiation Oncology | Admitting: Radiation Oncology

## 2016-02-29 ENCOUNTER — Other Ambulatory Visit: Payer: Self-pay | Admitting: *Deleted

## 2016-02-29 ENCOUNTER — Encounter: Payer: Self-pay | Admitting: Radiation Oncology

## 2016-02-29 ENCOUNTER — Encounter: Payer: Self-pay | Admitting: Surgery

## 2016-02-29 ENCOUNTER — Other Ambulatory Visit: Payer: Self-pay | Admitting: Pulmonary Disease

## 2016-02-29 ENCOUNTER — Ambulatory Visit (INDEPENDENT_AMBULATORY_CARE_PROVIDER_SITE_OTHER): Payer: Medicare HMO | Admitting: Surgery

## 2016-02-29 ENCOUNTER — Other Ambulatory Visit: Payer: Self-pay | Admitting: Gastroenterology

## 2016-02-29 VITALS — BP 104/67 | HR 126 | Resp 20 | Ht 66.0 in | Wt 139.0 lb

## 2016-02-29 DIAGNOSIS — J948 Other specified pleural conditions: Secondary | ICD-10-CM | POA: Diagnosis not present

## 2016-02-29 DIAGNOSIS — J90 Pleural effusion, not elsewhere classified: Secondary | ICD-10-CM

## 2016-02-29 DIAGNOSIS — Z510 Encounter for antineoplastic radiation therapy: Secondary | ICD-10-CM | POA: Diagnosis not present

## 2016-02-29 DIAGNOSIS — C3490 Malignant neoplasm of unspecified part of unspecified bronchus or lung: Secondary | ICD-10-CM

## 2016-02-29 MED ORDER — SODIUM CHLORIDE 0.9 % IV SOLN
500.0000 mg | Freq: Once | INTRAVENOUS | Status: DC
  Filled 2016-02-29: qty 500

## 2016-02-29 NOTE — Progress Notes (Signed)
Pt denies being under the care of a cardiologist and denies having a stress test and echo. Pt made aware to stop taking vitamins, fish oil and herbal medications. Do not take any NSAIDs ie: Ibuprofen, Advil, Naproxen, BC and Goody Powder and Mobic. Anesthesia asked to review pt EKG.

## 2016-03-01 ENCOUNTER — Ambulatory Visit (HOSPITAL_COMMUNITY): Payer: Medicare HMO | Admitting: Certified Registered Nurse Anesthetist

## 2016-03-01 ENCOUNTER — Ambulatory Visit (HOSPITAL_COMMUNITY): Payer: Medicare HMO

## 2016-03-01 ENCOUNTER — Ambulatory Visit (HOSPITAL_COMMUNITY)
Admission: RE | Admit: 2016-03-01 | Discharge: 2016-03-01 | Disposition: A | Discharge status: Home / Self Care | Payer: Medicare HMO | Source: Ambulatory Visit | Attending: Surgery | Admitting: Surgery

## 2016-03-01 ENCOUNTER — Other Ambulatory Visit: Payer: Self-pay | Admitting: Internal Medicine

## 2016-03-01 ENCOUNTER — Encounter (HOSPITAL_COMMUNITY)
Admission: RE | Disposition: A | Discharge status: Home / Self Care | Payer: Self-pay | Source: Ambulatory Visit | Attending: Surgery

## 2016-03-01 ENCOUNTER — Telehealth: Payer: Self-pay | Admitting: Medical Oncology

## 2016-03-01 ENCOUNTER — Encounter (HOSPITAL_COMMUNITY): Payer: Self-pay | Admitting: Certified Registered Nurse Anesthetist

## 2016-03-01 DIAGNOSIS — C7800 Secondary malignant neoplasm of unspecified lung: Secondary | ICD-10-CM | POA: Insufficient documentation

## 2016-03-01 DIAGNOSIS — Z923 Personal history of irradiation: Secondary | ICD-10-CM | POA: Insufficient documentation

## 2016-03-01 DIAGNOSIS — C3411 Malignant neoplasm of upper lobe, right bronchus or lung: Secondary | ICD-10-CM

## 2016-03-01 DIAGNOSIS — N400 Enlarged prostate without lower urinary tract symptoms: Secondary | ICD-10-CM | POA: Diagnosis not present

## 2016-03-01 DIAGNOSIS — F419 Anxiety disorder, unspecified: Secondary | ICD-10-CM | POA: Insufficient documentation

## 2016-03-01 DIAGNOSIS — Z7982 Long term (current) use of aspirin: Secondary | ICD-10-CM | POA: Insufficient documentation

## 2016-03-01 DIAGNOSIS — I10 Essential (primary) hypertension: Secondary | ICD-10-CM | POA: Diagnosis not present

## 2016-03-01 DIAGNOSIS — G4730 Sleep apnea, unspecified: Secondary | ICD-10-CM | POA: Diagnosis not present

## 2016-03-01 DIAGNOSIS — J90 Pleural effusion, not elsewhere classified: Secondary | ICD-10-CM

## 2016-03-01 DIAGNOSIS — Z8546 Personal history of malignant neoplasm of prostate: Secondary | ICD-10-CM | POA: Diagnosis not present

## 2016-03-01 DIAGNOSIS — E039 Hypothyroidism, unspecified: Secondary | ICD-10-CM | POA: Diagnosis not present

## 2016-03-01 DIAGNOSIS — H409 Unspecified glaucoma: Secondary | ICD-10-CM | POA: Insufficient documentation

## 2016-03-01 DIAGNOSIS — E7800 Pure hypercholesterolemia, unspecified: Secondary | ICD-10-CM | POA: Insufficient documentation

## 2016-03-01 DIAGNOSIS — Z87891 Personal history of nicotine dependence: Secondary | ICD-10-CM | POA: Diagnosis not present

## 2016-03-01 DIAGNOSIS — J910 Malignant pleural effusion: Secondary | ICD-10-CM | POA: Diagnosis present

## 2016-03-01 DIAGNOSIS — K219 Gastro-esophageal reflux disease without esophagitis: Secondary | ICD-10-CM | POA: Diagnosis not present

## 2016-03-01 HISTORY — PX: CHEST TUBE INSERTION: SHX231

## 2016-03-01 HISTORY — DX: Malignant pleural effusion: J91.0

## 2016-03-01 LAB — COMPREHENSIVE METABOLIC PANEL
ALBUMIN: 2.8 g/dL — AB (ref 3.5–5.0)
ALK PHOS: 66 U/L (ref 38–126)
ALT: 16 U/L — AB (ref 17–63)
ANION GAP: 11 (ref 5–15)
AST: 18 U/L (ref 15–41)
BILIRUBIN TOTAL: 0.7 mg/dL (ref 0.3–1.2)
BUN: 8 mg/dL (ref 6–20)
CALCIUM: 9.6 mg/dL (ref 8.9–10.3)
CO2: 23 mmol/L (ref 22–32)
CREATININE: 0.99 mg/dL (ref 0.61–1.24)
Chloride: 101 mmol/L (ref 101–111)
GFR calc Af Amer: >60 mL/min (ref >60)
GFR calc non Af Amer: >60 mL/min (ref >60)
GLUCOSE: 88 mg/dL (ref 65–99)
Potassium: 3.6 mmol/L (ref 3.5–5.1)
Sodium: 135 mmol/L (ref 135–145)
TOTAL PROTEIN: 7.2 g/dL (ref 6.5–8.1)

## 2016-03-01 LAB — CBC
HCT: 38.8 % — ABNORMAL LOW (ref 39.0–52.0)
Hemoglobin: 12.6 g/dL — ABNORMAL LOW (ref 13.0–17.0)
MCH: 28.4 pg (ref 26.0–34.0)
MCHC: 32.5 g/dL (ref 30.0–36.0)
MCV: 87.6 fL (ref 78.0–100.0)
PLATELETS: 460 10*3/uL — AB (ref 150–400)
RBC: 4.43 MIL/uL (ref 4.22–5.81)
RDW: 13.6 % (ref 11.5–15.5)
WBC: 3.8 10*3/uL — AB (ref 4.0–10.5)

## 2016-03-01 LAB — APTT: aPTT: 37 seconds — ABNORMAL HIGH (ref 24–36)

## 2016-03-01 LAB — PROTIME-INR
INR: 1.17
Prothrombin Time: 15 seconds (ref 11.4–15.2)

## 2016-03-01 SURGERY — INSERTION, PLEURAL DRAINAGE CATHETER
Anesthesia: Monitor Anesthesia Care | Site: Chest | Laterality: Right

## 2016-03-01 MED ORDER — HYDROMORPHONE HCL 1 MG/ML IJ SOLN
INTRAMUSCULAR | Status: AC
  Filled 2016-03-01: qty 1

## 2016-03-01 MED ORDER — FENTANYL CITRATE (PF) 250 MCG/5ML IJ SOLN
INTRAMUSCULAR | Status: AC
  Filled 2016-03-01: qty 5

## 2016-03-01 MED ORDER — PROMETHAZINE HCL 25 MG/ML IJ SOLN
6.2500 mg | INTRAMUSCULAR | Status: DC | PRN
Start: 2016-03-01 — End: 2016-03-01

## 2016-03-01 MED ORDER — PROPOFOL 500 MG/50ML IV EMUL
INTRAVENOUS | Status: DC | PRN
  Administered 2016-03-01: 25 ug/kg/min via INTRAVENOUS

## 2016-03-01 MED ORDER — FENTANYL CITRATE (PF) 100 MCG/2ML IJ SOLN: 25.0000 ug | INTRAMUSCULAR | Status: DC | PRN

## 2016-03-01 MED ORDER — MIDAZOLAM HCL 2 MG/2ML IJ SOLN
INTRAMUSCULAR | Status: AC
  Filled 2016-03-01: qty 2

## 2016-03-01 MED ORDER — DEXTROSE 5 % IV SOLN
1.5000 g | INTRAVENOUS | Status: AC
  Administered 2016-03-01: 1.5 g via INTRAVENOUS

## 2016-03-01 MED ORDER — LIDOCAINE HCL 1 % IJ SOLN
INTRAMUSCULAR | Status: DC | PRN
  Administered 2016-03-01: 23 mL

## 2016-03-01 MED ORDER — FENTANYL CITRATE (PF) 100 MCG/2ML IJ SOLN
INTRAMUSCULAR | Status: DC | PRN
  Administered 2016-03-01 (×3): 25 ug via INTRAVENOUS

## 2016-03-01 MED ORDER — PROPOFOL 10 MG/ML IV BOLUS
INTRAVENOUS | Status: DC | PRN
  Administered 2016-03-01 (×3): 20 mg via INTRAVENOUS
  Administered 2016-03-01: 10 mg via INTRAVENOUS

## 2016-03-01 MED ORDER — SODIUM CHLORIDE 0.9 % IV SOLN
500.0000 mg | Freq: Once | INTRAVENOUS | Status: AC
  Administered 2016-03-01: 500 mg via INTRAPLEURAL
  Filled 2016-03-01: qty 500

## 2016-03-01 MED ORDER — PROPOFOL 10 MG/ML IV BOLUS
INTRAVENOUS | Status: AC
  Filled 2016-03-01: qty 20

## 2016-03-01 MED ORDER — HYDROMORPHONE HCL 1 MG/ML IJ SOLN
0.2500 mg | INTRAMUSCULAR | Status: DC | PRN
  Administered 2016-03-01 (×3): 0.5 mg via INTRAVENOUS

## 2016-03-01 MED ORDER — MIDAZOLAM HCL 5 MG/5ML IJ SOLN
INTRAMUSCULAR | Status: DC | PRN
  Administered 2016-03-01 (×2): 1 mg via INTRAVENOUS

## 2016-03-01 MED ORDER — DEXTROSE 5 % IV SOLN
INTRAVENOUS | Status: AC
  Filled 2016-03-01: qty 1.5

## 2016-03-01 MED ORDER — LIDOCAINE HCL (PF) 1 % IJ SOLN
INTRAMUSCULAR | Status: AC
  Filled 2016-03-01: qty 30

## 2016-03-01 MED ORDER — LACTATED RINGERS IV SOLN
INTRAVENOUS | Status: DC
  Administered 2016-03-01 (×2): via INTRAVENOUS

## 2016-03-01 MED ORDER — 0.9 % SODIUM CHLORIDE (POUR BTL) OPTIME
TOPICAL | Status: DC | PRN
  Administered 2016-03-01: 1000 mL

## 2016-03-01 SURGICAL SUPPLY — 29 items
BRUSH SCRUB EZ PLAIN DRY (MISCELLANEOUS) ×3 IMPLANT
CANISTER SUCTION 2500CC (MISCELLANEOUS) ×3 IMPLANT
COVER SURGICAL LIGHT HANDLE (MISCELLANEOUS) ×3 IMPLANT
DERMABOND ADVANCED (GAUZE/BANDAGES/DRESSINGS) ×2
DERMABOND ADVANCED .7 DNX12 (GAUZE/BANDAGES/DRESSINGS) ×1 IMPLANT
DRAPE C-ARM 42X72 X-RAY (DRAPES) ×3 IMPLANT
DRAPE LAPAROSCOPIC ABDOMINAL (DRAPES) ×3 IMPLANT
DRSG TEGADERM 4X4.75 (GAUZE/BANDAGES/DRESSINGS) ×3 IMPLANT
GAUZE SPONGE 4X4 12PLY STRL (GAUZE/BANDAGES/DRESSINGS) ×3 IMPLANT
GLOVE EUDERMIC 7 POWDERFREE (GLOVE) ×3 IMPLANT
GOWN STRL REUS W/ TWL LRG LVL3 (GOWN DISPOSABLE) ×1 IMPLANT
GOWN STRL REUS W/ TWL XL LVL3 (GOWN DISPOSABLE) ×1 IMPLANT
GOWN STRL REUS W/TWL LRG LVL3 (GOWN DISPOSABLE) ×2
GOWN STRL REUS W/TWL XL LVL3 (GOWN DISPOSABLE) ×2
KIT BASIN OR (CUSTOM PROCEDURE TRAY) ×3 IMPLANT
KIT PLEURX DRAIN CATH 1000ML (MISCELLANEOUS) ×6 IMPLANT
KIT PLEURX DRAIN CATH 15.5FR (DRAIN) ×3 IMPLANT
KIT ROOM TURNOVER OR (KITS) ×3 IMPLANT
NS IRRIG 1000ML POUR BTL (IV SOLUTION) ×3 IMPLANT
PACK GENERAL/GYN (CUSTOM PROCEDURE TRAY) ×3 IMPLANT
PAD ARMBOARD 7.5X6 YLW CONV (MISCELLANEOUS) ×6 IMPLANT
SET DRAINAGE LINE (MISCELLANEOUS) IMPLANT
STOPCOCK 4 WAY LG BORE MALE ST (IV SETS) ×3 IMPLANT
SUT ETHILON 3 0 PS 1 (SUTURE) ×3 IMPLANT
SUT VIC AB 3-0 X1 27 (SUTURE) ×3 IMPLANT
TOWEL OR 17X24 6PK STRL BLUE (TOWEL DISPOSABLE) ×3 IMPLANT
TOWEL OR 17X26 10 PK STRL BLUE (TOWEL DISPOSABLE) ×3 IMPLANT
VALVE REPLACEMENT CAP (MISCELLANEOUS) IMPLANT
WATER STERILE IRR 1000ML POUR (IV SOLUTION) ×3 IMPLANT

## 2016-03-01 NOTE — Anesthesia Postprocedure Evaluation (Signed)
Anesthesia Post Note  Patient: Alan [name]  Procedure(s) Performed: Procedure(s) (LRB): INSERTION PLEURAL DRAINAGE CATHETER with doxycycline pleurodesis (Right)  Patient location during evaluation: PACU Anesthesia Type: MAC Level of consciousness: awake Pain management: pain level controlled Respiratory status: spontaneous breathing Cardiovascular status: stable Anesthetic complications: no    Last Vitals:  Vitals:   03/01/16 1735 03/01/16 1736  BP: (!) 155/87   Pulse:  (!) 121  Resp:  19  Temp:      Last Pain:  Vitals:   03/01/16 1732  TempSrc:   PainSc: 10-Worst pain ever                 EDWARDS,Danai Gotto

## 2016-03-01 NOTE — Progress Notes (Signed)
ED CM consulted concerning [facility] services for Pleur-x cath management. CM met with patient and family in PACU to discuss recommendations for [facility] services assist with  the management of Pleur-X cath, patient and family agreeable with the recommendations. Offered choice of [facility] selected. Referral faxed in to [facility] 336 [phone]. Explained to patient and family that a Nurse will contact them at the verified number within 24 -48 hours  After discharge, also provided [facility] contact information should they need to contact a nurse before they should come out.  Patient and family verbalized understanding teach back was done, no further questions or concerns verbalized. Prescription for cath kits faxed in to [place]. Patient will leave hospital with a case of cath kits this was confirmed  with Serafina Royals  RN. No further ED CM needs identified.

## 2016-03-01 NOTE — Brief Op Note (Addendum)
03/01/2016  5:02 PM  PATIENT:  Alan [name]  75 y.o. male  PRE-OPERATIVE DIAGNOSIS:  RIGHT PLEURAL EFFUSION  POST-OPERATIVE DIAGNOSIS:  RIGHT PLEURAL EFFUSION  PROCEDURE:  Procedure(s): INSERTION PLEURAL DRAINAGE CATHETER with doxycycline pleurodesis (Right)  Doxycycline pleurodesis.  SURGEON:  Surgeon(s) and Role:    * Gaye Pollack, MD - Primary  PHYSICIAN ASSISTANT: none  ASSISTANTS: none   ANESTHESIA:   local and MAC  EBL:  Total I/O In: 1100 [I.V.:1100] Out: 2 [Blood:2]  BLOOD ADMINISTERED:none  DRAINS: right PleurX catheter   LOCAL MEDICATIONS USED:  LIDOCAINE   SPECIMEN:  No Specimen  DISPOSITION OF SPECIMEN:  N/A  COUNTS:  YES  TOURNIQUET:  * No tourniquets in log *  DICTATION: .Note written in EPIC  PLAN OF CARE: Discharge to home after PACU  PATIENT DISPOSITION:  PACU - hemodynamically stable.   Delay start of Pharmacological VTE agent (>24hrs) due to surgical blood loss or risk of bleeding: not applicable

## 2016-03-01 NOTE — Anesthesia Preprocedure Evaluation (Addendum)
Anesthesia Evaluation  Patient identified by MRN, date of birth, ID band Patient awake    Reviewed: Allergy & Precautions, NPO status , Patient's Chart, lab work & pertinent test results  History of Anesthesia Complications Negative for: history of anesthetic complications  Airway Mallampati: II  TM Distance: >3 FB Neck ROM: Full    Dental no notable dental hx. (+) Dental Advisory Given   Pulmonary sleep apnea , former smoker,     + decreased breath sounds      Cardiovascular hypertension,  Rhythm:Regular Rate:Tachycardia     Neuro/Psych PSYCHIATRIC DISORDERS Anxiety negative neurological ROS     GI/Hepatic Neg liver ROS, GERD  ,  Endo/Other  Hypothyroidism   Renal/GU negative Renal ROS     Musculoskeletal   Abdominal   Peds  Hematology negative hematology ROS (+)   Anesthesia Other Findings   Reproductive/Obstetrics                            Anesthesia Physical Anesthesia Plan  ASA: III  Anesthesia Plan: MAC   Post-op Pain Management:    Induction: Intravenous  Airway Management Planned: Simple Face Mask  Additional Equipment:   Intra-op Plan:   Post-operative Plan:   Informed Consent: I have reviewed the patients History and Physical, chart, labs and discussed the procedure including the risks, benefits and alternatives for the proposed anesthesia with the patient or authorized representative who has indicated his/her understanding and acceptance.   Dental advisory given  Plan Discussed with: CRNA, Anesthesiologist and Surgeon  Anesthesia Plan Comments:       Anesthesia Quick Evaluation                                   Anesthesia Evaluation    Airway        Dental   Pulmonary sleep apnea , former smoker,           Cardiovascular hypertension,      Neuro/Psych PSYCHIATRIC DISORDERS Anxiety negative neurological ROS     GI/Hepatic Neg  liver ROS, GERD  ,  Endo/Other  Hypothyroidism   Renal/GU negative Renal ROS     Musculoskeletal   Abdominal   Peds  Hematology negative hematology ROS (+)   Anesthesia Other Findings   Reproductive/Obstetrics                             Anesthesia Physical Anesthesia Plan  ASA: III  Anesthesia Plan: General   Post-op Pain Management:    Induction: Intravenous  Airway Management Planned: Oral ETT  Additional Equipment:   Intra-op Plan:   Post-operative Plan: Extubation in OR  Informed Consent:   Plan Discussed with:   Anesthesia Plan Comments:         Anesthesia Quick Evaluation

## 2016-03-01 NOTE — Interval H&P Note (Signed)
History and Physical Interval Note:  03/01/2016 3:49 PM  Alan [name]  has presented today for surgery, with the diagnosis of RIGHT PLEURAL EFFUSION  The various methods of treatment have been discussed with the patient and family. After consideration of risks, benefits and other options for treatment, the patient has consented to  Procedure(s): INSERTION PLEURAL DRAINAGE CATHETER with doxycycline pleurodesis (Right) as a surgical intervention .  The patient's history has been reviewed, patient examined, no change in status, stable for surgery.  I have reviewed the patient's chart and labs.  Questions were answered to the patient's satisfaction.     Gaye Pollack

## 2016-03-01 NOTE — Discharge Instructions (Signed)
May shower with dressing over catheter.

## 2016-03-01 NOTE — H&P (Signed)
[address]       [place] [address]             [phone]      Cardiothoracic Surgery History and Physical   Alan [name] is an 75 y.o. male.   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  Past Medical History:  Diagnosis Date  . Anxiety state, unspecified   . Benign hypertrophy of prostate   . Benign neoplasm of colon    colon polyps  . Cancer [facility])    prostate cancer  . Chronic fatigue 12/23/2015  . Constipation, chronic   . Dehydration 02/22/2016  . Diverticulosis of colon (without mention of hemorrhage)   . Encounter for antineoplastic chemotherapy 09/29/2015  . Encounter for antineoplastic immunotherapy 12/23/2015  . Erectile dysfunction   . GERD (gastroesophageal reflux disease)   . Glaucoma   . Hypercholesteremia   . Hypothyroidism   . Irritable bowel syndrome   . Left hip pain 10/27/2015  . Malignant pleural effusion    right  . Onychomycosis   . Radiation 06/20/15-08/02/15   right upper lung region 60 gray  . Restless legs syndrome (RLS)   . Sleep apnea    does not use cpap  . Unspecified essential hypertension     Past Surgical History:  Procedure Laterality Date  . APPENDECTOMY  12/2012  . CARDIAC CATHETERIZATION  ? 2008  . COLONOSCOPY    . LAPAROSCOPIC APPENDECTOMY N/A 01/27/2013   Procedure: APPENDECTOMY LAPAROSCOPIC;  Surgeon: Edward Jolly, MD;  Location: [facility];  Service: General;  Laterality: N/A;  . MEDIASTINOSCOPY N/A 05/31/2015   Procedure: MEDIASTINOSCOPY;  Surgeon: Gaye Pollack, MD;  Location: [facility];  Service: Thoracic;  Laterality: N/A;  . none    . THYROIDECTOMY    . TONSILLECTOMY    . VIDEO BRONCHOSCOPY N/A 05/31/2015   Procedure: VIDEO BRONCHOSCOPY;  Surgeon: Gaye Pollack, MD;   Location: [facility] OR;  Service: Thoracic;  Laterality: N/A;    Family History  Problem Relation Age of Onset  . Emphysema Mother   . Cancer Father     prostate  . Colon cancer Paternal Grandfather 74  . Cancer Paternal Grandfather     prostate  . Cancer Brother     prostate   Social History:  reports that he quit smoking about 47 years ago. His smoking use included Cigarettes. He has a 30.00 pack-year smoking history. He has quit using smokeless tobacco. His smokeless tobacco use included Chew and Snuff. He reports that he does not drink alcohol or use drugs.  Allergies:  Allergies  Allergen Reactions  . Codeine Other (See Comments)    : hallucinations,faint,dizziness    Medications Prior to Admission  Medication Sig Dispense Refill  . acetaminophen (TYLENOL) 500 MG tablet Take 1,000 mg by mouth every 4 (four) hours as needed for mild pain, moderate pain, fever or headache.     [name] aspirin EC 81 MG tablet Take 81 mg by mouth daily.     . brimonidine-timolol (COMBIGAN) 0.2-0.5 % ophthalmic solution Place 1 drop into both eyes every 12 (twelve) hours.    . chlorpheniramine-HYDROcodone (TUSSIONEX) 10-8 MG/5ML SUER Take 5 mLs by mouth every 12 (twelve) hours as needed for cough. 140 mL 0  .  chlorproMAZINE (THORAZINE) 25 MG tablet Take 1 tablet (25 mg total) by mouth 3 (three) times daily as needed for hiccoughs. 30 tablet 0  . Cholecalciferol (VITAMIN D PO) Take 500 Units by mouth daily.    [name] dexamethasone (DECADRON) 4 MG tablet 4 mg by mouth twice a day the day before, day of and day after the chemotherapy every 3 weeks. 40 tablet 0  . finasteride ([name]) 5 MG tablet TAKE 1 TABLET BY MOUTH EVERY DAY (Patient taking differently: Take 5 mg by mouth daily) 30 tablet 1  . folic acid (FOLVITE) 1 MG tablet Take 1 tablet (1 mg total) by mouth daily. 30 tablet 2  . guaiFENesin (MUCINEX) 600 MG 12 hr tablet Take 2 tablets (1,200 mg total) by mouth 2 (two) times daily. (Patient taking differently:  Take 1,200 mg by mouth 2 (two) times daily as needed for cough. ) 30 tablet 1  . lactulose (CHRONULAC) 10 GM/15ML solution Take 10 g by mouth 2 (two) times daily as needed (constipation).     [name] levothyroxine (SYNTHROID, LEVOTHROID) 125 MCG tablet Take 125 mcg by mouth daily  0  . meloxicam (MOBIC) 7.5 MG tablet Take 1 tablet (7.5 mg total) by mouth daily. 30 tablet 1  . Multiple Vitamins-Minerals (CENTRUM SILVER 50+MEN) TABS Take 1 tablet by mouth daily.    [name] omeprazole (PRILOSEC) 20 MG capsule Take 1 capsule (20 mg total) by mouth every morning. 90 capsule 0  . oxyCODONE-acetaminophen (PERCOCET/ROXICET) 5-325 MG tablet Take 1 tablet by mouth every 4 (four) hours as needed for severe pain. 30 tablet 0  . polyethylene glycol (MIRALAX / GLYCOLAX) packet Take 17 g by mouth daily as needed (constipation). Reported on 06/21/2015    . prochlorperazine (COMPAZINE) 10 MG tablet Take 1 tablet (10 mg total) by mouth every 6 (six) hours as needed for nausea or vomiting. 30 tablet 0  . senna-docusate (SENOKOT-S) 8.6-50 MG tablet Take 2 tablets by mouth 2 (two) times daily. 30 tablet 0  . simvastatin (ZOCOR) 20 MG tablet Take 1 tablet (20 mg total) by mouth at bedtime. 90 tablet 3  . sucralfate (CARAFATE) 1 g tablet Take 1 tablet (1 g total) by mouth 4 (four) times daily -  with meals and at bedtime. Mix in 2Tbs water, swish and swallow (Patient taking differently: Take 1 g by mouth 4 (four) times daily as needed (for stomach). Mix in 2Tbs water, swish and swallow) 60 tablet 0  . tamsulosin (FLOMAX) 0.4 MG CAPS capsule Take 2 capsules (0.8 mg total) by mouth at bedtime. 90 capsule 3  . traMADol (ULTRAM) 50 MG tablet Take 1 tablet (50 mg total) by mouth every 6 (six) hours as needed for moderate pain. 30 tablet 0  . fluticasone (FLONASE) 50 MCG/ACT nasal spray Place 2 sprays into both nostrils daily as needed for allergies or rhinitis. 48 g 3  . sildenafil (VIAGRA) 100 MG tablet Take 1 tablet (100 mg total) by  mouth as directed. (Patient taking differently: Take 100 mg by mouth as needed for erectile dysfunction. ) 10 tablet 0    Results for orders placed or performed during the hospital encounter of 03/01/16 (from the past 48 hour(s))  APTT     Status: Abnormal   Collection Time: 03/01/16 10:38 AM  Result Value Ref Range   aPTT 37 (H) 24 - 36 seconds    Comment:        IF BASELINE aPTT IS ELEVATED, SUGGEST PATIENT RISK ASSESSMENT BE USED TO  DETERMINE APPROPRIATE ANTICOAGULANT THERAPY.   CBC     Status: Abnormal   Collection Time: 03/01/16 10:38 AM  Result Value Ref Range   WBC 3.8 (L) 4.0 - 10.5 K/uL   RBC 4.43 4.22 - 5.81 MIL/uL   Hemoglobin 12.6 (L) 13.0 - 17.0 g/dL   HCT 38.8 (L) 39.0 - 52.0 %   MCV 87.6 78.0 - 100.0 fL   MCH 28.4 26.0 - 34.0 pg   MCHC 32.5 30.0 - 36.0 g/dL   RDW 13.6 11.5 - 15.5 %   Platelets 460 (H) 150 - 400 K/uL  Comprehensive metabolic panel     Status: Abnormal   Collection Time: 03/01/16 10:38 AM  Result Value Ref Range   Sodium 135 135 - 145 mmol/L   Potassium 3.6 3.5 - 5.1 mmol/L   Chloride 101 101 - 111 mmol/L   CO2 23 22 - 32 mmol/L   Glucose, Bld 88 65 - 99 mg/dL   BUN 8 6 - 20 mg/dL   Creatinine, Ser 0.99 0.61 - 1.24 mg/dL   Calcium 9.6 8.9 - 10.3 mg/dL   Total Protein 7.2 6.5 - 8.1 g/dL   Albumin 2.8 (L) 3.5 - 5.0 g/dL   AST 18 15 - 41 U/L   ALT 16 (L) 17 - 63 U/L   Alkaline Phosphatase 66 38 - 126 U/L   Total Bilirubin 0.7 0.3 - 1.2 mg/dL   GFR calc non Af Amer >60 >60 mL/Alan   GFR calc Af Amer >60 >60 mL/Alan    Comment: (NOTE) The eGFR has been calculated using the CKD EPI equation. This calculation has not been validated in all clinical situations. eGFR's persistently <60 mL/Alan signify possible Chronic Kidney Disease.    Anion gap 11 5 - 15  Protime-INR     Status: None   Collection Time: 03/01/16 10:38 AM  Result Value Ref Range   Prothrombin Time 15.0 11.4 - 15.2 seconds   INR 1.17    Dg Chest 2 View  Result Date:  02/29/2016 CLINICAL DATA:  Lung cancer.  Shortness of breath.  Chest pain . EXAM: CHEST  2 VIEW COMPARISON:  02/20/2016.  02/13/2016.  CT 02/13/2016 . FINDINGS: Mediastinum hilar structures stable. Stable right upper lobe atelectasis and/or mass. Stable right lower lobe atelectasis. Stable right pleural effusion. Heart size stable . IMPRESSION: Chest is unchanged with stable right upper lobe atelectasis and/or mass. Stable right lower lobe atelectasis and right pleural effusion. No acute new findings. Electronically Signed   By: [place]   On: 02/29/2016 10:13    Review of Systems  Constitutional: Positive for malaise/fatigue and weight loss. Negative for chills and fever.  Respiratory: Positive for cough and shortness of breath.   Cardiovascular: Positive for orthopnea and leg swelling. Negative for chest pain.  Gastrointestinal: Negative.   Genitourinary: Negative.   Musculoskeletal: Positive for back pain and joint pain.  Neurological: Negative.   Endo/Heme/Allergies: Negative.     Blood pressure (P) 132/84, pulse (!) (P) 116, temperature (P) 98.3 F (36.8 C), temperature source (P) Oral, resp. rate (P) 20, height (P) _0  (1.676 m), weight (P) 63 kg (139 lb), SpO2 (P) 97 %. Physical Exam  Constitutional: He is oriented to person, place, and time.  Frail, elderly gentleman with increased work of breathing  HENT:  Head: Normocephalic and atraumatic.  Mouth/Throat: Oropharynx is clear and moist.  Eyes: EOM are normal. Pupils are equal, round, and reactive to light.  Neck: Normal range of motion.  Neck supple.  Cardiovascular: Normal rate, regular rhythm and normal heart sounds.   No murmur heard. Respiratory:  Decreased breath sounds on the right  GI: Soft. Bowel sounds are normal.  Musculoskeletal: He exhibits edema.  Lymphadenopathy:    He has no cervical adenopathy.  Neurological: He is alert and oriented to person, place, and time.  Skin: Skin is warm and dry.    Psychiatric: He has a normal mood and affect.     Assessment/Plan  Large malignant right pleural effusion secondary to NSCLC of the right lung. Plan insertion of PleurX catheter. I discussed the procedure with the patient and his wife and they understand the risks and benefits and agree to proceed.  Gaye Pollack, MD 03/01/2016, 3:44 PM

## 2016-03-01 NOTE — Transfer of Care (Signed)
Immediate Anesthesia Transfer of Care Note  Patient: Alan [name]  Procedure(s) Performed: Procedure(s): INSERTION PLEURAL DRAINAGE CATHETER with doxycycline pleurodesis (Right)  Patient Location: PACU  Anesthesia Type:MAC  Level of Consciousness: awake, alert  and oriented  Airway & Oxygen Therapy: Patient Spontanous Breathing  Post-op Assessment: Report given to RN and Post -op Vital signs reviewed and stable  Post vital signs: Reviewed and stable  Last Vitals:  Vitals:   03/01/16 1735 03/01/16 1736  BP: (!) 155/87   Pulse:  (!) 121  Resp:  19  Temp:      Last Pain:  Vitals:   03/01/16 1732  TempSrc:   PainSc: 10-Worst pain ever         Complications: No apparent anesthesia complications

## 2016-03-01 NOTE — Telephone Encounter (Signed)
Pt request referral to [facility]. Note to [place].

## 2016-03-01 NOTE — Op Note (Signed)
CARDIOTHORACIC SURGERY OPERATIVE NOTE   03/01/2016 Alan [name] [id number]  Surgeon: Gaye Pollack, MD   First Assistant: none   Preoperative Diagnosis: Recurrent malignant right pleural effusion  Postoperative Diagnosis: same   Procedure:   1.Insertion of right PleurX catheter   2. Doxycycline pleurodesis  Anesthesia: MAC with local   Clinical History/Surgical Indication:    The patient is a 75 year old gentleman with metastatic non-small cell lung cancer with a large malignant right pleural effusion causing significant shortness of breath. Plan insertion of PleurX catheter. I discussed the procedure with the patie nt and his wife and they understand the risks and benefits and agree to proceed.   Preparation:  The patient was seen in the preoperative holding area and the correct patient, correct operation, correct operative sidewere confirmed with the patient after reviewing the medical record and CT scan. The right side of the chest was signed by me. The consent was signed by me. Preoperative antibiotics were given. The patient was taken back to the operating room and positioned supine on the operating room table. After intravenous sedation by anesthesia the chest was prepped with betadine soap and solution and draped in the usual sterile manner. A surgical time-out was taken and the correct patient,operative side, and operative procedure were confirmed with the nursing and anesthesia staff.   Operative Procedure:   The chest wall entry site was located on the right lateral chest approximately in the 8th ICS. 1% lidocaine was infiltrated in the skin and subcutaneous tissue down to the pleural space. Serous fluid was encountered. A small incision was made and the right pleural space was entered with a needle catheter. The needle was removed and the guidewire inserted into the pleural space. Its position was checked with floroscopy. The skin exit site was chosen on the anterior  chest wall just above the costal margin and lidocaine was infiltrated here and along a subcutaneous tunnel over to the chest wall entry site. A small incision was made at the skin exit site and the Pleurx catheter was tunneled from the exit site over to the chest wall entry site and positioned with the cuff just inside the exit site. An introducer and sheath were inserted into the pleural space over the guide wire and the introducer and wire removed. The catheter was inserted into the pleural space and the sheath removed. The catheter was connected to vacuum bottle suction and 2 liters of yellow serous pleural fluid was removed. A sample was sent for not sent for cytology since we already know that it is a malignant effusion from thoracentesis. The catheter was fixed to the skin at the exit site with a nylon suture and the other incision was closed with a 3-0 vicryl subcuticular suture and Dermabond. Then 15 cc of 1% lidocaine was injected through the catheter to anesthetize the pleural surface. Following this, 500 mg of doxycycline in 50 cc of sterile saline was injected through the catheter. The catheter was capped and a dressing applied over the exit site. The patient tolerated the procedure well with mild coughing. The sponge, needle and instrument counts were correct according to the nurses and the patient was transported to the PACU in stable and satisfactory condition.

## 2016-03-02 ENCOUNTER — Telehealth: Payer: Self-pay | Admitting: Medical Oncology

## 2016-03-02 ENCOUNTER — Encounter: Payer: Self-pay | Admitting: Surgery

## 2016-03-02 ENCOUNTER — Telehealth: Payer: Self-pay | Admitting: *Deleted

## 2016-03-02 ENCOUNTER — Other Ambulatory Visit: Payer: Self-pay | Admitting: Medical Oncology

## 2016-03-02 ENCOUNTER — Telehealth: Payer: Self-pay | Admitting: Surgery

## 2016-03-02 DIAGNOSIS — Z719 Counseling, unspecified: Secondary | ICD-10-CM

## 2016-03-02 LAB — PH, BODY FLUID: PH, BODY FLUID: 7.7

## 2016-03-02 NOTE — Telephone Encounter (Signed)
Call from [facility] stating they were unable to accept referral for home health services as pt insurance is not accepted at this time.  EDCM contacted other home health agencies to find that [facility] is only agency that can accept pt insurance at this time.  EDCM contacted pt to inform him of the change; pt appreciative.

## 2016-03-02 NOTE — Progress Notes (Signed)
[facility] is unable to accept referral at this time due to staffing.  CM Lucia Bitter and Bishop [place] notified.

## 2016-03-02 NOTE — Telephone Encounter (Signed)
Wife returned Diane's call. Gave her the message. She was pleased.

## 2016-03-02 NOTE — Progress Notes (Signed)
HPI:  The patient is a 75 year old gentleman with metastatic non-small cell lung cancer with a large malignant right pleural effusion causing significant shortness of breath. He had a right thoracentesis on 8/14 removing 2.5 L of fluid that had malignant cells. He felt much better afterward but is now short of breath again.  Current Outpatient Prescriptions  Medication Sig Dispense Refill  . acetaminophen (TYLENOL) 500 MG tablet Take 1,000 mg by mouth every 4 (four) hours as needed for mild pain, moderate pain, fever or headache.     [name] aspirin EC 81 MG tablet Take 81 mg by mouth daily.     . brimonidine-timolol (COMBIGAN) 0.2-0.5 % ophthalmic solution Place 1 drop into both eyes every 12 (twelve) hours.    . chlorpheniramine-HYDROcodone (TUSSIONEX) 10-8 MG/5ML SUER Take 5 mLs by mouth every 12 (twelve) hours as needed for cough. 140 mL 0  . chlorproMAZINE (THORAZINE) 25 MG tablet Take 1 tablet (25 mg total) by mouth 3 (three) times daily as needed for hiccoughs. 30 tablet 0  . Cholecalciferol (VITAMIN D PO) Take 500 Units by mouth daily.    [name] dexamethasone (DECADRON) 4 MG tablet 4 mg by mouth twice a day the day before, day of and day after the chemotherapy every 3 weeks. 40 tablet 0  . fluticasone (FLONASE) 50 MCG/ACT nasal spray Place 2 sprays into both nostrils daily as needed for allergies or rhinitis. 48 g 3  . guaiFENesin (MUCINEX) 600 MG 12 hr tablet Take 2 tablets (1,200 mg total) by mouth 2 (two) times daily. (Patient taking differently: Take 1,200 mg by mouth 2 (two) times daily as needed for cough. ) 30 tablet 1  . lactulose (CHRONULAC) 10 GM/15ML solution Take 10 g by mouth 2 (two) times daily as needed (constipation).     [name] levothyroxine (SYNTHROID, LEVOTHROID) 125 MCG tablet Take 125 mcg by mouth daily  0  . meloxicam (MOBIC) 7.5 MG tablet Take 1 tablet (7.5 mg total) by mouth daily. 30 tablet 1  . Multiple Vitamins-Minerals (CENTRUM SILVER 50+MEN) TABS Take 1 tablet by mouth  daily.    [name] omeprazole (PRILOSEC) 20 MG capsule Take 1 capsule (20 mg total) by mouth every morning. 90 capsule 0  . oxyCODONE-acetaminophen (PERCOCET/ROXICET) 5-325 MG tablet Take 1 tablet by mouth every 4 (four) hours as needed for severe pain. 30 tablet 0  . polyethylene glycol (MIRALAX / GLYCOLAX) packet Take 17 g by mouth daily as needed (constipation). Reported on 06/21/2015    . prochlorperazine (COMPAZINE) 10 MG tablet Take 1 tablet (10 mg total) by mouth every 6 (six) hours as needed for nausea or vomiting. 30 tablet 0  . senna-docusate (SENOKOT-S) 8.6-50 MG tablet Take 2 tablets by mouth 2 (two) times daily. 30 tablet 0  . sildenafil (VIAGRA) 100 MG tablet Take 1 tablet (100 mg total) by mouth as directed. (Patient taking differently: Take 100 mg by mouth as needed for erectile dysfunction. ) 10 tablet 0  . simvastatin (ZOCOR) 20 MG tablet Take 1 tablet (20 mg total) by mouth at bedtime. 90 tablet 3  . sucralfate (CARAFATE) 1 g tablet Take 1 tablet (1 g total) by mouth 4 (four) times daily -  with meals and at bedtime. Mix in 2Tbs water, swish and swallow (Patient taking differently: Take 1 g by mouth 4 (four) times daily as needed (for stomach). Mix in 2Tbs water, swish and swallow) 60 tablet 0  . tamsulosin (FLOMAX) 0.4 MG CAPS capsule Take  2 capsules (0.8 mg total) by mouth at bedtime. 90 capsule 3  . traMADol (ULTRAM) 50 MG tablet Take 1 tablet (50 mg total) by mouth every 6 (six) hours as needed for moderate pain. 30 tablet 0  . finasteride ([name]) 5 MG tablet TAKE 1 TABLET BY MOUTH EVERY DAY 30 tablet 0  . LINZESS 290 MCG CAPS capsule TAKE ONE CAPSULE BY MOUTH ONCE DAILY (Patient taking differently: Take 290 mcg by mouth once daily) 30 capsule 3   No current facility-administered medications for this visit.      Physical Exam:  BP 104/67 (BP Location: Left Arm, Patient Position: Sitting, Cuff Size: Small)   Pulse (!) 126   Resp 20   Ht '5\' 6"'$  (1.676 m)   Wt 139 lb (63 kg)    SpO2 97% Comment: RA  BMI 22.44 kg/m    Elderly, frail-appearing gentleman with increased work of breathing Lung exam reveals decreased breath sounds over the RLL Cardiac exam shows a regular rate and rhythm with resting tachycardia. There is no peripheral edema.   Diagnostic Tests:  CLINICAL DATA:  Lung cancer.  Shortness of breath.  Chest pain .  EXAM: CHEST  2 VIEW  COMPARISON:  02/20/2016.  02/13/2016.  CT 02/13/2016 .  FINDINGS: Mediastinum hilar structures stable. Stable right upper lobe atelectasis and/or mass. Stable right lower lobe atelectasis. Stable right pleural effusion. Heart size stable .  IMPRESSION: Chest is unchanged with stable right upper lobe atelectasis and/or mass. Stable right lower lobe atelectasis and right pleural effusion. No acute new findings.   Electronically Signed   By: Marcello Moores  Register   On: 02/29/2016 10:13   Impression:  He has a recurrent large malignant right pleural effusion with progressive dyspnea. This is best treated with a PleurX catheter and doxycycline pleurodesis. I reviewed the CXR with the patient and his wife. I discussed the procedure, alternatives, benefits and risks including but not limited to bleeding, pneumothorax, fever, chest pain, infection, catheter malfunction and recurrent pleural effusion and he understands and agrees to proceed.  Plan:  Insertion of right pleurX catheter and doxycycline pleurodesis on Thursday this week.   Gaye Pollack, MD [facility] [phone]

## 2016-03-02 NOTE — Telephone Encounter (Signed)
This CM met with patient in PACU yesterday to  arranged Pleur-X cath kit delivery and [facility] services. This CM faxed the referral to [facility] yesterday after hour referral line, However, the daytime CM and I was notified this morning that [facility] no longer accepts patient's insurance [employer]. This CM called several local [place] Agencies in which they also did not accept [employer]. As per, Day time CM referral  was accepted by  Dian Situ Liaison with [facility], and patient was updated. At 5p today this CM received a call from [facility] stating that they will not be able to provide services to the patient. CM contacted several [place] agencies in the local area and as far out as [facility]. Due to the short notice and holiday weekend the search was unsuccessful.  CM contacted patient and wife to provide this update. Spoke with patient's wife she reported to me that she was contacted by Dr. Earlene Plater office and he sent his nurse out to their home today change the drainage bag and demonstrated the technique with family.  According to wife she plans to return tomorrow. Wife verbalizes understanding and teach back done.  CM explained to wife I will call to follow up with her on Tuesday  9/3 concerning [facility] services.

## 2016-03-02 NOTE — Telephone Encounter (Signed)
Pt notified that referral done to [facility] and he does not need refill for Folic  Acid.Faxed to pharmacy also.

## 2016-03-05 NOTE — Congregational Nurse Program (Signed)
Congregational Nurse Program Note  Date of Encounter: 03/02/2016  Past Medical History: Past Medical History:  Diagnosis Date  . Anxiety state, unspecified   . Benign hypertrophy of prostate   . Benign neoplasm of colon    colon polyps  . Cancer [facility])    prostate cancer  . Chronic fatigue 12/23/2015  . Constipation, chronic   . Dehydration 02/22/2016  . Diverticulosis of colon (without mention of hemorrhage)   . Encounter for antineoplastic chemotherapy 09/29/2015  . Encounter for antineoplastic immunotherapy 12/23/2015  . Erectile dysfunction   . GERD (gastroesophageal reflux disease)   . Glaucoma   . Hypercholesteremia   . Hypothyroidism   . Irritable bowel syndrome   . Left hip pain 10/27/2015  . Malignant pleural effusion    right  . Onychomycosis   . Radiation 06/20/15-08/02/15   right upper lung region 60 gray  . Restless legs syndrome (RLS)   . Sleep apnea    does not use cpap  . Unspecified essential hypertension     Encounter Details:     CNP Questionnaire - 03/02/16 0049      Patient Demographics   Is this a new or existing patient? Existing   Patient is considered a/an Not Applicable   Race African-American/Black     Patient Assistance   Location of Patient Assistance [facility]   Patient's financial/insurance status Low Income;Medicare   Uninsured Patient No   Patient referred to apply for the following financial assistance [facility] insecurities addressed Not Applicable   Transportation assistance No   Assistance securing medications No   Educational health offerings Cancer;Safety;Health literacy;Nutrition;Spiritual care;Chronic disease;Navigating the healthcare system     Encounter Details   Primary purpose of visit Education/Health Concerns;Safety;Chronic Illness/Condition Visit;Spiritual Care/Support Visit;Post PCP Visit;Navigating the Healthcare System   Was an Emergency Department visit averted? No   Does patient have a  medical provider? Yes   Patient referred to Follow up with established PCP   Was a mental health screening completed? (GAINS tool) No   Does patient have dental issues? No   Does patient have vision issues? Yes   Was a vision referral made? No   Does your patient have an abnormal blood pressure today? No   Since previous encounter, have you referred patient for abnormal blood pressure that resulted in a new diagnosis or medication change? No   Does your patient have an abnormal blood glucose today? No   Since previous encounter, have you referred patient for abnormal blood glucose that resulted in a new diagnosis or medication change? No   Was there a life-saving intervention made? No      Conference with Lynard Postlewait wife. Client had a procedure to place a "tube" in chest to reduce fluid buildup in chest.  Had been told agency would send nurse to redress on Saturday.  They will be teaching her how to do the dressing.  She had called [facility] and they were going to find someone to come.  She wasn't sure  about insurance.  Discussed other agencies.  Decided  She would call the dr. To check on arrangements.  Called back to say dr. Had referred and she should not worry.  To call me if problems arise

## 2016-03-06 ENCOUNTER — Telehealth: Payer: Self-pay | Admitting: Surgery

## 2016-03-06 ENCOUNTER — Telehealth: Payer: Self-pay | Admitting: Internal Medicine

## 2016-03-06 NOTE — Telephone Encounter (Signed)
Faxed pt records to the new pt scheduler [employer]. After review of the records the scheduler will call pt with appt.  Pt's dtr is aware.

## 2016-03-06 NOTE — Telephone Encounter (Signed)
ED CM contacted patient's wife concerning [facility]. CM confirmed today with Edwina Liaison from [facility] to start on Thursday 9/7. Patient and wife are agreeable with transitional care plan. Patient and wife denies any further questions or concerns. CM provided contact information should any additional questions or concerns should arise.  CM will follow up with patient and wife on Friday 9/8

## 2016-03-07 ENCOUNTER — Other Ambulatory Visit (HOSPITAL_BASED_OUTPATIENT_CLINIC_OR_DEPARTMENT_OTHER): Payer: Medicare HMO

## 2016-03-07 ENCOUNTER — Ambulatory Visit (HOSPITAL_BASED_OUTPATIENT_CLINIC_OR_DEPARTMENT_OTHER): Payer: Medicare HMO

## 2016-03-07 ENCOUNTER — Encounter: Payer: Self-pay | Admitting: *Deleted

## 2016-03-07 ENCOUNTER — Encounter: Payer: Self-pay | Admitting: Internal Medicine

## 2016-03-07 ENCOUNTER — Ambulatory Visit (HOSPITAL_BASED_OUTPATIENT_CLINIC_OR_DEPARTMENT_OTHER): Payer: Medicare HMO | Admitting: Internal Medicine

## 2016-03-07 VITALS — BP 126/65 | HR 103 | Temp 97.9°F

## 2016-03-07 VITALS — BP 86/55 | HR 98 | Temp 97.5°F | Resp 16 | Ht 66.0 in | Wt 132.2 lb

## 2016-03-07 DIAGNOSIS — M545 Low back pain: Secondary | ICD-10-CM

## 2016-03-07 DIAGNOSIS — C3411 Malignant neoplasm of upper lobe, right bronchus or lung: Secondary | ICD-10-CM | POA: Diagnosis not present

## 2016-03-07 DIAGNOSIS — E860 Dehydration: Secondary | ICD-10-CM

## 2016-03-07 DIAGNOSIS — R5382 Chronic fatigue, unspecified: Secondary | ICD-10-CM

## 2016-03-07 DIAGNOSIS — R5383 Other fatigue: Secondary | ICD-10-CM

## 2016-03-07 DIAGNOSIS — C7951 Secondary malignant neoplasm of bone: Secondary | ICD-10-CM

## 2016-03-07 DIAGNOSIS — R300 Dysuria: Secondary | ICD-10-CM

## 2016-03-07 DIAGNOSIS — R630 Anorexia: Secondary | ICD-10-CM

## 2016-03-07 DIAGNOSIS — F329 Major depressive disorder, single episode, unspecified: Secondary | ICD-10-CM

## 2016-03-07 DIAGNOSIS — R634 Abnormal weight loss: Secondary | ICD-10-CM

## 2016-03-07 HISTORY — DX: Dysuria: R30.0

## 2016-03-07 LAB — URINALYSIS, MICROSCOPIC - CHCC
BILIRUBIN (URINE): NEGATIVE
Bacteria, UA: NEGATIVE
Blood: NEGATIVE
Glucose: NEGATIVE mg/dL
KETONES: 15 mg/dL
LEUKOCYTE ESTERASE: NEGATIVE
Nitrite: NEGATIVE
PH: 6 (ref 4.6–8.0)
RBC / HPF: NEGATIVE (ref 0–2)
SPECIFIC GRAVITY, URINE: 1.015 (ref 1.003–1.035)
Urobilinogen, UR: 0.2 mg/dL (ref 0.2–1)

## 2016-03-07 LAB — CBC WITH DIFFERENTIAL/PLATELET
BASO%: 0.8 % (ref 0.0–2.0)
Basophils Absolute: 0 10*3/uL (ref 0.0–0.1)
EOS ABS: 0 10*3/uL (ref 0.0–0.5)
EOS%: 1.6 % (ref 0.0–7.0)
HEMATOCRIT: 35 % — AB (ref 38.4–49.9)
HEMOGLOBIN: 11.3 g/dL — AB (ref 13.0–17.1)
LYMPH#: 0.4 10*3/uL — AB (ref 0.9–3.3)
LYMPH%: 12.9 % — ABNORMAL LOW (ref 14.0–49.0)
MCH: 27.9 pg (ref 27.2–33.4)
MCHC: 32.3 g/dL (ref 32.0–36.0)
MCV: 86.4 fL (ref 79.3–98.0)
MONO#: 0.4 10*3/uL (ref 0.1–0.9)
MONO%: 14.5 % — ABNORMAL HIGH (ref 0.0–14.0)
NEUT%: 70.2 % (ref 39.0–75.0)
NEUTROS ABS: 2 10*3/uL (ref 1.5–6.5)
PLATELETS: 372 10*3/uL (ref 140–400)
RBC: 4.05 10*6/uL — ABNORMAL LOW (ref 4.20–5.82)
RDW: 14.5 % (ref 11.0–14.6)
WBC: 2.9 10*3/uL — AB (ref 4.0–10.3)

## 2016-03-07 LAB — COMPREHENSIVE METABOLIC PANEL
ALBUMIN: 2.2 g/dL — AB (ref 3.5–5.0)
ALK PHOS: 70 U/L (ref 40–150)
ALT: 14 U/L (ref 0–55)
ANION GAP: 11 meq/L (ref 3–11)
AST: 16 U/L (ref 5–34)
BILIRUBIN TOTAL: 0.3 mg/dL (ref 0.20–1.20)
BUN: 6.7 mg/dL — ABNORMAL LOW (ref 7.0–26.0)
CO2: 25 meq/L (ref 22–29)
CREATININE: 0.8 mg/dL (ref 0.7–1.3)
Calcium: 9.5 mg/dL (ref 8.4–10.4)
Chloride: 102 mEq/L (ref 98–109)
Glucose: 111 mg/dl (ref 70–140)
Potassium: 3.3 mEq/L — ABNORMAL LOW (ref 3.5–5.1)
Sodium: 137 mEq/L (ref 136–145)
TOTAL PROTEIN: 6.4 g/dL (ref 6.4–8.3)

## 2016-03-07 MED ORDER — MIRTAZAPINE 30 MG PO TABS: 30.0000 mg | ORAL_TABLET | Freq: Every day | ORAL | 0 refills | Status: DC

## 2016-03-07 MED ORDER — SODIUM CHLORIDE 0.9 % IV SOLN
Freq: Once | INTRAVENOUS | Status: AC
  Administered 2016-03-07: 13:00:00 via INTRAVENOUS

## 2016-03-07 NOTE — Progress Notes (Signed)
[place] [phone]   [phone]  OFFICE PROGRESS NOTE  [name], MD [place] [address]  DIAGNOSIS: Metastatic non-small cell lung cancer initially diagnosed as Stage IIIA (T1b, N2, M0) non-small cell lung cancer, adenocarcinoma presented with right right upper lobe nodule in addition to mediastinal lymphadenopathy diagnosed in November 2016.  MOLECULAR STUDIES (Foundation one):  Genomic Alterations Identified? KRAS G12C STK11 Q220*   PRIOR THERAPY:  1) Concurrent chemoradiation with weekly carboplatin for AUC of 2 and paclitaxel 45 MG/M2. Status post 5 cycles. Last dose was given 07/18/2015 with partial response. 2) Consolidation chemotherapy with carboplatin for AUC of 5 and Alimta 500 MG/M2 every 3 weeks. First dose 10/06/2015. Status post 3 cycles. 3)  Immunotherapy with Nivolumab 240 MG IV every 2 weeks. First dose 12/28/2015. Status post 4 cycles. Last dose was giving 02/08/2016 discontinued secondary to disease progression.  CURRENT THERAPY: None  INTERVAL HISTORY: JABREE REBERT 75 y.o. male returns to the clinic today for follow-up visit accompanied by his wife and daughter. The patient is complaining of increasing fatigue and weakness as well as lack of appetite and dehydration. He had a Pleurx catheter placed for drainage of the right pleural effusion and he is feeling much better with less shortness of breath. He also complained of dysuria. His family requested a referral to [facility] for a second opinion. His appointment has not been scheduled yet. He doesn't think that he can tolerate any systemic chemotherapy at this point. He is here today for reevaluation. Has some weight loss. He denied having any significant nausea or vomiting. MEDICAL HISTORY: Past Medical History:  Diagnosis Date  . Anxiety state, unspecified   . Benign hypertrophy of prostate   . Benign neoplasm of colon    colon  polyps  . Cancer [facility])    prostate cancer  . Chronic fatigue 12/23/2015  . Constipation, chronic   . Dehydration 02/22/2016  . Diverticulosis of colon (without mention of hemorrhage)   . Encounter for antineoplastic chemotherapy 09/29/2015  . Encounter for antineoplastic immunotherapy 12/23/2015  . Erectile dysfunction   . GERD (gastroesophageal reflux disease)   . Glaucoma   . Hypercholesteremia   . Hypothyroidism   . Irritable bowel syndrome   . Left hip pain 10/27/2015  . Malignant pleural effusion    right  . Onychomycosis   . Radiation 06/20/15-08/02/15   right upper lung region 60 gray  . Restless legs syndrome (RLS)   . Sleep apnea    does not use cpap  . Unspecified essential hypertension     ALLERGIES:  is allergic to codeine.  MEDICATIONS:  Current Outpatient Prescriptions  Medication Sig Dispense Refill  . acetaminophen (TYLENOL) 500 MG tablet Take 1,000 mg by mouth every 4 (four) hours as needed for mild pain, moderate pain, fever or headache.     [name] aspirin EC 81 MG tablet Take 81 mg by mouth daily.     . brimonidine-timolol (COMBIGAN) 0.2-0.5 % ophthalmic solution Place 1 drop into both eyes every 12 (twelve) hours.    . chlorpheniramine-HYDROcodone (TUSSIONEX) 10-8 MG/5ML SUER Take 5 mLs by mouth every 12 (twelve) hours as needed for cough. 140 mL 0  . Cholecalciferol (VITAMIN D PO) Take 500 Units by mouth daily.    . finasteride ([name]) 5 MG tablet TAKE 1 TABLET BY MOUTH EVERY DAY 30 tablet 0  . fluticasone (FLONASE) 50 MCG/ACT nasal spray Place 2 sprays into  both nostrils daily as needed for allergies or rhinitis. 48 g 3  . levothyroxine (SYNTHROID, LEVOTHROID) 125 MCG tablet Take 125 mcg by mouth daily  0  . oxyCODONE-acetaminophen (PERCOCET/ROXICET) 5-325 MG tablet Take 1 tablet by mouth every 4 (four) hours as needed for severe pain. 30 tablet 0  . chlorproMAZINE (THORAZINE) 25 MG tablet Take 1 tablet (25 mg total) by mouth 3 (three) times daily as needed for  hiccoughs. (Patient not taking: Reported on 03/07/2016) 30 tablet 0  . guaiFENesin (MUCINEX) 600 MG 12 hr tablet Take 2 tablets (1,200 mg total) by mouth 2 (two) times daily. (Patient not taking: Reported on 03/07/2016) 30 tablet 1  . lactulose (CHRONULAC) 10 GM/15ML solution Take 10 g by mouth 2 (two) times daily as needed (constipation).     [name] LINZESS 290 MCG CAPS capsule TAKE ONE CAPSULE BY MOUTH ONCE DAILY (Patient taking differently: Take 290 mcg by mouth once daily) 30 capsule 3  . meloxicam (MOBIC) 7.5 MG tablet Take 1 tablet (7.5 mg total) by mouth daily. 30 tablet 1  . Multiple Vitamins-Minerals (CENTRUM SILVER 50+MEN) TABS Take 1 tablet by mouth daily.    [name] omeprazole (PRILOSEC) 20 MG capsule Take 1 capsule (20 mg total) by mouth every morning. 90 capsule 0  . polyethylene glycol (MIRALAX / GLYCOLAX) packet Take 17 g by mouth daily as needed (constipation). Reported on 06/21/2015    . prochlorperazine (COMPAZINE) 10 MG tablet Take 1 tablet (10 mg total) by mouth every 6 (six) hours as needed for nausea or vomiting. (Patient not taking: Reported on 03/07/2016) 30 tablet 0  . senna-docusate (SENOKOT-S) 8.6-50 MG tablet Take 2 tablets by mouth 2 (two) times daily. 30 tablet 0  . sildenafil (VIAGRA) 100 MG tablet Take 1 tablet (100 mg total) by mouth as directed. (Patient taking differently: Take 100 mg by mouth as needed for erectile dysfunction. ) 10 tablet 0  . simvastatin (ZOCOR) 20 MG tablet Take 1 tablet (20 mg total) by mouth at bedtime. 90 tablet 3  . sucralfate (CARAFATE) 1 g tablet Take 1 tablet (1 g total) by mouth 4 (four) times daily -  with meals and at bedtime. Mix in 2Tbs water, swish and swallow (Patient taking differently: Take 1 g by mouth 4 (four) times daily as needed (for stomach). Mix in 2Tbs water, swish and swallow) 60 tablet 0  . tamsulosin (FLOMAX) 0.4 MG CAPS capsule Take 2 capsules (0.8 mg total) by mouth at bedtime. 90 capsule 3  . traMADol (ULTRAM) 50 MG tablet Take 1  tablet (50 mg total) by mouth every 6 (six) hours as needed for moderate pain. 30 tablet 0   No current facility-administered medications for this visit.     SURGICAL HISTORY:  Past Surgical History:  Procedure Laterality Date  . APPENDECTOMY  12/2012  . CARDIAC CATHETERIZATION  ? 2008  . CHEST TUBE INSERTION Right 03/01/2016   Procedure: INSERTION PLEURAL DRAINAGE CATHETER with doxycycline pleurodesis;  Surgeon: Gaye Pollack, MD;  Location: [facility] OR;  Service: Thoracic;  Laterality: Right;  . COLONOSCOPY    . LAPAROSCOPIC APPENDECTOMY N/A 01/27/2013   Procedure: APPENDECTOMY LAPAROSCOPIC;  Surgeon: Edward Jolly, MD;  Location: [facility] ORS;  Service: General;  Laterality: N/A;  . MEDIASTINOSCOPY N/A 05/31/2015   Procedure: MEDIASTINOSCOPY;  Surgeon: Gaye Pollack, MD;  Location: [facility];  Service: Thoracic;  Laterality: N/A;  . none    . THYROIDECTOMY    . TONSILLECTOMY    . VIDEO BRONCHOSCOPY  N/A 05/31/2015   Procedure: VIDEO BRONCHOSCOPY;  Surgeon: Gaye Pollack, MD;  Location: [facility] OR;  Service: Thoracic;  Laterality: N/A;    REVIEW OF SYSTEMS:  A comprehensive review of systems was negative except for: Constitutional: positive for anorexia, fatigue and weight loss Respiratory: positive for dyspnea on exertion Musculoskeletal: positive for muscle weakness   PHYSICAL EXAMINATION: General appearance: alert, cooperative, fatigued and no distress Head: Normocephalic, without obvious abnormality, atraumatic Neck: no adenopathy, no JVD, supple, symmetrical, trachea midline and thyroid not enlarged, symmetric, no tenderness/mass/nodules Lymph nodes: Cervical, supraclavicular, and axillary nodes normal. Resp: clear to auscultation bilaterally Back: symmetric, no curvature. ROM normal. No CVA tenderness. Cardio: regular rate and rhythm, S1, S2 normal, no murmur, click, rub or gallop GI: soft, non-tender; bowel sounds normal; no masses,  no organomegaly Extremities: extremities normal,  atraumatic, no cyanosis or edema Neurologic: Alert and oriented X 3, normal strength and tone. Normal symmetric reflexes. Normal coordination and gait  ECOG PERFORMANCE STATUS: 2 - Symptomatic, <50% confined to bed  Blood pressure (!) 86/55, pulse 98, temperature 97.5 F (36.4 C), temperature source Oral, resp. rate 16, height '5\' 6"'  (1.676 m), weight 132 lb 3.2 oz (60 kg), SpO2 99 %.  LABORATORY DATA: Lab Results  Component Value Date   WBC 2.9 (L) 03/07/2016   HGB 11.3 (L) 03/07/2016   HCT 35.0 (L) 03/07/2016   MCV 86.4 03/07/2016   PLT 372 03/07/2016      Chemistry      Component Value Date/Time   NA 137 03/07/2016 1050   K 3.3 (L) 03/07/2016 1050   CL 101 03/01/2016 1038   CO2 25 03/07/2016 1050   BUN 6.7 (L) 03/07/2016 1050   CREATININE 0.8 03/07/2016 1050      Component Value Date/Time   CALCIUM 9.5 03/07/2016 1050   ALKPHOS 70 03/07/2016 1050   AST 16 03/07/2016 1050   ALT 14 03/07/2016 1050   BILITOT 0.30 03/07/2016 1050       RADIOGRAPHIC STUDIES: Dg Chest 1 View  Result Date: 02/20/2016 CLINICAL DATA:  Status post right thoracentesis.  Cough. EXAM: CHEST 1 VIEW COMPARISON:  02/20/2016 FINDINGS: Smaller right pleural effusion. No evidence for pneumothorax. Right suprahilar mass again identified. The left lung is clear. Smaller left pleural effusion noted. No pulmonary edema. IMPRESSION: Smaller right pleural effusion.  No pneumothorax. Electronically Signed   By: Nolon Nations M.D.   On: 02/20/2016 13:11   Dg Chest 1 View  Result Date: 02/13/2016 CLINICAL DATA:  75 year old male status post thoracentesis EXAM: CHEST 1 VIEW COMPARISON:  X-ray same day, CT same day FINDINGS: Cardiomediastinal silhouette unchanged. Improved visualization of the right heart border, although the right hemidiaphragm and right cardiophrenic angle are obscured. Similar appearance of right suprahilar density. Improved opacity at the right base status post thoracentesis. Uplifting of the  minor fissure persist. Calcifications. No pneumothorax. IMPRESSION: Status post right thoracentesis without pneumothorax. Improved right-sided pleural fluid. Signed, Dulcy Fanny. Earleen Newport, DO [facility] Electronically Signed   By: Corrie Mckusick D.O.   On: 02/13/2016 12:47   Dg Chest 2 View  Result Date: 02/29/2016 CLINICAL DATA:  Lung cancer.  Shortness of breath.  Chest pain . EXAM: CHEST  2 VIEW COMPARISON:  02/20/2016.  02/13/2016.  CT 02/13/2016 . FINDINGS: Mediastinum hilar structures stable. Stable right upper lobe atelectasis and/or mass. Stable right lower lobe atelectasis. Stable right pleural effusion. Heart size stable . IMPRESSION: Chest is unchanged with stable right upper  lobe atelectasis and/or mass. Stable right lower lobe atelectasis and right pleural effusion. No acute new findings. Electronically Signed   By: Marcello Moores  Register   On: 02/29/2016 10:13   Dg Chest 2 View  Result Date: 02/20/2016 CLINICAL DATA:  Worsening shortness of breath over the past 4 days and patient with a history of lung carcinoma. EXAM: CHEST  2 VIEW COMPARISON:  PA and lateral chest, single view of the chest and CT chest 02/13/2016. FINDINGS: Moderate to moderately large right pleural effusion appears slightly increased in size since the most recent plain film. Opacity in the right hilar and suprahilar region consistent with history of lung carcinoma and post treatment change is stable in appearance. Trace left pleural effusion is seen. Heart size is normal. Aortic atherosclerosis is identified. No pneumothorax. IMPRESSION: Some increase in a moderate to moderately large right pleural effusion since the most recent examination in patient with known lung carcinoma on the right. No change in a trace left pleural effusion. Atherosclerosis. Electronically Signed   By: Inge Rise M.D.   On: 02/20/2016 10:29   Dg Chest 2 View  Result Date: 02/13/2016 CLINICAL  DATA:  75 year old male with a history of increasing cough and shortness of breath over 3 days. History of lung cancer EXAM: CHEST  2 VIEW COMPARISON:  PET-CT 12/19/2015, chest CT 12/01/2015, prior chest x-ray 05/31/2015 FINDINGS: Right-sided mediastinal and cardiac border obscured by overlying lung/ pleural disease. Opacity in the right suprahilar region involving the mediastinal border and the right upper lobe. Uplifting of the minor fissure. Dense opacity at the right base obscures the right hemidiaphragm and the right heart border. No pneumothorax. Dense opacity on the lateral view at the posterior base. Trace opacity at the posterior left base. IMPRESSION: Dense opacity at the right base, likely a combination of volume loss, atelectasis/consolidation, and pleural effusion in this patient with known carcinoma. Opacity in the right suprahilar region, potentially a combination of residual disease and/or treatment effects. Aortic atherosclerosis. Signed, Dulcy Fanny. Earleen Newport, DO [facility] Radiology Electronically Signed   By: Corrie Mckusick D.O.   On: 02/13/2016 07:17   Ct Angio Chest Pe W And/or Wo Contrast  Result Date: 02/13/2016 CLINICAL DATA:  75 year old male with a history of cough for 2 weeks EXAM: CT ANGIOGRAPHY CHEST WITH CONTRAST TECHNIQUE: Multidetector CT imaging of the chest was performed using the standard protocol during bolus administration of intravenous contrast. Multiplanar CT image reconstructions and MIPs were obtained to evaluate the vascular anatomy. CONTRAST:  100 cc Isovue 370 COMPARISON:  PET-CT 12/19/2015, CT 12/01/2015 FINDINGS: Chest: Asymmetric breast tissue with left breast tissue measuring 4.7 cm in greatest diameter. This was present on the comparison PET-CT. Bilateral axillary lymph nodes. None of these are enlarged by CT size criteria. No significant supraclavicular adenopathy. Mediastinal structures are shifted from right to left.  Central airways are patent to the carina. Bronchus intermedius patent. Proximal bronchi of the right lower lobe are partially occluded. Left-sided airways are clear. Lungs: Right:  Interval development of large right-sided pleural effusion. Lateral segment of the middle lobe demonstrates complete collapse. 50% atelectasis of the right lower lobe. Dense airspace opacity/nodular consolidation of the right upper lobe. Hypodense focus in the region of the treated tumor measures 2.8 cm x 2.4 cm. There are a few adjacent small nodules which were present on the comparison PET-CT. Left: Ground-glass opacity of the left apex more pronounced than the comparison PET-CT now measuring 11 mm. Pleural based nodule  of the left upper lobe anteriorly measures 8 mm, increased from the prior. New left lower lobe nodules (image 72), (image 75) measuring 5 mm and 4 mm. Trace left-sided pleural effusion. Vascular: Unremarkable course caliber and contour of the thoracic aorta with no aneurysm or dissection flap. Scattered calcifications. Calcifications of the branch vessels. No central, lobar, segmental pulmonary artery filling defects. Distal to the segmental vessels, study limited by the motion artifact and contrast bolus. Heart size unchanged.  Trace pericardial fluid/thickening. Musculoskeletal: Small lucent lesion of the T5 right transverse process, new from prior. Sclerotic lesion of the T10 vertebral body, similar to prior. Upper abdomen: Unremarkable appearance of the visualized upper abdomen. Review of the MIP images confirms the above findings. IMPRESSION: New large right-sided pleural effusion contributing to right to left midline shift. There is associated near complete collapse of the right lower lobe, with minimal aeration of the right upper lobe, right middle lobe, and right lower lobe. New nodular soft tissue/consolidation of the right upper lobe in the region of the previous identified right upper lobe tumor, status post  initiation of treatment. Given the time interval from the comparison CT of 35/57/3220, is uncertain to what degree these changes represent treatment treatment effects versus progression. At least some of these are favored to represent progression given the appearance of the contralateral lung and evidence of skeletal metastases. Interval enlargement of left lung nodules and development of new nodules compatible with pulmonary progression of metastatic disease. New lucent lesion of the right T5 transverse process. Re- demonstration of T10 vertebral body sclerotic lesion. Asymmetric soft tissue of the left breast, presumably gynecomastia. Trace pericardial effusion. Small left-sided pleural effusion. Aortic atherosclerosis. Signed, Dulcy Fanny. Earleen Newport, DO [facility] Radiology Electronically Signed   By: Corrie Mckusick D.O.   On: 02/13/2016 08:12   Dg Chest Port 1 View  Result Date: 03/01/2016 CLINICAL DATA:  75 year old male -status post right PleurX catheter placement. EXAM: PORTABLE CHEST 1 VIEW COMPARISON:  02/29/2016 FINDINGS: A right pleural catheter is noted overlying the medial right hemi thorax. A very small right lateral pneumothorax is noted. Right pleural effusion has decreased. Right paramedian opacity is unchanged. The left lung is clear. IMPRESSION: Right pleural catheter placement with very small right lateral pneumothorax. Decreased right pleural effusion. Unchanged right paramedian opacity. Electronically Signed   By: Margarette Canada M.D.   On: 03/01/2016 17:32   US Thoracentesis Asp Pleural Space W/img Guide  Result Date: 02/20/2016 INDICATION: History of metastatic lung cancer. Request is made for diagnostic and therapeutic thoracentesis. EXAM: ULTRASOUND GUIDED DIAGNOSTIC AND THERAPEUTIC THORACENTESIS MEDICATIONS: 1% lidocaine. COMPLICATIONS: None immediate. PROCEDURE: An ultrasound guided thoracentesis was thoroughly discussed with the patient and  questions answered. The benefits, risks, alternatives and complications were also discussed. The patient understands and wishes to proceed with the procedure. Written consent was obtained. Ultrasound was performed to localize and mark an adequate pocket of fluid in the right chest. The area was then prepped and draped in the normal sterile fashion. 1% Lidocaine was used for local anesthesia. Under ultrasound guidance a Safe-T-Centesis catheter was introduced. Thoracentesis was performed. The catheter was removed and a dressing applied. FINDINGS: A total of approximately 2.4 L of yellow fluid was removed. Samples were sent to the laboratory as requested by the clinical team. IMPRESSION: Successful ultrasound guided right thoracentesis yielding 2.4 L of pleural fluid. Read by: Saverio Danker, PA-C Electronically Signed   By: Marybelle Killings M.D.   On: 02/20/2016 13:29  US Thoracentesis Asp Pleural Space W/img Guide  Result Date: 02/13/2016 INDICATION: Patient with history of metastatic lung cancer, right greater than left pleural effusions. Request made for diagnostic and therapeutic right thoracentesis. EXAM: ULTRASOUND GUIDED DIAGNOSTIC AND THERAPEUTIC RIGHT THORACENTESIS MEDICATIONS: None. COMPLICATIONS: None immediate. PROCEDURE: An ultrasound guided thoracentesis was thoroughly discussed with the patient and questions answered. The benefits, risks, alternatives and complications were also discussed. The patient understands and wishes to proceed with the procedure. Written consent was obtained. Ultrasound was performed to localize and mark an adequate pocket of fluid in the right chest. The area was then prepped and draped in the normal sterile fashion. 1% Lidocaine was used for local anesthesia. Under ultrasound guidance a Safe-T-Centesis catheter was introduced. Thoracentesis was performed. The catheter was removed and a dressing applied. FINDINGS: A total of approximately 1.7 liters of slightly hazy, yellow  fluid was removed. Samples were sent to the laboratory as requested by the clinical team. Due to this being patient's initial thoracentesis only the above amount of fluid was removed at this time. IMPRESSION: Successful ultrasound guided diagnostic and therapeutic right thoracentesis yielding 1.7 liters of pleural fluid. Read by: Rowe Breken, PA-C Electronically Signed   By: Lucrezia Europe M.D.   On: 02/13/2016 12:45    ASSESSMENT AND PLAN: This is a very pleasant 75 years old African-American male recently diagnosed with a stage IIIA non-small cell lung cancer, adenocarcinoma with negative EGFR mutation, negative ALK gene translocation and negative ROS1. He completed a course of concurrent chemoradiation with weekly carboplatin and paclitaxel status post 5 cycles. The patient tolerated his treatment fairly well. The recent CT scan of the chest showed partial response in the primary left upper lobe neoplasm with no residual thoracic adenopathy. He was seen by Dr. Cyndia Bent for consideration of surgical resection but the patient became apprehensive about surgery after discussion of the risk and benefits of the procedure. He completed consolidation chemotherapy with carboplatin and Alimta status post 3 cycles. He tolerated his treatment fairly well. The recent CT scan of the chest showed improvement of the primary right upper lobe tumor but there was seen you solid right upper lobe pulmonary nodules suspicious for metastasis.  The recent PET scan showed reduction in the size and metabolic activity of the dominant right upper lobe pulmonary nodule as well as resolution of the metabolic activity and reduction in the size of the right paratracheal lymph node but unfortunately there was new hypermetabolic nodule within the right upper lobe concerning for a metastatic nodule in addition to a solitary hypermetabolic skeletal metastasis at the T9 vertebral body. The patient was started on treatment with immunotherapy  with Nivolumab status post 4 cycles. He tolerated the treatment well but unfortunately the recent CT scan of the chest showed significant disease progression. Several treatment options have been discussed with the patient in the past including palliative care and hospice referral versus systemic chemotherapy with docetaxel and Cyramza. He is still thinking about his options. The family requested referral to [facility] which was already requested but the patient has not received the appointment yet. For the low back pain, he is currently on Percocet. For dehydration, I will arrange for the patient to receive 1 L of normal saline today. For the depression and lack of appetite, I would start the patient on Remeron 30 mg by mouth daily at bedtime. I did not give the patient a follow-up appointment but I will wait to hear from him after [facility] evaluation at [facility]. The patient was advised to call immediately if he has any concerning symptoms in the interval. The patient voices understanding of current disease status and treatment options and is in agreement with the current care plan.  All questions were answered. The patient knows to call the clinic with any problems, questions or concerns. We can certainly see the patient much sooner if necessary.  Disclaimer: This note was dictated with voice recognition software. Similar sounding words can inadvertently be transcribed and may not be corrected upon review.

## 2016-03-07 NOTE — Patient Instructions (Signed)

## 2016-03-08 NOTE — Progress Notes (Incomplete)
°  Radiation Oncology         [phone] ________________________________  Name: Alan [name] MRN: [id number]  Date: 02/29/2016  DOB: 05-Oct-1940  End of Treatment Note   ICD-9-CM ICD-10-CM    1. 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   ***  Plan: The patient has completed radiation treatment. The patient will return to radiation oncology clinic for routine followup in one month. I advised them to call or return sooner if they have any questions or concerns related to their recovery or treatment.  -----------------------------------  Blair Promise, PhD, MD

## 2016-03-10 ENCOUNTER — Emergency Department (HOSPITAL_COMMUNITY)
Admission: EM | Admit: 2016-03-10 | Discharge: 2016-03-10 | Disposition: A | Discharge status: Home / Self Care | Payer: Medicare HMO | Attending: Emergency Medicine | Admitting: Emergency Medicine

## 2016-03-10 ENCOUNTER — Encounter (HOSPITAL_COMMUNITY): Payer: Self-pay

## 2016-03-10 DIAGNOSIS — I10 Essential (primary) hypertension: Secondary | ICD-10-CM | POA: Insufficient documentation

## 2016-03-10 DIAGNOSIS — Z87891 Personal history of nicotine dependence: Secondary | ICD-10-CM | POA: Diagnosis not present

## 2016-03-10 DIAGNOSIS — E039 Hypothyroidism, unspecified: Secondary | ICD-10-CM | POA: Insufficient documentation

## 2016-03-10 DIAGNOSIS — Z8546 Personal history of malignant neoplasm of prostate: Secondary | ICD-10-CM | POA: Insufficient documentation

## 2016-03-10 DIAGNOSIS — Z7951 Long term (current) use of inhaled steroids: Secondary | ICD-10-CM | POA: Insufficient documentation

## 2016-03-10 DIAGNOSIS — Z85118 Personal history of other malignant neoplasm of bronchus and lung: Secondary | ICD-10-CM | POA: Diagnosis not present

## 2016-03-10 DIAGNOSIS — R39198 Other difficulties with micturition: Secondary | ICD-10-CM | POA: Diagnosis not present

## 2016-03-10 DIAGNOSIS — Z719 Counseling, unspecified: Secondary | ICD-10-CM

## 2016-03-10 DIAGNOSIS — Z7982 Long term (current) use of aspirin: Secondary | ICD-10-CM | POA: Diagnosis not present

## 2016-03-10 DIAGNOSIS — Z79899 Other long term (current) drug therapy: Secondary | ICD-10-CM | POA: Diagnosis not present

## 2016-03-10 LAB — BASIC METABOLIC PANEL
ANION GAP: 11 (ref 5–15)
BUN: 8 mg/dL (ref 6–20)
CO2: 26 mmol/L (ref 22–32)
Calcium: 9.1 mg/dL (ref 8.9–10.3)
Chloride: 100 mmol/L — ABNORMAL LOW (ref 101–111)
Creatinine, Ser: 0.76 mg/dL (ref 0.61–1.24)
GLUCOSE: 85 mg/dL (ref 65–99)
POTASSIUM: 3.2 mmol/L — AB (ref 3.5–5.1)
Sodium: 137 mmol/L (ref 135–145)

## 2016-03-10 LAB — URINALYSIS, ROUTINE W REFLEX MICROSCOPIC
Glucose, UA: NEGATIVE mg/dL
HGB URINE DIPSTICK: NEGATIVE
Leukocytes, UA: NEGATIVE
NITRITE: NEGATIVE
PROTEIN: NEGATIVE mg/dL
SPECIFIC GRAVITY, URINE: 1.017 (ref 1.005–1.030)
pH: 6 (ref 5.0–8.0)

## 2016-03-10 LAB — CBC WITH DIFFERENTIAL/PLATELET
BASOS PCT: 0 %
Basophils Absolute: 0 10*3/uL (ref 0.0–0.1)
EOS ABS: 0 10*3/uL (ref 0.0–0.7)
EOS PCT: 1 %
HCT: 32.8 % — ABNORMAL LOW (ref 39.0–52.0)
HEMOGLOBIN: 10.8 g/dL — AB (ref 13.0–17.0)
Lymphocytes Relative: 17 %
Lymphs Abs: 0.5 10*3/uL — ABNORMAL LOW (ref 0.7–4.0)
MCH: 28.1 pg (ref 26.0–34.0)
MCHC: 32.9 g/dL (ref 30.0–36.0)
MCV: 85.2 fL (ref 78.0–100.0)
Monocytes Absolute: 0.3 10*3/uL (ref 0.1–1.0)
Monocytes Relative: 12 %
NEUTROS PCT: 70 %
Neutro Abs: 1.9 10*3/uL (ref 1.7–7.7)
PLATELETS: 312 10*3/uL (ref 150–400)
RBC: 3.85 MIL/uL — AB (ref 4.22–5.81)
RDW: 14.2 % (ref 11.5–15.5)
WBC: 2.7 10*3/uL — AB (ref 4.0–10.5)

## 2016-03-10 MED ORDER — OXYCODONE-ACETAMINOPHEN 5-325 MG PO TABS
1.0000 | ORAL_TABLET | Freq: Once | ORAL | Status: AC
  Administered 2016-03-10: 1 via ORAL
  Filled 2016-03-10: qty 1

## 2016-03-10 NOTE — Discharge Instructions (Signed)
You do not have a urinary tract infection today. You were not retaining any urine on your bedside ultrasound and on the bladder scanner (< 100 mL). You have normal renal function.  Please call Dr. Jethro Poling office on Monday for follow-up regarding his urinary symptoms. This still may be related to your underlying prostate issues. Continue taking Flomax. Return without fail for worsening symptoms, including fevers, significant abdominal pain, intractable vomiting, or any other symptoms concerning to you.

## 2016-03-10 NOTE — ED Triage Notes (Signed)
Pt here with bladder pain. Pt has urinary retention for past 48 hours.  Does take flomax. Never had to be cath for problems.  Pt denies fever.

## 2016-03-10 NOTE — ED Notes (Signed)
BLADDER SCAN: 100ML

## 2016-03-10 NOTE — ED Provider Notes (Signed)
[facility] DEPT Provider Note   CSN: [id number] Arrival date & time: 03/10/16  1800     History   Chief Complaint Chief Complaint  Patient presents with  . Urinary Retention    HPI Alan [name] is a 75 y.o. male.  HPI  75 year old male who presents with difficulty urinating. 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 Also has a history of BPH with prostate cancer that is currently not undergone any treatment. States that for the past 48 hours he has been having significant pain with urination and sensation of incomplete voiding. States that he has been trying to urinate every hour, only able to urinate a small amount at a time. Denies any significant abdominal pain, flank pain, nausea or vomiting, fevers or chills. Past Medical History:  Diagnosis Date  . Anxiety state, unspecified   . Benign hypertrophy of prostate   . Benign neoplasm of colon    colon polyps  . Cancer [facility])    prostate cancer  . Chronic fatigue 12/23/2015  . Constipation, chronic   . Dehydration 02/22/2016  . Diverticulosis of colon (without mention of hemorrhage)   . Dysuria 03/07/2016  . Encounter for antineoplastic chemotherapy 09/29/2015  . Encounter for antineoplastic immunotherapy 12/23/2015  . Erectile dysfunction   . GERD (gastroesophageal reflux disease)   . Glaucoma   . Hypercholesteremia   . Hypothyroidism   . Irritable bowel syndrome   . Left hip pain 10/27/2015  . Malignant pleural effusion    right  . Onychomycosis   . Radiation 06/20/15-08/02/15   right upper lung region 60 gray  . Restless legs syndrome (RLS)   . Sleep apnea    does not use cpap  . Unspecified essential hypertension     Patient Active Problem List   Diagnosis Date Noted  . Dysuria 03/07/2016  . Dehydration 02/22/2016  . Acute respiratory failure with hypoxia ([place]) 02/20/2016  . Acute hypoxemic respiratory failure ([place]) 02/13/2016  . Pleural  effusion 02/13/2016  . Bone metastasis ([place]) 02/08/2016  . Chronic fatigue 12/23/2015  . Encounter for antineoplastic immunotherapy 12/23/2015  . Left hip pain 10/27/2015  . Encounter for antineoplastic chemotherapy 09/29/2015  . Malignant neoplasm of right upper lobe of lung ([place]) 06/03/2015  . Lung mass 06/01/2015  . Constipation 04/21/2015  . 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 Gynecomastia, male 10/20/2014  . Decreased hearing 10/20/2014  . Prostate cancer ([place]) 10/20/2014  . Acute appendicitis with peritoneal abscess 11/13/2012  . CHEST WALL PAIN, ANTERIOR 05/29/2010  . OTHER CONSTIPATION 07/12/2009  . ONYCHOMYCOSIS 10/15/2008  . Anxiety state 10/15/2008  . 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 OBSTRUCTIVE SLEEP APNEA 09/21/2007  . RESTLESS LEG SYNDROME 09/21/2007  . Diverticulosis of large intestine 09/21/2007  . HYPERCHOLESTEROLEMIA 09/11/2007  . ERECTILE DYSFUNCTION 09/11/2007  . Essential hypertension 09/11/2007  . GERD 09/11/2007  . IRRITABLE BOWEL SYNDROME 09/11/2007  . BPH (benign prostatic hypertrophy) with urinary obstruction 09/11/2007  . COLONIC POLYPS 10/30/2005    Past Surgical History:  Procedure Laterality Date  . APPENDECTOMY  12/2012  . CARDIAC CATHETERIZATION  ? 2008  . CHEST TUBE INSERTION Right 03/01/2016   Procedure: INSERTION PLEURAL DRAINAGE CATHETER with doxycycline pleurodesis;  Surgeon: Gaye Pollack, MD;  Location: [facility] OR;  Service: Thoracic;  Laterality: Right;  . COLONOSCOPY    . LAPAROSCOPIC APPENDECTOMY N/A 01/27/2013   Procedure: APPENDECTOMY LAPAROSCOPIC;  Surgeon: Edward Jolly, MD;  Location: [facility] ORS;  Service: General;  Laterality: N/A;  . MEDIASTINOSCOPY N/A 05/31/2015   Procedure: MEDIASTINOSCOPY;  Surgeon: Gaye Pollack, MD;  Location: [facility] OR;  Service: Thoracic;  Laterality: N/A;  . none    . THYROIDECTOMY    . TONSILLECTOMY    . VIDEO BRONCHOSCOPY N/A 05/31/2015   Procedure: VIDEO BRONCHOSCOPY;  Surgeon: Gaye Pollack, MD;   Location: [facility] OR;  Service: Thoracic;  Laterality: N/A;       Home Medications    Prior to Admission medications   Medication Sig Start Date End Date Taking? Authorizing Provider  acetaminophen (TYLENOL) 500 MG tablet Take 1,000 mg by mouth every 4 (four) hours as needed for mild pain, moderate pain, fever or headache.     Historical Provider, MD  aspirin EC 81 MG tablet Take 81 mg by mouth daily.     Historical Provider, MD  brimonidine-timolol (COMBIGAN) 0.2-0.5 % ophthalmic solution Place 1 drop into both eyes every 12 (twelve) hours.    Historical Provider, MD  chlorpheniramine-HYDROcodone (TUSSIONEX) 10-8 MG/5ML SUER Take 5 mLs by mouth every 12 (twelve) hours as needed for cough. 02/21/16   Gery Pray, MD  chlorproMAZINE (THORAZINE) 25 MG tablet Take 1 tablet (25 mg total) by mouth 3 (three) times daily as needed for hiccoughs. Patient not taking: Reported on 03/07/2016 11/24/15   Curt Bears, MD  Cholecalciferol (VITAMIN D PO) Take 500 Units by mouth daily.    Historical Provider, MD  finasteride ([name]) 5 MG tablet TAKE 1 TABLET BY MOUTH EVERY DAY 03/01/16   [name], MD  fluticasone [facility]) 50 MCG/ACT nasal spray Place 2 sprays into both nostrils daily as needed for allergies or rhinitis. 11/17/15   [name], MD  guaiFENesin (MUCINEX) 600 MG 12 hr tablet Take 2 tablets (1,200 mg total) by mouth 2 (two) times daily. Patient not taking: Reported on 03/07/2016 02/08/16   Gery Pray, MD  lactulose [facility]) 10 GM/15ML solution Take 10 g by mouth 2 (two) times daily as needed (constipation).     Historical Provider, MD  levothyroxine (SYNTHROID, LEVOTHROID) 125 MCG tablet Take 125 mcg by mouth daily 02/22/16   Historical Provider, MD  LINZESS 290 MCG CAPS capsule TAKE ONE CAPSULE BY MOUTH ONCE DAILY Patient taking differently: Take 290 mcg by mouth once daily 03/01/16   Mauri Pole, MD  meloxicam (MOBIC) 7.5 MG tablet Take 1 tablet (7.5 mg total) by mouth daily. 11/17/15    [name], MD  mirtazapine (REMERON) 30 MG tablet Take 1 tablet (30 mg total) by mouth at bedtime. 03/07/16   Curt Bears, MD  Multiple Vitamins-Minerals (CENTRUM SILVER 50+MEN) TABS Take 1 tablet by mouth daily.    Historical Provider, MD  omeprazole (PRILOSEC) 20 MG capsule Take 1 capsule (20 mg total) by mouth every morning. 11/17/15   [name], MD  oxyCODONE-acetaminophen (PERCOCET/ROXICET) 5-325 MG tablet Take 1 tablet by mouth every 4 (four) hours as needed for severe pain. 02/22/16   Curt Bears, MD  polyethylene glycol [facility] / GLYCOLAX) packet Take 17 g by mouth daily as needed (constipation). Reported on 06/21/2015    Historical Provider, MD  prochlorperazine (COMPAZINE) 10 MG tablet Take 1 tablet (10 mg total) by mouth every 6 (six) hours as needed for nausea or vomiting. Patient not taking: Reported on 03/07/2016 06/04/15   Curt Bears, MD  senna-docusate (SENOKOT-S) 8.6-50 MG tablet Take 2 tablets by mouth 2 (two) times daily. 02/21/16   Lavina Hamman, MD  sildenafil (VIAGRA) 100 MG  tablet Take 1 tablet (100 mg total) by mouth as directed. Patient taking differently: Take 100 mg by mouth as needed for erectile dysfunction.  11/17/15   [name], MD  simvastatin (ZOCOR) 20 MG tablet Take 1 tablet (20 mg total) by mouth at bedtime. 11/17/15   [name], MD  sucralfate (CARAFATE) 1 g tablet Take 1 tablet (1 g total) by mouth 4 (four) times daily -  with meals and at bedtime. Mix in 2Tbs water, swish and swallow Patient taking differently: Take 1 g by mouth 4 (four) times daily as needed (for stomach). Mix in 2Tbs water, swish and swallow 08/01/15   Hayden Pedro, PA-C  tamsulosin (FLOMAX) 0.4 MG CAPS capsule Take 2 capsules (0.8 mg total) by mouth at bedtime. 11/17/15   [name], MD  traMADol (ULTRAM) 50 MG tablet Take 1 tablet (50 mg total) by mouth every 6 (six) hours as needed for moderate pain. 12/23/15   Curt Bears, MD    Family History Family  History  Problem Relation Age of Onset  . Emphysema Mother   . Cancer Father     prostate  . Colon cancer Paternal Grandfather 29  . Cancer Paternal Grandfather     prostate  . Cancer Brother     prostate    Social History Social History  Substance Use Topics  . Smoking status: Former Smoker    Packs/day: 2.00    Years: 15.00    Types: Cigarettes    Quit date: 07/02/1968  . Smokeless tobacco: Former Systems developer    Types: Chew, Snuff  . Alcohol use No     Allergies   Codeine   Review of Systems Review of Systems 10/14 systems reviewed and are negative other than those stated in the HPI   Physical Exam Updated Vital Signs BP 97/72 (BP Location: Left Arm)   Pulse (!) 125   Temp 97.8 F (36.6 C) (Oral)   Resp 18   SpO2 99%   Physical Exam Physical Exam  Nursing note and vitals reviewed. Constitutional: Thin and chronically ill appearing, non-toxic, and in no acute distress Head: Normocephalic and atraumatic.  Mouth/Throat: Oropharynx is clear and moist.  Neck: Normal range of motion. Neck supple.  Cardiovascular: Normal rate and regular rhythm.   Pulmonary/Chest: Effort normal and breath sounds normal.  Abdominal: Soft. There is no tenderness. There is no rebound and no guarding. No CVA tenderness. Musculoskeletal: Normal range of motion.  Neurological: Alert, no facial droop, fluent speech, moves all extremities symmetrically Skin: Skin is warm and dry.  Psychiatric: Cooperative   ED Treatments / Results  Labs (all labs ordered are listed, but only abnormal results are displayed) Labs Reviewed  URINALYSIS, ROUTINE W REFLEX MICROSCOPIC (NOT AT [facility]) - Abnormal; Notable for the following:       Result Value   Bilirubin Urine SMALL (*)    Ketones, ur >80 (*)    All other components within normal limits  CBC WITH DIFFERENTIAL/PLATELET - Abnormal; Notable for the following:    WBC 2.7 (*)    RBC 3.85 (*)    Hemoglobin 10.8 (*)    HCT 32.8 (*)    Lymphs Abs 0.5  (*)    All other components within normal limits  BASIC METABOLIC PANEL - Abnormal; Notable for the following:    Potassium 3.2 (*)    Chloride 100 (*)    All other components within normal limits  URINE CULTURE    EKG  EKG Interpretation None       Radiology No results found.  Procedures Procedures (including critical care time)  Medications Ordered in ED Medications  oxyCODONE-acetaminophen (PERCOCET/ROXICET) 5-325 MG per tablet 1 tablet (not administered)     Initial Impression / Assessment and Plan / ED Course  I have reviewed the triage vital signs and the nursing notes.  Pertinent labs & imaging results that were available during my care of the patient were reviewed by me and considered in my medical decision making (see chart for details).  Clinical Course    No signficant urine in bladder on bladder scan and bedside ultrasound. About 11 mL on my bedside ultrasound. UA without infection. No abdominal or flank pain.  Normal renal function. Remainder of blood work at his baseline. May be related to his underlying BPH/prostate cancer. He will follow-up with Dr. Gaynelle Arabian in 1 day. Strict return and follow-up instructions are reviewed. He expressed understanding of all discharge instructions, and felt comfortable to plan of care.  Final Clinical Impressions(s) / ED Diagnoses   Final diagnoses:  Difficulty urinating    New Prescriptions New Prescriptions   No medications on file     Forde Dandy, MD 03/10/16 2256

## 2016-03-12 ENCOUNTER — Telehealth: Payer: Self-pay | Admitting: *Deleted

## 2016-03-12 ENCOUNTER — Other Ambulatory Visit: Payer: Self-pay | Admitting: *Deleted

## 2016-03-12 DIAGNOSIS — C3411 Malignant neoplasm of upper lobe, right bronchus or lung: Secondary | ICD-10-CM

## 2016-03-12 DIAGNOSIS — E860 Dehydration: Secondary | ICD-10-CM

## 2016-03-12 LAB — URINE CULTURE: CULTURE: NO GROWTH

## 2016-03-12 MED ORDER — OXYCODONE-ACETAMINOPHEN 5-325 MG PO TABS: 1.0000 | ORAL_TABLET | ORAL | 0 refills | Status: DC | PRN

## 2016-03-12 NOTE — Telephone Encounter (Signed)
Received vm message from pt's wife requesting pain medication refill. She did not specify which pain medication she needed refilled for her husband. Attempted call back-no answer and unable to to leave voice mail message as vm mailbox is full.

## 2016-03-14 ENCOUNTER — Other Ambulatory Visit: Payer: Self-pay | Admitting: Surgery

## 2016-03-14 DIAGNOSIS — C3490 Malignant neoplasm of unspecified part of unspecified bronchus or lung: Secondary | ICD-10-CM

## 2016-03-15 ENCOUNTER — Ambulatory Visit (INDEPENDENT_AMBULATORY_CARE_PROVIDER_SITE_OTHER): Payer: Medicare HMO | Admitting: Surgery

## 2016-03-15 ENCOUNTER — Encounter: Payer: Self-pay | Admitting: Surgery

## 2016-03-15 ENCOUNTER — Ambulatory Visit
Admission: RE | Admit: 2016-03-15 | Discharge: 2016-03-15 | Disposition: A | Discharge status: Home / Self Care | Payer: Medicare HMO | Source: Ambulatory Visit | Attending: Surgery | Admitting: Surgery

## 2016-03-15 VITALS — BP 96/73 | HR 130 | Resp 20 | Ht 66.0 in | Wt 132.0 lb

## 2016-03-15 DIAGNOSIS — C3411 Malignant neoplasm of upper lobe, right bronchus or lung: Secondary | ICD-10-CM | POA: Diagnosis not present

## 2016-03-15 DIAGNOSIS — J948 Other specified pleural conditions: Secondary | ICD-10-CM

## 2016-03-15 DIAGNOSIS — J90 Pleural effusion, not elsewhere classified: Secondary | ICD-10-CM

## 2016-03-15 DIAGNOSIS — C3490 Malignant neoplasm of unspecified part of unspecified bronchus or lung: Secondary | ICD-10-CM

## 2016-03-15 NOTE — Progress Notes (Signed)
HPI:  The patient returns for follow up s/p insertion of a right pleurX catheter and doxycycline pleurodesis on 03/01/2016 for metastatic non-small cell lung cancer with a large malignant right pleural effusion causing significant shortness of breath. He says that his breathing was better initially but seems worse the past few days. He has some non-productive cough. No fever or chills. The catheter has drained about a teaspoon of fluid on 9/11 and nothing on 9/13.   Current Outpatient Prescriptions  Medication Sig Dispense Refill  . acetaminophen (TYLENOL) 500 MG tablet Take 1,000 mg by mouth every 4 (four) hours as needed for mild pain, moderate pain, fever or headache.     [name] aspirin EC 81 MG tablet Take 81 mg by mouth daily.     . brimonidine-timolol (COMBIGAN) 0.2-0.5 % ophthalmic solution Place 1 drop into both eyes every 12 (twelve) hours.    . chlorpheniramine-HYDROcodone (TUSSIONEX) 10-8 MG/5ML SUER Take 5 mLs by mouth every 12 (twelve) hours as needed for cough. 140 mL 0  . chlorproMAZINE (THORAZINE) 25 MG tablet Take 1 tablet (25 mg total) by mouth 3 (three) times daily as needed for hiccoughs. 30 tablet 0  . Cholecalciferol (VITAMIN D PO) Take 500 Units by mouth daily.    . finasteride ([name]) 5 MG tablet TAKE 1 TABLET BY MOUTH EVERY DAY 30 tablet 0  . fluticasone (FLONASE) 50 MCG/ACT nasal spray Place 2 sprays into both nostrils daily as needed for allergies or rhinitis. 48 g 3  . guaiFENesin (MUCINEX) 600 MG 12 hr tablet Take 2 tablets (1,200 mg total) by mouth 2 (two) times daily. 30 tablet 1  . lactulose (CHRONULAC) 10 GM/15ML solution Take 10 g by mouth 2 (two) times daily as needed (constipation).     [name] levothyroxine (SYNTHROID, LEVOTHROID) 125 MCG tablet Take 125 mcg by mouth daily  0  . LINZESS 290 MCG CAPS capsule TAKE ONE CAPSULE BY MOUTH ONCE DAILY (Patient taking differently: Take 290 mcg by mouth once daily) 30 capsule 3  . meloxicam (MOBIC) 7.5 MG tablet Take 1  tablet (7.5 mg total) by mouth daily. 30 tablet 1  . mirtazapine (REMERON) 30 MG tablet Take 1 tablet (30 mg total) by mouth at bedtime. 30 tablet 0  . Multiple Vitamins-Minerals (CENTRUM SILVER 50+MEN) TABS Take 1 tablet by mouth daily.    [name] omeprazole (PRILOSEC) 20 MG capsule Take 1 capsule (20 mg total) by mouth every morning. 90 capsule 0  . oxyCODONE-acetaminophen (PERCOCET/ROXICET) 5-325 MG tablet Take 1 tablet by mouth every 4 (four) hours as needed for severe pain. 30 tablet 0  . polyethylene glycol (MIRALAX / GLYCOLAX) packet Take 17 g by mouth daily as needed (constipation). Reported on 06/21/2015    . prochlorperazine (COMPAZINE) 10 MG tablet Take 1 tablet (10 mg total) by mouth every 6 (six) hours as needed for nausea or vomiting. 30 tablet 0  . senna-docusate (SENOKOT-S) 8.6-50 MG tablet Take 2 tablets by mouth 2 (two) times daily. 30 tablet 0  . sildenafil (VIAGRA) 100 MG tablet Take 1 tablet (100 mg total) by mouth as directed. (Patient taking differently: Take 100 mg by mouth as needed for erectile dysfunction. ) 10 tablet 0  . simvastatin (ZOCOR) 20 MG tablet Take 1 tablet (20 mg total) by mouth at bedtime. 90 tablet 3  . sucralfate (CARAFATE) 1 g tablet Take 1 tablet (1 g total) by mouth 4 (four) times daily -  with meals and at bedtime. Mix in 2Tbs  water, swish and swallow (Patient taking differently: Take 1 g by mouth 4 (four) times daily as needed (for stomach). Mix in 2Tbs water, swish and swallow) 60 tablet 0  . tamsulosin (FLOMAX) 0.4 MG CAPS capsule Take 2 capsules (0.8 mg total) by mouth at bedtime. 90 capsule 3  . traMADol (ULTRAM) 50 MG tablet Take 1 tablet (50 mg total) by mouth every 6 (six) hours as needed for moderate pain. 30 tablet 0   No current facility-administered medications for this visit.      Physical Exam: BP 96/73 (BP Location: Right Arm, Cuff Size: Small)   Pulse (!) 130   Resp 20   Ht '5\' 6"'$  (1.676 m)   Wt 132 lb (59.9 kg)   SpO2 95% Comment: RA   BMI 21.31 kg/m  He looks frail and weak. Lung exam shows decreased breath sounds at the right base. Cardiac exam shows a regular rate and rhythm with tachycardia.  Diagnostic Tests:  CLINICAL DATA:  75 year old male with shortness of breath. Right lung cancer. Recent large pleural effusions status post thoracentesis in August. Initial encounter.  EXAM: CHEST  2 VIEW  COMPARISON:  03/01/2016 and earlier.  FINDINGS: Right chest tube remains in place. Increasing lobular right lung base opacity (arrow) which appears non dependent. There is a smaller component of layering right pleural effusion suspected. No pneumothorax identified. Abnormal opacity continues about the right hilum. Mediastinal contours are stable. Visualized tracheal air column is within normal limits. Calcified aortic atherosclerosis. Of the left lung is stable and clear. No acute osseous abnormality identified.  IMPRESSION: 1. Increasing lobular opacity in the right lower lung since 03/01/2016 which could reflect loculated recurrence of pleural fluid, pneumonia, or less likely progression of tumor. 2. Right chest tube remains in place. Small residual layering right pleural effusion. No pneumothorax.   Electronically Signed   By: Genevie Ann M.D.   On: 03/15/2016 15:13   Impression:  The pleurX catheter is no longer draining. His CXR looks worse today than it did after initial drainage. There is more density on the right and the nodular areas could be loculated fluid but I am more suspicious about progression of his cancer. I don't see any significant effusion on the lateral film. He certainly seems worse overall and is not eating or drinking much. He remains tachycardic with relatively low BP and may be getting dehydrated. He has an appt Monday at the [facility] for another opinion. I asked them to continue draining the catheter every other day and I will see him back in one week to reevaluate it.  If there is no drainage then it can be removed.  Plan:  Return to see me in one week with a CXR.   Gaye Pollack, MD [facility] [phone]

## 2016-03-16 ENCOUNTER — Encounter: Payer: Self-pay | Admitting: *Deleted

## 2016-03-16 NOTE — Progress Notes (Signed)
[facility] Psychosocial Distress Screening Clinical Social Work  Clinical Social Work was referred by distress screening protocol.  The patient scored a 7 on the Psychosocial Distress Thermometer which indicates moderate distress. Clinical Social Worker contacted patient by phone to assess for distress and other psychosocial needs. Mr. Dyar shared his only concern is knowing "what is next" and "what will work".  CSW and patient briefly discussed how to cope with "the unknown".  Patient shared he is "dealing with it the best I can" and reported he does not believe his spouse is "handling it well".  CSW provided brief emotional support and strongly encouraged patient/spouse to follow up for free counseling.  Patient was appreciative of call and agreed to follow up with CSW in future as needed.  ONCBCN DISTRESS SCREENING 02/08/2016  Screening Type Initial Screening  Distress experienced in past week (1-10) 7  Practical problem type   Emotional problem type Depression;Adjusting to appearance changes  Physical Problem type Pain;Getting around;Loss of appetitie;Constipation/diarrhea    Polo Riley, MSW, LCSW, OSW-C Clinical Social Worker [facility] [phone]

## 2016-03-17 DIAGNOSIS — Z719 Counseling, unspecified: Secondary | ICD-10-CM

## 2016-03-20 ENCOUNTER — Other Ambulatory Visit: Payer: Self-pay | Admitting: Surgery

## 2016-03-20 ENCOUNTER — Other Ambulatory Visit: Payer: Self-pay | Admitting: Internal Medicine

## 2016-03-20 DIAGNOSIS — C3490 Malignant neoplasm of unspecified part of unspecified bronchus or lung: Secondary | ICD-10-CM

## 2016-03-21 ENCOUNTER — Encounter: Payer: Self-pay | Admitting: Internal Medicine

## 2016-03-21 ENCOUNTER — Telehealth: Payer: Self-pay | Admitting: Medical Oncology

## 2016-03-21 ENCOUNTER — Ambulatory Visit (HOSPITAL_BASED_OUTPATIENT_CLINIC_OR_DEPARTMENT_OTHER): Payer: Medicare HMO

## 2016-03-21 ENCOUNTER — Ambulatory Visit (HOSPITAL_BASED_OUTPATIENT_CLINIC_OR_DEPARTMENT_OTHER): Payer: Medicare HMO | Admitting: Internal Medicine

## 2016-03-21 ENCOUNTER — Encounter: Payer: Medicare HMO | Admitting: Surgery

## 2016-03-21 ENCOUNTER — Other Ambulatory Visit: Payer: Self-pay | Admitting: Medical Oncology

## 2016-03-21 ENCOUNTER — Other Ambulatory Visit (HOSPITAL_BASED_OUTPATIENT_CLINIC_OR_DEPARTMENT_OTHER): Payer: Medicare HMO

## 2016-03-21 VITALS — BP 85/57 | HR 120 | Temp 97.8°F | Resp 17 | Ht 66.0 in | Wt 127.8 lb

## 2016-03-21 DIAGNOSIS — R5382 Chronic fatigue, unspecified: Secondary | ICD-10-CM

## 2016-03-21 DIAGNOSIS — M545 Low back pain: Secondary | ICD-10-CM | POA: Diagnosis not present

## 2016-03-21 DIAGNOSIS — Z5111 Encounter for antineoplastic chemotherapy: Secondary | ICD-10-CM

## 2016-03-21 DIAGNOSIS — E860 Dehydration: Secondary | ICD-10-CM

## 2016-03-21 DIAGNOSIS — Z5112 Encounter for antineoplastic immunotherapy: Secondary | ICD-10-CM

## 2016-03-21 DIAGNOSIS — C3411 Malignant neoplasm of upper lobe, right bronchus or lung: Secondary | ICD-10-CM

## 2016-03-21 DIAGNOSIS — C7951 Secondary malignant neoplasm of bone: Secondary | ICD-10-CM

## 2016-03-21 DIAGNOSIS — R530 Neoplastic (malignant) related fatigue: Secondary | ICD-10-CM

## 2016-03-21 DIAGNOSIS — K5900 Constipation, unspecified: Secondary | ICD-10-CM

## 2016-03-21 DIAGNOSIS — F329 Major depressive disorder, single episode, unspecified: Secondary | ICD-10-CM

## 2016-03-21 DIAGNOSIS — E46 Unspecified protein-calorie malnutrition: Secondary | ICD-10-CM

## 2016-03-21 LAB — CBC WITH DIFFERENTIAL/PLATELET
BASO%: 0.4 % (ref 0.0–2.0)
BASOS ABS: 0 10*3/uL (ref 0.0–0.1)
EOS%: 1.2 % (ref 0.0–7.0)
Eosinophils Absolute: 0 10*3/uL (ref 0.0–0.5)
HCT: 36.2 % — ABNORMAL LOW (ref 38.4–49.9)
HGB: 11.7 g/dL — ABNORMAL LOW (ref 13.0–17.1)
LYMPH#: 0.7 10*3/uL — AB (ref 0.9–3.3)
LYMPH%: 27 % (ref 14.0–49.0)
MCH: 27.2 pg (ref 27.2–33.4)
MCHC: 32.3 g/dL (ref 32.0–36.0)
MCV: 84.2 fL (ref 79.3–98.0)
MONO#: 0.2 10*3/uL (ref 0.1–0.9)
MONO%: 9.8 % (ref 0.0–14.0)
NEUT%: 61.6 % (ref 39.0–75.0)
NEUTROS ABS: 1.5 10*3/uL (ref 1.5–6.5)
PLATELETS: 307 10*3/uL (ref 140–400)
RBC: 4.3 10*6/uL (ref 4.20–5.82)
RDW: 14.6 % (ref 11.0–14.6)
WBC: 2.4 10*3/uL — ABNORMAL LOW (ref 4.0–10.3)

## 2016-03-21 LAB — COMPREHENSIVE METABOLIC PANEL
ALT: 10 U/L (ref 0–55)
ANION GAP: 11 meq/L (ref 3–11)
AST: 11 U/L (ref 5–34)
Albumin: 2.4 g/dL — ABNORMAL LOW (ref 3.5–5.0)
Alkaline Phosphatase: 86 U/L (ref 40–150)
BILIRUBIN TOTAL: 0.36 mg/dL (ref 0.20–1.20)
BUN: 7.5 mg/dL (ref 7.0–26.0)
CO2: 27 meq/L (ref 22–29)
Calcium: 9.8 mg/dL (ref 8.4–10.4)
Chloride: 102 mEq/L (ref 98–109)
Creatinine: 0.8 mg/dL (ref 0.7–1.3)
Glucose: 106 mg/dl (ref 70–140)
POTASSIUM: 3.3 meq/L — AB (ref 3.5–5.1)
SODIUM: 140 meq/L (ref 136–145)
TOTAL PROTEIN: 7.1 g/dL (ref 6.4–8.3)

## 2016-03-21 LAB — TSH: TSH: 5.859 m(IU)/L — ABNORMAL HIGH (ref 0.320–4.118)

## 2016-03-21 MED ORDER — SODIUM CHLORIDE 0.9 % IV SOLN
Freq: Once | INTRAVENOUS | Status: AC
  Administered 2016-03-21: 14:00:00 via INTRAVENOUS

## 2016-03-21 MED ORDER — DRONABINOL 2.5 MG PO CAPS: 2.5000 mg | ORAL_CAPSULE | Freq: Two times a day (BID) | ORAL | 0 refills | Status: DC

## 2016-03-21 NOTE — Patient Instructions (Signed)

## 2016-03-21 NOTE — Telephone Encounter (Signed)
Referred to hospice.

## 2016-03-21 NOTE — Progress Notes (Signed)
[facility] [phone]   [phone]  OFFICE PROGRESS NOTE  [name], MD [place] [address]  DIAGNOSIS: Metastatic non-small cell lung cancer initially diagnosed as Stage IIIA (T1b, N2, M0) non-small cell lung cancer, adenocarcinoma presented with right right upper lobe nodule in addition to mediastinal lymphadenopathy diagnosed in November 2016.  MOLECULAR STUDIES (Foundation one):  Genomic Alterations Identified? KRAS G12C STK11 Q220*   PRIOR THERAPY:  1) Concurrent chemoradiation with weekly carboplatin for AUC of 2 and paclitaxel 45 MG/M2. Status post 5 cycles. Last dose was given 07/18/2015 with partial response. 2) Consolidation chemotherapy with carboplatin for AUC of 5 and Alimta 500 MG/M2 every 3 weeks. First dose 10/06/2015. Status post 3 cycles. 3)  Immunotherapy with Nivolumab 240 MG IV every 2 weeks. First dose 12/28/2015. Status post 4 cycles. Last dose was giving 02/08/2016 discontinued secondary to disease progression.  CURRENT THERAPY: None  INTERVAL HISTORY: Alan [name] 75 y.o. male returns to the clinic today for follow-up visit accompanied by his wife. The patient is complaining of increasing fatigue and weakness as well as lack of appetite and dehydration. He also continues to have pain in the hip area bilaterally. He was seen recently at [facility] for a second opinion regarding his condition and he was giving similar recommendation including hospice or further systemic therapy with docetaxel and Cyramza. The patient is not interested in pursuing any further therapy at this point. He is here today for evaluation and discussion of his treatment options.   MEDICAL HISTORY: Past Medical History:  Diagnosis Date  . Anxiety state, unspecified   . Benign hypertrophy of prostate   . Benign neoplasm of colon    colon polyps  . Cancer [facility])    prostate cancer  . Chronic fatigue 12/23/2015  .  Constipation, chronic   . Dehydration 02/22/2016  . Diverticulosis of colon (without mention of hemorrhage)   . Dysuria 03/07/2016  . Encounter for antineoplastic chemotherapy 09/29/2015  . Encounter for antineoplastic immunotherapy 12/23/2015  . Erectile dysfunction   . GERD (gastroesophageal reflux disease)   . Glaucoma   . Hypercholesteremia   . Hypothyroidism   . Irritable bowel syndrome   . Left hip pain 10/27/2015  . Malignant pleural effusion    right  . Onychomycosis   . Radiation 06/20/15-08/02/15   right upper lung region 60 gray  . Restless legs syndrome (RLS)   . Sleep apnea    does not use cpap  . Unspecified essential hypertension     ALLERGIES:  is allergic to codeine.  MEDICATIONS:  Current Outpatient Prescriptions  Medication Sig Dispense Refill  . acetaminophen (TYLENOL) 500 MG tablet Take 1,000 mg by mouth every 4 (four) hours as needed for mild pain, moderate pain, fever or headache.     [name] aspirin EC 81 MG tablet Take 81 mg by mouth daily.     . brimonidine-timolol (COMBIGAN) 0.2-0.5 % ophthalmic solution Place 1 drop into both eyes every 12 (twelve) hours.    . chlorpheniramine-HYDROcodone (TUSSIONEX) 10-8 MG/5ML SUER Take 5 mLs by mouth every 12 (twelve) hours as needed for cough. 140 mL 0  . chlorproMAZINE (THORAZINE) 25 MG tablet Take 1 tablet (25 mg total) by mouth 3 (three) times daily as needed for hiccoughs. 30 tablet 0  . Cholecalciferol (VITAMIN D PO) Take 500 Units by mouth daily.    . finasteride ([name]) 5 MG tablet TAKE 1 TABLET BY MOUTH  EVERY DAY 30 tablet 0  . fluticasone (FLONASE) 50 MCG/ACT nasal spray Place 2 sprays into both nostrils daily as needed for allergies or rhinitis. 48 g 3  . guaiFENesin (MUCINEX) 600 MG 12 hr tablet Take 2 tablets (1,200 mg total) by mouth 2 (two) times daily. 30 tablet 1  . lactulose (CHRONULAC) 10 GM/15ML solution Take 10 g by mouth 2 (two) times daily as needed (constipation).     [name] levothyroxine (SYNTHROID,  LEVOTHROID) 125 MCG tablet Take 125 mcg by mouth daily  0  . levothyroxine (SYNTHROID, LEVOTHROID) 125 MCG tablet TAKE 1 TABLET BY MOUTH DAILY 30 tablet 0  . LINZESS 290 MCG CAPS capsule TAKE ONE CAPSULE BY MOUTH ONCE DAILY (Patient taking differently: Take 290 mcg by mouth once daily) 30 capsule 3  . meloxicam (MOBIC) 7.5 MG tablet Take 1 tablet (7.5 mg total) by mouth daily. 30 tablet 1  . mirtazapine (REMERON) 30 MG tablet Take 1 tablet (30 mg total) by mouth at bedtime. 30 tablet 0  . Multiple Vitamins-Minerals (CENTRUM SILVER 50+MEN) TABS Take 1 tablet by mouth daily.    [name] omeprazole (PRILOSEC) 20 MG capsule Take 1 capsule (20 mg total) by mouth every morning. 90 capsule 0  . oxyCODONE-acetaminophen (PERCOCET/ROXICET) 5-325 MG tablet Take 1 tablet by mouth every 4 (four) hours as needed for severe pain. 30 tablet 0  . polyethylene glycol (MIRALAX / GLYCOLAX) packet Take 17 g by mouth daily as needed (constipation). Reported on 06/21/2015    . prochlorperazine (COMPAZINE) 10 MG tablet Take 1 tablet (10 mg total) by mouth every 6 (six) hours as needed for nausea or vomiting. 30 tablet 0  . senna-docusate (SENOKOT-S) 8.6-50 MG tablet Take 2 tablets by mouth 2 (two) times daily. 30 tablet 0  . sildenafil (VIAGRA) 100 MG tablet Take 1 tablet (100 mg total) by mouth as directed. (Patient taking differently: Take 100 mg by mouth as needed for erectile dysfunction. ) 10 tablet 0  . simvastatin (ZOCOR) 20 MG tablet Take 1 tablet (20 mg total) by mouth at bedtime. 90 tablet 3  . sucralfate (CARAFATE) 1 g tablet Take 1 tablet (1 g total) by mouth 4 (four) times daily -  with meals and at bedtime. Mix in 2Tbs water, swish and swallow (Patient taking differently: Take 1 g by mouth 4 (four) times daily as needed (for stomach). Mix in 2Tbs water, swish and swallow) 60 tablet 0  . tamsulosin (FLOMAX) 0.4 MG CAPS capsule Take 2 capsules (0.8 mg total) by mouth at bedtime. 90 capsule 3  . traMADol (ULTRAM) 50 MG  tablet Take 1 tablet (50 mg total) by mouth every 6 (six) hours as needed for moderate pain. 30 tablet 0   No current facility-administered medications for this visit.     SURGICAL HISTORY:  Past Surgical History:  Procedure Laterality Date  . APPENDECTOMY  12/2012  . CARDIAC CATHETERIZATION  ? 2008  . CHEST TUBE INSERTION Right 03/01/2016   Procedure: INSERTION PLEURAL DRAINAGE CATHETER with doxycycline pleurodesis;  Surgeon: Gaye Pollack, MD;  Location: [facility] OR;  Service: Thoracic;  Laterality: Right;  . COLONOSCOPY    . LAPAROSCOPIC APPENDECTOMY N/A 01/27/2013   Procedure: APPENDECTOMY LAPAROSCOPIC;  Surgeon: Edward Jolly, MD;  Location: [facility] ORS;  Service: General;  Laterality: N/A;  . MEDIASTINOSCOPY N/A 05/31/2015   Procedure: MEDIASTINOSCOPY;  Surgeon: Gaye Pollack, MD;  Location: [facility];  Service: Thoracic;  Laterality: N/A;  . none    . THYROIDECTOMY    .  TONSILLECTOMY    . VIDEO BRONCHOSCOPY N/A 05/31/2015   Procedure: VIDEO BRONCHOSCOPY;  Surgeon: Gaye Pollack, MD;  Location: [facility] OR;  Service: Thoracic;  Laterality: N/A;    REVIEW OF SYSTEMS:  Constitutional: positive for anorexia, fatigue and weight loss Eyes: negative Ears, nose, mouth, throat, and face: negative Respiratory: positive for dyspnea on exertion Cardiovascular: negative Gastrointestinal: negative Genitourinary:negative Integument/breast: negative Hematologic/lymphatic: negative Musculoskeletal:positive for back pain and bone pain Neurological: negative Behavioral/Psych: negative Endocrine: negative Allergic/Immunologic: negative   PHYSICAL EXAMINATION: General appearance: alert, cooperative, fatigued and no distress Head: Normocephalic, without obvious abnormality, atraumatic Neck: no adenopathy, no JVD, supple, symmetrical, trachea midline and thyroid not enlarged, symmetric, no tenderness/mass/nodules Lymph nodes: Cervical, supraclavicular, and axillary nodes normal. Resp: clear to  auscultation bilaterally Back: symmetric, no curvature. ROM normal. No CVA tenderness. Cardio: regular rate and rhythm, S1, S2 normal, no murmur, click, rub or gallop GI: soft, non-tender; bowel sounds normal; no masses,  no organomegaly Extremities: extremities normal, atraumatic, no cyanosis or edema Neurologic: Alert and oriented X 3, normal strength and tone. Normal symmetric reflexes. Normal coordination and gait  ECOG PERFORMANCE STATUS: 2 - Symptomatic, <50% confined to bed  Blood pressure (!) 85/57, pulse (!) 120, temperature 97.8 F (36.6 C), temperature source Oral, resp. rate 17, height '5\' 6"'  (1.676 m), weight 127 lb 12.8 oz (58 kg), SpO2 97 %.  LABORATORY DATA: Lab Results  Component Value Date   WBC 2.4 (L) 03/21/2016   HGB 11.7 (L) 03/21/2016   HCT 36.2 (L) 03/21/2016   MCV 84.2 03/21/2016   PLT 307 03/21/2016      Chemistry      Component Value Date/Time   NA 140 03/21/2016 1139   K 3.3 (L) 03/21/2016 1139   CL 100 (L) 03/10/2016 2110   CO2 27 03/21/2016 1139   BUN 7.5 03/21/2016 1139   CREATININE 0.8 03/21/2016 1139      Component Value Date/Time   CALCIUM 9.8 03/21/2016 1139   ALKPHOS 86 03/21/2016 1139   AST 11 03/21/2016 1139   ALT 10 03/21/2016 1139   BILITOT 0.36 03/21/2016 1139       RADIOGRAPHIC STUDIES: Dg Chest 1 View  Result Date: 02/20/2016 CLINICAL DATA:  Status post right thoracentesis.  Cough. EXAM: CHEST 1 VIEW COMPARISON:  02/20/2016 FINDINGS: Smaller right pleural effusion. No evidence for pneumothorax. Right suprahilar mass again identified. The left lung is clear. Smaller left pleural effusion noted. No pulmonary edema. IMPRESSION: Smaller right pleural effusion.  No pneumothorax. Electronically Signed   By: Nolon Nations M.D.   On: 02/20/2016 13:11   Dg Chest 2 View  Result Date: 03/15/2016 CLINICAL DATA:  75 year old male with shortness of breath. Right lung cancer. Recent large pleural effusions status post thoracentesis in  August. Initial encounter. EXAM: CHEST  2 VIEW COMPARISON:  03/01/2016 and earlier. FINDINGS: Right chest tube remains in place. Increasing lobular right lung base opacity (arrow) which appears non dependent. There is a smaller component of layering right pleural effusion suspected. No pneumothorax identified. Abnormal opacity continues about the right hilum. Mediastinal contours are stable. Visualized tracheal air column is within normal limits. Calcified aortic atherosclerosis. Of the left lung is stable and clear. No acute osseous abnormality identified. IMPRESSION: 1. Increasing lobular opacity in the right lower lung since 03/01/2016 which could reflect loculated recurrence of pleural fluid, pneumonia, or less likely progression of tumor. 2. Right chest tube remains in place. Small residual layering right pleural effusion. No pneumothorax. Electronically Signed  By: Genevie Ann M.D.   On: 03/15/2016 15:13   Dg Chest 2 View  Result Date: 02/29/2016 CLINICAL DATA:  Lung cancer.  Shortness of breath.  Chest pain . EXAM: CHEST  2 VIEW COMPARISON:  02/20/2016.  02/13/2016.  CT 02/13/2016 . FINDINGS: Mediastinum hilar structures stable. Stable right upper lobe atelectasis and/or mass. Stable right lower lobe atelectasis. Stable right pleural effusion. Heart size stable . IMPRESSION: Chest is unchanged with stable right upper lobe atelectasis and/or mass. Stable right lower lobe atelectasis and right pleural effusion. No acute new findings. Electronically Signed   By: Marcello Moores  Register   On: 02/29/2016 10:13   Dg Chest Port 1 View  Result Date: 03/01/2016 CLINICAL DATA:  75 year old male -status post right PleurX catheter placement. EXAM: PORTABLE CHEST 1 VIEW COMPARISON:  02/29/2016 FINDINGS: A right pleural catheter is noted overlying the medial right hemi thorax. A very small right lateral pneumothorax is noted. Right pleural effusion has decreased. Right paramedian opacity is unchanged. The left lung is clear.  IMPRESSION: Right pleural catheter placement with very small right lateral pneumothorax. Decreased right pleural effusion. Unchanged right paramedian opacity. Electronically Signed   By: Margarette Canada M.D.   On: 03/01/2016 17:32   Dg C-arm 1-60 Min-no Report  Result Date: 03/12/2016 Fluoroscopy was utilized by the requesting physician.  No radiographic interpretation.   US Thoracentesis Asp Pleural Space W/img Guide  Result Date: 02/20/2016 INDICATION: History of metastatic lung cancer. Request is made for diagnostic and therapeutic thoracentesis. EXAM: ULTRASOUND GUIDED DIAGNOSTIC AND THERAPEUTIC THORACENTESIS MEDICATIONS: 1% lidocaine. COMPLICATIONS: None immediate. PROCEDURE: An ultrasound guided thoracentesis was thoroughly discussed with the patient and questions answered. The benefits, risks, alternatives and complications were also discussed. The patient understands and wishes to proceed with the procedure. Written consent was obtained. Ultrasound was performed to localize and mark an adequate pocket of fluid in the right chest. The area was then prepped and draped in the normal sterile fashion. 1% Lidocaine was used for local anesthesia. Under ultrasound guidance a Safe-T-Centesis catheter was introduced. Thoracentesis was performed. The catheter was removed and a dressing applied. FINDINGS: A total of approximately 2.4 L of yellow fluid was removed. Samples were sent to the laboratory as requested by the clinical team. IMPRESSION: Successful ultrasound guided right thoracentesis yielding 2.4 L of pleural fluid. Read by: Saverio Danker, PA-C Electronically Signed   By: Marybelle Killings M.D.   On: 02/20/2016 13:29    ASSESSMENT AND PLAN: This is a very pleasant 75 years old African-American male recently diagnosed with a stage IIIA non-small cell lung cancer, adenocarcinoma with negative EGFR mutation, negative ALK gene translocation and negative ROS1. He completed a course of concurrent chemoradiation  with weekly carboplatin and paclitaxel status post 5 cycles. The patient tolerated his treatment fairly well. The recent CT scan of the chest showed partial response in the primary left upper lobe neoplasm with no residual thoracic adenopathy. He was seen by Dr. Cyndia Bent for consideration of surgical resection but the patient became apprehensive about surgery after discussion of the risk and benefits of the procedure. He completed consolidation chemotherapy with carboplatin and Alimta status post 3 cycles. He tolerated his treatment fairly well. The recent CT scan of the chest showed improvement of the primary right upper lobe tumor but there was seen you solid right upper lobe pulmonary nodules suspicious for metastasis.  The recent PET scan showed reduction in the size and metabolic activity of the dominant right upper lobe pulmonary nodule as  well as resolution of the metabolic activity and reduction in the size of the right paratracheal lymph node but unfortunately there was new hypermetabolic nodule within the right upper lobe concerning for a metastatic nodule in addition to a solitary hypermetabolic skeletal metastasis at the T9 vertebral body. The patient was started on treatment with immunotherapy with Nivolumab status post 4 cycles. He tolerated the treatment well but unfortunately the recent CT scan of the chest showed significant disease progression. He was seen at [facility] in giving similar recommendation. I had a lengthy discussion with the patient about his condition and give him the option again of palliative care and hospice versus proceeding with systemic chemotherapy with docetaxel and Cyramza. He is not interested in proceeding with chemotherapy at this point. I will consult the palliative and hospice care of [place] to see the patient for evaluation. For the low back pain, he is currently on Percocet. For dehydration, I will arrange for the patient to receive 1 L of  normal saline today. For the depression and lack of appetite, I would start the patient on Remeron 30 mg by mouth daily at bedtime. I also gave him prescription for Marinol 2.5 mg by mouth twice a day. I will see him on as needed basis at this point. The patient was advised to call immediately if he has any concerning symptoms in the interval. The patient voices understanding of current disease status and treatment options and is in agreement with the current care plan.  All questions were answered. The patient knows to call the clinic with any problems, questions or concerns. We can certainly see the patient much sooner if necessary.  Disclaimer: This note was dictated with voice recognition software. Similar sounding words can inadvertently be transcribed and may not be corrected upon review.

## 2016-03-23 ENCOUNTER — Telehealth: Payer: Self-pay | Admitting: Medical Oncology

## 2016-03-23 DIAGNOSIS — R630 Anorexia: Secondary | ICD-10-CM

## 2016-03-23 MED ORDER — DRONABINOL 2.5 MG PO CAPS: 2.5000 mg | ORAL_CAPSULE | Freq: Two times a day (BID) | ORAL | 0 refills | Status: AC

## 2016-03-23 NOTE — Telephone Encounter (Signed)
Wife stated insurance not covering marinol-may  need prior auth for coverage . Note to [employer]

## 2016-03-26 ENCOUNTER — Other Ambulatory Visit: Payer: Self-pay | Admitting: Pulmonary Disease

## 2016-03-26 NOTE — Telephone Encounter (Signed)
Patient wife called today wanting an update on the auth for this medication.

## 2016-03-27 ENCOUNTER — Telehealth: Payer: Self-pay | Admitting: Internal Medicine

## 2016-03-27 ENCOUNTER — Telehealth: Payer: Self-pay | Admitting: *Deleted

## 2016-03-27 MED ORDER — PREDNISONE 10 MG PO TABS: 10.0000 mg | ORAL_TABLET | Freq: Every day | ORAL | 0 refills | Status: DC

## 2016-03-27 NOTE — Telephone Encounter (Signed)
-----   Message from [place], [place] sent at 03/27/2016  2:30 PM EDT ----- Regarding: Hospice call Received call from [place] from [place] regarding  Patient: Alan [name]. She was transferred to voicemail, She stated she wanted you to call her back @ [phone]

## 2016-03-27 NOTE — Telephone Encounter (Signed)
Hospice RN called with request for pt to have prednison. Reviewed with MD, VO for Prednisone '10mg'$  1 PO Daily.Rx sent to pt pharmacy. No further concerns.

## 2016-03-27 NOTE — Telephone Encounter (Signed)
Per 9/20 los cxd 10/4 appointments. No further appointments on schedule. No able to reach patient at home number or leave message.

## 2016-03-28 ENCOUNTER — Other Ambulatory Visit: Payer: Self-pay | Admitting: Surgery

## 2016-03-28 ENCOUNTER — Telehealth: Payer: Self-pay | Admitting: Internal Medicine

## 2016-03-28 ENCOUNTER — Telehealth: Payer: Self-pay | Admitting: *Deleted

## 2016-03-28 DIAGNOSIS — C3411 Malignant neoplasm of upper lobe, right bronchus or lung: Secondary | ICD-10-CM

## 2016-03-28 DIAGNOSIS — C3490 Malignant neoplasm of unspecified part of unspecified bronchus or lung: Secondary | ICD-10-CM

## 2016-03-28 DIAGNOSIS — E860 Dehydration: Secondary | ICD-10-CM

## 2016-03-28 MED ORDER — OXYCODONE-ACETAMINOPHEN 5-325 MG PO TABS: 1.0000 | ORAL_TABLET | ORAL | 0 refills | Status: DC | PRN

## 2016-03-28 NOTE — Telephone Encounter (Signed)
Faxed records to [facility] healthcare [phone]

## 2016-03-28 NOTE — Telephone Encounter (Signed)
Call from Hospice RN requesting refill on pain medicaiton. Fax to pt pharmacy

## 2016-03-29 ENCOUNTER — Ambulatory Visit (INDEPENDENT_AMBULATORY_CARE_PROVIDER_SITE_OTHER): Payer: Medicare HMO | Admitting: Surgery

## 2016-03-29 ENCOUNTER — Other Ambulatory Visit: Payer: Self-pay | Admitting: *Deleted

## 2016-03-29 ENCOUNTER — Encounter: Payer: Self-pay | Admitting: Surgery

## 2016-03-29 ENCOUNTER — Ambulatory Visit
Admission: RE | Admit: 2016-03-29 | Discharge: 2016-03-29 | Disposition: A | Discharge status: Home / Self Care | Payer: Medicare HMO | Source: Ambulatory Visit | Attending: Surgery | Admitting: Surgery

## 2016-03-29 VITALS — BP 92/61 | HR 135 | Resp 16 | Ht 66.0 in | Wt 127.0 lb

## 2016-03-29 DIAGNOSIS — Z5189 Encounter for other specified aftercare: Secondary | ICD-10-CM | POA: Diagnosis not present

## 2016-03-29 DIAGNOSIS — C3490 Malignant neoplasm of unspecified part of unspecified bronchus or lung: Secondary | ICD-10-CM

## 2016-03-29 DIAGNOSIS — J948 Other specified pleural conditions: Secondary | ICD-10-CM | POA: Diagnosis not present

## 2016-03-29 DIAGNOSIS — Z9221 Personal history of antineoplastic chemotherapy: Secondary | ICD-10-CM

## 2016-03-29 DIAGNOSIS — J90 Pleural effusion, not elsewhere classified: Secondary | ICD-10-CM

## 2016-03-29 DIAGNOSIS — C3411 Malignant neoplasm of upper lobe, right bronchus or lung: Secondary | ICD-10-CM | POA: Diagnosis not present

## 2016-03-29 DIAGNOSIS — Z923 Personal history of irradiation: Secondary | ICD-10-CM

## 2016-03-29 NOTE — Progress Notes (Signed)
HPI:  The patient returns today for follow up of his right PleurX catheter. It has not drained anything since 9/22. He is feeling fair. He is breathing well but still not much appetite and loosing about 5 lbs per week. He has decided not stop further therapy and enter hospice care.  Current Outpatient Prescriptions  Medication Sig Dispense Refill  . acetaminophen (TYLENOL) 500 MG tablet Take 1,000 mg by mouth every 4 (four) hours as needed for mild pain, moderate pain, fever or headache.     [name] aspirin EC 81 MG tablet Take 81 mg by mouth daily.     . brimonidine-timolol (COMBIGAN) 0.2-0.5 % ophthalmic solution Place 1 drop into both eyes every 12 (twelve) hours.    . chlorpheniramine-HYDROcodone (TUSSIONEX) 10-8 MG/5ML SUER Take 5 mLs by mouth every 12 (twelve) hours as needed for cough. 140 mL 0  . chlorproMAZINE (THORAZINE) 25 MG tablet Take 1 tablet (25 mg total) by mouth 3 (three) times daily as needed for hiccoughs. 30 tablet 0  . Cholecalciferol (VITAMIN D PO) Take 500 Units by mouth daily.    [name] dronabinol (MARINOL) 2.5 MG capsule Take 1 capsule (2.5 mg total) by mouth 2 (two) times daily before a meal. for appetite 60 capsule 0  . finasteride ([name]) 5 MG tablet TAKE 1 TABLET BY MOUTH EVERY DAY 30 tablet 0  . fluticasone (FLONASE) 50 MCG/ACT nasal spray Place 2 sprays into both nostrils daily as needed for allergies or rhinitis. 48 g 3  . guaiFENesin (MUCINEX) 600 MG 12 hr tablet Take 2 tablets (1,200 mg total) by mouth 2 (two) times daily. 30 tablet 1  . lactulose (CHRONULAC) 10 GM/15ML solution Take 10 g by mouth 2 (two) times daily as needed (constipation).     [name] levothyroxine (SYNTHROID, LEVOTHROID) 125 MCG tablet Take 125 mcg by mouth daily  0  . levothyroxine (SYNTHROID, LEVOTHROID) 125 MCG tablet TAKE 1 TABLET BY MOUTH DAILY 30 tablet 0  . LINZESS 290 MCG CAPS capsule TAKE ONE CAPSULE BY MOUTH ONCE DAILY (Patient taking differently: Take 290 mcg by mouth once daily) 30  capsule 3  . meloxicam (MOBIC) 7.5 MG tablet Take 1 tablet (7.5 mg total) by mouth daily. 30 tablet 1  . mirtazapine (REMERON) 30 MG tablet Take 1 tablet (30 mg total) by mouth at bedtime. 30 tablet 0  . Multiple Vitamins-Minerals (CENTRUM SILVER 50+MEN) TABS Take 1 tablet by mouth daily.    [name] omeprazole (PRILOSEC) 20 MG capsule Take 1 capsule (20 mg total) by mouth every morning. 90 capsule 0  . oxyCODONE-acetaminophen (PERCOCET/ROXICET) 5-325 MG tablet Take 1 tablet by mouth every 4 (four) hours as needed for severe pain. 30 tablet 0  . predniSONE (DELTASONE) 10 MG tablet Take 1 tablet (10 mg total) by mouth daily with breakfast. 15 tablet 0  . prochlorperazine (COMPAZINE) 10 MG tablet Take 1 tablet (10 mg total) by mouth every 6 (six) hours as needed for nausea or vomiting. 30 tablet 0  . senna-docusate (SENOKOT-S) 8.6-50 MG tablet Take 2 tablets by mouth 2 (two) times daily. 30 tablet 0  . simvastatin (ZOCOR) 20 MG tablet TAKE 1 TABLET BY MOUTH AT BEDTIME 90 tablet 0  . sucralfate (CARAFATE) 1 g tablet Take 1 tablet (1 g total) by mouth 4 (four) times daily -  with meals and at bedtime. Mix in 2Tbs water, swish and swallow (Patient taking differently: Take 1 g by mouth 4 (four) times daily as needed (for stomach).  Mix in 2Tbs water, swish and swallow) 60 tablet 0  . tamsulosin (FLOMAX) 0.4 MG CAPS capsule Take 2 capsules (0.8 mg total) by mouth at bedtime. 90 capsule 3  . traMADol (ULTRAM) 50 MG tablet Take 1 tablet (50 mg total) by mouth every 6 (six) hours as needed for moderate pain. 30 tablet 0  . polyethylene glycol (MIRALAX / GLYCOLAX) packet Take 17 g by mouth daily as needed (constipation). Reported on 06/21/2015    . sildenafil (VIAGRA) 100 MG tablet Take 1 tablet (100 mg total) by mouth as directed. (Patient not taking: Reported on 03/29/2016) 10 tablet 0   No current facility-administered medications for this visit.      Physical Exam: BP 92/61 (BP Location: Right Arm, Cuff Size:  Normal)   Pulse (!) 135   Resp 16   Ht '5\' 6"'$  (1.676 m)   Wt 127 lb (57.6 kg)   SpO2 97% Comment: ON RA  BMI 20.50 kg/m  Thin and frail, walking with a rolator. Lungs clear Heart RRR  Diagnostic Tests:  CLINICAL DATA:  Followup right-sided chest tube.  EXAM: CHEST  2 VIEW  COMPARISON:  03/15/2016  FINDINGS: Stable density in the right paramediastinal lung most consistent with radiation change. The right-sided chest tube is stable. No pneumothorax or recurrent pleural effusion. Much improved right lung aeration when compared to prior chest x-ray. The left lung remains clear. The heart is normal in size. Stable tortuosity and calcification of the thoracic aorta.  IMPRESSION: Right-sided pleural drainage catheter in place. No recurrent pleural effusion.  Stable opacification in the right paramediastinal lung most likely combination of treated tumor and radiation.   Electronically Signed   By: Marijo Sanes M.D.   On: 03/29/2016 16:20   Impression:  The PleurX catheter is not draining and the CXR shows no significant pleural effusion so I think it is time to remove the catheter.  Plan:  We will arrange to remove his catheter in Short Stay next week.   Gaye Pollack, MD [facility] [phone]

## 2016-04-02 ENCOUNTER — Telehealth: Payer: Self-pay | Admitting: Medical Oncology

## 2016-04-02 DIAGNOSIS — E860 Dehydration: Secondary | ICD-10-CM

## 2016-04-02 DIAGNOSIS — R066 Hiccough: Secondary | ICD-10-CM

## 2016-04-02 DIAGNOSIS — C3490 Malignant neoplasm of unspecified part of unspecified bronchus or lung: Secondary | ICD-10-CM

## 2016-04-02 DIAGNOSIS — C3411 Malignant neoplasm of upper lobe, right bronchus or lung: Secondary | ICD-10-CM

## 2016-04-02 MED ORDER — CHLORPROMAZINE HCL 25 MG PO TABS: 25.0000 mg | ORAL_TABLET | Freq: Three times a day (TID) | ORAL | 0 refills | Status: DC | PRN

## 2016-04-02 MED ORDER — SENNOSIDES-DOCUSATE SODIUM 8.6-50 MG PO TABS: 2.0000 | ORAL_TABLET | Freq: Two times a day (BID) | ORAL | 0 refills | Status: DC

## 2016-04-02 MED ORDER — MIRTAZAPINE 30 MG PO TABS: 30.0000 mg | ORAL_TABLET | Freq: Every day | ORAL | 0 refills | Status: AC

## 2016-04-02 MED ORDER — GUAIFENESIN ER 600 MG PO TB12: 1200.0000 mg | ORAL_TABLET | Freq: Two times a day (BID) | ORAL | 1 refills | Status: DC

## 2016-04-02 NOTE — Telephone Encounter (Signed)
Med reconciliation

## 2016-04-03 ENCOUNTER — Encounter: Payer: Medicare HMO | Admitting: Surgery

## 2016-04-04 ENCOUNTER — Ambulatory Visit (HOSPITAL_COMMUNITY)
Admission: RE | Admit: 2016-04-04 | Discharge: 2016-04-04 | Disposition: A | Discharge status: Home / Self Care | Source: Ambulatory Visit | Attending: Surgery | Admitting: Surgery

## 2016-04-04 ENCOUNTER — Encounter (HOSPITAL_COMMUNITY)
Admission: RE | Disposition: A | Discharge status: Home / Self Care | Payer: Self-pay | Source: Ambulatory Visit | Attending: Surgery

## 2016-04-04 ENCOUNTER — Ambulatory Visit: Payer: Medicare HMO

## 2016-04-04 ENCOUNTER — Ambulatory Visit: Payer: Medicare HMO | Admitting: Internal Medicine

## 2016-04-04 ENCOUNTER — Other Ambulatory Visit: Payer: Medicare HMO

## 2016-04-04 DIAGNOSIS — C3490 Malignant neoplasm of unspecified part of unspecified bronchus or lung: Secondary | ICD-10-CM | POA: Diagnosis not present

## 2016-04-04 DIAGNOSIS — J910 Malignant pleural effusion: Secondary | ICD-10-CM | POA: Diagnosis not present

## 2016-04-04 DIAGNOSIS — Z4803 Encounter for change or removal of drains: Secondary | ICD-10-CM | POA: Diagnosis present

## 2016-04-04 DIAGNOSIS — J90 Pleural effusion, not elsewhere classified: Secondary | ICD-10-CM | POA: Diagnosis not present

## 2016-04-04 HISTORY — PX: REMOVAL OF PLEURAL DRAINAGE CATHETER: SHX5080

## 2016-04-04 SURGERY — REMOVAL, CLOSED DRAINAGE CATHETER SYSTEM, PLEURAL
Anesthesia: Monitor Anesthesia Care | Laterality: Right

## 2016-04-04 MED ORDER — LIDOCAINE HCL (PF) 1 % IJ SOLN
INTRAMUSCULAR | Status: AC
  Filled 2016-04-04: qty 5

## 2016-04-04 MED ORDER — LIDOCAINE HCL 2 % IJ SOLN
INTRAMUSCULAR | Status: AC
  Filled 2016-04-04: qty 20

## 2016-04-04 NOTE — Procedures (Signed)
Pleur-x Catheter Removal   REGINO FOURNET [id number] [birth year]   Pre Procedure Diagnosis:  Malignant Right Pleural Effusion Post Procedure Diagnosis: Malignant Right Pleural Effusion   Procedure:  Time out was performed.  Right side of patient's chest was prepped and draped in sterile fashion.  The area was anesthesized with local anesthesia with 15m of 1% Lidocaine without epineprhine.  The cuff was identified and blunt dissection of adhesion around cuff was performed.  Once cuff was free the catheter was removed without difficulty.  The wound was cleaned and closed with a single 3-0 nylon interrupted suture.  There was minimal blood loss.   Patient tolerated the procedure without difficulty and he was discharged home in stable condition.

## 2016-04-05 ENCOUNTER — Ambulatory Visit
Admission: RE | Admit: 2016-04-05 | Discharge: 2016-04-05 | Disposition: A | Discharge status: Home / Self Care | Source: Ambulatory Visit | Attending: Radiation Oncology | Admitting: Radiation Oncology

## 2016-04-05 ENCOUNTER — Other Ambulatory Visit: Payer: Self-pay | Admitting: Pulmonary Disease

## 2016-04-05 ENCOUNTER — Encounter: Payer: Self-pay | Admitting: Radiation Oncology

## 2016-04-05 VITALS — BP 108/79 | HR 124 | Ht 66.0 in | Wt 125.4 lb

## 2016-04-05 DIAGNOSIS — K5900 Constipation, unspecified: Secondary | ICD-10-CM | POA: Diagnosis not present

## 2016-04-05 DIAGNOSIS — R300 Dysuria: Secondary | ICD-10-CM | POA: Insufficient documentation

## 2016-04-05 DIAGNOSIS — Z923 Personal history of irradiation: Secondary | ICD-10-CM | POA: Diagnosis not present

## 2016-04-05 DIAGNOSIS — R590 Localized enlarged lymph nodes: Secondary | ICD-10-CM | POA: Diagnosis not present

## 2016-04-05 DIAGNOSIS — C7951 Secondary malignant neoplasm of bone: Secondary | ICD-10-CM | POA: Insufficient documentation

## 2016-04-05 DIAGNOSIS — Z885 Allergy status to narcotic agent status: Secondary | ICD-10-CM | POA: Insufficient documentation

## 2016-04-05 DIAGNOSIS — J90 Pleural effusion, not elsewhere classified: Secondary | ICD-10-CM | POA: Diagnosis not present

## 2016-04-05 DIAGNOSIS — Z7982 Long term (current) use of aspirin: Secondary | ICD-10-CM | POA: Diagnosis not present

## 2016-04-05 DIAGNOSIS — C7801 Secondary malignant neoplasm of right lung: Secondary | ICD-10-CM | POA: Insufficient documentation

## 2016-04-05 DIAGNOSIS — C3491 Malignant neoplasm of unspecified part of right bronchus or lung: Secondary | ICD-10-CM | POA: Diagnosis not present

## 2016-04-05 NOTE — Progress Notes (Signed)
Alan [name] here for follow up after treatment to his right pelvis.  He reports he does not have pain in his right pelvis.  He said the pain has moved to the other side.  He reports taking 3 percocet tablets per day.  He reports he is walking better and is using a walker for balance.  He is taking prednisone to help with walking.  He reports have a poor energy level.  He reported having dysuria with radiation and still has it off and on.  He reports having constipation.  He reports that he is under hospice care now.  BP 108/79 (BP Location: Right Arm, Patient Position: Sitting)   Pulse (!) 124   Ht '5\' 6"'$  (1.676 m)   Wt 125 lb 6.4 oz (56.9 kg)   SpO2 100%   BMI 20.24 kg/m    Wt Readings from Last 3 Encounters:  04/05/16 125 lb 6.4 oz (56.9 kg)  04/04/16 127 lb (57.6 kg)  03/29/16 127 lb (57.6 kg)

## 2016-04-05 NOTE — Progress Notes (Signed)
Radiation Oncology         [phone] ________________________________  Name: Alan [name] MRN: [id number]  Date: 04/05/2016  DOB: January 08, 1941  Follow-Up Visit Note  CC: [name], MD  Curt Bears, MD    ICD-9-CM ICD-10-CM   1. 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  Interval Since Last Radiation:  1 month, 1 week completed 30 Gy on 02/29/16 to his right pelvis  Narrative:  The patient returns today for routine follow-up after treatment to his right pelvis. He reports he does not have pain in his right pelivs. He said the pain has moved to the other side. He reports taking 3 percocet tablets per day. He reports he is walking  better and is using a walker for balance. He is taking prednisone to help with walking. He reports having a poor energy level. He reports episodes of coughing. He reported having dysuria with radiation and still has it off and on. He reports having constipation. Additionally, he is under hospice care now.                              ALLERGIES:  is allergic to codeine.  Meds: Current Outpatient Prescriptions  Medication Sig Dispense Refill  . acetaminophen (TYLENOL) 500 MG tablet Take 1,000 mg by mouth every 4 (four) hours as needed for mild pain, moderate pain, fever or headache.     [name] aspirin EC 81 MG tablet Take 81 mg by mouth daily.     . brimonidine-timolol (COMBIGAN) 0.2-0.5 % ophthalmic solution Place 1 drop into both eyes every 12 (twelve) hours.    . chlorpheniramine-HYDROcodone (TUSSIONEX) 10-8 MG/5ML SUER Take 5 mLs by mouth every 12 (twelve) hours as needed for cough. 140 mL 0  . chlorproMAZINE (THORAZINE) 25 MG tablet Take 1 tablet (25 mg total) by mouth 3  (three) times daily as needed for hiccoughs. 30 tablet 0  . Cholecalciferol (VITAMIN D PO) Take 500 Units by mouth daily.    [name] dronabinol (MARINOL) 2.5 MG capsule Take 1 capsule (2.5 mg total) by mouth 2 (two) times daily before a meal. for appetite 60 capsule 0  . finasteride ([name]) 5 MG tablet TAKE 1 TABLET BY MOUTH EVERY DAY 30 tablet 0  . fluticasone (FLONASE) 50 MCG/ACT nasal spray Place 2 sprays into both nostrils daily as needed for allergies or rhinitis. 48 g 3  . lactulose (CHRONULAC) 10 GM/15ML solution Take 10 g by mouth 2 (two) times daily as needed (constipation).     [name] levothyroxine (SYNTHROID, LEVOTHROID) 125 MCG tablet TAKE 1 TABLET BY MOUTH DAILY 30 tablet 0  . LINZESS 290 MCG CAPS capsule TAKE ONE CAPSULE BY MOUTH ONCE DAILY (Patient taking differently: Take 290 mcg by mouth once daily) 30 capsule 3  . meloxicam (MOBIC) 7.5 MG tablet Take 1 tablet (7.5 mg total) by mouth daily. 30 tablet 1  . mirtazapine (REMERON) 30 MG tablet Take 1 tablet (30 mg total) by mouth at bedtime. 30 tablet 0  . Multiple Vitamins-Minerals (CENTRUM SILVER 50+MEN) TABS Take 1 tablet by mouth daily.    [name] omeprazole (PRILOSEC) 20 MG capsule Take 1 capsule (20 mg total) by mouth every morning. [place]  capsule 0  . oxyCODONE-acetaminophen (PERCOCET/ROXICET) 5-325 MG tablet Take 1 tablet by mouth every 4 (four) hours as needed for severe pain. 30 tablet 0  . predniSONE (DELTASONE) 10 MG tablet Take 1 tablet (10 mg total) by mouth daily with breakfast. 15 tablet 0  . prochlorperazine (COMPAZINE) 10 MG tablet Take 1 tablet (10 mg total) by mouth every 6 (six) hours as needed for nausea or vomiting. 30 tablet 0  . senna-docusate (SENOKOT-S) 8.6-50 MG tablet Take 2 tablets by mouth 2 (two) times daily. 30 tablet 0  . simvastatin (ZOCOR) 20 MG tablet TAKE 1 TABLET BY MOUTH AT BEDTIME 90 tablet 0  . sucralfate (CARAFATE) 1 g tablet Take 1 tablet (1 g total) by mouth 4 (four) times daily -  with meals and at bedtime.  Mix in 2Tbs water, swish and swallow (Patient taking differently: Take 1 g by mouth 4 (four) times daily as needed (for stomach). Mix in 2Tbs water, swish and swallow) 60 tablet 0  . tamsulosin (FLOMAX) 0.4 MG CAPS capsule Take 2 capsules (0.8 mg total) by mouth at bedtime. 90 capsule 3  . traMADol (ULTRAM) 50 MG tablet Take 1 tablet (50 mg total) by mouth every 6 (six) hours as needed for moderate pain. 30 tablet 0  . guaiFENesin (MUCINEX) 600 MG 12 hr tablet Take 2 tablets (1,200 mg total) by mouth 2 (two) times daily. (Patient not taking: Reported on 04/05/2016) 30 tablet 1  . sildenafil (VIAGRA) 100 MG tablet Take 1 tablet (100 mg total) by mouth as directed. (Patient not taking: Reported on 04/05/2016) 10 tablet 0   No current facility-administered medications for this encounter.     Physical Findings: The patient is in no acute distress. Patient is alert and oriented.  height is '5\' 6"'$  (1.676 m) and weight is 125 lb 6.4 oz (56.9 kg). His blood pressure is 108/79 and his pulse is 124 (abnormal). His oxygen saturation is 100%. .  No significant changes.  Lungs are clear to auscultation bilaterally. Heart has regular rate and rhythm. No palpable cervical, supraclavicular, or axillary adenopathy. Abdomen soft, non-tender, normal bowel sounds. Good strength in the proximal muscle groups of the lower extremities.   Lab Findings: Lab Results  Component Value Date   WBC 2.4 (L) 03/21/2016   HGB 11.7 (L) 03/21/2016   HCT 36.2 (L) 03/21/2016   MCV 84.2 03/21/2016   PLT 307 03/21/2016    Radiographic Findings: Dg Chest 2 View  Result Date: 03/29/2016 CLINICAL DATA:  Followup right-sided chest tube. EXAM: CHEST  2 VIEW COMPARISON:  03/15/2016 FINDINGS: Stable density in the right paramediastinal lung most consistent with radiation change. The right-sided chest tube is stable. No pneumothorax or recurrent pleural effusion. Much improved right lung aeration when compared to prior chest x-ray. The  left lung remains clear. The heart is normal in size. Stable tortuosity and calcification of the thoracic aorta. IMPRESSION: Right-sided pleural drainage catheter in place. No recurrent pleural effusion. Stable opacification in the right paramediastinal lung most likely combination of treated tumor and radiation. Electronically Signed   By: Marijo Sanes M.D.   On: 03/29/2016 16:20   Dg Chest 2 View  Result Date: 03/15/2016 CLINICAL DATA:  75 year old male with shortness of breath. Right lung cancer. Recent large pleural effusions status post thoracentesis in August. Initial encounter. EXAM: CHEST  2 VIEW COMPARISON:  03/01/2016 and earlier. FINDINGS: Right chest tube remains in place. Increasing lobular right lung base opacity (arrow) which appears non dependent. There  is a smaller component of layering right pleural effusion suspected. No pneumothorax identified. Abnormal opacity continues about the right hilum. Mediastinal contours are stable. Visualized tracheal air column is within normal limits. Calcified aortic atherosclerosis. Of the left lung is stable and clear. No acute osseous abnormality identified. IMPRESSION: 1. Increasing lobular opacity in the right lower lung since 03/01/2016 which could reflect loculated recurrence of pleural fluid, pneumonia, or less likely progression of tumor. 2. Right chest tube remains in place. Small residual layering right pleural effusion. No pneumothorax. Electronically Signed   By: Genevie Ann M.D.   On: 03/15/2016 15:13    Impression:  Secondary metastatic neoplasm of bone. The patient is recovering from the effects of radiation. Patients pain in the right pelvis has improved with palliative radiation therapy. He is currently under care of Hospice.  Plan:  Follow up with radiation oncology prn.  ____________________________________  This document serves as a record of services personally performed by Gery Pray, MD. It was created on his behalf by Bethann Humble, a trained medical scribe. The creation of this record is based on the scribe's personal observations and the provider's statements to them. This document has been checked and approved by the attending provider.

## 2016-04-06 ENCOUNTER — Encounter (HOSPITAL_COMMUNITY): Payer: Self-pay | Admitting: Surgery

## 2016-04-06 ENCOUNTER — Telehealth: Payer: Self-pay | Admitting: Pulmonary Disease

## 2016-04-06 ENCOUNTER — Telehealth: Payer: Self-pay

## 2016-04-06 MED ORDER — OMEPRAZOLE 20 MG PO CPDR: 20.0000 mg | DELAYED_RELEASE_CAPSULE | Freq: Every morning | ORAL | 3 refills | Status: AC

## 2016-04-06 NOTE — Telephone Encounter (Signed)
Called and spoke with renee with Hospice and she is aware of refill sent in for the omeprazole for the pt.  Nothing further is needed.

## 2016-04-06 NOTE — Telephone Encounter (Signed)
Rickey Barbara RN with [facility] of [place] called to inform us that pt will be starting supplemental O2 per orders already signed. pts resting sat is 96%, when he is up and about he c/o SOB and sat goes to 90%. She will call [facility] for delivery.

## 2016-04-09 NOTE — Congregational Nurse Program (Signed)
Congregational Nurse Program Note  Date of Encounter: 03/17/2016  Past Medical History: Past Medical History:  Diagnosis Date  . Anxiety state, unspecified   . Benign hypertrophy of prostate   . Benign neoplasm of colon    colon polyps  . Cancer [facility])    prostate cancer  . Chronic fatigue 12/23/2015  . Constipation, chronic   . Dehydration 02/22/2016  . Diverticulosis of colon (without mention of hemorrhage)   . Dysuria 03/07/2016  . Encounter for antineoplastic chemotherapy 09/29/2015  . Encounter for antineoplastic immunotherapy 12/23/2015  . Erectile dysfunction   . GERD (gastroesophageal reflux disease)   . Glaucoma   . Hypercholesteremia   . Hypothyroidism   . Irritable bowel syndrome   . Left hip pain 10/27/2015  . Malignant pleural effusion    right  . Onychomycosis   . Radiation 06/20/15-08/02/15   right upper lung region 60 gray  . Restless legs syndrome (RLS)   . Sleep apnea    does not use cpap  . Unspecified essential hypertension     Encounter Details:     CNP Questionnaire - 03/17/16 2238      Patient Demographics   Is this a new or existing patient? Existing   Patient is considered a/an Not Applicable   Race African-American/Black     Patient Assistance   Location of Patient Assistance [employer]   Patient's financial/insurance status Low Income;Medicare   Uninsured Patient No   Patient referred to apply for the following financial assistance [employer] [employer] insecurities addressed Not Applicable   Transportation assistance No   Assistance securing medications No   Educational health offerings Cancer;Safety;Health literacy;Nutrition;Spiritual care;Chronic disease;Navigating the healthcare system     Encounter Details   Primary purpose of visit Education/Health Concerns;Safety;Chronic Illness/Condition Visit;Spiritual Care/Support Visit;Post PCP Visit;Navigating the Healthcare System   Was an Emergency Department visit averted? No   Does patient have a medical provider? Yes   Patient referred to Follow up with established PCP   Was a mental health screening completed? (GAINS tool) No   Does patient have dental issues? No   Does patient have vision issues? Yes   Was a vision referral made? No   Does your patient have an abnormal blood pressure today? No   Since previous encounter, have you referred patient for abnormal blood pressure that resulted in a new diagnosis or medication change? No   Does your patient have an abnormal blood glucose today? No   Since previous encounter, have you referred patient for abnormal blood glucose that resulted in a new diagnosis or medication change? No   Was there a life-saving intervention made? No     HV with client and family.  Wife really worried about his blood pressure.  BP 95/61.  Has been having low pressures.   Recommend fluid intake, although client not eating well.  Use sprite or gatorade.  Allowed to ventilate and end of life issues.  Wife is tired and worried about client.

## 2016-04-10 ENCOUNTER — Encounter (INDEPENDENT_AMBULATORY_CARE_PROVIDER_SITE_OTHER): Payer: Self-pay

## 2016-04-10 ENCOUNTER — Ambulatory Visit
Admission: RE | Admit: 2016-04-10 | Discharge: 2016-04-10 | Disposition: A | Discharge status: Home / Self Care | Payer: Medicare HMO | Source: Ambulatory Visit | Attending: Surgery | Admitting: Surgery

## 2016-04-10 DIAGNOSIS — C3490 Malignant neoplasm of unspecified part of unspecified bronchus or lung: Secondary | ICD-10-CM

## 2016-04-10 DIAGNOSIS — Z4802 Encounter for removal of sutures: Secondary | ICD-10-CM

## 2016-04-10 DIAGNOSIS — C3411 Malignant neoplasm of upper lobe, right bronchus or lung: Secondary | ICD-10-CM

## 2016-04-13 ENCOUNTER — Telehealth: Payer: Self-pay | Admitting: Medical Oncology

## 2016-04-13 DIAGNOSIS — C3490 Malignant neoplasm of unspecified part of unspecified bronchus or lung: Secondary | ICD-10-CM

## 2016-04-13 MED ORDER — HYDROCOD POLST-CPM POLST ER 10-8 MG/5ML PO SUER: 5.0000 mL | Freq: Two times a day (BID) | ORAL | 0 refills | Status: DC | PRN

## 2016-04-13 NOTE — Telephone Encounter (Signed)
Left tmessage for Renee that tussionex dose cannot be increased and Cyndee Berniece Salines suggested ? morphine.

## 2016-04-13 NOTE — Telephone Encounter (Signed)
Local pharmacy cannot fill quantity -they only have 40 ml. I faxed rx to another pharmacy per pt request.

## 2016-04-13 NOTE — Telephone Encounter (Signed)
Refill tussionex -Done

## 2016-04-17 ENCOUNTER — Telehealth: Payer: Self-pay | Admitting: *Deleted

## 2016-04-17 ENCOUNTER — Other Ambulatory Visit: Payer: Self-pay | Admitting: Internal Medicine

## 2016-04-17 NOTE — Telephone Encounter (Signed)
"  Alan [name] calling to request refills for prednisone 10 mg and Oxycodone 5-325 mg.  He is walking and feeling better with the prednisone.  He has a cough and hurts really bad to right chest. Delsym is not working.  Could he try Tessalon Pearles?  Uses [facility] on [address]."

## 2016-04-18 ENCOUNTER — Other Ambulatory Visit: Payer: Self-pay | Admitting: *Deleted

## 2016-04-18 DIAGNOSIS — C3411 Malignant neoplasm of upper lobe, right bronchus or lung: Secondary | ICD-10-CM

## 2016-04-18 DIAGNOSIS — E860 Dehydration: Secondary | ICD-10-CM

## 2016-04-18 MED ORDER — OXYCODONE-ACETAMINOPHEN 5-325 MG PO TABS: 1.0000 | ORAL_TABLET | ORAL | 0 refills | Status: DC | PRN

## 2016-04-18 MED ORDER — PREDNISONE 10 MG PO TABS: 10.0000 mg | ORAL_TABLET | Freq: Every day | ORAL | 0 refills | Status: DC

## 2016-04-18 NOTE — Telephone Encounter (Signed)
Hospice RN called with request for pain meds and prednisone refill. Rx faxed to Hospice.

## 2016-04-19 NOTE — Telephone Encounter (Signed)
Rx faxed to pt pharmacy 

## 2016-04-27 ENCOUNTER — Telehealth: Payer: Self-pay | Admitting: *Deleted

## 2016-04-27 MED ORDER — LEVOTHYROXINE SODIUM 125 MCG PO TABS: 125.0000 ug | ORAL_TABLET | Freq: Every day | ORAL | 0 refills | Status: DC

## 2016-04-27 NOTE — Telephone Encounter (Signed)
Received call from Hospice RN/[place]/ [place] asking for refill on levothyroxine.  Script sent to pharmacy.

## 2016-04-27 NOTE — Telephone Encounter (Signed)
Received

## 2016-05-01 ENCOUNTER — Other Ambulatory Visit: Payer: Self-pay | Admitting: Medical Oncology

## 2016-05-02 ENCOUNTER — Ambulatory Visit: Payer: Medicare HMO | Admitting: Surgery

## 2016-05-03 ENCOUNTER — Telehealth: Payer: Self-pay | Admitting: *Deleted

## 2016-05-03 NOTE — Telephone Encounter (Signed)
Renae, RN from Hospice called advised pt will be revoking Hospice on 11/8 as he will be going to [facility] in [place].  Pt flying out 11/12; has [facility] appt 11/13. RN advised pt has requested oxygen prior to leaving and throughout his trip.  Pt currently PAO2% is at 98% and does not qualify. As of Wed 11/8 at 5pm pt will no longer be on hospice per pt request. Will give message to MD for review.  Informed Renae, MD out of office until Monday.

## 2016-05-05 ENCOUNTER — Other Ambulatory Visit: Payer: Self-pay | Admitting: Pulmonary Disease

## 2016-05-07 ENCOUNTER — Telehealth: Payer: Self-pay | Admitting: *Deleted

## 2016-05-07 NOTE — Telephone Encounter (Signed)
Patient's daughter called requesting "records for chemotherapy flow sheets.  We received a large white envelope but these were not present."   Call transferred to H.I.M.

## 2016-05-08 ENCOUNTER — Telehealth: Payer: Self-pay | Admitting: *Deleted

## 2016-05-08 ENCOUNTER — Other Ambulatory Visit: Payer: Self-pay | Admitting: *Deleted

## 2016-05-08 ENCOUNTER — Telehealth: Payer: Self-pay | Admitting: Pulmonary Disease

## 2016-05-08 DIAGNOSIS — R066 Hiccough: Secondary | ICD-10-CM

## 2016-05-08 DIAGNOSIS — C3490 Malignant neoplasm of unspecified part of unspecified bronchus or lung: Secondary | ICD-10-CM

## 2016-05-08 MED ORDER — CHLORPROMAZINE HCL 25 MG PO TABS
25.0000 mg | ORAL_TABLET | Freq: Three times a day (TID) | ORAL | 0 refills | Status: DC | PRN
Start: 2016-05-08 — End: 2016-08-27

## 2016-05-08 MED ORDER — HYDROCOD POLST-CPM POLST ER 10-8 MG/5ML PO SUER: 5.0000 mL | Freq: Two times a day (BID) | ORAL | 0 refills | Status: DC | PRN

## 2016-05-08 NOTE — Telephone Encounter (Signed)
Call from [place] at [facility], pt willing to pay out of pocket for Oxygen to use while traveling to [place]. Pt is revoking hospice services Wednesday 11/8. Hospice RN requesting refill on thorazine and tussinex. Rx to be signed by MD and faxed to Pt pharmacy and [facility].  No further concerns.

## 2016-05-08 NOTE — Telephone Encounter (Signed)
Per SN---  He will also need all of his records for this appt in [place].  He will either need to get these records or have that doctor request his records.   We can give an order for the oxygen for 2 liters PRN> He will also need pulse ox too, so he can check his oxygen levels.  Will call and speak with Renee from hospice and let her know of SN recs.

## 2016-05-08 NOTE — Telephone Encounter (Signed)
Renee with hospice called to let us know patient is revoking hospice as of 5:00pm tomorrow; he is flying out to [place] to pursue more intensive treatment at [facility]. Pt currently has O2 24/7 for past 3 weeks now through [facility] has attempted to get qualifying sats for patient to continue O2 once off hospice but can not get O2 levels below 95-96% RA. She is concerned with patient flying in high altitude and traveling in [place] without O2.   Pt has expressed to [facility] that he is willing to pay out of pocket to [facility] for O2.  Renee would like to know if SN will be willing to send order to [facility] for patient to have O2.   [facility] [name] [facility] in [facility].   [name] has been attending [facility] for hospice but no one has responded to [facility] since Friday last week.   SN Please advise. Last OV: 10/20/15

## 2016-05-08 NOTE — Telephone Encounter (Signed)
On 05-08-16 mail medical records to patient

## 2016-05-09 ENCOUNTER — Other Ambulatory Visit: Payer: Self-pay | Admitting: Internal Medicine

## 2016-05-09 ENCOUNTER — Other Ambulatory Visit: Payer: Self-pay | Admitting: Medical Oncology

## 2016-05-09 ENCOUNTER — Telehealth: Payer: Self-pay | Admitting: Medical Oncology

## 2016-05-09 ENCOUNTER — Telehealth: Payer: Self-pay | Admitting: Pulmonary Disease

## 2016-05-09 DIAGNOSIS — C3490 Malignant neoplasm of unspecified part of unspecified bronchus or lung: Secondary | ICD-10-CM

## 2016-05-09 DIAGNOSIS — R066 Hiccough: Secondary | ICD-10-CM

## 2016-05-09 MED ORDER — FINASTERIDE 5 MG PO TABS: 5.0000 mg | ORAL_TABLET | Freq: Every day | ORAL | 2 refills | Status: DC

## 2016-05-09 MED ORDER — HYDROCOD POLST-CPM POLST ER 10-8 MG/5ML PO SUER: 5.0000 mL | Freq: Two times a day (BID) | ORAL | 0 refills | Status: DC | PRN

## 2016-05-09 NOTE — Telephone Encounter (Signed)
Faxed oxygen order and last office note.

## 2016-05-09 NOTE — Telephone Encounter (Signed)
Called and spoke with pt and he is aware of refills that has been sent to the pharmacy.  Nothing further is needed.

## 2016-05-09 NOTE — Telephone Encounter (Signed)
Called and spoke with Alan [name] with hospice and she stated that Dr. Earlie Server has already done the order for the oxygen and they have already discharged the pt from hospice. Nothing further is needed.

## 2016-05-09 NOTE — Congregational Nurse Program (Signed)
Congregational Nurse Program Note  Date of Encounter: 04/08/2016  Past Medical History: Past Medical History:  Diagnosis Date  . Anxiety state, unspecified   . Benign hypertrophy of prostate   . Benign neoplasm of colon    colon polyps  . Cancer [facility])    prostate cancer  . Chronic fatigue 12/23/2015  . Constipation, chronic   . Dehydration 02/22/2016  . Diverticulosis of colon (without mention of hemorrhage)   . Dysuria 03/07/2016  . Encounter for antineoplastic chemotherapy 09/29/2015  . Encounter for antineoplastic immunotherapy 12/23/2015  . Erectile dysfunction   . GERD (gastroesophageal reflux disease)   . Glaucoma   . Hypercholesteremia   . Hypothyroidism   . Irritable bowel syndrome   . Left hip pain 10/27/2015  . Malignant pleural effusion    right  . Onychomycosis   . Radiation 06/20/15-08/02/15   right upper lung region 60 gray  . Restless legs syndrome (RLS)   . Sleep apnea    does not use cpap  . Unspecified essential hypertension     Encounter Details:   Client at church today.  Needing help in a wheelchair.  Still pain in back and legs.  Loss of feeling in his legs.  Still has such a good attitude and outlook

## 2016-05-09 NOTE — Progress Notes (Signed)
Oxygen order and refill for thorazine and tussionex faxed .

## 2016-05-10 NOTE — Congregational Nurse Program (Signed)
Congregational Nurse Program Note  Date of Encounter: 04/29/2016  Past Medical History: Past Medical History:  Diagnosis Date  . Anxiety state, unspecified   . Benign hypertrophy of prostate   . Benign neoplasm of colon    colon polyps  . Cancer [facility])    prostate cancer  . Chronic fatigue 12/23/2015  . Constipation, chronic   . Dehydration 02/22/2016  . Diverticulosis of colon (without mention of hemorrhage)   . Dysuria 03/07/2016  . Encounter for antineoplastic chemotherapy 09/29/2015  . Encounter for antineoplastic immunotherapy 12/23/2015  . Erectile dysfunction   . GERD (gastroesophageal reflux disease)   . Glaucoma   . Hypercholesteremia   . Hypothyroidism   . Irritable bowel syndrome   . Left hip pain 10/27/2015  . Malignant pleural effusion    right  . Onychomycosis   . Radiation 06/20/15-08/02/15   right upper lung region 60 gray  . Restless legs syndrome (RLS)   . Sleep apnea    does not use cpap  . Unspecified essential hypertension     Encounter Details:     CNP Questionnaire - 04/29/16 0012      Patient Demographics   Is this a new or existing patient? Existing   Patient is considered a/an Not Applicable   Race African-American/Black     Patient Assistance   Location of Patient Assistance [employer]   Patient's financial/insurance status Low Income;Medicare   Uninsured Patient ([employer]) No   Patient referred to apply for the following financial assistance [employer] insecurities addressed Not Applicable   Transportation assistance No   Assistance securing medications No   Educational health offerings Cancer;Safety;Health literacy;Nutrition;Spiritual care;Chronic disease;Navigating the healthcare system     Encounter Details   Primary purpose of visit Education/Health Concerns;Safety;Chronic Illness/Condition Visit;Spiritual Care/Support Visit;Post PCP Visit;Navigating the Healthcare System   Was an Emergency  Department visit averted? No   Does patient have a medical provider? Yes   Patient referred to Follow up with established PCP   Was a mental health screening completed? (GAINS tool) No   Does patient have dental issues? No   Does patient have vision issues? Yes   Does your patient have an abnormal blood pressure today? No   Since previous encounter, have you referred patient for abnormal blood pressure that resulted in a new diagnosis or medication change? No   Does your patient have an abnormal blood glucose today? No   Since previous encounter, have you referred patient for abnormal blood glucose that resulted in a new diagnosis or medication change? No   Was there a life-saving intervention made? No      Post chemo.  Client is feeling better.  He has been walking  with a cane.  Spoke during service.  1st time in a while.  Will continue praying for the family and Vu[place] Musa.

## 2016-05-23 ENCOUNTER — Other Ambulatory Visit: Payer: Self-pay

## 2016-05-23 MED ORDER — FINASTERIDE 5 MG PO TABS: 5.0000 mg | ORAL_TABLET | Freq: Every day | ORAL | 2 refills | Status: AC

## 2016-05-25 ENCOUNTER — Other Ambulatory Visit: Payer: Self-pay | Admitting: Internal Medicine

## 2016-05-25 ENCOUNTER — Telehealth: Payer: Self-pay | Admitting: Pulmonary Disease

## 2016-05-25 NOTE — Telephone Encounter (Signed)
This particular medication is not prescribed by our office. It looks as though Dr. Cruzita Lederer fills this. lmtcb x1 for pt's wife.

## 2016-05-28 ENCOUNTER — Other Ambulatory Visit: Payer: Self-pay | Admitting: Internal Medicine

## 2016-05-28 ENCOUNTER — Telehealth: Payer: Self-pay | Admitting: Medical Oncology

## 2016-05-28 ENCOUNTER — Other Ambulatory Visit: Payer: Self-pay | Admitting: Medical Oncology

## 2016-05-28 DIAGNOSIS — C3490 Malignant neoplasm of unspecified part of unspecified bronchus or lung: Secondary | ICD-10-CM

## 2016-05-28 MED ORDER — HYDROCOD POLST-CPM POLST ER 10-8 MG/5ML PO SUER: 5.0000 mL | Freq: Two times a day (BID) | ORAL | 0 refills | Status: DC | PRN

## 2016-05-28 NOTE — Telephone Encounter (Signed)
Spoke with wife, aware of rec's Nothing further needed.

## 2016-05-28 NOTE — Telephone Encounter (Signed)
lmtcb x1 for pt's wife. 

## 2016-05-28 NOTE — Telephone Encounter (Signed)
Patient wife Enid Derry called and left message on answering service on 05/25/16 @ 12:51pm regarding rx -pr

## 2016-05-28 NOTE — Telephone Encounter (Signed)
I returned wife's call. Pt requests refill for cough syrup and pain med. I told wife rx ready for pick up. Wife stated pt went to [facility]  and he will be calling back next week for appt re further treatment.

## 2016-05-29 ENCOUNTER — Telehealth: Payer: Self-pay | Admitting: Internal Medicine

## 2016-05-29 NOTE — Telephone Encounter (Signed)
Refill of levothyroxine (SYNTHROID, LEVOTHROID) 125 MCG tablet   [facility] [place], [address] [phone] (Phone) [phone] (Fax)

## 2016-05-29 NOTE — Telephone Encounter (Signed)
It was sent on 11/24. Left detailed message.

## 2016-05-31 ENCOUNTER — Ambulatory Visit (HOSPITAL_BASED_OUTPATIENT_CLINIC_OR_DEPARTMENT_OTHER): Payer: Medicare Other

## 2016-05-31 ENCOUNTER — Telehealth: Payer: Self-pay | Admitting: Internal Medicine

## 2016-05-31 ENCOUNTER — Encounter: Payer: Self-pay | Admitting: Nurse Practitioner

## 2016-05-31 ENCOUNTER — Telehealth: Payer: Self-pay | Admitting: *Deleted

## 2016-05-31 ENCOUNTER — Ambulatory Visit (HOSPITAL_BASED_OUTPATIENT_CLINIC_OR_DEPARTMENT_OTHER): Payer: Medicare HMO | Admitting: Nurse Practitioner

## 2016-05-31 VITALS — BP 109/67 | HR 126 | Temp 98.6°F | Resp 17 | Ht 66.0 in | Wt 139.2 lb

## 2016-05-31 DIAGNOSIS — M25552 Pain in left hip: Secondary | ICD-10-CM

## 2016-05-31 DIAGNOSIS — C7951 Secondary malignant neoplasm of bone: Secondary | ICD-10-CM

## 2016-05-31 DIAGNOSIS — Z5111 Encounter for antineoplastic chemotherapy: Secondary | ICD-10-CM

## 2016-05-31 DIAGNOSIS — C3411 Malignant neoplasm of upper lobe, right bronchus or lung: Secondary | ICD-10-CM | POA: Diagnosis present

## 2016-05-31 DIAGNOSIS — F329 Major depressive disorder, single episode, unspecified: Secondary | ICD-10-CM | POA: Diagnosis not present

## 2016-05-31 DIAGNOSIS — G893 Neoplasm related pain (acute) (chronic): Secondary | ICD-10-CM

## 2016-05-31 LAB — COMPREHENSIVE METABOLIC PANEL
ALT: 13 U/L (ref 0–55)
AST: 9 U/L (ref 5–34)
Albumin: 2.6 g/dL — ABNORMAL LOW (ref 3.5–5.0)
Alkaline Phosphatase: 77 U/L (ref 40–150)
Anion Gap: 7 mEq/L (ref 3–11)
BUN: 13.8 mg/dL (ref 7.0–26.0)
CALCIUM: 9.4 mg/dL (ref 8.4–10.4)
CHLORIDE: 107 meq/L (ref 98–109)
CO2: 27 meq/L (ref 22–29)
CREATININE: 0.9 mg/dL (ref 0.7–1.3)
EGFR: >90 mL/min/{1.73_m2} (ref >90)
GLUCOSE: 144 mg/dL — AB (ref 70–140)
POTASSIUM: 4.4 meq/L (ref 3.5–5.1)
SODIUM: 141 meq/L (ref 136–145)
Total Bilirubin: <0.22 mg/dL (ref 0.20–1.20)
Total Protein: 6.9 g/dL (ref 6.4–8.3)

## 2016-05-31 LAB — CBC WITH DIFFERENTIAL/PLATELET
BASO%: 0.2 % (ref 0.0–2.0)
BASOS ABS: 0 10*3/uL (ref 0.0–0.1)
EOS%: 0 % (ref 0.0–7.0)
Eosinophils Absolute: 0 10*3/uL (ref 0.0–0.5)
HEMATOCRIT: 33.3 % — AB (ref 38.4–49.9)
HGB: 10.5 g/dL — ABNORMAL LOW (ref 13.0–17.1)
LYMPH#: 0.4 10*3/uL — AB (ref 0.9–3.3)
LYMPH%: 8.6 % — ABNORMAL LOW (ref 14.0–49.0)
MCH: 27.1 pg — AB (ref 27.2–33.4)
MCHC: 31.5 g/dL — AB (ref 32.0–36.0)
MCV: 85.8 fL (ref 79.3–98.0)
MONO#: 0.3 10*3/uL (ref 0.1–0.9)
MONO%: 6.7 % (ref 0.0–14.0)
NEUT#: 4 10*3/uL (ref 1.5–6.5)
NEUT%: 84.5 % — AB (ref 39.0–75.0)
Platelets: 333 10*3/uL (ref 140–400)
RBC: 3.88 10*6/uL — ABNORMAL LOW (ref 4.20–5.82)
RDW: 18.2 % — AB (ref 11.0–14.6)
WBC: 4.8 10*3/uL (ref 4.0–10.3)

## 2016-05-31 NOTE — Assessment & Plan Note (Signed)
Patient last received on the volar immunotherapy in August 2017.  He then made the decision to enter the hospice program.  However, patient changed his mind; and traveled to [facility] in [place] for a second opinion.  He was advised while at [facility] that he should consider gemcitabine single agent therapy.  Patient presents back to the [place] today with complaint of increased cough, occasional wheezing, and mild increase shortness of breath.  He denied any chest pain, chest pressure, or pain with inspiration.  He does have some chronic achiness to the right side of his ribs.  He denies any recent fevers or chills.  Dr. [name] in to speak to the patient as well and reviewed the most recent [facility] notes.  On exam today.  Patient appears to have gained weight and appears to feel much better.  Breath sounds with wheezing to all lung fields, and a chronic, dry cough.  He does appear to have some cough spasms as well.  Confirmed the patient does have cough medicine to take at home.  Dr. [name] has recommended that patient obtain labs today as a baseline.  Also, will schedule a CT with contrast of the chest/abdomen/pelvis for sent as possible for restaging purposes.  Will schedule patient for gemcitabine to begin next week as soon as possible.  Also, has requested that patient be scheduled for labs, follow up visit with Dr. [name], and gemcitabine chemotherapy again approximately 3 weeks after his first cycle.  Both patient and his wife were in agreement with this plan of care.

## 2016-05-31 NOTE — Progress Notes (Signed)
SYMPTOM MANAGEMENT CLINIC    Chief Complaint: Follow-up lung cancer, cough  HPI:  Alan [name] 75 y.o. male diagnosed with lung cancer with bone metastasis.  Currently undergoing observation only.   Oncology History   Patient presented with gynecomastia.  Work up showed right upper lung mass.  Malignant neoplasm of right upper lobe of lung ([place])   Staging form: Lung, AJCC 7th Edition     Clinical stage from 06/03/2015: Stage IIIA (T1b, N2, M0) - Signed by Curt Bears, MD on 06/03/2015       Malignant neoplasm of right upper lobe of lung ([place])   05/02/2015 Imaging    CT Chest IMPRESSION: 1. Posterior right upper lobe lung nodule, most consistent with primary bronchogenic carcinoma. 2. Mild mediastinal adenopathy, suspicious for nodal metastasis      05/19/2015 Imaging    PET Scan IMPRESSION: 2.8 cm hypermetabolic posterior right upper lobe pulmonary nodule, consistent with primary bronchogenic carcinoma.  Mild hypermetabolic right hilar and right paratracheal lymphadenopathy, suspicious for metastatic disease.      05/22/2015 Surgery    Right upper lobe lung mass and hypermetabolic mediastinal adenopathy      05/31/2015 Pathology Results    1. Lymph node, biopsy, 4R - METASTATIC ADENOCARCINOMA. - SEE COMMENT. 2. Lymph node, biopsy, 2R - METASTATIC ADENOCARCINOMA.      06/03/2015 Initial Diagnosis    Malignant neoplasm of right upper lobe of lung ([place])      06/13/2015 -  Chemotherapy    1st chemo      06/14/2015 -  Radiation Therapy    SIM       Review of Systems  Constitutional: Positive for malaise/fatigue.  Respiratory: Positive for cough, shortness of breath and wheezing.   All other systems reviewed and are negative.   Past Medical History:  Diagnosis Date  . Anxiety state, unspecified   . Benign hypertrophy of prostate   . Benign neoplasm of colon    colon polyps  . Cancer [facility])    prostate cancer  . Chronic fatigue 12/23/2015  .  Constipation, chronic   . Dehydration 02/22/2016  . Diverticulosis of colon (without mention of hemorrhage)   . Dysuria 03/07/2016  . Encounter for antineoplastic chemotherapy 09/29/2015  . Encounter for antineoplastic immunotherapy 12/23/2015  . Erectile dysfunction   . GERD (gastroesophageal reflux disease)   . Glaucoma   . Hypercholesteremia   . Hypothyroidism   . Irritable bowel syndrome   . Left hip pain 10/27/2015  . Malignant pleural effusion    right  . Onychomycosis   . Radiation 06/20/15-08/02/15   right upper lung region 60 gray  . Restless legs syndrome (RLS)   . Sleep apnea    does not use cpap  . Unspecified essential hypertension     Past Surgical History:  Procedure Laterality Date  . APPENDECTOMY  12/2012  . CARDIAC CATHETERIZATION  ? 2008  . CHEST TUBE INSERTION Right 03/01/2016   Procedure: INSERTION PLEURAL DRAINAGE CATHETER with doxycycline pleurodesis;  Surgeon: Gaye Pollack, MD;  Location: [facility] OR;  Service: Thoracic;  Laterality: Right;  . COLONOSCOPY    . LAPAROSCOPIC APPENDECTOMY N/A 01/27/2013   Procedure: APPENDECTOMY LAPAROSCOPIC;  Surgeon: Edward Jolly, MD;  Location: [facility] OR[facility];  Service: General;  Laterality: N/A;  . MEDIASTINOSCOPY N/A 05/31/2015   Procedure: MEDIASTINOSCOPY;  Surgeon: Gaye Pollack, MD;  Location: [place];  Service: Thoracic;  Laterality: N/A;  . none    . REMOVAL OF PLEURAL DRAINAGE CATHETER  Right 04/04/2016   Procedure: REMOVAL OF PLEURAL DRAINAGE CATHETER;  Surgeon: Gaye Pollack, MD;  Location: [place];  Service: Thoracic;  Laterality: Right;  . THYROIDECTOMY    . TONSILLECTOMY    . VIDEO BRONCHOSCOPY N/A 05/31/2015   Procedure: VIDEO BRONCHOSCOPY;  Surgeon: Gaye Pollack, MD;  Location: [facility] OR;  Service: Thoracic;  Laterality: N/A;    has ONYCHOMYCOSIS; COLONIC POLYPS; HYPERCHOLESTEROLEMIA; Anxiety state; ERECTILE DYSFUNCTION; OBSTRUCTIVE SLEEP APNEA; RESTLESS LEG SYNDROME; Essential hypertension; GERD; Diverticulosis of large  intestine; IRRITABLE BOWEL SYNDROME; MEMORY LOSS; CHEST WALL PAIN, ANTERIOR; BPH (benign prostatic hypertrophy) with urinary obstruction; Gynecomastia, male; Decreased hearing; Prostate cancer ([place]); Postablative hypothyroidism; Constipation; Malignant neoplasm of right upper lobe of lung ([place]); Encounter for antineoplastic chemotherapy; Left hip pain; Chronic fatigue; Encounter for antineoplastic immunotherapy; Bone metastasis ([place]); Pleural effusion; and Dehydration on his problem list.    is allergic to codeine.    Medication List       Accurate as of 05/31/16  4:18 PM. Always use your most recent med list.          acetaminophen 500 MG tablet Commonly known as:  TYLENOL Take 1,000 mg by mouth every 4 (four) hours as needed for mild pain, moderate pain, fever or headache.   aspirin EC 81 MG tablet Take 81 mg by mouth daily.   CENTRUM SILVER 50+MEN Tabs Take 1 tablet by mouth daily.   chlorpheniramine-HYDROcodone 10-8 MG/5ML Suer Commonly known as:  TUSSIONEX Take 5 mLs by mouth every 12 (twelve) hours as needed for cough.   chlorproMAZINE 25 MG tablet Commonly known as:  THORAZINE Take 1 tablet (25 mg total) by mouth 3 (three) times daily as needed for hiccoughs.   COMBIGAN 0.2-0.5 % ophthalmic solution Generic drug:  brimonidine-timolol Place 1 drop into both eyes every 12 (twelve) hours.   dronabinol 2.5 MG capsule Commonly known as:  MARINOL Take 1 capsule (2.5 mg total) by mouth 2 (two) times daily before a meal. for appetite   finasteride 5 MG tablet Commonly known as:  [name] Take 1 tablet (5 mg total) by mouth daily.   fluticasone 50 MCG/ACT nasal spray Commonly known as:  FLONASE Place 2 sprays into both nostrils daily as needed for allergies or rhinitis.   guaiFENesin 600 MG 12 hr tablet Commonly known as:  MUCINEX Take 2 tablets (1,200 mg total) by mouth 2 (two) times daily.   lactulose 10 GM/15ML solution Commonly known as:  CHRONULAC Take 10 g by  mouth 2 (two) times daily as needed (constipation).   levothyroxine 125 MCG tablet Commonly known as:  SYNTHROID, LEVOTHROID TAKE 1 TABLET BY MOUTH DAILY   LINZESS 290 MCG Caps capsule Generic drug:  linaclotide TAKE ONE CAPSULE BY MOUTH ONCE DAILY   meloxicam 7.5 MG tablet Commonly known as:  MOBIC Take 1 tablet (7.5 mg total) by mouth daily.   mirtazapine 30 MG tablet Commonly known as:  REMERON Take 1 tablet (30 mg total) by mouth at bedtime.   omeprazole 20 MG capsule Commonly known as:  PRILOSEC Take 1 capsule (20 mg total) by mouth every morning.   oxyCODONE-acetaminophen 5-325 MG tablet Commonly known as:  PERCOCET/ROXICET Take 1 tablet by mouth every 4 (four) hours as needed for severe pain.   predniSONE 10 MG tablet Commonly known as:  DELTASONE TAKE 1 TABLET BY MOUTH EVERY MORNING WITH BREAKFAST   prochlorperazine 10 MG tablet Commonly known as:  COMPAZINE Take 1 tablet (10 mg total) by mouth every 6 (  six) hours as needed for nausea or vomiting.   senna-docusate 8.6-50 MG tablet Commonly known as:  Senokot-S Take 2 tablets by mouth 2 (two) times daily.   sildenafil 100 MG tablet Commonly known as:  VIAGRA Take 1 tablet (100 mg total) by mouth as directed.   simvastatin 20 MG tablet Commonly known as:  ZOCOR TAKE 1 TABLET BY MOUTH AT BEDTIME   sucralfate 1 g tablet Commonly known as:  CARAFATE Take 1 tablet (1 g total) by mouth 4 (four) times daily -  with meals and at bedtime. Mix in 2Tbs water, swish and swallow   tamsulosin 0.4 MG Caps capsule Commonly known as:  FLOMAX Take 2 capsules (0.8 mg total) by mouth at bedtime.   traMADol 50 MG tablet Commonly known as:  ULTRAM Take 1 tablet (50 mg total) by mouth every 6 (six) hours as needed for moderate pain.   VITAMIN D PO Take 500 Units by mouth daily.        PHYSICAL EXAMINATION  Oncology Vitals 05/31/2016 04/05/2016  Height 168 cm 168 cm  Weight 63.141 kg 56.881 kg  Weight (lbs) 139 lbs  3 oz 125 lbs 6 oz  BMI (kg/m2) 22.47 kg/m2 20.24 kg/m2  Temp 98.6 -  Pulse 126 124  Resp 17 -  SpO2 99 100  BSA (m2) 1.71 m2 1.63 m2   BP Readings from Last 2 Encounters:  05/31/16 109/67  04/05/16 108/79    Physical Exam  Constitutional: He is oriented to person, place, and time and well-developed, well-nourished, and in no distress.  HENT:  Head: Normocephalic and atraumatic.  Mouth/Throat: Oropharynx is clear and moist.  Eyes: Conjunctivae and EOM are normal. Pupils are equal, round, and reactive to light. Right eye exhibits no discharge. Left eye exhibits no discharge. No scleral icterus.  Neck: Normal range of motion. Neck supple. No JVD present. No tracheal deviation present. No thyromegaly present.  Cardiovascular: Normal rate, regular rhythm, normal heart sounds and intact distal pulses.   Pulmonary/Chest: Effort normal. No respiratory distress. He has wheezes. He has rales. He exhibits no tenderness.  Abdominal: Soft. Bowel sounds are normal. He exhibits no distension and no mass. There is no tenderness. There is no rebound and no guarding.  Musculoskeletal: Normal range of motion. He exhibits no edema, tenderness or deformity.  Lymphadenopathy:    He has no cervical adenopathy.  Neurological: He is alert and oriented to person, place, and time. Gait normal.  Skin: Skin is warm and dry. No rash noted. No erythema. No pallor.  Psychiatric: Affect normal.  Nursing note and vitals reviewed.   LABORATORY DATA:. Appointment on 05/31/2016  Component Date Value Ref Range Status  . WBC 05/31/2016 4.8  4.0 - 10.3 10e3/uL Final  . NEUT# 05/31/2016 4.0  1.5 - 6.5 10e3/uL Final  . HGB 05/31/2016 10.5* 13.0 - 17.1 g/dL Final  . HCT 05/31/2016 33.3* 38.4 - 49.9 % Final  . Platelets 05/31/2016 333  140 - 400 10e3/uL Final  . MCV 05/31/2016 85.8  79.3 - 98.0 fL Final  . MCH 05/31/2016 27.1* 27.2 - 33.4 pg Final  . MCHC 05/31/2016 31.5* 32.0 - 36.0 g/dL Final  . RBC 05/31/2016  3.88* 4.20 - 5.82 10e6/uL Final  . RDW 05/31/2016 18.2* 11.0 - 14.6 % Final  . lymph# 05/31/2016 0.4* 0.9 - 3.3 10e3/uL Final  . MONO# 05/31/2016 0.3  0.1 - 0.9 10e3/uL Final  . Eosinophils Absolute 05/31/2016 0.0  0.0 - 0.5 10e3/uL Final  .  Basophils Absolute 05/31/2016 0.0  0.0 - 0.1 10e3/uL Final  . NEUT% 05/31/2016 84.5* 39.0 - 75.0 % Final  . LYMPH% 05/31/2016 8.6* 14.0 - 49.0 % Final  . MONO% 05/31/2016 6.7  0.0 - 14.0 % Final  . EOS% 05/31/2016 0.0  0.0 - 7.0 % Final  . BASO% 05/31/2016 0.2  0.0 - 2.0 % Final    RADIOGRAPHIC STUDIES: No results found.  ASSESSMENT/PLAN:    Malignant neoplasm of right upper lobe of lung [facility]) Patient last received on the volar immunotherapy in August 2017.  He then made the decision to enter the hospice program.  However, patient changed his mind; and traveled to [facility] in [place] for a second opinion.  He was advised while at [facility] that he should consider gemcitabine single agent therapy.  Patient presents back to the [facility] today with complaint of increased cough, occasional wheezing, and mild increase shortness of breath.  He denied any chest pain, chest pressure, or pain with inspiration.  He does have some chronic achiness to the right side of his ribs.  He denies any recent fevers or chills.  Dr. [name] in to speak to the patient as well and reviewed the most recent [facility] notes.  On exam today.  Patient appears to have gained weight and appears to feel much better.  Breath sounds with wheezing to all lung fields, and a chronic, dry cough.  He does appear to have some cough spasms as well.  Confirmed the patient does have cough medicine to take at home.  Dr. [name] has recommended that patient obtain labs today as a baseline.  Also, will schedule a CT with contrast of the chest/abdomen/pelvis for sent as possible for restaging purposes.  Will schedule patient for gemcitabine to begin next week as soon as  possible.  Also, has requested that patient be scheduled for labs, follow up visit with Dr. [name], and gemcitabine chemotherapy again approximately 3 weeks after his first cycle.  Both patient and his wife were in agreement with this plan of care.   Patient stated understanding of all instructions; and was in agreement with this plan of care. The patient knows to call the clinic with any problems, questions or concerns.   Total time spent with patient was 25 minutes;  with greater than 75 percent of that time spent in face to face counseling regarding patient's symptoms,  and coordination of care and follow up.  Disclaimer:This dictation was prepared with Dragon/digital dictation along with [employer]. Any transcriptional errors that result from this process are unintentional.  Drue Second, NP 05/31/2016   ADDENDUM: Hematology/Oncology Attending: I had a face to face encounter with the patient today. I recommended his care plan. This is a very pleasant 75 years old African-American male with stage IV non-small cell lung cancer status post several chemotherapy regimens including treatment with a course of concurrent chemoradiation followed by consolidation chemotherapy with carboplatin and Alimta followed by 4 cycles of immunotherapy with Nivolumab discontinued secondary to disease progression. The patient was not feeling very well by the end of his treatment with immunotherapy and he was not a candidate for any further treatment at that time. He was referred to the hospice and palliative care. After spending few months with the hospice service who started feeling better and was interested in resuming treatment again. He gained more than 20 pounds in the last 2 months. He was also seen for second appearing at [facility] by Dr.  Fossella. I had a recent discussion with Dr. Yolanda Bonine about his condition recently. The patient is interested in resuming systemic therapy. He is feeling  a little bit stronger today. He gained a lot of weight. He continues to have shortness breath with exertion. He also has pain in the pelvic area and currently on tramadol. I recommended for him regimen consisting of gemcitabine 1000 MG/M2 on days 1 and 8 every 3 weeks. I discussed with the patient adverse effect of this treatment including but not limited to alopecia, myelosuppression, nausea and vomiting, peripheral neuropathy, liver or renal dysfunction. He is expected to start the first dose of his treatment in one week. For pain management, he will continue on tramadol and oxycodone. For depression I recommended for the patient to continue treatment with Remeron 30 mg by mouth daily at bedtime. He would come back for follow-up visit in 3 weeks for evaluation and management of any adverse effect of his treatment. He was advised to call immediately if he has any concerning symptoms in the interval.  Disclaimer: This note was dictated with voice recognition software. Similar sounding words can inadvertently be transcribed and may be missed upon review. Eilleen Kempf., MD 06/01/16

## 2016-05-31 NOTE — Telephone Encounter (Signed)
Patient came to check out after seeing Selena Lesser to schedule his Chemotherapy and follow up appointments. There was no LOS or Disposition history with orders or a treatment plan. A message was found in the schedule message box, with information regarding the the patient starting Chemo , labs and follow up appointments with Dr [name], to be scheduled every 3 weeks. Per message, Chemo is to be started on 06/06/16 or 06/07/16. The patient has never had Chemo before and has not been authorized by his [employer] as yet. A message was sent to [facility] requesting to be added. Labs and follow up appointments will be scheduled after the 1st Chemo date, has been entered/scheduled. 2 bottles of contrast was also given to the patient, per schedule message.

## 2016-05-31 NOTE — Telephone Encounter (Signed)
"  This is Shirlean Mylar calling for my father Alan. Lovena [name].  He's asking to be seen today by Dr. [name].  He's coughing continually.  Chest pain to upper chest and neck with cough.  Takes delsym and hydrocodone cough syrup.  Spits out white phlegm.  No fever.  No longer being followed by Hospice after going to [place]."  Verbal order received and read back for patient to be seen in [facility] Today. Message sent to schedulers.  Patient will come in today for 1:45 pm visit.

## 2016-06-01 ENCOUNTER — Other Ambulatory Visit: Payer: Self-pay | Admitting: Nurse Practitioner

## 2016-06-01 ENCOUNTER — Telehealth: Payer: Self-pay | Admitting: Internal Medicine

## 2016-06-01 NOTE — Telephone Encounter (Signed)
sw pt to confirm 12/6 appt date/time per LOS. sw with radiology and pt pt is on the call back list to schedule CT scan

## 2016-06-05 ENCOUNTER — Ambulatory Visit (HOSPITAL_COMMUNITY): Admission: RE | Admit: 2016-06-05 | Payer: Medicare HMO | Source: Ambulatory Visit

## 2016-06-06 ENCOUNTER — Other Ambulatory Visit: Payer: Self-pay | Admitting: Internal Medicine

## 2016-06-06 ENCOUNTER — Other Ambulatory Visit (HOSPITAL_BASED_OUTPATIENT_CLINIC_OR_DEPARTMENT_OTHER): Payer: Medicare HMO

## 2016-06-06 ENCOUNTER — Ambulatory Visit (HOSPITAL_BASED_OUTPATIENT_CLINIC_OR_DEPARTMENT_OTHER): Payer: Medicare HMO

## 2016-06-06 VITALS — BP 126/77 | HR 100 | Temp 98.1°F | Resp 18

## 2016-06-06 DIAGNOSIS — C3411 Malignant neoplasm of upper lobe, right bronchus or lung: Secondary | ICD-10-CM

## 2016-06-06 DIAGNOSIS — K5900 Constipation, unspecified: Secondary | ICD-10-CM

## 2016-06-06 DIAGNOSIS — Z5111 Encounter for antineoplastic chemotherapy: Secondary | ICD-10-CM | POA: Diagnosis not present

## 2016-06-06 DIAGNOSIS — R5382 Chronic fatigue, unspecified: Secondary | ICD-10-CM | POA: Diagnosis not present

## 2016-06-06 DIAGNOSIS — Z5112 Encounter for antineoplastic immunotherapy: Secondary | ICD-10-CM

## 2016-06-06 LAB — COMPREHENSIVE METABOLIC PANEL
ALT: 12 U/L (ref 0–55)
AST: 10 U/L (ref 5–34)
Albumin: 2.7 g/dL — ABNORMAL LOW (ref 3.5–5.0)
Alkaline Phosphatase: 71 U/L (ref 40–150)
Anion Gap: 9 mEq/L (ref 3–11)
BUN: 13.4 mg/dL (ref 7.0–26.0)
CO2: 24 mEq/L (ref 22–29)
Calcium: 9.4 mg/dL (ref 8.4–10.4)
Chloride: 105 mEq/L (ref 98–109)
Creatinine: 0.8 mg/dL (ref 0.7–1.3)
EGFR: >90 mL/min/{1.73_m2} (ref >90)
Glucose: 126 mg/dl (ref 70–140)
Potassium: 3.7 mEq/L (ref 3.5–5.1)
Sodium: 138 mEq/L (ref 136–145)
Total Bilirubin: 0.27 mg/dL (ref 0.20–1.20)
Total Protein: 7 g/dL (ref 6.4–8.3)

## 2016-06-06 LAB — CBC WITH DIFFERENTIAL/PLATELET
BASO%: 0.2 % (ref 0.0–2.0)
Basophils Absolute: 0 10*3/uL (ref 0.0–0.1)
EOS%: 0 % (ref 0.0–7.0)
Eosinophils Absolute: 0 10*3/uL (ref 0.0–0.5)
HCT: 33.3 % — ABNORMAL LOW (ref 38.4–49.9)
HGB: 10.7 g/dL — ABNORMAL LOW (ref 13.0–17.1)
LYMPH%: 7 % — ABNORMAL LOW (ref 14.0–49.0)
MCH: 27.3 pg (ref 27.2–33.4)
MCHC: 32.3 g/dL (ref 32.0–36.0)
MCV: 84.4 fL (ref 79.3–98.0)
MONO#: 0.3 10*3/uL (ref 0.1–0.9)
MONO%: 5.3 % (ref 0.0–14.0)
NEUT#: 5 10*3/uL (ref 1.5–6.5)
NEUT%: 87.5 % — ABNORMAL HIGH (ref 39.0–75.0)
Platelets: 396 10*3/uL (ref 140–400)
RBC: 3.94 10*6/uL — ABNORMAL LOW (ref 4.20–5.82)
RDW: 18.4 % — ABNORMAL HIGH (ref 11.0–14.6)
WBC: 5.8 10*3/uL (ref 4.0–10.3)
lymph#: 0.4 10*3/uL — ABNORMAL LOW (ref 0.9–3.3)

## 2016-06-06 LAB — TSH: TSH: 0.212 m(IU)/L — ABNORMAL LOW (ref 0.320–4.118)

## 2016-06-06 MED ORDER — GEMCITABINE HCL CHEMO INJECTION 1 GM/26.3ML
1000.0000 mg/m2 | Freq: Once | INTRAVENOUS | Status: AC
  Administered 2016-06-06: 1710 mg via INTRAVENOUS
  Filled 2016-06-06: qty 44.97

## 2016-06-06 MED ORDER — PROCHLORPERAZINE MALEATE 10 MG PO TABS
ORAL_TABLET | ORAL | Status: AC
  Filled 2016-06-06: qty 1

## 2016-06-06 MED ORDER — SODIUM CHLORIDE 0.9 % IV SOLN
Freq: Once | INTRAVENOUS | Status: AC
  Administered 2016-06-06: 09:00:00 via INTRAVENOUS

## 2016-06-06 MED ORDER — PROCHLORPERAZINE MALEATE 10 MG PO TABS
10.0000 mg | ORAL_TABLET | Freq: Once | ORAL | Status: AC
  Administered 2016-06-06: 10 mg via ORAL

## 2016-06-06 NOTE — Patient Instructions (Signed)
[facility] Discharge Instructions for Patients Receiving Chemotherapy  Today you received the following chemotherapy agents Gemzar  To help prevent nausea and vomiting after your treatment, we encourage you to take your nausea medication as directed.    If you develop nausea and vomiting that is not controlled by your nausea medication, call the clinic.   BELOW ARE SYMPTOMS THAT SHOULD BE REPORTED IMMEDIATELY:  *FEVER GREATER THAN 100.5 F  *CHILLS WITH OR WITHOUT FEVER  NAUSEA AND VOMITING THAT IS NOT CONTROLLED WITH YOUR NAUSEA MEDICATION  *UNUSUAL SHORTNESS OF BREATH  *UNUSUAL BRUISING OR BLEEDING  TENDERNESS IN MOUTH AND THROAT WITH OR WITHOUT PRESENCE OF ULCERS  *URINARY PROBLEMS  *BOWEL PROBLEMS  UNUSUAL RASH Items with * indicate a potential emergency and should be followed up as soon as possible.  Feel free to call the clinic you have any questions or concerns. The clinic phone number is [phone].  Please show the CHEMO ALERT CARD at check-in to the Emergency Department and triage nurse.   

## 2016-06-07 ENCOUNTER — Telehealth: Payer: Self-pay | Admitting: Medical Oncology

## 2016-06-07 ENCOUNTER — Ambulatory Visit (HOSPITAL_COMMUNITY): Payer: Medicare HMO

## 2016-06-07 NOTE — Telephone Encounter (Signed)
Handicap placard ready for pickup

## 2016-06-08 ENCOUNTER — Ambulatory Visit (HOSPITAL_COMMUNITY): Payer: Medicare HMO

## 2016-06-13 ENCOUNTER — Other Ambulatory Visit (HOSPITAL_BASED_OUTPATIENT_CLINIC_OR_DEPARTMENT_OTHER): Payer: Medicare HMO

## 2016-06-13 ENCOUNTER — Other Ambulatory Visit: Payer: Self-pay | Admitting: Medical Oncology

## 2016-06-13 ENCOUNTER — Ambulatory Visit (HOSPITAL_BASED_OUTPATIENT_CLINIC_OR_DEPARTMENT_OTHER): Payer: Medicare HMO

## 2016-06-13 VITALS — BP 123/75 | HR 104 | Temp 97.7°F | Resp 18

## 2016-06-13 DIAGNOSIS — C3490 Malignant neoplasm of unspecified part of unspecified bronchus or lung: Secondary | ICD-10-CM

## 2016-06-13 DIAGNOSIS — C3411 Malignant neoplasm of upper lobe, right bronchus or lung: Secondary | ICD-10-CM

## 2016-06-13 DIAGNOSIS — Z5111 Encounter for antineoplastic chemotherapy: Secondary | ICD-10-CM

## 2016-06-13 DIAGNOSIS — E860 Dehydration: Secondary | ICD-10-CM

## 2016-06-13 LAB — CBC WITH DIFFERENTIAL/PLATELET
BASO%: 0.1 % (ref 0.0–2.0)
Basophils Absolute: 0 10*3/uL (ref 0.0–0.1)
EOS ABS: 0 10*3/uL (ref 0.0–0.5)
EOS%: 0.3 % (ref 0.0–7.0)
HEMATOCRIT: 35.7 % — AB (ref 38.4–49.9)
HGB: 11.2 g/dL — ABNORMAL LOW (ref 13.0–17.1)
LYMPH#: 0.6 10*3/uL — AB (ref 0.9–3.3)
LYMPH%: 22.2 % (ref 14.0–49.0)
MCH: 27.1 pg — ABNORMAL LOW (ref 27.2–33.4)
MCHC: 31.5 g/dL — ABNORMAL LOW (ref 32.0–36.0)
MCV: 86.1 fL (ref 79.3–98.0)
MONO#: 0.3 10*3/uL (ref 0.1–0.9)
MONO%: 9.1 % (ref 0.0–14.0)
NEUT%: 68.3 % (ref 39.0–75.0)
NEUTROS ABS: 2 10*3/uL (ref 1.5–6.5)
PLATELETS: 272 10*3/uL (ref 140–400)
RBC: 4.15 10*6/uL — ABNORMAL LOW (ref 4.20–5.82)
RDW: 17.9 % — ABNORMAL HIGH (ref 11.0–14.6)
WBC: 2.9 10*3/uL — AB (ref 4.0–10.3)

## 2016-06-13 LAB — COMPREHENSIVE METABOLIC PANEL
ALBUMIN: 2.8 g/dL — AB (ref 3.5–5.0)
ALK PHOS: 69 U/L (ref 40–150)
ALT: 15 U/L (ref 0–55)
ANION GAP: 8 meq/L (ref 3–11)
AST: 10 U/L (ref 5–34)
BILIRUBIN TOTAL: 0.22 mg/dL (ref 0.20–1.20)
BUN: 9.1 mg/dL (ref 7.0–26.0)
CALCIUM: 9.4 mg/dL (ref 8.4–10.4)
CO2: 26 mEq/L (ref 22–29)
CREATININE: 0.8 mg/dL (ref 0.7–1.3)
Chloride: 105 mEq/L (ref 98–109)
EGFR: >90 mL/min/{1.73_m2} (ref >90)
Glucose: 111 mg/dl (ref 70–140)
Potassium: 3.4 mEq/L — ABNORMAL LOW (ref 3.5–5.1)
Sodium: 140 mEq/L (ref 136–145)
TOTAL PROTEIN: 6.8 g/dL (ref 6.4–8.3)

## 2016-06-13 MED ORDER — GEMCITABINE HCL CHEMO INJECTION 1 GM/26.3ML
1000.0000 mg/m2 | Freq: Once | INTRAVENOUS | Status: AC
  Administered 2016-06-13: 1710 mg via INTRAVENOUS
  Filled 2016-06-13: qty 44.97

## 2016-06-13 MED ORDER — PROCHLORPERAZINE MALEATE 10 MG PO TABS
10.0000 mg | ORAL_TABLET | Freq: Once | ORAL | Status: AC
  Administered 2016-06-13: 10 mg via ORAL

## 2016-06-13 MED ORDER — HYDROCOD POLST-CPM POLST ER 10-8 MG/5ML PO SUER: 5.0000 mL | Freq: Two times a day (BID) | ORAL | 0 refills | Status: DC | PRN

## 2016-06-13 MED ORDER — SODIUM CHLORIDE 0.9 % IV SOLN
Freq: Once | INTRAVENOUS | Status: AC
  Administered 2016-06-13: 12:00:00 via INTRAVENOUS

## 2016-06-13 MED ORDER — OXYCODONE-ACETAMINOPHEN 5-325 MG PO TABS: 1.0000 | ORAL_TABLET | ORAL | 0 refills | Status: DC | PRN

## 2016-06-13 MED ORDER — PROCHLORPERAZINE MALEATE 10 MG PO TABS
ORAL_TABLET | ORAL | Status: AC
  Filled 2016-06-13: qty 1

## 2016-06-13 MED ORDER — SODIUM CHLORIDE 0.9 % IV SOLN
1000.0000 mL | Freq: Once | INTRAVENOUS | Status: AC
  Administered 2016-06-13: 1000 mL via INTRAVENOUS

## 2016-06-13 MED ORDER — PREDNISONE 10 MG PO TABS: 10.0000 mg | ORAL_TABLET | Freq: Every day | ORAL | 0 refills | Status: AC

## 2016-06-13 NOTE — Progress Notes (Signed)
Per Dr. [name] okay to treat with potassium 3.4 and heart rate 122.  Patient had two episodes of passing out at home, Dr. [name] aware and orders placed for patient to receive 1L normal saline over 2 hours. Per Dr. [name] and per pharmacy, okay to run fluids with gemzar. Patient aware and verbalized understanding.

## 2016-06-13 NOTE — Patient Instructions (Signed)
[facility] Discharge Instructions for Patients Receiving Chemotherapy  Today you received the following chemotherapy agents Gemzar  To help prevent nausea and vomiting after your treatment, we encourage you to take your nausea medication as directed.    If you develop nausea and vomiting that is not controlled by your nausea medication, call the clinic.   BELOW ARE SYMPTOMS THAT SHOULD BE REPORTED IMMEDIATELY:  *FEVER GREATER THAN 100.5 F  *CHILLS WITH OR WITHOUT FEVER  NAUSEA AND VOMITING THAT IS NOT CONTROLLED WITH YOUR NAUSEA MEDICATION  *UNUSUAL SHORTNESS OF BREATH  *UNUSUAL BRUISING OR BLEEDING  TENDERNESS IN MOUTH AND THROAT WITH OR WITHOUT PRESENCE OF ULCERS  *URINARY PROBLEMS  *BOWEL PROBLEMS  UNUSUAL RASH Items with * indicate a potential emergency and should be followed up as soon as possible.  Feel free to call the clinic you have any questions or concerns. The clinic phone number is [phone].  Please show the CHEMO ALERT CARD at check-in to the Emergency Department and triage nurse.   

## 2016-06-15 ENCOUNTER — Other Ambulatory Visit: Payer: Self-pay | Admitting: Internal Medicine

## 2016-06-15 ENCOUNTER — Other Ambulatory Visit: Payer: Self-pay | Admitting: *Deleted

## 2016-06-15 MED ORDER — PROCHLORPERAZINE MALEATE 10 MG PO TABS
10.0000 mg | ORAL_TABLET | Freq: Four times a day (QID) | ORAL | 0 refills | Status: DC | PRN
Start: 2016-06-15 — End: 2016-06-15

## 2016-06-20 ENCOUNTER — Telehealth: Payer: Self-pay | Admitting: *Deleted

## 2016-06-20 ENCOUNTER — Encounter: Payer: Medicare HMO | Admitting: Nurse Practitioner

## 2016-06-20 ENCOUNTER — Ambulatory Visit: Payer: Medicare HMO

## 2016-06-20 DIAGNOSIS — C7951 Secondary malignant neoplasm of bone: Secondary | ICD-10-CM

## 2016-06-20 DIAGNOSIS — C3411 Malignant neoplasm of upper lobe, right bronchus or lung: Secondary | ICD-10-CM

## 2016-06-20 NOTE — Telephone Encounter (Signed)
Wife left message that pt not coming to appt today .

## 2016-06-20 NOTE — Telephone Encounter (Signed)
"  I'm calling to request a CT head or something for my father.  He keeps falling.  My mother called me to let me know he hit his head.  The last time he hit his face and has a black eye.  Is he dehydrated or has the cancer spread to his head?  The falls started when he re-started chemotherapy.  He eats and drinks well.  No diarrhea or vomiting.  Takes medicine for nausea.  Mom's not able to check his B/P at home.  He recognizes [place] and not confused just keeps falling.  Call my mom [phone]."    Advised with a fall patients should report to ED.  "He does not want to go to the doctor and won't tell the doctor what's happening."   Will notify provider.  Advised he get up slowly and they should keep cane and walker near to encourage use.   Currently scheduled for CT Abdomen Pelvis on 06-27-2016 with [facility] lab/MD/Chemotherapy.

## 2016-06-20 NOTE — Telephone Encounter (Addendum)
Verbal order received and read back from Dr. [name] for report to [facility] for evaluation of fall or [facility].  Order given to spouse Enid Derry and scheduling message sent at this time.  Enid Derry asked for [facility].  Says they can arrive at 10:30 am.  Voicemail received "thanking for all we are doing but he doesn't want to come in.  Please cancel today's appointments."  Routed voicemail to [facility] and collaborative.  [name]

## 2016-06-22 ENCOUNTER — Telehealth: Payer: Self-pay | Admitting: Internal Medicine

## 2016-06-22 NOTE — Telephone Encounter (Signed)
Faxed record to [employer]

## 2016-06-27 ENCOUNTER — Telehealth: Payer: Self-pay | Admitting: *Deleted

## 2016-06-27 ENCOUNTER — Other Ambulatory Visit: Payer: Medicare HMO

## 2016-06-27 ENCOUNTER — Telehealth: Payer: Self-pay | Admitting: Internal Medicine

## 2016-06-27 ENCOUNTER — Ambulatory Visit (HOSPITAL_BASED_OUTPATIENT_CLINIC_OR_DEPARTMENT_OTHER): Payer: Medicare HMO | Admitting: Internal Medicine

## 2016-06-27 ENCOUNTER — Ambulatory Visit: Payer: Medicare HMO

## 2016-06-27 ENCOUNTER — Encounter: Payer: Self-pay | Admitting: Internal Medicine

## 2016-06-27 ENCOUNTER — Encounter (HOSPITAL_COMMUNITY): Payer: Self-pay

## 2016-06-27 ENCOUNTER — Other Ambulatory Visit (HOSPITAL_BASED_OUTPATIENT_CLINIC_OR_DEPARTMENT_OTHER): Payer: Medicare HMO

## 2016-06-27 ENCOUNTER — Ambulatory Visit (HOSPITAL_BASED_OUTPATIENT_CLINIC_OR_DEPARTMENT_OTHER): Payer: Medicare HMO

## 2016-06-27 ENCOUNTER — Ambulatory Visit (HOSPITAL_COMMUNITY)
Admission: RE | Admit: 2016-06-27 | Discharge: 2016-06-27 | Disposition: A | Discharge status: Home / Self Care | Payer: Medicare HMO | Source: Ambulatory Visit | Attending: Nurse Practitioner | Admitting: Nurse Practitioner

## 2016-06-27 VITALS — BP 122/98 | HR 66 | Temp 98.1°F | Resp 18 | Ht 66.0 in | Wt 137.0 lb

## 2016-06-27 DIAGNOSIS — R05 Cough: Secondary | ICD-10-CM | POA: Diagnosis not present

## 2016-06-27 DIAGNOSIS — C3411 Malignant neoplasm of upper lobe, right bronchus or lung: Secondary | ICD-10-CM

## 2016-06-27 DIAGNOSIS — R0602 Shortness of breath: Secondary | ICD-10-CM

## 2016-06-27 DIAGNOSIS — D6481 Anemia due to antineoplastic chemotherapy: Secondary | ICD-10-CM

## 2016-06-27 DIAGNOSIS — C782 Secondary malignant neoplasm of pleura: Secondary | ICD-10-CM | POA: Diagnosis not present

## 2016-06-27 DIAGNOSIS — R059 Cough, unspecified: Secondary | ICD-10-CM

## 2016-06-27 DIAGNOSIS — Z5111 Encounter for antineoplastic chemotherapy: Secondary | ICD-10-CM

## 2016-06-27 DIAGNOSIS — T451X5A Adverse effect of antineoplastic and immunosuppressive drugs, initial encounter: Secondary | ICD-10-CM

## 2016-06-27 DIAGNOSIS — C7951 Secondary malignant neoplasm of bone: Secondary | ICD-10-CM | POA: Diagnosis not present

## 2016-06-27 DIAGNOSIS — J90 Pleural effusion, not elsewhere classified: Secondary | ICD-10-CM | POA: Diagnosis not present

## 2016-06-27 DIAGNOSIS — E860 Dehydration: Secondary | ICD-10-CM

## 2016-06-27 DIAGNOSIS — E876 Hypokalemia: Secondary | ICD-10-CM

## 2016-06-27 HISTORY — DX: Anemia due to antineoplastic chemotherapy: D64.81

## 2016-06-27 HISTORY — DX: Cough, unspecified: R05.9

## 2016-06-27 HISTORY — DX: Shortness of breath: R06.02

## 2016-06-27 HISTORY — DX: Anemia due to antineoplastic chemotherapy: T45.1X5A

## 2016-06-27 LAB — CBC WITH DIFFERENTIAL/PLATELET
BASO%: 0.2 % (ref 0.0–2.0)
BASOS ABS: 0 10*3/uL (ref 0.0–0.1)
EOS%: 0.5 % (ref 0.0–7.0)
Eosinophils Absolute: 0 10*3/uL (ref 0.0–0.5)
HCT: 35.1 % — ABNORMAL LOW (ref 38.4–49.9)
HEMOGLOBIN: 11.1 g/dL — AB (ref 13.0–17.1)
LYMPH#: 1 10*3/uL (ref 0.9–3.3)
LYMPH%: 24.9 % (ref 14.0–49.0)
MCH: 27.5 pg (ref 27.2–33.4)
MCHC: 31.6 g/dL — AB (ref 32.0–36.0)
MCV: 86.9 fL (ref 79.3–98.0)
MONO#: 0.6 10*3/uL (ref 0.1–0.9)
MONO%: 14.5 % — ABNORMAL HIGH (ref 0.0–14.0)
NEUT#: 2.5 10*3/uL (ref 1.5–6.5)
NEUT%: 59.9 % (ref 39.0–75.0)
Platelets: 418 10*3/uL — ABNORMAL HIGH (ref 140–400)
RBC: 4.04 10*6/uL — ABNORMAL LOW (ref 4.20–5.82)
RDW: 17.7 % — ABNORMAL HIGH (ref 11.0–14.6)
WBC: 4.1 10*3/uL (ref 4.0–10.3)
nRBC: 0 % (ref 0–0)

## 2016-06-27 LAB — COMPREHENSIVE METABOLIC PANEL
ALBUMIN: 2.8 g/dL — AB (ref 3.5–5.0)
ALK PHOS: 69 U/L (ref 40–150)
ALT: 8 U/L (ref 0–55)
AST: 9 U/L (ref 5–34)
Anion Gap: 9 mEq/L (ref 3–11)
BUN: 11.7 mg/dL (ref 7.0–26.0)
CALCIUM: 9.2 mg/dL (ref 8.4–10.4)
CHLORIDE: 104 meq/L (ref 98–109)
CO2: 26 mEq/L (ref 22–29)
Creatinine: 0.9 mg/dL (ref 0.7–1.3)
GLUCOSE: 99 mg/dL (ref 70–140)
POTASSIUM: 3.2 meq/L — AB (ref 3.5–5.1)
SODIUM: 139 meq/L (ref 136–145)
Total Bilirubin: 0.23 mg/dL (ref 0.20–1.20)
Total Protein: 6.5 g/dL (ref 6.4–8.3)

## 2016-06-27 MED ORDER — GEMCITABINE HCL CHEMO INJECTION 1 GM/26.3ML
1000.0000 mg/m2 | Freq: Once | INTRAVENOUS | Status: AC
  Administered 2016-06-27: 1710 mg via INTRAVENOUS
  Filled 2016-06-27: qty 44.97

## 2016-06-27 MED ORDER — POTASSIUM CHLORIDE CRYS ER 20 MEQ PO TBCR: 20.0000 meq | EXTENDED_RELEASE_TABLET | Freq: Two times a day (BID) | ORAL | 0 refills | Status: AC

## 2016-06-27 MED ORDER — PROCHLORPERAZINE MALEATE 10 MG PO TABS
10.0000 mg | ORAL_TABLET | Freq: Once | ORAL | Status: AC
  Administered 2016-06-27: 10 mg via ORAL

## 2016-06-27 MED ORDER — PROCHLORPERAZINE MALEATE 10 MG PO TABS
ORAL_TABLET | ORAL | Status: AC
  Filled 2016-06-27: qty 1

## 2016-06-27 MED ORDER — SODIUM CHLORIDE 0.9 % IJ SOLN
10.0000 mL | INTRAMUSCULAR | Status: DC | PRN
  Filled 2016-06-27: qty 10

## 2016-06-27 MED ORDER — IOPAMIDOL (ISOVUE-300) INJECTION 61%
INTRAVENOUS | Status: AC
  Filled 2016-06-27: qty 100

## 2016-06-27 MED ORDER — HEPARIN SOD (PORK) LOCK FLUSH 100 UNIT/ML IV SOLN
250.0000 [IU] | Freq: Once | INTRAVENOUS | Status: DC | PRN
  Filled 2016-06-27: qty 5

## 2016-06-27 MED ORDER — SODIUM CHLORIDE 0.9 % IV SOLN
Freq: Once | INTRAVENOUS | Status: AC
  Administered 2016-06-27: 12:00:00 via INTRAVENOUS

## 2016-06-27 MED ORDER — METHYLPREDNISOLONE 4 MG PO TBPK: ORAL_TABLET | ORAL | 0 refills | Status: DC

## 2016-06-27 MED ORDER — IOPAMIDOL (ISOVUE-300) INJECTION 61%
100.0000 mL | Freq: Once | INTRAVENOUS | Status: AC | PRN
  Administered 2016-06-27: 100 mL via INTRAVENOUS

## 2016-06-27 NOTE — Progress Notes (Signed)
Pt  Refused lab draw he stated that his lab were already drawn today

## 2016-06-27 NOTE — Telephone Encounter (Signed)
Per LOS I have scheduled appts and gave calendar

## 2016-06-27 NOTE — Patient Instructions (Signed)
[facility] Discharge Instructions for Patients Receiving Chemotherapy  Today you received the following chemotherapy agents:  GEMZAR.  To help prevent nausea and vomiting after your treatment, we encourage you to take your nausea medication as directed.   If you develop nausea and vomiting that is not controlled by your nausea medication, call the clinic.   BELOW ARE SYMPTOMS THAT SHOULD BE REPORTED IMMEDIATELY:  *FEVER GREATER THAN 100.5 F  *CHILLS WITH OR WITHOUT FEVER  NAUSEA AND VOMITING THAT IS NOT CONTROLLED WITH YOUR NAUSEA MEDICATION  *UNUSUAL SHORTNESS OF BREATH  *UNUSUAL BRUISING OR BLEEDING  TENDERNESS IN MOUTH AND THROAT WITH OR WITHOUT PRESENCE OF ULCERS  *URINARY PROBLEMS  *BOWEL PROBLEMS  UNUSUAL RASH Items with * indicate a potential emergency and should be followed up as soon as possible.  Feel free to call the clinic you have any questions or concerns. The clinic phone number is [phone].  Please show the [place] at check-in to the Emergency Department and triage nurse.

## 2016-06-27 NOTE — Progress Notes (Signed)
[facility] [phone]   [phone]  OFFICE PROGRESS NOTE  [name], MD [place] [address]  DIAGNOSIS: Metastatic non-small cell lung cancer, adenocarcinoma initially diagnosed as a stage IIIa (T1b, N2, M0) presented with right upper lobe nodule in addition to mediastinal lymphadenopathy diagnosed in November 2016.  MOLECULAR STUDIES (Foundation one):  Genomic Alterations Identified? KRAS G12C STK11 Q220*   PRIOR THERAPY: 1) Concurrent chemoradiation with weekly carboplatin for AUC of 2 and paclitaxel 45 MG/M2. Status post 5 cycles. Last dose was given 07/18/2015 with partial response. 2) Consolidation chemotherapy with carboplatin for AUC of 5 and Alimta 500 MG/M2 every 3 weeks. First dose 10/06/2015. Status post 3 cycles. 3)  Immunotherapy with Nivolumab 240 MG IV every 2 weeks. First dose 12/28/2015. Status post 4 cycles. Last dose was giving 02/08/2016 discontinued secondary to disease progression.  CURRENT THERAPY: Systemic chemotherapy with gemcitabine 1000 MG/M2 on days 1 and 8 every 3 weeks status post 1 cycle.  INTERVAL HISTORY: Alan [name] 75 y.o. male returns to the clinic today for follow-up visit accompanied by his wife. The patient continues to complain of fatigue and generalized weakness. He tolerated the first cycle of his treatment with gemcitabine fairly well. He also complaining of dry cough and he is currently on Tussionex and Delsym with mild improvement. He denied having any fever or chills. He has no chest pain but continues to have shortness of breath at baseline and increased with exertion. He has no nausea, vomiting, diarrhea or constipation. He was supposed to have restaging CT scan of the chest, abdomen and pelvis before starting his systemic chemotherapy but this scan was performed only earlier today. He is here today for evaluation and discussion of the scan results and treatment  options.  MEDICAL HISTORY: Past Medical History:  Diagnosis Date  . Anxiety state, unspecified   . Benign hypertrophy of prostate   . Benign neoplasm of colon    colon polyps  . Cancer [facility])    prostate cancer  . Chronic fatigue 12/23/2015  . Constipation, chronic   . Dehydration 02/22/2016  . Diverticulosis of colon (without mention of hemorrhage)   . Dysuria 03/07/2016  . Encounter for antineoplastic chemotherapy 09/29/2015  . Encounter for antineoplastic immunotherapy 12/23/2015  . Erectile dysfunction   . GERD (gastroesophageal reflux disease)   . Glaucoma   . Hypercholesteremia   . Hypothyroidism   . Irritable bowel syndrome   . Left hip pain 10/27/2015  . Malignant pleural effusion    right  . Onychomycosis   . Radiation 06/20/15-08/02/15   right upper lung region 60 gray  . Restless legs syndrome (RLS)   . Sleep apnea    does not use cpap  . Unspecified essential hypertension     ALLERGIES:  is allergic to codeine.  MEDICATIONS:  Current Outpatient Prescriptions  Medication Sig Dispense Refill  . acetaminophen (TYLENOL) 500 MG tablet Take 1,000 mg by mouth every 4 (four) hours as needed for mild pain, moderate pain, fever or headache.     [name] aspirin EC 81 MG tablet Take 81 mg by mouth daily.     . brimonidine-timolol (COMBIGAN) 0.2-0.5 % ophthalmic solution Place 1 drop into both eyes every 12 (twelve) hours.    . chlorpheniramine-HYDROcodone (TUSSIONEX) 10-8 MG/5ML SUER Take 5 mLs by mouth every 12 (twelve) hours as needed for cough. 140 mL 0  . chlorproMAZINE (THORAZINE) 25 MG tablet Take 1 tablet (25  mg total) by mouth 3 (three) times daily as needed for hiccoughs. 30 tablet 0  . Cholecalciferol (VITAMIN D PO) Take 500 Units by mouth daily.    . finasteride ([name]) 5 MG tablet Take 1 tablet (5 mg total) by mouth daily. 30 tablet 2  . fluticasone (FLONASE) 50 MCG/ACT nasal spray Place 2 sprays into both nostrils daily as needed for allergies or rhinitis. 48 g 3  .  lactulose (CHRONULAC) 10 GM/15ML solution Take 10 g by mouth 2 (two) times daily as needed (constipation).     [name] levothyroxine (SYNTHROID, LEVOTHROID) 125 MCG tablet TAKE 1 TABLET BY MOUTH DAILY 30 tablet 0  . meloxicam (MOBIC) 7.5 MG tablet Take 1 tablet (7.5 mg total) by mouth daily. 30 tablet 1  . Multiple Vitamins-Minerals (CENTRUM SILVER 50+MEN) TABS Take 1 tablet by mouth daily.    [name] omeprazole (PRILOSEC) 20 MG capsule Take 1 capsule (20 mg total) by mouth every morning. 90 capsule 3  . oxyCODONE-acetaminophen (PERCOCET/ROXICET) 5-325 MG tablet Take 1 tablet by mouth every 4 (four) hours as needed for severe pain. 30 tablet 0  . predniSONE (DELTASONE) 10 MG tablet Take 1 tablet (10 mg total) by mouth daily with breakfast. 15 tablet 0  . prochlorperazine (COMPAZINE) 10 MG tablet TAKE 1 TABLET BY MOUTH EVERY 6 HOURS AS NEEDED FOR NAUSEA AND VOMITING 450 tablet 0  . senna-docusate (SENOKOT-S) 8.6-50 MG tablet Take 2 tablets by mouth 2 (two) times daily. 30 tablet 0  . sucralfate (CARAFATE) 1 g tablet Take 1 tablet (1 g total) by mouth 4 (four) times daily -  with meals and at bedtime. Mix in 2Tbs water, swish and swallow (Patient taking differently: Take 1 g by mouth 4 (four) times daily as needed (for stomach). Mix in 2Tbs water, swish and swallow) 60 tablet 0  . tamsulosin (FLOMAX) 0.4 MG CAPS capsule Take 2 capsules (0.8 mg total) by mouth at bedtime. 90 capsule 3  . dronabinol (MARINOL) 2.5 MG capsule Take 1 capsule (2.5 mg total) by mouth 2 (two) times daily before a meal. for appetite (Patient not taking: Reported on 06/27/2016) 60 capsule 0  . guaiFENesin (MUCINEX) 600 MG 12 hr tablet Take 2 tablets (1,200 mg total) by mouth 2 (two) times daily. (Patient not taking: Reported on 06/27/2016) 30 tablet 1  . LINZESS 290 MCG CAPS capsule TAKE ONE CAPSULE BY MOUTH ONCE DAILY (Patient not taking: Reported on 06/27/2016) 30 capsule 3  . mirtazapine (REMERON) 30 MG tablet Take 1 tablet (30 mg total)  by mouth at bedtime. (Patient not taking: Reported on 06/27/2016) 30 tablet 0  . sildenafil (VIAGRA) 100 MG tablet Take 1 tablet (100 mg total) by mouth as directed. (Patient not taking: Reported on 06/27/2016) 10 tablet 0  . simvastatin (ZOCOR) 20 MG tablet TAKE 1 TABLET BY MOUTH AT BEDTIME (Patient not taking: Reported on 06/27/2016) 90 tablet 0  . traMADol (ULTRAM) 50 MG tablet Take 1 tablet (50 mg total) by mouth every 6 (six) hours as needed for moderate pain. (Patient not taking: Reported on 06/27/2016) 30 tablet 0   No current facility-administered medications for this visit.    Facility-Administered Medications Ordered in Other Visits  Medication Dose Route Frequency Provider Last Rate Last Dose  . iopamidol (ISOVUE-300) 61 % injection             SURGICAL HISTORY:  Past Surgical History:  Procedure Laterality Date  . APPENDECTOMY  12/2012  . CARDIAC CATHETERIZATION  ? 2008  .  CHEST TUBE INSERTION Right 03/01/2016   Procedure: INSERTION PLEURAL DRAINAGE CATHETER with doxycycline pleurodesis;  Surgeon: Gaye Pollack, MD;  Location: [facility] OR;  Service: Thoracic;  Laterality: Right;  . COLONOSCOPY    . LAPAROSCOPIC APPENDECTOMY N/A 01/27/2013   Procedure: APPENDECTOMY LAPAROSCOPIC;  Surgeon: Edward Jolly, MD;  Location: [facility];  Service: General;  Laterality: N/A;  . MEDIASTINOSCOPY N/A 05/31/2015   Procedure: MEDIASTINOSCOPY;  Surgeon: Gaye Pollack, MD;  Location: [facility];  Service: Thoracic;  Laterality: N/A;  . none    . REMOVAL OF PLEURAL DRAINAGE CATHETER Right 04/04/2016   Procedure: REMOVAL OF PLEURAL DRAINAGE CATHETER;  Surgeon: Gaye Pollack, MD;  Location: [facility];  Service: Thoracic;  Laterality: Right;  . THYROIDECTOMY    . TONSILLECTOMY    . VIDEO BRONCHOSCOPY N/A 05/31/2015   Procedure: VIDEO BRONCHOSCOPY;  Surgeon: Gaye Pollack, MD;  Location: [facility] OR;  Service: Thoracic;  Laterality: N/A;    REVIEW OF SYSTEMS:  Constitutional: positive for anorexia, fatigue and  weight loss Eyes: negative Ears, nose, mouth, throat, and face: negative Respiratory: positive for cough and dyspnea on exertion Cardiovascular: negative Gastrointestinal: negative Genitourinary:negative Integument/breast: negative Hematologic/lymphatic: negative Musculoskeletal:positive for muscle weakness Neurological: negative Behavioral/Psych: negative Endocrine: negative Allergic/Immunologic: negative   PHYSICAL EXAMINATION: General appearance: alert, cooperative, fatigued and no distress Head: Normocephalic, without obvious abnormality, atraumatic Neck: no adenopathy, no JVD, supple, symmetrical, trachea midline and thyroid not enlarged, symmetric, no tenderness/mass/nodules Lymph nodes: Cervical, supraclavicular, and axillary nodes normal. Resp: rales bilaterally and wheezes bilaterally Back: symmetric, no curvature. ROM normal. No CVA tenderness. Cardio: regular rate and rhythm, S1, S2 normal, no murmur, click, rub or gallop GI: soft, non-tender; bowel sounds normal; no masses,  no organomegaly Extremities: extremities normal, atraumatic, no cyanosis or edema Neurologic: Alert and oriented X 3, normal strength and tone. Normal symmetric reflexes. Normal coordination and gait  ECOG PERFORMANCE STATUS: 2 - Symptomatic, <50% confined to bed  Blood pressure (!) 122/98, pulse 66, temperature 98.1 F (36.7 C), temperature source Oral, resp. rate 18, height '5\' 6"'  (1.676 m), weight 137 lb (62.1 kg), SpO2 99 %.  LABORATORY DATA: Lab Results  Component Value Date   WBC 4.1 06/27/2016   HGB 11.1 (L) 06/27/2016   HCT 35.1 (L) 06/27/2016   MCV 86.9 06/27/2016   PLT 418 (H) 06/27/2016      Chemistry      Component Value Date/Time   NA 139 06/27/2016 0810   K 3.2 (L) 06/27/2016 0810   CL 100 (L) 03/10/2016 2110   CO2 26 06/27/2016 0810   BUN 11.7 06/27/2016 0810   CREATININE 0.9 06/27/2016 0810      Component Value Date/Time   CALCIUM 9.2 06/27/2016 0810   ALKPHOS 69  06/27/2016 0810   AST 9 06/27/2016 0810   ALT 8 06/27/2016 0810   BILITOT 0.23 06/27/2016 0810       RADIOGRAPHIC STUDIES: Ct Chest W Contrast  Result Date: 06/27/2016 CLINICAL DATA:  Right upper lobe lung carcinoma. Undergoing chemotherapy. Restaging. Previous radiation therapy. Worsening cough and nausea. Prostate carcinoma. EXAM: CT CHEST, ABDOMEN, AND PELVIS WITH CONTRAST TECHNIQUE: Multidetector CT imaging of the chest, abdomen and pelvis was performed following the standard protocol during bolus administration of intravenous contrast. CONTRAST:  174m ISOVUE-300 IOPAMIDOL (ISOVUE-300) INJECTION 61% COMPARISON:  Chest CT on 02/13/2016 and PET-CT on 12/19/2015 FINDINGS: CT CHEST FINDINGS Cardiovascular: No acute findings. Mediastinum/Lymph Nodes: Increase mediastinal lymphadenopathy in the subcarinal region, currently measuring 2.4 cm on  image 30/2 compared to 1.2 cm previously. Increased right hilar lymphadenopathy measuring 3.1 cm on image 27/2, which is difficult to compare with previous study given large right pleural effusion and atelectasis. There is also new mild left hilar lymphadenopathy measuring 1.0 cm on image 26/2. Lungs/Pleura: Near complete resolution of right pleural effusion is seen since previous study with decreased right lung atelectasis. Increased enhancing soft tissue nodularity is seen in the right pleural space, consistent with pleural metastases. There is also asymmetric pulmonary interstitial prominence mainly in the right lower lobe, suspicious for lymphangitic spread of carcinoma. Increased size of mass in the medial right upper lobe, currently measuring 7.4 x 4.2 cm on image 21/2 compared to 2.8 x 2.4 cm previously. Increased number and size of bilateral pulmonary nodules compared previous study, consistent with diffuse pulmonary metastases. Tiny left pleural effusion is decreased in size since prior exam. Musculoskeletal: Increased sclerosis seen at previous site of  lytic metastasis in the T9 vertebral body. No new osseous metastatic lesions identified. CT ABDOMEN AND PELVIS FINDINGS Hepatobiliary: No masses identified. Gallbladder is unremarkable. Pancreas:  No mass or inflammatory changes. Spleen:  Within normal limits in size and appearance. Adrenals/Urinary tract: No evidence of adrenal or renal masses. Small bilateral renal cysts noted. No evidence of renal mass or hydronephrosis. Stable diffuse bladder wall thickening, likely due to chronic bladder outlet obstruction. Stomach/Bowel: No evidence of obstruction, inflammatory process, or abnormal fluid collections. Vascular/Lymphatic: No pathologically enlarged lymph nodes identified. No abdominal aortic aneurysm. Aortic atherosclerosis. Reproductive: Stable enlarged prostate with median lobe hypertrophy indenting the bladder base. Other:  Minimal free fluid in dependent portion of pelvis. Musculoskeletal: Increased sclerosis is seen at previous sites of lytic metastases in the T9 vertebral body and right superior pubic ramus, consistent with healing bone metastases. No new lytic metastases identified. IMPRESSION: Significant decrease in right pleural effusion and compressive atelectasis since prior study. Increased right pleural metastatic disease, and probable lymphangitic spread of carcinoma in the right lower lung. Increased size of medial right upper lobe mass, and increased bilateral pulmonary metastases. Increased mediastinal and bilateral hilar lymphadenopathy. No evidence of soft tissue metastatic disease within the abdomen or pelvis. Interval healing response of lytic bone metastases in the T9 vertebral body and right superior pubic ramus. No new or progressive osseous metastatic disease identified. Electronically Signed   By: Earle Gell M.D.   On: 06/27/2016 09:01   Ct Abdomen Pelvis W Contrast  Result Date: 06/27/2016 CLINICAL DATA:  Right upper lobe lung carcinoma. Undergoing chemotherapy. Restaging.  Previous radiation therapy. Worsening cough and nausea. Prostate carcinoma. EXAM: CT CHEST, ABDOMEN, AND PELVIS WITH CONTRAST TECHNIQUE: Multidetector CT imaging of the chest, abdomen and pelvis was performed following the standard protocol during bolus administration of intravenous contrast. CONTRAST:  156m ISOVUE-300 IOPAMIDOL (ISOVUE-300) INJECTION 61% COMPARISON:  Chest CT on 02/13/2016 and PET-CT on 12/19/2015 FINDINGS: CT CHEST FINDINGS Cardiovascular: No acute findings. Mediastinum/Lymph Nodes: Increase mediastinal lymphadenopathy in the subcarinal region, currently measuring 2.4 cm on image 30/2 compared to 1.2 cm previously. Increased right hilar lymphadenopathy measuring 3.1 cm on image 27/2, which is difficult to compare with previous study given large right pleural effusion and atelectasis. There is also new mild left hilar lymphadenopathy measuring 1.0 cm on image 26/2. Lungs/Pleura: Near complete resolution of right pleural effusion is seen since previous study with decreased right lung atelectasis. Increased enhancing soft tissue nodularity is seen in the right pleural space, consistent with pleural metastases. There is also asymmetric pulmonary interstitial prominence  mainly in the right lower lobe, suspicious for lymphangitic spread of carcinoma. Increased size of mass in the medial right upper lobe, currently measuring 7.4 x 4.2 cm on image 21/2 compared to 2.8 x 2.4 cm previously. Increased number and size of bilateral pulmonary nodules compared previous study, consistent with diffuse pulmonary metastases. Tiny left pleural effusion is decreased in size since prior exam. Musculoskeletal: Increased sclerosis seen at previous site of lytic metastasis in the T9 vertebral body. No new osseous metastatic lesions identified. CT ABDOMEN AND PELVIS FINDINGS Hepatobiliary: No masses identified. Gallbladder is unremarkable. Pancreas:  No mass or inflammatory changes. Spleen:  Within normal limits in size  and appearance. Adrenals/Urinary tract: No evidence of adrenal or renal masses. Small bilateral renal cysts noted. No evidence of renal mass or hydronephrosis. Stable diffuse bladder wall thickening, likely due to chronic bladder outlet obstruction. Stomach/Bowel: No evidence of obstruction, inflammatory process, or abnormal fluid collections. Vascular/Lymphatic: No pathologically enlarged lymph nodes identified. No abdominal aortic aneurysm. Aortic atherosclerosis. Reproductive: Stable enlarged prostate with median lobe hypertrophy indenting the bladder base. Other:  Minimal free fluid in dependent portion of pelvis. Musculoskeletal: Increased sclerosis is seen at previous sites of lytic metastases in the T9 vertebral body and right superior pubic ramus, consistent with healing bone metastases. No new lytic metastases identified. IMPRESSION: Significant decrease in right pleural effusion and compressive atelectasis since prior study. Increased right pleural metastatic disease, and probable lymphangitic spread of carcinoma in the right lower lung. Increased size of medial right upper lobe mass, and increased bilateral pulmonary metastases. Increased mediastinal and bilateral hilar lymphadenopathy. No evidence of soft tissue metastatic disease within the abdomen or pelvis. Interval healing response of lytic bone metastases in the T9 vertebral body and right superior pubic ramus. No new or progressive osseous metastatic disease identified. Electronically Signed   By: Earle Gell M.D.   On: 06/27/2016 09:01    ASSESSMENT AND PLAN: This is a very pleasant 75 years old African-American male with: 1) metastatic non-small cell lung cancer, adenocarcinoma: He is status post several chemotherapy regimens as well as palliative radiation. His currently on treatment with single agent gemcitabine status post 1 cycle. The patient has CT scan of the chest, abdomen and pelvis performed earlier today as a baseline for his  treatment with the gemcitabine. I personally and independently reviewed the scan images and discuss the results and showed the images to the patient and his wife today. Unfortunately the scan showed evidence for disease progression but this is compared to previous study several months ago. He does not indicate any response to the treatment with gemcitabine at this point since he received only 2 doses. I strongly recommended for the patient to continue his current treatment with gemcitabine. We will proceed with 2 more cycles of the chemotherapy before repeating imaging studies for evaluation of his response to this treatment. The patient and his wife agreed to the current plan. He will proceed with day 1 of cycle #2 today. He would come back for follow-up visit in 3 weeks with the start of cycle #3. 2) shortness of breath and dry cough: Likely secondary to his disease progression. There is no evidence for pulmonary embolism in the right pleural effusion has significantly improved. I recommended for the patient to continue with the symptomatic management for the cough with Tussionex and Delsym. I will also start the patient on Medrol Dosepak. 3) chemotherapy-induced anemia: Will continue to monitor for now on consider the patient for PRBCs transfusion as  needed. 4) hypokalemia: I strongly encouraged the patient to increase his potassium rich diet and we will send a prescription to his pharmacy with KCl 20 mEq by mouth daily for 7 days. He was advised to call immediately if he has any concerning symptoms in the interval. The patient voices understanding of current disease status and treatment options and is in agreement with the current care plan.  All questions were answered. The patient knows to call the clinic with any problems, questions or concerns. We can certainly see the patient much sooner if necessary.  Disclaimer: This note was dictated with voice recognition software. Similar sounding words  can inadvertently be transcribed and may not be corrected upon review.

## 2016-06-27 NOTE — Telephone Encounter (Signed)
Appointments scheduled per 12/27 LOS. Patient given AVS report and calendars with future scheduled appointments.

## 2016-07-01 NOTE — Congregational Nurse Program (Signed)
Congregational Nurse Program Note  Date of Encounter: 07/01/2016  Past Medical History: Past Medical History:  Diagnosis Date  . 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 Anxiety state, unspecified   . Benign hypertrophy of prostate   . Benign neoplasm of colon    colon polyps  . Cancer [facility])    prostate cancer  . Chronic fatigue 12/23/2015  . Constipation, chronic   . Cough 06/27/2016  . Dehydration 02/22/2016  . Diverticulosis of colon (without mention of hemorrhage)   . Dysuria 03/07/2016  . Encounter for antineoplastic chemotherapy 09/29/2015  . Encounter for antineoplastic immunotherapy 12/23/2015  . Erectile dysfunction   . GERD (gastroesophageal reflux disease)   . Glaucoma   . Hypercholesteremia   . Hypothyroidism   . Irritable bowel syndrome   . Left hip pain 10/27/2015  . Malignant pleural effusion    right  . Onychomycosis   . Radiation 06/20/15-08/02/15   right upper lung region 60 gray  . Restless legs syndrome (RLS)   . Shortness of breath at rest 06/27/2016  . Sleep apnea    does not use cpap  . Unspecified essential hypertension     Encounter Details:     CNP Questionnaire - 07/01/16 1824      Patient Demographics   Is this a new or existing patient? Existing   Patient is considered a/an Not Applicable   Race African-American/Black     Patient Assistance   Location of Patient Assistance [employer]   Patient's financial/insurance status Low Income;Medicare   Uninsured Patient ([facility]) No   Patient referred to apply for the following financial assistance Not Applicable   Food insecurities addressed Not Applicable   Transportation assistance No   Assistance securing medications No   Educational health offerings Cancer;Spiritual care;Chronic disease     Encounter Details   Primary purpose of visit Education/Health Concerns;Spiritual Care/Support Visit   Was an Emergency Department visit averted? No   Does  patient have a medical provider? Yes   Patient referred to Not Applicable   Was a mental health screening completed? (GAINS tool) No   Does patient have dental issues? No   Does patient have vision issues? Yes   Was a vision referral made? No   Does your patient have an abnormal blood pressure today? No   Since previous encounter, have you referred patient for abnormal blood pressure that resulted in a new diagnosis or medication change? No   Does your patient have an abnormal blood glucose today? No   Since previous encounter, have you referred patient for abnormal blood glucose that resulted in a new diagnosis or medication change? No   Was there a life-saving intervention made? 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  Encouraged members to have a living will in place and to get life insurance for funeral and burial.  Client still having spells of coughing.  Seems more energetic this morning.

## 2016-07-03 ENCOUNTER — Telehealth: Payer: Self-pay | Admitting: Medical Oncology

## 2016-07-03 DIAGNOSIS — C3411 Malignant neoplasm of upper lobe, right bronchus or lung: Secondary | ICD-10-CM

## 2016-07-03 DIAGNOSIS — C3490 Malignant neoplasm of unspecified part of unspecified bronchus or lung: Secondary | ICD-10-CM

## 2016-07-03 DIAGNOSIS — E860 Dehydration: Secondary | ICD-10-CM

## 2016-07-03 MED ORDER — PROCHLORPERAZINE MALEATE 10 MG PO TABS: ORAL_TABLET | ORAL | 0 refills | Status: AC

## 2016-07-03 MED ORDER — OXYCODONE-ACETAMINOPHEN 5-325 MG PO TABS: 1.0000 | ORAL_TABLET | ORAL | 0 refills | Status: AC | PRN

## 2016-07-03 MED ORDER — HYDROCOD POLST-CPM POLST ER 10-8 MG/5ML PO SUER: 5.0000 mL | Freq: Two times a day (BID) | ORAL | 0 refills | Status: DC | PRN

## 2016-07-03 NOTE — Telephone Encounter (Signed)
Requests refills -ready for pick up

## 2016-07-04 ENCOUNTER — Ambulatory Visit (HOSPITAL_BASED_OUTPATIENT_CLINIC_OR_DEPARTMENT_OTHER): Payer: Medicare HMO

## 2016-07-04 ENCOUNTER — Other Ambulatory Visit (HOSPITAL_BASED_OUTPATIENT_CLINIC_OR_DEPARTMENT_OTHER): Payer: Medicare HMO

## 2016-07-04 VITALS — BP 134/91 | HR 97 | Temp 98.2°F | Resp 16

## 2016-07-04 DIAGNOSIS — Z5111 Encounter for antineoplastic chemotherapy: Secondary | ICD-10-CM

## 2016-07-04 DIAGNOSIS — C7951 Secondary malignant neoplasm of bone: Secondary | ICD-10-CM

## 2016-07-04 DIAGNOSIS — C3411 Malignant neoplasm of upper lobe, right bronchus or lung: Secondary | ICD-10-CM | POA: Diagnosis not present

## 2016-07-04 LAB — CBC WITH DIFFERENTIAL/PLATELET
BASO%: 0.1 % (ref 0.0–2.0)
Basophils Absolute: 0 10*3/uL (ref 0.0–0.1)
EOS ABS: 0 10*3/uL (ref 0.0–0.5)
EOS%: 0 % (ref 0.0–7.0)
HCT: 34.1 % — ABNORMAL LOW (ref 38.4–49.9)
HEMOGLOBIN: 11.3 g/dL — AB (ref 13.0–17.1)
LYMPH%: 14.1 % (ref 14.0–49.0)
MCH: 28.4 pg (ref 27.2–33.4)
MCHC: 33 g/dL (ref 32.0–36.0)
MCV: 85.9 fL (ref 79.3–98.0)
MONO#: 0.4 10*3/uL (ref 0.1–0.9)
MONO%: 9.9 % (ref 0.0–14.0)
NEUT#: 2.8 10*3/uL (ref 1.5–6.5)
NEUT%: 75.9 % — ABNORMAL HIGH (ref 39.0–75.0)
PLATELETS: 414 10*3/uL — AB (ref 140–400)
RBC: 3.97 10*6/uL — AB (ref 4.20–5.82)
RDW: 17.8 % — AB (ref 11.0–14.6)
WBC: 3.7 10*3/uL — ABNORMAL LOW (ref 4.0–10.3)
lymph#: 0.5 10*3/uL — ABNORMAL LOW (ref 0.9–3.3)

## 2016-07-04 LAB — COMPREHENSIVE METABOLIC PANEL
ALT: 8 U/L (ref 0–55)
ANION GAP: 11 meq/L (ref 3–11)
AST: 10 U/L (ref 5–34)
Albumin: 3.2 g/dL — ABNORMAL LOW (ref 3.5–5.0)
Alkaline Phosphatase: 70 U/L (ref 40–150)
BUN: 13.6 mg/dL (ref 7.0–26.0)
CALCIUM: 9.7 mg/dL (ref 8.4–10.4)
CHLORIDE: 106 meq/L (ref 98–109)
CO2: 22 meq/L (ref 22–29)
Creatinine: 0.9 mg/dL (ref 0.7–1.3)
EGFR: >90 mL/min/{1.73_m2} (ref >90)
Glucose: 117 mg/dl (ref 70–140)
Potassium: 3.6 mEq/L (ref 3.5–5.1)
Sodium: 139 mEq/L (ref 136–145)
TOTAL PROTEIN: 6.9 g/dL (ref 6.4–8.3)

## 2016-07-04 MED ORDER — SODIUM CHLORIDE 0.9 % IV SOLN
Freq: Once | INTRAVENOUS | Status: AC
  Administered 2016-07-04: 13:00:00 via INTRAVENOUS

## 2016-07-04 MED ORDER — PROCHLORPERAZINE MALEATE 10 MG PO TABS
ORAL_TABLET | ORAL | Status: AC
  Filled 2016-07-04: qty 1

## 2016-07-04 MED ORDER — SODIUM CHLORIDE 0.9 % IV SOLN
1000.0000 mg/m2 | Freq: Once | INTRAVENOUS | Status: AC
  Administered 2016-07-04: 1710 mg via INTRAVENOUS
  Filled 2016-07-04: qty 44.97

## 2016-07-04 MED ORDER — PROCHLORPERAZINE MALEATE 10 MG PO TABS
10.0000 mg | ORAL_TABLET | Freq: Once | ORAL | Status: AC
  Administered 2016-07-04: 10 mg via ORAL

## 2016-07-04 NOTE — Patient Instructions (Signed)
[facility] Discharge Instructions for Patients Receiving Chemotherapy  Today you received the following chemotherapy agents Gemzar  To help prevent nausea and vomiting after your treatment, we encourage you to take your nausea medication as directed.    If you develop nausea and vomiting that is not controlled by your nausea medication, call the clinic.   BELOW ARE SYMPTOMS THAT SHOULD BE REPORTED IMMEDIATELY:  *FEVER GREATER THAN 100.5 F  *CHILLS WITH OR WITHOUT FEVER  NAUSEA AND VOMITING THAT IS NOT CONTROLLED WITH YOUR NAUSEA MEDICATION  *UNUSUAL SHORTNESS OF BREATH  *UNUSUAL BRUISING OR BLEEDING  TENDERNESS IN MOUTH AND THROAT WITH OR WITHOUT PRESENCE OF ULCERS  *URINARY PROBLEMS  *BOWEL PROBLEMS  UNUSUAL RASH Items with * indicate a potential emergency and should be followed up as soon as possible.  Feel free to call the clinic you have any questions or concerns. The clinic phone number is [phone].  Please show the CHEMO ALERT CARD at check-in to the Emergency Department and triage nurse.   

## 2016-07-09 ENCOUNTER — Telehealth: Payer: Self-pay | Admitting: Medical Oncology

## 2016-07-09 ENCOUNTER — Other Ambulatory Visit: Payer: Self-pay | Admitting: Medical Oncology

## 2016-07-09 DIAGNOSIS — C3490 Malignant neoplasm of unspecified part of unspecified bronchus or lung: Secondary | ICD-10-CM

## 2016-07-09 MED ORDER — PREDNISONE 10 MG PO TABS: 10.0000 mg | ORAL_TABLET | Freq: Every day | ORAL | 0 refills | Status: DC

## 2016-07-09 NOTE — Telephone Encounter (Signed)
Requests refill for "prednisone" 10 mg day- started at [facility].

## 2016-07-09 NOTE — Telephone Encounter (Signed)
Okay to refill? 

## 2016-07-16 ENCOUNTER — Other Ambulatory Visit: Payer: Self-pay | Admitting: Medical Oncology

## 2016-07-16 DIAGNOSIS — C3411 Malignant neoplasm of upper lobe, right bronchus or lung: Secondary | ICD-10-CM

## 2016-07-17 ENCOUNTER — Telehealth: Payer: Self-pay | Admitting: *Deleted

## 2016-07-17 ENCOUNTER — Encounter: Payer: Self-pay | Admitting: Internal Medicine

## 2016-07-17 ENCOUNTER — Other Ambulatory Visit (HOSPITAL_BASED_OUTPATIENT_CLINIC_OR_DEPARTMENT_OTHER): Payer: Medicare HMO

## 2016-07-17 ENCOUNTER — Ambulatory Visit (HOSPITAL_BASED_OUTPATIENT_CLINIC_OR_DEPARTMENT_OTHER): Payer: Medicare HMO | Admitting: Internal Medicine

## 2016-07-17 ENCOUNTER — Telehealth: Payer: Self-pay | Admitting: Internal Medicine

## 2016-07-17 ENCOUNTER — Ambulatory Visit (HOSPITAL_BASED_OUTPATIENT_CLINIC_OR_DEPARTMENT_OTHER): Payer: Medicare HMO

## 2016-07-17 VITALS — BP 102/67 | HR 94 | Temp 97.8°F | Resp 18 | Ht 66.0 in | Wt 136.8 lb

## 2016-07-17 DIAGNOSIS — Z5111 Encounter for antineoplastic chemotherapy: Secondary | ICD-10-CM | POA: Diagnosis not present

## 2016-07-17 DIAGNOSIS — C3411 Malignant neoplasm of upper lobe, right bronchus or lung: Secondary | ICD-10-CM

## 2016-07-17 DIAGNOSIS — R05 Cough: Secondary | ICD-10-CM

## 2016-07-17 DIAGNOSIS — G893 Neoplasm related pain (acute) (chronic): Secondary | ICD-10-CM | POA: Diagnosis not present

## 2016-07-17 DIAGNOSIS — E46 Unspecified protein-calorie malnutrition: Secondary | ICD-10-CM

## 2016-07-17 DIAGNOSIS — E890 Postprocedural hypothyroidism: Secondary | ICD-10-CM | POA: Diagnosis not present

## 2016-07-17 DIAGNOSIS — R059 Cough, unspecified: Secondary | ICD-10-CM

## 2016-07-17 DIAGNOSIS — F329 Major depressive disorder, single episode, unspecified: Secondary | ICD-10-CM

## 2016-07-17 LAB — COMPREHENSIVE METABOLIC PANEL
ALBUMIN: 2.7 g/dL — AB (ref 3.5–5.0)
ALT: 8 U/L (ref 0–55)
AST: 10 U/L (ref 5–34)
Alkaline Phosphatase: 65 U/L (ref 40–150)
Anion Gap: 7 mEq/L (ref 3–11)
BUN: 14.9 mg/dL (ref 7.0–26.0)
CO2: 27 meq/L (ref 22–29)
Calcium: 9.2 mg/dL (ref 8.4–10.4)
Chloride: 107 mEq/L (ref 98–109)
Creatinine: 1 mg/dL (ref 0.7–1.3)
EGFR: 85 mL/min/{1.73_m2} — AB (ref >90)
GLUCOSE: 114 mg/dL (ref 70–140)
Potassium: 3.9 mEq/L (ref 3.5–5.1)
SODIUM: 141 meq/L (ref 136–145)
TOTAL PROTEIN: 6.2 g/dL — AB (ref 6.4–8.3)

## 2016-07-17 LAB — CBC WITH DIFFERENTIAL/PLATELET
BASO%: 0.5 % (ref 0.0–2.0)
Basophils Absolute: 0 10*3/uL (ref 0.0–0.1)
EOS%: 0.3 % (ref 0.0–7.0)
Eosinophils Absolute: 0 10*3/uL (ref 0.0–0.5)
HCT: 36.5 % — ABNORMAL LOW (ref 38.4–49.9)
HEMOGLOBIN: 11.6 g/dL — AB (ref 13.0–17.1)
LYMPH%: 11.1 % — AB (ref 14.0–49.0)
MCH: 28 pg (ref 27.2–33.4)
MCHC: 31.8 g/dL — ABNORMAL LOW (ref 32.0–36.0)
MCV: 88 fL (ref 79.3–98.0)
MONO#: 0.6 10*3/uL (ref 0.1–0.9)
MONO%: 16.7 % — AB (ref 0.0–14.0)
NEUT%: 71.4 % (ref 39.0–75.0)
NEUTROS ABS: 2.7 10*3/uL (ref 1.5–6.5)
Platelets: 334 10*3/uL (ref 140–400)
RBC: 4.15 10*6/uL — AB (ref 4.20–5.82)
RDW: 18.5 % — AB (ref 11.0–14.6)
WBC: 3.8 10*3/uL — AB (ref 4.0–10.3)
lymph#: 0.4 10*3/uL — ABNORMAL LOW (ref 0.9–3.3)

## 2016-07-17 MED ORDER — SODIUM CHLORIDE 0.9 % IV SOLN
Freq: Once | INTRAVENOUS | Status: AC
  Administered 2016-07-17: 12:00:00 via INTRAVENOUS

## 2016-07-17 MED ORDER — PROCHLORPERAZINE MALEATE 10 MG PO TABS
10.0000 mg | ORAL_TABLET | Freq: Once | ORAL | Status: AC
  Administered 2016-07-17: 10 mg via ORAL

## 2016-07-17 MED ORDER — SODIUM CHLORIDE 0.9 % IV SOLN
1000.0000 mg/m2 | Freq: Once | INTRAVENOUS | Status: AC
  Administered 2016-07-17: 1710 mg via INTRAVENOUS
  Filled 2016-07-17: qty 44.97

## 2016-07-17 MED ORDER — PROCHLORPERAZINE MALEATE 10 MG PO TABS
ORAL_TABLET | ORAL | Status: AC
  Filled 2016-07-17: qty 1

## 2016-07-17 NOTE — Telephone Encounter (Signed)
Per 1/16 LOS I have scheduled appts and gave calendar

## 2016-07-17 NOTE — Progress Notes (Signed)
[place] [phone]   [phone]  OFFICE PROGRESS NOTE  [name], MD [place] [address]  DIAGNOSIS: Metastatic non-small cell lung cancer, adenocarcinoma initially diagnosed as a stage IIIA (T1b, N2, M0) presented with right upper lobe lung nodule in addition to mediastinal lymphadenopathy diagnosed in November 2016.  MOLECULAR STUDIES (Foundation one):  Genomic Alterations Identified? KRAS G12C STK11 Q220*  PRIOR THERAPY: 1) Concurrent chemoradiation with weekly carboplatin for AUC of 2 and paclitaxel 45 MG/M2. Status post 5 cycles. Last dose was given 07/18/2015 with partial response. 2) Consolidation chemotherapy with carboplatin for AUC of 5 and Alimta 500 MG/M2 every 3 weeks. First dose 10/06/2015. Status post 3 cycles. 3) Immunotherapy with Nivolumab 240 MG IV every 2 weeks. First dose 12/28/2015. Status post 4 cycles. Last dose was giving 02/08/2016 discontinued secondary to disease progression.  CURRENT THERAPY: Systemic chemotherapy with gemcitabine 1000 MG/M2 on days 1 and 8 every 3 weeks status post 2 cycles.  INTERVAL HISTORY: Alan [name] 76 y.o. male returns to the clinic today for follow-up visit accompanied by his wife. The patient continues to have persistent cough and he is currently alternating Hycodan with Dylsem as needed for cough. He continues to have baseline shortness of breath. He is tolerating his current treatment with single agent gemcitabine well. He denied having any significant weight loss or night sweats. He feels a little bit better compared to a few weeks ago. He has no nausea, vomiting, diarrhea or constipation. He denied having any headache or visual changes. He had repeat CT scan of the chest, abdomen and pelvis performed a few weeks ago and he is here for evaluation and discussion of his scan results and treatment options.  MEDICAL HISTORY: Past Medical History:  Diagnosis Date  .  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 Anxiety state, unspecified   . Benign hypertrophy of prostate   . Benign neoplasm of colon    colon polyps  . Cancer [facility])    prostate cancer  . Chronic fatigue 12/23/2015  . Constipation, chronic   . Cough 06/27/2016  . Dehydration 02/22/2016  . Diverticulosis of colon (without mention of hemorrhage)   . Dysuria 03/07/2016  . Encounter for antineoplastic chemotherapy 09/29/2015  . Encounter for antineoplastic immunotherapy 12/23/2015  . Erectile dysfunction   . GERD (gastroesophageal reflux disease)   . Glaucoma   . Hypercholesteremia   . Hypothyroidism   . Irritable bowel syndrome   . Left hip pain 10/27/2015  . Malignant pleural effusion    right  . Onychomycosis   . Radiation 06/20/15-08/02/15   right upper lung region 60 gray  . Restless legs syndrome (RLS)   . Shortness of breath at rest 06/27/2016  . Sleep apnea    does not use cpap  . Unspecified essential hypertension     ALLERGIES:  is allergic to codeine.  MEDICATIONS:  Current Outpatient Prescriptions  Medication Sig Dispense Refill  . acetaminophen (TYLENOL) 500 MG tablet Take 1,000 mg by mouth every 4 (four) hours as needed for mild pain, moderate pain, fever or headache.     [name] aspirin EC 81 MG tablet Take 81 mg by mouth daily.     . brimonidine-timolol (COMBIGAN) 0.2-0.5 % ophthalmic solution Place 1 drop into both eyes every 12 (twelve) hours.    . chlorpheniramine-HYDROcodone (TUSSIONEX) 10-8 MG/5ML SUER Take 5 mLs by mouth every 12 (twelve) hours as needed for cough. 140 mL 0  .  chlorproMAZINE (THORAZINE) 25 MG tablet Take 1 tablet (25 mg total) by mouth 3 (three) times daily as needed for hiccoughs. 30 tablet 0  . Cholecalciferol (VITAMIN D PO) Take 500 Units by mouth daily.    [name] dronabinol (MARINOL) 2.5 MG capsule Take 1 capsule (2.5 mg total) by mouth 2 (two) times daily before a meal. for appetite (Patient not taking: Reported on 06/27/2016) 60 capsule  0  . finasteride ([name]) 5 MG tablet Take 1 tablet (5 mg total) by mouth daily. 30 tablet 2  . fluticasone (FLONASE) 50 MCG/ACT nasal spray Place 2 sprays into both nostrils daily as needed for allergies or rhinitis. 48 g 3  . guaiFENesin (MUCINEX) 600 MG 12 hr tablet Take 2 tablets (1,200 mg total) by mouth 2 (two) times daily. (Patient not taking: Reported on 06/27/2016) 30 tablet 1  . lactulose (CHRONULAC) 10 GM/15ML solution Take 10 g by mouth 2 (two) times daily as needed (constipation).     [name] levothyroxine (SYNTHROID, LEVOTHROID) 125 MCG tablet TAKE 1 TABLET BY MOUTH DAILY 30 tablet 0  . LINZESS 290 MCG CAPS capsule TAKE ONE CAPSULE BY MOUTH ONCE DAILY (Patient not taking: Reported on 06/27/2016) 30 capsule 3  . meloxicam (MOBIC) 7.5 MG tablet Take 1 tablet (7.5 mg total) by mouth daily. 30 tablet 1  . methylPREDNISolone (MEDROL DOSEPAK) 4 MG TBPK tablet Use as instructed 21 tablet 0  . mirtazapine (REMERON) 30 MG tablet Take 1 tablet (30 mg total) by mouth at bedtime. (Patient not taking: Reported on 06/27/2016) 30 tablet 0  . Multiple Vitamins-Minerals (CENTRUM SILVER 50+MEN) TABS Take 1 tablet by mouth daily.    [name] omeprazole (PRILOSEC) 20 MG capsule Take 1 capsule (20 mg total) by mouth every morning. 90 capsule 3  . oxyCODONE-acetaminophen (PERCOCET/ROXICET) 5-325 MG tablet Take 1 tablet by mouth every 4 (four) hours as needed for severe pain. 30 tablet 0  . potassium chloride SA (K-DUR,KLOR-CON) 20 MEQ tablet Take 1 tablet (20 mEq total) by mouth 2 (two) times daily. 7 tablet 0  . predniSONE (DELTASONE) 10 MG tablet Take 1 tablet (10 mg total) by mouth daily. 30 tablet 0  . prochlorperazine (COMPAZINE) 10 MG tablet TAKE 1 TABLET BY MOUTH EVERY 6 HOURS AS NEEDED FOR NAUSEA AND VOMITING 60 tablet 0  . senna-docusate (SENOKOT-S) 8.6-50 MG tablet Take 2 tablets by mouth 2 (two) times daily. 30 tablet 0  . sildenafil (VIAGRA) 100 MG tablet Take 1 tablet (100 mg total) by mouth as directed.  (Patient not taking: Reported on 06/27/2016) 10 tablet 0  . simvastatin (ZOCOR) 20 MG tablet TAKE 1 TABLET BY MOUTH AT BEDTIME (Patient not taking: Reported on 06/27/2016) 90 tablet 0  . sucralfate (CARAFATE) 1 g tablet Take 1 tablet (1 g total) by mouth 4 (four) times daily -  with meals and at bedtime. Mix in 2Tbs water, swish and swallow (Patient taking differently: Take 1 g by mouth 4 (four) times daily as needed (for stomach). Mix in 2Tbs water, swish and swallow) 60 tablet 0  . tamsulosin (FLOMAX) 0.4 MG CAPS capsule Take 2 capsules (0.8 mg total) by mouth at bedtime. 90 capsule 3  . traMADol (ULTRAM) 50 MG tablet Take 1 tablet (50 mg total) by mouth every 6 (six) hours as needed for moderate pain. (Patient not taking: Reported on 06/27/2016) 30 tablet 0   No current facility-administered medications for this visit.     SURGICAL HISTORY:  Past Surgical History:  Procedure Laterality Date  .  APPENDECTOMY  12/2012  . CARDIAC CATHETERIZATION  ? 2008  . CHEST TUBE INSERTION Right 03/01/2016   Procedure: INSERTION PLEURAL DRAINAGE CATHETER with doxycycline pleurodesis;  Surgeon: Gaye Pollack, MD;  Location: [facility] OR;  Service: Thoracic;  Laterality: Right;  . COLONOSCOPY    . LAPAROSCOPIC APPENDECTOMY N/A 01/27/2013   Procedure: APPENDECTOMY LAPAROSCOPIC;  Surgeon: Edward Jolly, MD;  Location: [facility] ORS;  Service: General;  Laterality: N/A;  . MEDIASTINOSCOPY N/A 05/31/2015   Procedure: MEDIASTINOSCOPY;  Surgeon: Gaye Pollack, MD;  Location: [place];  Service: Thoracic;  Laterality: N/A;  . none    . REMOVAL OF PLEURAL DRAINAGE CATHETER Right 04/04/2016   Procedure: REMOVAL OF PLEURAL DRAINAGE CATHETER;  Surgeon: Gaye Pollack, MD;  Location: [place];  Service: Thoracic;  Laterality: Right;  . THYROIDECTOMY    . TONSILLECTOMY    . VIDEO BRONCHOSCOPY N/A 05/31/2015   Procedure: VIDEO BRONCHOSCOPY;  Surgeon: Gaye Pollack, MD;  Location: [facility] OR;  Service: Thoracic;  Laterality: N/A;     REVIEW OF SYSTEMS:  Constitutional: positive for fatigue Eyes: negative Ears, nose, mouth, throat, and face: negative Respiratory: positive for cough, dyspnea on exertion and wheezing Cardiovascular: negative Gastrointestinal: negative Genitourinary:negative Integument/breast: negative Hematologic/lymphatic: negative Musculoskeletal:negative Neurological: negative Behavioral/Psych: negative Endocrine: negative Allergic/Immunologic: negative   PHYSICAL EXAMINATION: General appearance: alert, cooperative, fatigued and no distress Head: Normocephalic, without obvious abnormality, atraumatic Neck: no adenopathy, no JVD, supple, symmetrical, trachea midline and thyroid not enlarged, symmetric, no tenderness/mass/nodules Lymph nodes: Cervical, supraclavicular, and axillary nodes normal. Resp: diminished breath sounds RLL, dullness to percussion RLL and wheezes bilaterally Back: symmetric, no curvature. ROM normal. No CVA tenderness. Cardio: regular rate and rhythm, S1, S2 normal, no murmur, click, rub or gallop GI: soft, non-tender; bowel sounds normal; no masses,  no organomegaly Extremities: extremities normal, atraumatic, no cyanosis or edema Neurologic: Alert and oriented X 3, normal strength and tone. Normal symmetric reflexes. Normal coordination and gait  ECOG PERFORMANCE STATUS: 1 - Symptomatic but completely ambulatory  Blood pressure 102/67, pulse 94, temperature 97.8 F (36.6 C), temperature source Oral, resp. rate 18, height '5\' 6"'$  (1.676 m), weight 136 lb 12.8 oz (62.1 kg), SpO2 95 %.  LABORATORY DATA: Lab Results  Component Value Date   WBC 3.7 (L) 07/04/2016   HGB 11.3 (L) 07/04/2016   HCT 34.1 (L) 07/04/2016   MCV 85.9 07/04/2016   PLT 414 (H) 07/04/2016      Chemistry      Component Value Date/Time   NA 139 07/04/2016 1207   K 3.6 07/04/2016 1207   CL 100 (L) 03/10/2016 2110   CO2 22 07/04/2016 1207   BUN 13.6 07/04/2016 1207   CREATININE 0.9  07/04/2016 1207      Component Value Date/Time   CALCIUM 9.7 07/04/2016 1207   ALKPHOS 70 07/04/2016 1207   AST 10 07/04/2016 1207   ALT 8 07/04/2016 1207   BILITOT <0.22 07/04/2016 1207       RADIOGRAPHIC STUDIES: Ct Chest W Contrast  Result Date: 06/27/2016 CLINICAL DATA:  Right upper lobe lung carcinoma. Undergoing chemotherapy. Restaging. Previous radiation therapy. Worsening cough and nausea. Prostate carcinoma. EXAM: CT CHEST, ABDOMEN, AND PELVIS WITH CONTRAST TECHNIQUE: Multidetector CT imaging of the chest, abdomen and pelvis was performed following the standard protocol during bolus administration of intravenous contrast. CONTRAST:  132m ISOVUE-300 IOPAMIDOL (ISOVUE-300) INJECTION 61% COMPARISON:  Chest CT on 02/13/2016 and PET-CT on 12/19/2015 FINDINGS: CT CHEST FINDINGS Cardiovascular: No acute findings. Mediastinum/Lymph Nodes:  Increase mediastinal lymphadenopathy in the subcarinal region, currently measuring 2.4 cm on image 30/2 compared to 1.2 cm previously. Increased right hilar lymphadenopathy measuring 3.1 cm on image 27/2, which is difficult to compare with previous study given large right pleural effusion and atelectasis. There is also new mild left hilar lymphadenopathy measuring 1.0 cm on image 26/2. Lungs/Pleura: Near complete resolution of right pleural effusion is seen since previous study with decreased right lung atelectasis. Increased enhancing soft tissue nodularity is seen in the right pleural space, consistent with pleural metastases. There is also asymmetric pulmonary interstitial prominence mainly in the right lower lobe, suspicious for lymphangitic spread of carcinoma. Increased size of mass in the medial right upper lobe, currently measuring 7.4 x 4.2 cm on image 21/2 compared to 2.8 x 2.4 cm previously. Increased number and size of bilateral pulmonary nodules compared previous study, consistent with diffuse pulmonary metastases. Tiny left pleural effusion is  decreased in size since prior exam. Musculoskeletal: Increased sclerosis seen at previous site of lytic metastasis in the T9 vertebral body. No new osseous metastatic lesions identified. CT ABDOMEN AND PELVIS FINDINGS Hepatobiliary: No masses identified. Gallbladder is unremarkable. Pancreas:  No mass or inflammatory changes. Spleen:  Within normal limits in size and appearance. Adrenals/Urinary tract: No evidence of adrenal or renal masses. Small bilateral renal cysts noted. No evidence of renal mass or hydronephrosis. Stable diffuse bladder wall thickening, likely due to chronic bladder outlet obstruction. Stomach/Bowel: No evidence of obstruction, inflammatory process, or abnormal fluid collections. Vascular/Lymphatic: No pathologically enlarged lymph nodes identified. No abdominal aortic aneurysm. Aortic atherosclerosis. Reproductive: Stable enlarged prostate with median lobe hypertrophy indenting the bladder base. Other:  Minimal free fluid in dependent portion of pelvis. Musculoskeletal: Increased sclerosis is seen at previous sites of lytic metastases in the T9 vertebral body and right superior pubic ramus, consistent with healing bone metastases. No new lytic metastases identified. IMPRESSION: Significant decrease in right pleural effusion and compressive atelectasis since prior study. Increased right pleural metastatic disease, and probable lymphangitic spread of carcinoma in the right lower lung. Increased size of medial right upper lobe mass, and increased bilateral pulmonary metastases. Increased mediastinal and bilateral hilar lymphadenopathy. No evidence of soft tissue metastatic disease within the abdomen or pelvis. Interval healing response of lytic bone metastases in the T9 vertebral body and right superior pubic ramus. No new or progressive osseous metastatic disease identified. Electronically Signed   By: Earle Gell M.D.   On: 06/27/2016 09:01   Ct Abdomen Pelvis W Contrast  Result Date:  06/27/2016 CLINICAL DATA:  Right upper lobe lung carcinoma. Undergoing chemotherapy. Restaging. Previous radiation therapy. Worsening cough and nausea. Prostate carcinoma. EXAM: CT CHEST, ABDOMEN, AND PELVIS WITH CONTRAST TECHNIQUE: Multidetector CT imaging of the chest, abdomen and pelvis was performed following the standard protocol during bolus administration of intravenous contrast. CONTRAST:  192m ISOVUE-300 IOPAMIDOL (ISOVUE-300) INJECTION 61% COMPARISON:  Chest CT on 02/13/2016 and PET-CT on 12/19/2015 FINDINGS: CT CHEST FINDINGS Cardiovascular: No acute findings. Mediastinum/Lymph Nodes: Increase mediastinal lymphadenopathy in the subcarinal region, currently measuring 2.4 cm on image 30/2 compared to 1.2 cm previously. Increased right hilar lymphadenopathy measuring 3.1 cm on image 27/2, which is difficult to compare with previous study given large right pleural effusion and atelectasis. There is also new mild left hilar lymphadenopathy measuring 1.0 cm on image 26/2. Lungs/Pleura: Near complete resolution of right pleural effusion is seen since previous study with decreased right lung atelectasis. Increased enhancing soft tissue nodularity is seen in the right pleural  space, consistent with pleural metastases. There is also asymmetric pulmonary interstitial prominence mainly in the right lower lobe, suspicious for lymphangitic spread of carcinoma. Increased size of mass in the medial right upper lobe, currently measuring 7.4 x 4.2 cm on image 21/2 compared to 2.8 x 2.4 cm previously. Increased number and size of bilateral pulmonary nodules compared previous study, consistent with diffuse pulmonary metastases. Tiny left pleural effusion is decreased in size since prior exam. Musculoskeletal: Increased sclerosis seen at previous site of lytic metastasis in the T9 vertebral body. No new osseous metastatic lesions identified. CT ABDOMEN AND PELVIS FINDINGS Hepatobiliary: No masses identified. Gallbladder is  unremarkable. Pancreas:  No mass or inflammatory changes. Spleen:  Within normal limits in size and appearance. Adrenals/Urinary tract: No evidence of adrenal or renal masses. Small bilateral renal cysts noted. No evidence of renal mass or hydronephrosis. Stable diffuse bladder wall thickening, likely due to chronic bladder outlet obstruction. Stomach/Bowel: No evidence of obstruction, inflammatory process, or abnormal fluid collections. Vascular/Lymphatic: No pathologically enlarged lymph nodes identified. No abdominal aortic aneurysm. Aortic atherosclerosis. Reproductive: Stable enlarged prostate with median lobe hypertrophy indenting the bladder base. Other:  Minimal free fluid in dependent portion of pelvis. Musculoskeletal: Increased sclerosis is seen at previous sites of lytic metastases in the T9 vertebral body and right superior pubic ramus, consistent with healing bone metastases. No new lytic metastases identified. IMPRESSION: Significant decrease in right pleural effusion and compressive atelectasis since prior study. Increased right pleural metastatic disease, and probable lymphangitic spread of carcinoma in the right lower lung. Increased size of medial right upper lobe mass, and increased bilateral pulmonary metastases. Increased mediastinal and bilateral hilar lymphadenopathy. No evidence of soft tissue metastatic disease within the abdomen or pelvis. Interval healing response of lytic bone metastases in the T9 vertebral body and right superior pubic ramus. No new or progressive osseous metastatic disease identified. Electronically Signed   By: Earle Gell M.D.   On: 06/27/2016 09:01    ASSESSMENT AND PLAN: This is a very pleasant 76 years old African-American male with metastatic non-small cell lung cancer, adenocarcinoma status post several chemotherapy regimens. He'll also referred to hospice for few months but he revoked his hospice care and decided to start treatment again. He was seen for  second opinion at [facility] and the recommended for the patient to start treatment with single agent. He is currently on treatment with single agent gemcitabine 1000 MG/M2 on days 1 and 8 status post 2 cycles. He had repeat CT scan of the chest, abdomen and pelvis performed on 06/27/2016. This was compared to a scan several months before starting the treatment. I personally and independently reviewed the scan images and discuss the results with the patient his wife. It definitely showed significant disease progression compared to the prior imaging studies before the patient was referred to hospice. I recommended for the patient to continue his current treatment with gemcitabine for 2 more cycles before repeating imaging studies to see if he is responding to this treatment. For the cough, he will continue on Hycodan and Dylsem. He'll also continue on prednisone 10 mg by mouth daily. For pain management, the patient is currently on tramadol and Percocet. For depression and lack of appetite, the patient will continue on Remeron 30 mg by mouth daily at bedtime. For hypothyroidism, the patient will continue on Synthroid 125 g by mouth daily. He would come back for follow-up visit in 3 weeks for evaluation before starting cycle #4 of his chemotherapy. The  patient was advised to call immediately if he has any concerning symptoms in the interval. The patient voices understanding of current disease status and treatment options and is in agreement with the current care plan.  All questions were answered. The patient knows to call the clinic with any problems, questions or concerns. We can certainly see the patient much sooner if necessary.  Disclaimer: This note was dictated with voice recognition software. Similar sounding words can inadvertently be transcribed and may not be corrected upon review.

## 2016-07-17 NOTE — Telephone Encounter (Signed)
Appointments scheduled per 1/16 LOS. Patient given AVS report and calendars with future scheduled appointments.

## 2016-07-17 NOTE — Patient Instructions (Signed)
[facility] Discharge Instructions for Patients Receiving Chemotherapy  Today you received the following chemotherapy agents Gemzar  To help prevent nausea and vomiting after your treatment, we encourage you to take your nausea medication as directed.    If you develop nausea and vomiting that is not controlled by your nausea medication, call the clinic.   BELOW ARE SYMPTOMS THAT SHOULD BE REPORTED IMMEDIATELY:  *FEVER GREATER THAN 100.5 F  *CHILLS WITH OR WITHOUT FEVER  NAUSEA AND VOMITING THAT IS NOT CONTROLLED WITH YOUR NAUSEA MEDICATION  *UNUSUAL SHORTNESS OF BREATH  *UNUSUAL BRUISING OR BLEEDING  TENDERNESS IN MOUTH AND THROAT WITH OR WITHOUT PRESENCE OF ULCERS  *URINARY PROBLEMS  *BOWEL PROBLEMS  UNUSUAL RASH Items with * indicate a potential emergency and should be followed up as soon as possible.  Feel free to call the clinic you have any questions or concerns. The clinic phone number is [phone].  Please show the CHEMO ALERT CARD at check-in to the Emergency Department and triage nurse.   

## 2016-07-24 ENCOUNTER — Other Ambulatory Visit (HOSPITAL_BASED_OUTPATIENT_CLINIC_OR_DEPARTMENT_OTHER): Payer: Medicare HMO

## 2016-07-24 ENCOUNTER — Other Ambulatory Visit: Payer: Self-pay | Admitting: *Deleted

## 2016-07-24 ENCOUNTER — Ambulatory Visit: Payer: Medicare HMO

## 2016-07-24 ENCOUNTER — Ambulatory Visit (HOSPITAL_BASED_OUTPATIENT_CLINIC_OR_DEPARTMENT_OTHER): Payer: Medicare HMO

## 2016-07-24 VITALS — BP 114/70 | HR 101 | Temp 98.2°F | Resp 18

## 2016-07-24 DIAGNOSIS — C3411 Malignant neoplasm of upper lobe, right bronchus or lung: Secondary | ICD-10-CM | POA: Diagnosis not present

## 2016-07-24 DIAGNOSIS — R5382 Chronic fatigue, unspecified: Secondary | ICD-10-CM | POA: Diagnosis not present

## 2016-07-24 DIAGNOSIS — Z5111 Encounter for antineoplastic chemotherapy: Secondary | ICD-10-CM

## 2016-07-24 DIAGNOSIS — C3490 Malignant neoplasm of unspecified part of unspecified bronchus or lung: Secondary | ICD-10-CM

## 2016-07-24 DIAGNOSIS — Z5112 Encounter for antineoplastic immunotherapy: Secondary | ICD-10-CM

## 2016-07-24 DIAGNOSIS — K5900 Constipation, unspecified: Secondary | ICD-10-CM

## 2016-07-24 LAB — CBC WITH DIFFERENTIAL/PLATELET
BASO%: 0.4 % (ref 0.0–2.0)
Basophils Absolute: 0 10*3/uL (ref 0.0–0.1)
EOS%: 0.1 % (ref 0.0–7.0)
Eosinophils Absolute: 0 10*3/uL (ref 0.0–0.5)
HCT: 33.8 % — ABNORMAL LOW (ref 38.4–49.9)
HGB: 10.9 g/dL — ABNORMAL LOW (ref 13.0–17.1)
LYMPH%: 18.1 % (ref 14.0–49.0)
MCH: 28.1 pg (ref 27.2–33.4)
MCHC: 32.3 g/dL (ref 32.0–36.0)
MCV: 87 fL (ref 79.3–98.0)
MONO#: 0.4 10*3/uL (ref 0.1–0.9)
MONO%: 15 % — ABNORMAL HIGH (ref 0.0–14.0)
NEUT#: 1.7 10*3/uL (ref 1.5–6.5)
NEUT%: 66.4 % (ref 39.0–75.0)
PLATELETS: 408 10*3/uL — AB (ref 140–400)
RBC: 3.88 10*6/uL — AB (ref 4.20–5.82)
RDW: 18.8 % — ABNORMAL HIGH (ref 11.0–14.6)
WBC: 2.6 10*3/uL — ABNORMAL LOW (ref 4.0–10.3)
lymph#: 0.5 10*3/uL — ABNORMAL LOW (ref 0.9–3.3)

## 2016-07-24 LAB — COMPREHENSIVE METABOLIC PANEL
ALT: 8 U/L (ref 0–55)
ANION GAP: 6 meq/L (ref 3–11)
AST: 9 U/L (ref 5–34)
Albumin: 2.8 g/dL — ABNORMAL LOW (ref 3.5–5.0)
Alkaline Phosphatase: 62 U/L (ref 40–150)
BUN: 11.7 mg/dL (ref 7.0–26.0)
CHLORIDE: 105 meq/L (ref 98–109)
CO2: 26 meq/L (ref 22–29)
CREATININE: 0.9 mg/dL (ref 0.7–1.3)
Calcium: 9.2 mg/dL (ref 8.4–10.4)
Glucose: 132 mg/dl (ref 70–140)
Potassium: 3.6 mEq/L (ref 3.5–5.1)
Sodium: 138 mEq/L (ref 136–145)
Total Bilirubin: 0.22 mg/dL (ref 0.20–1.20)
Total Protein: 6.1 g/dL — ABNORMAL LOW (ref 6.4–8.3)

## 2016-07-24 LAB — TECHNOLOGIST REVIEW

## 2016-07-24 LAB — TSH: TSH: 4.876 m(IU)/L — ABNORMAL HIGH (ref 0.320–4.118)

## 2016-07-24 MED ORDER — PROCHLORPERAZINE MALEATE 10 MG PO TABS
ORAL_TABLET | ORAL | Status: AC
  Filled 2016-07-24: qty 1

## 2016-07-24 MED ORDER — SODIUM CHLORIDE 0.9 % IV SOLN
Freq: Once | INTRAVENOUS | Status: AC
  Administered 2016-07-24: 14:00:00 via INTRAVENOUS

## 2016-07-24 MED ORDER — PROCHLORPERAZINE MALEATE 10 MG PO TABS
10.0000 mg | ORAL_TABLET | Freq: Once | ORAL | Status: AC
  Administered 2016-07-24: 10 mg via ORAL

## 2016-07-24 MED ORDER — SODIUM CHLORIDE 0.9 % IV SOLN
1000.0000 mg/m2 | Freq: Once | INTRAVENOUS | Status: AC
  Administered 2016-07-24: 1710 mg via INTRAVENOUS
  Filled 2016-07-24: qty 44.97

## 2016-07-24 MED ORDER — HYDROCOD POLST-CPM POLST ER 10-8 MG/5ML PO SUER: 5.0000 mL | Freq: Two times a day (BID) | ORAL | 0 refills | Status: DC | PRN

## 2016-07-24 NOTE — Patient Instructions (Signed)
[facility] Discharge Instructions for Patients Receiving Chemotherapy  Today you received the following chemotherapy agents Gemzar  To help prevent nausea and vomiting after your treatment, we encourage you to take your nausea medication as prescribed   If you develop nausea and vomiting that is not controlled by your nausea medication, call the clinic.   BELOW ARE SYMPTOMS THAT SHOULD BE REPORTED IMMEDIATELY:  *FEVER GREATER THAN 100.5 F  *CHILLS WITH OR WITHOUT FEVER  NAUSEA AND VOMITING THAT IS NOT CONTROLLED WITH YOUR NAUSEA MEDICATION  *UNUSUAL SHORTNESS OF BREATH  *UNUSUAL BRUISING OR BLEEDING  TENDERNESS IN MOUTH AND THROAT WITH OR WITHOUT PRESENCE OF ULCERS  *URINARY PROBLEMS  *BOWEL PROBLEMS  UNUSUAL RASH Items with * indicate a potential emergency and should be followed up as soon as possible.  Feel free to call the clinic you have any questions or concerns. The clinic phone number is [phone].  Please show the CHEMO ALERT CARD at check-in to the Emergency Department and triage nurse.   

## 2016-07-24 NOTE — Telephone Encounter (Signed)
Pt here in treatment, request for tussionex. Notified pt Rx will be available tomorrow as MD out of office for the afternoon. Pt verbalized understanding.

## 2016-07-29 ENCOUNTER — Other Ambulatory Visit: Payer: Self-pay | Admitting: Internal Medicine

## 2016-07-30 ENCOUNTER — Telehealth: Payer: Self-pay | Admitting: Internal Medicine

## 2016-07-30 ENCOUNTER — Other Ambulatory Visit: Payer: Self-pay

## 2016-07-30 ENCOUNTER — Other Ambulatory Visit: Payer: Self-pay | Admitting: Internal Medicine

## 2016-07-30 MED ORDER — LEVOTHYROXINE SODIUM 125 MCG PO TABS: 125.0000 ug | ORAL_TABLET | Freq: Every day | ORAL | 0 refills | Status: DC

## 2016-07-30 NOTE — Telephone Encounter (Signed)
rx submitted.  

## 2016-07-30 NOTE — Telephone Encounter (Signed)
Pt needs levothyroxine please call into [facility] on [address] please call in today

## 2016-08-07 ENCOUNTER — Ambulatory Visit (HOSPITAL_BASED_OUTPATIENT_CLINIC_OR_DEPARTMENT_OTHER): Payer: Medicare HMO

## 2016-08-07 ENCOUNTER — Telehealth: Payer: Self-pay | Admitting: Internal Medicine

## 2016-08-07 ENCOUNTER — Telehealth: Payer: Self-pay | Admitting: *Deleted

## 2016-08-07 ENCOUNTER — Ambulatory Visit (HOSPITAL_BASED_OUTPATIENT_CLINIC_OR_DEPARTMENT_OTHER): Payer: Medicare HMO | Admitting: Internal Medicine

## 2016-08-07 ENCOUNTER — Encounter: Payer: Self-pay | Admitting: Internal Medicine

## 2016-08-07 ENCOUNTER — Other Ambulatory Visit (HOSPITAL_BASED_OUTPATIENT_CLINIC_OR_DEPARTMENT_OTHER): Payer: Medicare HMO

## 2016-08-07 VITALS — BP 124/80 | HR 116 | Temp 98.0°F | Resp 18 | Ht 66.0 in | Wt 132.4 lb

## 2016-08-07 DIAGNOSIS — C61 Malignant neoplasm of prostate: Secondary | ICD-10-CM

## 2016-08-07 DIAGNOSIS — R05 Cough: Secondary | ICD-10-CM

## 2016-08-07 DIAGNOSIS — Z5111 Encounter for antineoplastic chemotherapy: Secondary | ICD-10-CM | POA: Diagnosis not present

## 2016-08-07 DIAGNOSIS — C3411 Malignant neoplasm of upper lobe, right bronchus or lung: Secondary | ICD-10-CM

## 2016-08-07 DIAGNOSIS — R634 Abnormal weight loss: Secondary | ICD-10-CM

## 2016-08-07 DIAGNOSIS — C7951 Secondary malignant neoplasm of bone: Secondary | ICD-10-CM

## 2016-08-07 DIAGNOSIS — E46 Unspecified protein-calorie malnutrition: Secondary | ICD-10-CM

## 2016-08-07 DIAGNOSIS — C3490 Malignant neoplasm of unspecified part of unspecified bronchus or lung: Secondary | ICD-10-CM

## 2016-08-07 DIAGNOSIS — K5900 Constipation, unspecified: Secondary | ICD-10-CM

## 2016-08-07 LAB — CBC WITH DIFFERENTIAL/PLATELET
BASO%: 0.2 % (ref 0.0–2.0)
BASOS ABS: 0 10*3/uL (ref 0.0–0.1)
EOS ABS: 0 10*3/uL (ref 0.0–0.5)
EOS%: 0 % (ref 0.0–7.0)
HEMATOCRIT: 37.7 % — AB (ref 38.4–49.9)
HEMOGLOBIN: 11.7 g/dL — AB (ref 13.0–17.1)
LYMPH#: 0.5 10*3/uL — AB (ref 0.9–3.3)
LYMPH%: 8.4 % — ABNORMAL LOW (ref 14.0–49.0)
MCH: 28.2 pg (ref 27.2–33.4)
MCHC: 31 g/dL — ABNORMAL LOW (ref 32.0–36.0)
MCV: 90.8 fL (ref 79.3–98.0)
MONO#: 0.5 10*3/uL (ref 0.1–0.9)
MONO%: 9 % (ref 0.0–14.0)
NEUT%: 82.4 % — ABNORMAL HIGH (ref 39.0–75.0)
NEUTROS ABS: 4.5 10*3/uL (ref 1.5–6.5)
PLATELETS: 368 10*3/uL (ref 140–400)
RBC: 4.15 10*6/uL — ABNORMAL LOW (ref 4.20–5.82)
RDW: 19 % — AB (ref 11.0–14.6)
WBC: 5.5 10*3/uL (ref 4.0–10.3)

## 2016-08-07 LAB — COMPREHENSIVE METABOLIC PANEL
ALBUMIN: 2.8 g/dL — AB (ref 3.5–5.0)
ALK PHOS: 64 U/L (ref 40–150)
ALT: 10 U/L (ref 0–55)
ANION GAP: 7 meq/L (ref 3–11)
AST: 10 U/L (ref 5–34)
BILIRUBIN TOTAL: 0.26 mg/dL (ref 0.20–1.20)
BUN: 13.9 mg/dL (ref 7.0–26.0)
CALCIUM: 9.6 mg/dL (ref 8.4–10.4)
CO2: 28 mEq/L (ref 22–29)
Chloride: 102 mEq/L (ref 98–109)
Creatinine: 1 mg/dL (ref 0.7–1.3)
EGFR: 90 mL/min/{1.73_m2} — AB (ref >90)
GLUCOSE: 112 mg/dL (ref 70–140)
POTASSIUM: 3.7 meq/L (ref 3.5–5.1)
SODIUM: 138 meq/L (ref 136–145)
TOTAL PROTEIN: 6.3 g/dL — AB (ref 6.4–8.3)

## 2016-08-07 MED ORDER — PROCHLORPERAZINE MALEATE 10 MG PO TABS
ORAL_TABLET | ORAL | Status: AC
  Filled 2016-08-07: qty 1

## 2016-08-07 MED ORDER — SODIUM CHLORIDE 0.9 % IV SOLN
Freq: Once | INTRAVENOUS | Status: AC
  Administered 2016-08-07: 14:00:00 via INTRAVENOUS

## 2016-08-07 MED ORDER — SENNOSIDES-DOCUSATE SODIUM 8.6-50 MG PO TABS
2.0000 | ORAL_TABLET | Freq: Two times a day (BID) | ORAL | 0 refills | Status: AC
Start: 2016-08-07 — End: ?

## 2016-08-07 MED ORDER — SODIUM CHLORIDE 0.9 % IV SOLN
1000.0000 mg/m2 | Freq: Once | INTRAVENOUS | Status: AC
  Administered 2016-08-07: 1710 mg via INTRAVENOUS
  Filled 2016-08-07: qty 44.97

## 2016-08-07 MED ORDER — HYDROCOD POLST-CPM POLST ER 10-8 MG/5ML PO SUER: 5.0000 mL | Freq: Two times a day (BID) | ORAL | 0 refills | Status: AC | PRN

## 2016-08-07 MED ORDER — PROCHLORPERAZINE MALEATE 10 MG PO TABS
10.0000 mg | ORAL_TABLET | Freq: Once | ORAL | Status: AC
  Administered 2016-08-07: 10 mg via ORAL

## 2016-08-07 MED ORDER — PREDNISONE 10 MG PO TABS: 10.0000 mg | ORAL_TABLET | Freq: Every day | ORAL | 0 refills | Status: AC

## 2016-08-07 NOTE — Telephone Encounter (Signed)
Message sent to Chemo scheduler to be added per 08/07/16 los. Appointments scheduled per 08/07/16 los. Patient was given a copy of the AVS report and appointment schedule,per 08/07/16 los.

## 2016-08-07 NOTE — Progress Notes (Signed)
[place] [phone]   [phone]  OFFICE PROGRESS NOTE  [name], MD [place] [address]  DIAGNOSIS: Metastatic non-small cell lung cancer, adenocarcinoma initially diagnosed as a stage IIIA (T1b, N2, M0) presented with right upper lobe lung nodule in addition to mediastinal lymphadenopathy diagnosed in November 2016.  MOLECULAR STUDIES (Foundation one):  Genomic Alterations Identified? KRAS G12C STK11 Q220*  PRIOR THERAPY: 1) Concurrent chemoradiation with weekly carboplatin for AUC of 2 and paclitaxel 45 MG/M2. Status post 5 cycles. Last dose was given 07/18/2015 with partial response. 2) Consolidation chemotherapy with carboplatin for AUC of 5 and Alimta 500 MG/M2 every 3 weeks. First dose 10/06/2015. Status post 3 cycles. 3) Immunotherapy with Nivolumab 240 MG IV every 2 weeks. First dose 12/28/2015. Status post 4 cycles. Last dose was giving 02/08/2016 discontinued secondary to disease progression.  CURRENT THERAPY: Systemic chemotherapy with gemcitabine 1000 MG/M2 on days 1 and 8 every 3 weeks status post 3 cycles.  INTERVAL HISTORY: Alan [name] 76 y.o. male came to the clinic today for follow-up visit accompanied by his wife. The patient tolerated the last cycle of his systemic chemotherapy with single agent gemcitabine fairly well except for mild fatigue. He lost around 5 pounds since his last visit. He continues to have dry cough and requesting refill of his cough medication. He denied having any significant chest pain but has shortness breath with exertion with no hemoptysis. He denied having any nausea, vomiting or diarrhea. He has no headache or visual changes. He is here today for evaluation before starting cycle #4 of his treatment.   MEDICAL HISTORY: Past Medical History:  Diagnosis Date  . 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 Anxiety state, unspecified   . Benign hypertrophy of  prostate   . Benign neoplasm of colon    colon polyps  . Cancer [facility])    prostate cancer  . Chronic fatigue 12/23/2015  . Constipation, chronic   . Cough 06/27/2016  . Dehydration 02/22/2016  . Diverticulosis of colon (without mention of hemorrhage)   . Dysuria 03/07/2016  . Encounter for antineoplastic chemotherapy 09/29/2015  . Encounter for antineoplastic immunotherapy 12/23/2015  . Erectile dysfunction   . GERD (gastroesophageal reflux disease)   . Glaucoma   . Hypercholesteremia   . Hypothyroidism   . Irritable bowel syndrome   . Left hip pain 10/27/2015  . Malignant pleural effusion    right  . Onychomycosis   . Radiation 06/20/15-08/02/15   right upper lung region 60 gray  . Restless legs syndrome (RLS)   . Shortness of breath at rest 06/27/2016  . Sleep apnea    does not use cpap  . Unspecified essential hypertension     ALLERGIES:  is allergic to codeine.  MEDICATIONS:  Current Outpatient Prescriptions  Medication Sig Dispense Refill  . acetaminophen (TYLENOL) 500 MG tablet Take 1,000 mg by mouth every 4 (four) hours as needed for mild pain, moderate pain, fever or headache.     [name] aspirin EC 81 MG tablet Take 81 mg by mouth daily.     . brimonidine-timolol (COMBIGAN) 0.2-0.5 % ophthalmic solution Place 1 drop into both eyes every 12 (twelve) hours.    . chlorpheniramine-HYDROcodone (TUSSIONEX) 10-8 MG/5ML SUER Take 5 mLs by mouth every 12 (twelve) hours as needed for cough. 140 mL 0  . chlorproMAZINE (THORAZINE) 25 MG tablet Take 1 tablet (25 mg total) by mouth 3 (three) times daily as  needed for hiccoughs. 30 tablet 0  . Cholecalciferol (VITAMIN D PO) Take 500 Units by mouth daily.    [name] dronabinol (MARINOL) 2.5 MG capsule Take 1 capsule (2.5 mg total) by mouth 2 (two) times daily before a meal. for appetite (Patient not taking: Reported on 07/17/2016) 60 capsule 0  . finasteride ([name]) 5 MG tablet Take 1 tablet (5 mg total) by mouth daily. 30 tablet 2  . fluticasone  (FLONASE) 50 MCG/ACT nasal spray Place 2 sprays into both nostrils daily as needed for allergies or rhinitis. 48 g 3  . lactulose (CHRONULAC) 10 GM/15ML solution Take 10 g by mouth 2 (two) times daily as needed (constipation).     [name] levothyroxine (SYNTHROID, LEVOTHROID) 125 MCG tablet Take 1 tablet (125 mcg total) by mouth daily. 30 tablet 0  . LINZESS 290 MCG CAPS capsule TAKE ONE CAPSULE BY MOUTH ONCE DAILY 30 capsule 3  . meloxicam (MOBIC) 7.5 MG tablet Take 1 tablet (7.5 mg total) by mouth daily. 30 tablet 1  . mirtazapine (REMERON) 30 MG tablet Take 1 tablet (30 mg total) by mouth at bedtime. (Patient not taking: Reported on 07/17/2016) 30 tablet 0  . Multiple Vitamins-Minerals (CENTRUM SILVER 50+MEN) TABS Take 1 tablet by mouth daily.    [name] omeprazole (PRILOSEC) 20 MG capsule Take 1 capsule (20 mg total) by mouth every morning. 90 capsule 3  . oxyCODONE-acetaminophen (PERCOCET/ROXICET) 5-325 MG tablet Take 1 tablet by mouth every 4 (four) hours as needed for severe pain. 30 tablet 0  . potassium chloride SA (K-DUR,KLOR-CON) 20 MEQ tablet Take 1 tablet (20 mEq total) by mouth 2 (two) times daily. 7 tablet 0  . predniSONE (DELTASONE) 10 MG tablet Take 1 tablet (10 mg total) by mouth daily. 30 tablet 0  . prochlorperazine (COMPAZINE) 10 MG tablet TAKE 1 TABLET BY MOUTH EVERY 6 HOURS AS NEEDED FOR NAUSEA AND VOMITING (Patient not taking: Reported on 07/17/2016) 60 tablet 0  . senna-docusate (SENOKOT-S) 8.6-50 MG tablet Take 2 tablets by mouth 2 (two) times daily. 30 tablet 0  . simvastatin (ZOCOR) 20 MG tablet TAKE 1 TABLET BY MOUTH AT BEDTIME 90 tablet 0  . sucralfate (CARAFATE) 1 g tablet Take 1 tablet (1 g total) by mouth 4 (four) times daily -  with meals and at bedtime. Mix in 2Tbs water, swish and swallow (Patient taking differently: Take 1 g by mouth 4 (four) times daily as needed (for stomach). Mix in 2Tbs water, swish and swallow) 60 tablet 0  . tamsulosin (FLOMAX) 0.4 MG CAPS capsule Take 2  capsules (0.8 mg total) by mouth at bedtime. 90 capsule 3  . traMADol (ULTRAM) 50 MG tablet Take 1 tablet (50 mg total) by mouth every 6 (six) hours as needed for moderate pain. 30 tablet 0   No current facility-administered medications for this visit.     SURGICAL HISTORY:  Past Surgical History:  Procedure Laterality Date  . APPENDECTOMY  12/2012  . CARDIAC CATHETERIZATION  ? 2008  . CHEST TUBE INSERTION Right 03/01/2016   Procedure: INSERTION PLEURAL DRAINAGE CATHETER with doxycycline pleurodesis;  Surgeon: Gaye Pollack, MD;  Location[facility];  Service: Thoracic;  Laterality: Right;  . COLONOSCOPY    . LAPAROSCOPIC APPENDECTOMY N/A 01/27/2013   Procedure: APPENDECTOMY LAPAROSCOPIC;  Surgeon: Edward Jolly, MD;  Location: [facility] ORS;  Service: General;  Laterality: N/A;  . MEDIASTINOSCOPY N/A 05/31/2015   Procedure: MEDIASTINOSCOPY;  Surgeon: Gaye Pollack, MD;  Location: [place];  Service: Thoracic;  Laterality: N/A;  . none    . REMOVAL OF PLEURAL DRAINAGE CATHETER Right 04/04/2016   Procedure: REMOVAL OF PLEURAL DRAINAGE CATHETER;  Surgeon: Gaye Pollack, MD;  Location: [place];  Service: Thoracic;  Laterality: Right;  . THYROIDECTOMY    . TONSILLECTOMY    . VIDEO BRONCHOSCOPY N/A 05/31/2015   Procedure: VIDEO BRONCHOSCOPY;  Surgeon: Gaye Pollack, MD;  Location: [facility] OR;  Service: Thoracic;  Laterality: N/A;    REVIEW OF SYSTEMS:  A comprehensive review of systems was negative except for: Constitutional: positive for fatigue and weight loss Respiratory: positive for cough and dyspnea on exertion Musculoskeletal: positive for muscle weakness   PHYSICAL EXAMINATION: General appearance: alert, cooperative, fatigued and no distress Head: Normocephalic, without obvious abnormality, atraumatic Neck: no adenopathy, no JVD, supple, symmetrical, trachea midline and thyroid not enlarged, symmetric, no tenderness/mass/nodules Lymph nodes: Cervical, supraclavicular, and axillary nodes  normal. Resp: wheezes bilaterally Back: symmetric, no curvature. ROM normal. No CVA tenderness. Cardio: regular rate and rhythm, S1, S2 normal, no murmur, click, rub or gallop GI: soft, non-tender; bowel sounds normal; no masses,  no organomegaly Extremities: extremities normal, atraumatic, no cyanosis or edema  ECOG PERFORMANCE STATUS: 1 - Symptomatic but completely ambulatory  Blood pressure 124/80, pulse (!) 116, temperature 98 F (36.7 C), temperature source Oral, resp. rate 18, height '5\' 6"'$  (1.676 m), weight 132 lb 6.4 oz (60.1 kg), SpO2 100 %.  LABORATORY DATA: Lab Results  Component Value Date   WBC 5.5 08/07/2016   HGB 11.7 (L) 08/07/2016   HCT 37.7 (L) 08/07/2016   MCV 90.8 08/07/2016   PLT 368 08/07/2016      Chemistry      Component Value Date/Time   NA 138 07/24/2016 1216   K 3.6 07/24/2016 1216   CL 100 (L) 03/10/2016 2110   CO2 26 07/24/2016 1216   BUN 11.7 07/24/2016 1216   CREATININE 0.9 07/24/2016 1216      Component Value Date/Time   CALCIUM 9.2 07/24/2016 1216   ALKPHOS 62 07/24/2016 1216   AST 9 07/24/2016 1216   ALT 8 07/24/2016 1216   BILITOT 0.22 07/24/2016 1216       RADIOGRAPHIC STUDIES: No results found.  ASSESSMENT AND PLAN:  This is a very pleasant 76 years old African-American male with metastatic non-small cell lung cancer, adenocarcinoma status post several chemotherapy regimens and he is currently on treatment with single agent gemcitabine status post 3 cycles. He tolerated the last cycle of his treatment well with no significant adverse effects except for fatigue. I recommended for him to proceed with cycle #4 today as scheduled. I will arrange for the patient to have repeat CT scan of the chest, abdomen and pelvis for restaging of his disease before starting cycle #5. For the cough, I gave the patient refill of Tussionex today. For the lack of appetite and weight loss, he will continue on prednisone 10 mg by mouth daily. For  constipation, he was given a refill for Senokot. The patient was advised to call immediately if he has any concerning symptoms in the interval. The patient voices understanding of current disease status and treatment options and is in agreement with the current care plan.  All questions were answered. The patient knows to call the clinic with any problems, questions or concerns. We can certainly see the patient much sooner if necessary. I spent 10 minutes counseling the patient face to face. The total time spent in the appointment was 15 minutes.  Disclaimer: This note was dictated with voice recognition software. Similar sounding words can inadvertently be transcribed and may not be corrected upon review.

## 2016-08-07 NOTE — Telephone Encounter (Signed)
Per 2/6 LOS and staff message I have scheduled appts and gave calendar. Notified the scheduler  

## 2016-08-07 NOTE — Patient Instructions (Signed)
[facility] Discharge Instructions for Patients Receiving Chemotherapy  Today you received the following chemotherapy agents:  gemzar  To help prevent nausea and vomiting after your treatment, we encourage you to take your nausea medication.   If you develop nausea and vomiting that is not controlled by your nausea medication, call the clinic.   BELOW ARE SYMPTOMS THAT SHOULD BE REPORTED IMMEDIATELY:  *FEVER GREATER THAN 100.5 F  *CHILLS WITH OR WITHOUT FEVER  NAUSEA AND VOMITING THAT IS NOT CONTROLLED WITH YOUR NAUSEA MEDICATION  *UNUSUAL SHORTNESS OF BREATH  *UNUSUAL BRUISING OR BLEEDING  TENDERNESS IN MOUTH AND THROAT WITH OR WITHOUT PRESENCE OF ULCERS  *URINARY PROBLEMS  *BOWEL PROBLEMS  UNUSUAL RASH Items with * indicate a potential emergency and should be followed up as soon as possible.  Feel free to call the clinic you have any questions or concerns. The clinic phone number is [phone].  Please show the [place] at check-in to the Emergency Department and triage nurse.

## 2016-08-13 ENCOUNTER — Other Ambulatory Visit: Payer: Self-pay | Admitting: *Deleted

## 2016-08-13 DIAGNOSIS — C3411 Malignant neoplasm of upper lobe, right bronchus or lung: Secondary | ICD-10-CM

## 2016-08-14 ENCOUNTER — Telehealth: Payer: Self-pay | Admitting: Internal Medicine

## 2016-08-14 ENCOUNTER — Ambulatory Visit (HOSPITAL_BASED_OUTPATIENT_CLINIC_OR_DEPARTMENT_OTHER): Payer: Medicare HMO

## 2016-08-14 ENCOUNTER — Other Ambulatory Visit (HOSPITAL_BASED_OUTPATIENT_CLINIC_OR_DEPARTMENT_OTHER): Payer: Medicare HMO

## 2016-08-14 VITALS — BP 101/64 | HR 95 | Temp 98.2°F | Resp 18

## 2016-08-14 DIAGNOSIS — Z5111 Encounter for antineoplastic chemotherapy: Secondary | ICD-10-CM | POA: Diagnosis not present

## 2016-08-14 DIAGNOSIS — C3411 Malignant neoplasm of upper lobe, right bronchus or lung: Secondary | ICD-10-CM

## 2016-08-14 LAB — COMPREHENSIVE METABOLIC PANEL
ALT: 11 U/L (ref 0–55)
AST: 8 U/L (ref 5–34)
Albumin: 2.8 g/dL — ABNORMAL LOW (ref 3.5–5.0)
Alkaline Phosphatase: 55 U/L (ref 40–150)
Anion Gap: 10 mEq/L (ref 3–11)
BUN: 13.6 mg/dL (ref 7.0–26.0)
CALCIUM: 9.3 mg/dL (ref 8.4–10.4)
CHLORIDE: 105 meq/L (ref 98–109)
CO2: 24 meq/L (ref 22–29)
CREATININE: 1 mg/dL (ref 0.7–1.3)
EGFR: 86 mL/min/{1.73_m2} — ABNORMAL LOW (ref >90)
Glucose: 146 mg/dl — ABNORMAL HIGH (ref 70–140)
Potassium: 3.5 mEq/L (ref 3.5–5.1)
Sodium: 139 mEq/L (ref 136–145)
Total Bilirubin: 0.22 mg/dL (ref 0.20–1.20)
Total Protein: 6.1 g/dL — ABNORMAL LOW (ref 6.4–8.3)

## 2016-08-14 LAB — CBC WITH DIFFERENTIAL/PLATELET
BASO%: 0.8 % (ref 0.0–2.0)
Basophils Absolute: 0 10*3/uL (ref 0.0–0.1)
EOS%: 0.4 % (ref 0.0–7.0)
Eosinophils Absolute: 0 10*3/uL (ref 0.0–0.5)
HEMATOCRIT: 33.7 % — AB (ref 38.4–49.9)
HGB: 11.1 g/dL — ABNORMAL LOW (ref 13.0–17.1)
LYMPH#: 0.4 10*3/uL — AB (ref 0.9–3.3)
LYMPH%: 16.1 % (ref 14.0–49.0)
MCH: 28.8 pg (ref 27.2–33.4)
MCHC: 32.9 g/dL (ref 32.0–36.0)
MCV: 87.5 fL (ref 79.3–98.0)
MONO#: 0.4 10*3/uL (ref 0.1–0.9)
MONO%: 16.1 % — ABNORMAL HIGH (ref 0.0–14.0)
NEUT%: 66.6 % (ref 39.0–75.0)
NEUTROS ABS: 1.7 10*3/uL (ref 1.5–6.5)
PLATELETS: 351 10*3/uL (ref 140–400)
RBC: 3.85 10*6/uL — ABNORMAL LOW (ref 4.20–5.82)
RDW: 18.8 % — AB (ref 11.0–14.6)
WBC: 2.5 10*3/uL — AB (ref 4.0–10.3)

## 2016-08-14 MED ORDER — PROCHLORPERAZINE MALEATE 10 MG PO TABS
ORAL_TABLET | ORAL | Status: AC
  Filled 2016-08-14: qty 1

## 2016-08-14 MED ORDER — GEMCITABINE HCL CHEMO INJECTION 1 GM/26.3ML
1000.0000 mg/m2 | Freq: Once | INTRAVENOUS | Status: AC
  Administered 2016-08-14: 1710 mg via INTRAVENOUS
  Filled 2016-08-14: qty 44.97

## 2016-08-14 MED ORDER — SODIUM CHLORIDE 0.9 % IV SOLN
Freq: Once | INTRAVENOUS | Status: AC
  Administered 2016-08-14: 10:00:00 via INTRAVENOUS

## 2016-08-14 MED ORDER — PROCHLORPERAZINE MALEATE 10 MG PO TABS
10.0000 mg | ORAL_TABLET | Freq: Once | ORAL | Status: AC
  Administered 2016-08-14: 10 mg via ORAL

## 2016-08-14 NOTE — Telephone Encounter (Signed)
Lab appointment added for today. 08/14/16

## 2016-08-14 NOTE — Patient Instructions (Signed)
[facility] Discharge Instructions for Patients Receiving Chemotherapy  Today you received the following chemotherapy agents Gemzar  To help prevent nausea and vomiting after your treatment, we encourage you to take your nausea medication as prescribed   If you develop nausea and vomiting that is not controlled by your nausea medication, call the clinic.   BELOW ARE SYMPTOMS THAT SHOULD BE REPORTED IMMEDIATELY:  *FEVER GREATER THAN 100.5 F  *CHILLS WITH OR WITHOUT FEVER  NAUSEA AND VOMITING THAT IS NOT CONTROLLED WITH YOUR NAUSEA MEDICATION  *UNUSUAL SHORTNESS OF BREATH  *UNUSUAL BRUISING OR BLEEDING  TENDERNESS IN MOUTH AND THROAT WITH OR WITHOUT PRESENCE OF ULCERS  *URINARY PROBLEMS  *BOWEL PROBLEMS  UNUSUAL RASH Items with * indicate a potential emergency and should be followed up as soon as possible.  Feel free to call the clinic you have any questions or concerns. The clinic phone number is [phone].  Please show the CHEMO ALERT CARD at check-in to the Emergency Department and triage nurse.   

## 2016-08-14 NOTE — Progress Notes (Signed)
Per Dr. Mohamed, it's OK to treat with today's labs.  

## 2016-08-27 ENCOUNTER — Encounter: Payer: Self-pay | Admitting: Oncology

## 2016-08-27 ENCOUNTER — Other Ambulatory Visit (HOSPITAL_BASED_OUTPATIENT_CLINIC_OR_DEPARTMENT_OTHER): Payer: Medicare HMO

## 2016-08-27 ENCOUNTER — Emergency Department (HOSPITAL_COMMUNITY)
Admission: EM | Admit: 2016-08-27 | Discharge: 2016-08-27 | Disposition: A | Discharge status: Home / Self Care | Payer: Medicare HMO | Attending: Emergency Medicine | Admitting: Emergency Medicine

## 2016-08-27 ENCOUNTER — Ambulatory Visit (HOSPITAL_COMMUNITY)
Admission: RE | Admit: 2016-08-27 | Discharge: 2016-08-27 | Disposition: A | Discharge status: Home / Self Care | Payer: Medicare HMO | Source: Ambulatory Visit | Attending: Internal Medicine | Admitting: Internal Medicine

## 2016-08-27 ENCOUNTER — Ambulatory Visit (HOSPITAL_BASED_OUTPATIENT_CLINIC_OR_DEPARTMENT_OTHER): Payer: Medicare HMO | Admitting: Oncology

## 2016-08-27 ENCOUNTER — Telehealth: Payer: Self-pay | Admitting: *Deleted

## 2016-08-27 ENCOUNTER — Other Ambulatory Visit: Payer: Self-pay

## 2016-08-27 ENCOUNTER — Encounter (HOSPITAL_COMMUNITY): Payer: Self-pay

## 2016-08-27 ENCOUNTER — Encounter (HOSPITAL_COMMUNITY): Payer: Self-pay | Admitting: Nurse Practitioner

## 2016-08-27 VITALS — BP 64/48 | HR 90 | Temp 98.1°F | Resp 18 | Wt 130.6 lb

## 2016-08-27 DIAGNOSIS — Z5112 Encounter for antineoplastic immunotherapy: Secondary | ICD-10-CM

## 2016-08-27 DIAGNOSIS — K5900 Constipation, unspecified: Secondary | ICD-10-CM

## 2016-08-27 DIAGNOSIS — Z5111 Encounter for antineoplastic chemotherapy: Secondary | ICD-10-CM | POA: Insufficient documentation

## 2016-08-27 DIAGNOSIS — I951 Orthostatic hypotension: Secondary | ICD-10-CM

## 2016-08-27 DIAGNOSIS — C61 Malignant neoplasm of prostate: Secondary | ICD-10-CM | POA: Insufficient documentation

## 2016-08-27 DIAGNOSIS — E860 Dehydration: Secondary | ICD-10-CM

## 2016-08-27 DIAGNOSIS — N400 Enlarged prostate without lower urinary tract symptoms: Secondary | ICD-10-CM | POA: Insufficient documentation

## 2016-08-27 DIAGNOSIS — C7951 Secondary malignant neoplasm of bone: Secondary | ICD-10-CM | POA: Insufficient documentation

## 2016-08-27 DIAGNOSIS — C3490 Malignant neoplasm of unspecified part of unspecified bronchus or lung: Secondary | ICD-10-CM

## 2016-08-27 DIAGNOSIS — R062 Wheezing: Secondary | ICD-10-CM

## 2016-08-27 DIAGNOSIS — Z87891 Personal history of nicotine dependence: Secondary | ICD-10-CM | POA: Diagnosis not present

## 2016-08-27 DIAGNOSIS — J90 Pleural effusion, not elsewhere classified: Secondary | ICD-10-CM

## 2016-08-27 DIAGNOSIS — I7090 Unspecified atherosclerosis: Secondary | ICD-10-CM

## 2016-08-27 DIAGNOSIS — E039 Hypothyroidism, unspecified: Secondary | ICD-10-CM | POA: Insufficient documentation

## 2016-08-27 DIAGNOSIS — I480 Paroxysmal atrial fibrillation: Secondary | ICD-10-CM | POA: Insufficient documentation

## 2016-08-27 DIAGNOSIS — C3411 Malignant neoplasm of upper lobe, right bronchus or lung: Secondary | ICD-10-CM

## 2016-08-27 DIAGNOSIS — C3491 Malignant neoplasm of unspecified part of right bronchus or lung: Secondary | ICD-10-CM

## 2016-08-27 DIAGNOSIS — I4891 Unspecified atrial fibrillation: Secondary | ICD-10-CM | POA: Diagnosis not present

## 2016-08-27 DIAGNOSIS — R599 Enlarged lymph nodes, unspecified: Secondary | ICD-10-CM

## 2016-08-27 DIAGNOSIS — D649 Anemia, unspecified: Secondary | ICD-10-CM | POA: Insufficient documentation

## 2016-08-27 DIAGNOSIS — Z7982 Long term (current) use of aspirin: Secondary | ICD-10-CM | POA: Diagnosis not present

## 2016-08-27 DIAGNOSIS — R5382 Chronic fatigue, unspecified: Secondary | ICD-10-CM | POA: Diagnosis not present

## 2016-08-27 LAB — CBC WITH DIFFERENTIAL/PLATELET
BASO%: 0 % (ref 0.0–2.0)
BASOS ABS: 0 10*3/uL (ref 0.0–0.1)
BASOS PCT: 0 %
Basophils Absolute: 0 10*3/uL (ref 0.0–0.1)
EOS%: 0.2 % (ref 0.0–7.0)
Eosinophils Absolute: 0 10*3/uL (ref 0.0–0.5)
Eosinophils Absolute: 0 10*3/uL (ref 0.0–0.7)
Eosinophils Relative: 1 %
HCT: 36.5 % — ABNORMAL LOW (ref 38.4–49.9)
HEMATOCRIT: 33.1 % — AB (ref 39.0–52.0)
HGB: 11.8 g/dL — ABNORMAL LOW (ref 13.0–17.1)
Hemoglobin: 10.8 g/dL — ABNORMAL LOW (ref 13.0–17.0)
LYMPH#: 0.7 10*3/uL — AB (ref 0.9–3.3)
LYMPH%: 14.6 % (ref 14.0–49.0)
Lymphocytes Relative: 14 %
Lymphs Abs: 0.7 10*3/uL (ref 0.7–4.0)
MCH: 28.6 pg (ref 27.2–33.4)
MCH: 28.7 pg (ref 26.0–34.0)
MCHC: 32.3 g/dL (ref 32.0–36.0)
MCHC: 32.6 g/dL (ref 30.0–36.0)
MCV: 88.6 fL (ref 79.3–98.0)
MCV: 88 fL (ref 78.0–100.0)
MONO ABS: 0.7 10*3/uL (ref 0.1–1.0)
MONO#: 0.6 10*3/uL (ref 0.1–0.9)
MONO%: 12.6 % (ref 0.0–14.0)
Monocytes Relative: 14 %
NEUT%: 72.6 % (ref 39.0–75.0)
NEUTROS ABS: 3.6 10*3/uL (ref 1.5–6.5)
NEUTROS ABS: 3.5 10*3/uL (ref 1.7–7.7)
Neutrophils Relative %: 71 %
PLATELETS: 367 10*3/uL (ref 150–400)
Platelets: 320 10*3/uL (ref 140–400)
RBC: 4.12 10*6/uL — AB (ref 4.20–5.82)
RBC: 3.76 MIL/uL — AB (ref 4.22–5.81)
RDW: 19.6 % — ABNORMAL HIGH (ref 11.0–14.6)
RDW: 19.6 % — AB (ref 11.5–15.5)
WBC: 4.9 10*3/uL (ref 4.0–10.3)
WBC: 4.9 10*3/uL (ref 4.0–10.5)

## 2016-08-27 LAB — COMPREHENSIVE METABOLIC PANEL
ALBUMIN: 2.9 g/dL — AB (ref 3.5–5.0)
ALK PHOS: 52 U/L (ref 38–126)
ALT: 7 U/L (ref 0–55)
ALT: 10 U/L — ABNORMAL LOW (ref 17–63)
AST: 9 U/L (ref 5–34)
AST: 14 U/L — ABNORMAL LOW (ref 15–41)
Albumin: 2.9 g/dL — ABNORMAL LOW (ref 3.5–5.0)
Alkaline Phosphatase: 67 U/L (ref 40–150)
Anion Gap: 10 mEq/L (ref 3–11)
Anion gap: 4 — ABNORMAL LOW (ref 5–15)
BILIRUBIN TOTAL: 0.52 mg/dL (ref 0.20–1.20)
BILIRUBIN TOTAL: 0.6 mg/dL (ref 0.3–1.2)
BUN: 15.2 mg/dL (ref 7.0–26.0)
BUN: 15 mg/dL (ref 6–20)
CALCIUM: 8.6 mg/dL — AB (ref 8.9–10.3)
CHLORIDE: 102 meq/L (ref 98–109)
CO2: 27 meq/L (ref 22–29)
CO2: 29 mmol/L (ref 22–32)
CREATININE: 1 mg/dL (ref 0.7–1.3)
Calcium: 9.8 mg/dL (ref 8.4–10.4)
Chloride: 105 mmol/L (ref 101–111)
Creatinine, Ser: 1.02 mg/dL (ref 0.61–1.24)
EGFR: 80 mL/min/{1.73_m2} — ABNORMAL LOW (ref >90)
GLUCOSE: 101 mg/dL (ref 70–140)
GLUCOSE: 107 mg/dL — AB (ref 65–99)
Potassium: 4.1 mEq/L (ref 3.5–5.1)
Potassium: 3.4 mmol/L — ABNORMAL LOW (ref 3.5–5.1)
Sodium: 139 mEq/L (ref 136–145)
Sodium: 138 mmol/L (ref 135–145)
TOTAL PROTEIN: 6.5 g/dL (ref 6.4–8.3)
TOTAL PROTEIN: 6 g/dL — AB (ref 6.5–8.1)

## 2016-08-27 LAB — I-STAT TROPONIN, ED: Troponin i, poc: 0.01 ng/mL (ref 0.00–0.08)

## 2016-08-27 LAB — TSH: TSH: 3.205 m[IU]/L (ref 0.320–4.118)

## 2016-08-27 MED ORDER — METOPROLOL SUCCINATE ER 25 MG PO TB24: 25.0000 mg | ORAL_TABLET | Freq: Every day | ORAL | 0 refills | Status: AC

## 2016-08-27 MED ORDER — SODIUM CHLORIDE 0.9 % IV SOLN
INTRAVENOUS | Status: DC
  Administered 2016-08-27: 12:00:00 via INTRAVENOUS

## 2016-08-27 MED ORDER — IOPAMIDOL (ISOVUE-300) INJECTION 61%
100.0000 mL | Freq: Once | INTRAVENOUS | Status: AC | PRN
  Administered 2016-08-27: 100 mL via INTRAVENOUS

## 2016-08-27 MED ORDER — ALBUTEROL SULFATE HFA 108 (90 BASE) MCG/ACT IN AERS: 2.0000 | INHALATION_SPRAY | Freq: Four times a day (QID) | RESPIRATORY_TRACT | 0 refills | Status: AC | PRN

## 2016-08-27 MED ORDER — ALBUTEROL SULFATE (2.5 MG/3ML) 0.083% IN NEBU
INHALATION_SOLUTION | RESPIRATORY_TRACT | Status: AC
  Filled 2016-08-27: qty 3

## 2016-08-27 MED ORDER — METOPROLOL TARTRATE 5 MG/5ML IV SOLN
2.5000 mg | Freq: Once | INTRAVENOUS | Status: AC
  Administered 2016-08-27: 2.5 mg via INTRAVENOUS
  Filled 2016-08-27: qty 5

## 2016-08-27 MED ORDER — APIXABAN 5 MG PO TABS: 5.0000 mg | ORAL_TABLET | Freq: Two times a day (BID) | ORAL | 0 refills | Status: AC

## 2016-08-27 MED ORDER — HEPARIN (PORCINE) IN NACL 100-0.45 UNIT/ML-% IJ SOLN
900.0000 [IU]/h | INTRAMUSCULAR | Status: DC
  Administered 2016-08-27: 900 [IU]/h via INTRAVENOUS
  Filled 2016-08-27: qty 250

## 2016-08-27 MED ORDER — HEPARIN BOLUS VIA INFUSION
3000.0000 [IU] | Freq: Once | INTRAVENOUS | Status: AC
  Administered 2016-08-27: 3000 [IU] via INTRAVENOUS
  Filled 2016-08-27: qty 3000

## 2016-08-27 MED ORDER — SODIUM CHLORIDE 0.9 % IV BOLUS (SEPSIS)
1000.0000 mL | Freq: Once | INTRAVENOUS | Status: AC
  Administered 2016-08-27: 1000 mL via INTRAVENOUS

## 2016-08-27 MED ORDER — ALBUTEROL SULFATE (2.5 MG/3ML) 0.083% IN NEBU
2.5000 mg | INHALATION_SOLUTION | Freq: Once | RESPIRATORY_TRACT | Status: AC
  Administered 2016-08-27: 2.5 mg via RESPIRATORY_TRACT
  Filled 2016-08-27: qty 3

## 2016-08-27 MED ORDER — IOPAMIDOL (ISOVUE-300) INJECTION 61%
INTRAVENOUS | Status: AC
  Filled 2016-08-27: qty 100

## 2016-08-27 MED ORDER — APIXABAN 5 MG PO TABS
5.0000 mg | ORAL_TABLET | Freq: Once | ORAL | Status: AC
  Administered 2016-08-27: 5 mg via ORAL
  Filled 2016-08-27: qty 1

## 2016-08-27 NOTE — Telephone Encounter (Signed)
Pt in the lobby after getting labs done.  Family states he needs 'fluids'. They state his pressure was low over the weekend: Saturday 68./53 but better by Sunday with systolic in the 38'S.  Pt has CT Scan this am and labs. Will have him evaluated  In [facility]. Has appt with Dr. [name] tomorrow am.

## 2016-08-27 NOTE — ED Triage Notes (Signed)
Pt presents to [facility] from [facility] for new onset of afib w/ rvr. 12 lead ekg performed and given to dr. Roxanne Mins.

## 2016-08-27 NOTE — Progress Notes (Signed)
SYMPTOM MANAGEMENT CLINIC    Chief Complaint: Low blood pressure and dizziness  HPI:  Alan [name] 76 y.o. male diagnosed with metastatic non-small cell lung cancer currently undergoing treatment with gemcitabine. The patient was at [facility] earlier today for restaging CT scans and came to our lobby asking to be seen due to a low blood pressure and wanting IV fluids. The patient today tells me that he has had a low blood pressure for about a week. On Saturday he says his blood pressure was in the 70J systolic. He reports being dizzy and "loopy". Denies taking any blood pressure medications and there are no blood pressure medications on his medication list. He denies chest pain and shortness of breath. He reports wheezing at night. He does not have an inhaler or nebulizer at home. Denies fevers. He has not been eating very well in the past week. He remains on prednisone to stimulate his appetite. He uses oxycodone for pain sparingly. He states that he used 3 doses this past Saturday due to left leg pain. Denies abdominal pain, nausea, and vomiting. Reports ongoing constipation and uses Senokot for this. The patient reports that he fell this past weekend at home. States that he sat on the floor and did not injure himself. Uses a cane to help him get around. He reports that he has fallen several times in the past.  Oncology History   Patient presented with gynecomastia.  Work up showed right upper lung mass.  Malignant neoplasm of right upper lobe of lung ([place])   Staging form: Lung, AJCC 7th Edition     Clinical stage from 06/03/2015: Stage IIIA (T1b, N2, M0) - Signed by Curt Bears, MD on 06/03/2015       Malignant neoplasm of right upper lobe of lung ([place])   05/02/2015 Imaging    CT Chest IMPRESSION: 1. Posterior right upper lobe lung nodule, most consistent with primary bronchogenic carcinoma. 2. Mild mediastinal adenopathy, suspicious for nodal metastasis      05/19/2015  Imaging    PET Scan IMPRESSION: 2.8 cm hypermetabolic posterior right upper lobe pulmonary nodule, consistent with primary bronchogenic carcinoma.  Mild hypermetabolic right hilar and right paratracheal lymphadenopathy, suspicious for metastatic disease.      05/22/2015 Surgery    Right upper lobe lung mass and hypermetabolic mediastinal adenopathy      05/31/2015 Pathology Results    1. Lymph node, biopsy, 4R - METASTATIC ADENOCARCINOMA. - SEE COMMENT. 2. Lymph node, biopsy, 2R - METASTATIC ADENOCARCINOMA.      06/03/2015 Initial Diagnosis    Malignant neoplasm of right upper lobe of lung ([place])      06/13/2015 -  Chemotherapy    1st chemo      06/14/2015 -  Radiation Therapy    SIM       Review of Systems  Constitutional: Positive for malaise/fatigue and weight loss. Negative for chills, diaphoresis and fever.  HENT: Negative.   Eyes: Negative.   Respiratory: Positive for cough. Negative for hemoptysis, sputum production, shortness of breath and wheezing.   Cardiovascular: Negative.   Gastrointestinal: Positive for constipation. Negative for abdominal pain, blood in stool, diarrhea, heartburn, melena, nausea and vomiting.  Genitourinary: Negative.   Musculoskeletal: Positive for falls. Negative for back pain, joint pain, myalgias and neck pain.  Skin: Negative.   Neurological: Positive for dizziness and weakness. Negative for tingling, tremors, sensory change, speech change, focal weakness, seizures, loss of consciousness and headaches.  Endo/Heme/Allergies: Negative.  Psychiatric/Behavioral: Negative.     Past Medical History:  Diagnosis Date  . 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 Anxiety state, unspecified   . Benign hypertrophy of prostate   . Benign neoplasm of colon    colon polyps  . Cancer [facility])    prostate cancer  . Chronic fatigue 12/23/2015  . Constipation, chronic   . Cough 06/27/2016  . Dehydration 02/22/2016  . Diverticulosis of  colon (without mention of hemorrhage)   . Dysuria 03/07/2016  . Encounter for antineoplastic chemotherapy 09/29/2015  . Encounter for antineoplastic immunotherapy 12/23/2015  . Erectile dysfunction   . GERD (gastroesophageal reflux disease)   . Glaucoma   . Hypercholesteremia   . Hypothyroidism   . Irritable bowel syndrome   . Left hip pain 10/27/2015  . Malignant pleural effusion    right  . Onychomycosis   . Radiation 06/20/15-08/02/15   right upper lung region 60 gray  . Restless legs syndrome (RLS)   . Shortness of breath at rest 06/27/2016  . Sleep apnea    does not use cpap  . Unspecified essential hypertension     Past Surgical History:  Procedure Laterality Date  . APPENDECTOMY  12/2012  . CARDIAC CATHETERIZATION  ? 2008  . CHEST TUBE INSERTION Right 03/01/2016   Procedure: INSERTION PLEURAL DRAINAGE CATHETER with doxycycline pleurodesis;  Surgeon: Gaye Pollack, MD;  Location: [facility] OR;  Service: Thoracic;  Laterality: Right;  . COLONOSCOPY    . LAPAROSCOPIC APPENDECTOMY N/A 01/27/2013   Procedure: APPENDECTOMY LAPAROSCOPIC;  Surgeon: Edward Jolly, MD;  Location: [facility] ORS;  Service: General;  Laterality: N/A;  . MEDIASTINOSCOPY N/A 05/31/2015   Procedure: MEDIASTINOSCOPY;  Surgeon: Gaye Pollack, MD;  Location: [place];  Service: Thoracic;  Laterality: N/A;  . none    . REMOVAL OF PLEURAL DRAINAGE CATHETER Right 04/04/2016   Procedure: REMOVAL OF PLEURAL DRAINAGE CATHETER;  Surgeon: Gaye Pollack, MD;  Location: [place];  Service: Thoracic;  Laterality: Right;  . THYROIDECTOMY    . TONSILLECTOMY    . VIDEO BRONCHOSCOPY N/A 05/31/2015   Procedure: VIDEO BRONCHOSCOPY;  Surgeon: Gaye Pollack, MD;  Location: [place] OR;  Service: Thoracic;  Laterality: N/A;    has ONYCHOMYCOSIS; COLONIC POLYPS; HYPERCHOLESTEROLEMIA; Anxiety state; ERECTILE DYSFUNCTION; OBSTRUCTIVE SLEEP APNEA; RESTLESS LEG SYNDROME; Essential hypertension; GERD; Diverticulosis of large intestine; IRRITABLE BOWEL  SYNDROME; MEMORY LOSS; CHEST WALL PAIN, ANTERIOR; BPH (benign prostatic hypertrophy) with urinary obstruction; Gynecomastia, male; Decreased hearing; Prostate cancer ([place]); Postablative hypothyroidism; Constipation; Malignant neoplasm of right upper lobe of lung ([place]); Encounter for antineoplastic chemotherapy; Left hip pain; Chronic fatigue; Encounter for antineoplastic immunotherapy; Bone metastasis ([place]); Pleural effusion; Dehydration; Cough; Shortness of breath at rest; and Antineoplastic chemotherapy induced anemia on his problem list.    is allergic to codeine.  Allergies as of 08/27/2016      Reactions   Codeine Other (See Comments)   : hallucinations,faint,dizziness      Medication List       Accurate as of 08/27/16  3:32 PM. Always use your most recent med list.          acetaminophen 500 MG tablet Commonly known as:  TYLENOL Take 1,000 mg by mouth every 4 (four) hours as needed for mild pain, moderate pain, fever or headache.   albuterol 108 (90 Base) MCG/ACT inhaler Commonly known as:  PROVENTIL HFA;VENTOLIN HFA Inhale 2 puffs into the lungs every 6 (six) hours as needed for wheezing or shortness of breath.  aspirin EC 81 MG tablet Take 81 mg by mouth daily.   CENTRUM SILVER 50+MEN Tabs Take 1 tablet by mouth daily.   chlorpheniramine-HYDROcodone 10-8 MG/5ML Suer Commonly known as:  TUSSIONEX Take 5 mLs by mouth every 12 (twelve) hours as needed for cough.   chlorproMAZINE 25 MG tablet Commonly known as:  THORAZINE Take 1 tablet (25 mg total) by mouth 3 (three) times daily as needed for hiccoughs.   COMBIGAN 0.2-0.5 % ophthalmic solution Generic drug:  brimonidine-timolol Place 1 drop into both eyes every 12 (twelve) hours.   dronabinol 2.5 MG capsule Commonly known as:  MARINOL Take 1 capsule (2.5 mg total) by mouth 2 (two) times daily before a meal. for appetite   finasteride 5 MG tablet Commonly known as:  [name] Take 1 tablet (5 mg total) by mouth  daily.   fluticasone 50 MCG/ACT nasal spray Commonly known as:  FLONASE Place 2 sprays into both nostrils daily as needed for allergies or rhinitis.   lactulose 10 GM/15ML solution Commonly known as:  CHRONULAC Take 10 g by mouth 2 (two) times daily as needed (constipation).   levothyroxine 125 MCG tablet Commonly known as:  SYNTHROID, LEVOTHROID Take 1 tablet (125 mcg total) by mouth daily.   LINZESS 290 MCG Caps capsule Generic drug:  linaclotide TAKE ONE CAPSULE BY MOUTH ONCE DAILY   meloxicam 7.5 MG tablet Commonly known as:  MOBIC Take 1 tablet (7.5 mg total) by mouth daily.   mirtazapine 30 MG tablet Commonly known as:  REMERON Take 1 tablet (30 mg total) by mouth at bedtime.   omeprazole 20 MG capsule Commonly known as:  PRILOSEC Take 1 capsule (20 mg total) by mouth every morning.   oxyCODONE-acetaminophen 5-325 MG tablet Commonly known as:  PERCOCET/ROXICET Take 1 tablet by mouth every 4 (four) hours as needed for severe pain.   potassium chloride SA 20 MEQ tablet Commonly known as:  K-DUR,KLOR-CON Take 1 tablet (20 mEq total) by mouth 2 (two) times daily.   predniSONE 10 MG tablet Commonly known as:  DELTASONE Take 1 tablet (10 mg total) by mouth daily.   prochlorperazine 10 MG tablet Commonly known as:  COMPAZINE TAKE 1 TABLET BY MOUTH EVERY 6 HOURS AS NEEDED FOR NAUSEA AND VOMITING   senna-docusate 8.6-50 MG tablet Commonly known as:  Senokot-S Take 2 tablets by mouth 2 (two) times daily.   simvastatin 20 MG tablet Commonly known as:  ZOCOR TAKE 1 TABLET BY MOUTH AT BEDTIME   sucralfate 1 g tablet Commonly known as:  CARAFATE Take 1 tablet (1 g total) by mouth 4 (four) times daily -  with meals and at bedtime. Mix in 2Tbs water, swish and swallow   tamsulosin 0.4 MG Caps capsule Commonly known as:  FLOMAX Take 2 capsules (0.8 mg total) by mouth at bedtime.   traMADol 50 MG tablet Commonly known as:  ULTRAM Take 1 tablet (50 mg total) by  mouth every 6 (six) hours as needed for moderate pain.   VITAMIN D PO Take 500 Units by mouth daily.        PHYSICAL EXAMINATION  Oncology Vitals 08/27/2016 08/27/2016  Height - -  Weight - -  Weight (lbs) - -  BMI (kg/m2) - -  Temp - -  Pulse 90 50  Resp 18 18  SpO2 - -  BSA (m2) - -   BP Readings from Last 2 Encounters:  08/27/16 (!) 64/48  08/14/16 101/64    Physical Exam  Constitutional:  He is oriented to person, place, and time and well-developed, well-nourished, and in no distress. No distress.  HENT:  Head: Normocephalic and atraumatic.  Mouth/Throat: No oropharyngeal exudate.  Eyes: Conjunctivae and EOM are normal. Pupils are equal, round, and reactive to light. No scleral icterus.  Neck: Normal range of motion. Neck supple. No tracheal deviation present. No thyromegaly present.  Cardiovascular: Normal rate and regular rhythm.   Cardiac exam was normal at the beginning of the visit. The liver, after IV fluids his heart rate was noted to be elevated and irregular. An EKG was obtained showed atrial fibrillation with rapid ventricular response. Ventricular rate was 149.  Pulmonary/Chest: Effort normal. No respiratory distress. He has wheezes. He has no rales. He exhibits no tenderness.  Abdominal: Soft. Bowel sounds are normal. He exhibits no distension and no mass. There is no tenderness. There is no rebound and no guarding.  Musculoskeletal: Normal range of motion. He exhibits no edema.  Lymphadenopathy:    He has no cervical adenopathy.  Neurological: He is alert and oriented to person, place, and time.  Skin: Skin is warm and dry. No rash noted. He is not diaphoretic. No erythema. No pallor.  Psychiatric: Mood, memory, affect and judgment normal.  Vitals reviewed.   LABORATORY DATA:. Appointment on 08/27/2016  Component Date Value Ref Range Status  . TSH 08/27/2016 3.205  0.320 - 4.118 m(IU)/L Final  . WBC 08/27/2016 4.9  4.0 - 10.3 10e3/uL Final  . NEUT#  08/27/2016 3.6  1.5 - 6.5 10e3/uL Final  . HGB 08/27/2016 11.8* 13.0 - 17.1 g/dL Final  . HCT 08/27/2016 36.5* 38.4 - 49.9 % Final  . Platelets 08/27/2016 320  140 - 400 10e3/uL Final  . MCV 08/27/2016 88.6  79.3 - 98.0 fL Final  . MCH 08/27/2016 28.6  27.2 - 33.4 pg Final  . MCHC 08/27/2016 32.3  32.0 - 36.0 g/dL Final  . RBC 08/27/2016 4.12* 4.20 - 5.82 10e6/uL Final  . RDW 08/27/2016 19.6* 11.0 - 14.6 % Final  . lymph# 08/27/2016 0.7* 0.9 - 3.3 10e3/uL Final  . MONO# 08/27/2016 0.6  0.1 - 0.9 10e3/uL Final  . Eosinophils Absolute 08/27/2016 0.0  0.0 - 0.5 10e3/uL Final  . Basophils Absolute 08/27/2016 0.0  0.0 - 0.1 10e3/uL Final  . NEUT% 08/27/2016 72.6  39.0 - 75.0 % Final  . LYMPH% 08/27/2016 14.6  14.0 - 49.0 % Final  . MONO% 08/27/2016 12.6  0.0 - 14.0 % Final  . EOS% 08/27/2016 0.2  0.0 - 7.0 % Final  . BASO% 08/27/2016 0.0  0.0 - 2.0 % Final  . Sodium 08/27/2016 139  136 - 145 mEq/L Final  . Potassium 08/27/2016 4.1  3.5 - 5.1 mEq/L Final  . Chloride 08/27/2016 102  98 - 109 mEq/L Final  . CO2 08/27/2016 27  22 - 29 mEq/L Final  . Glucose 08/27/2016 101  70 - 140 mg/dl Final  . BUN 08/27/2016 15.2  7.0 - 26.0 mg/dL Final  . Creatinine 08/27/2016 1.0  0.7 - 1.3 mg/dL Final  . Total Bilirubin 08/27/2016 0.52  0.20 - 1.20 mg/dL Final  . Alkaline Phosphatase 08/27/2016 67  40 - 150 U/L Final  . AST 08/27/2016 9  5 - 34 U/L Final  . ALT 08/27/2016 7  0 - 55 U/L Final  . Total Protein 08/27/2016 6.5  6.4 - 8.3 g/dL Final  . Albumin 08/27/2016 2.9* 3.5 - 5.0 g/dL Final  . Calcium 08/27/2016 9.8  8.4 -  10.4 mg/dL Final  . Anion Gap 08/27/2016 10  3 - 11 mEq/L Final  . EGFR 08/27/2016 80* >90 ml/min/1.73 m2 Final    RADIOGRAPHIC STUDIES: Ct Chest W Contrast  Result Date: 08/27/2016 CLINICAL DATA:  Lung cancer diagnosed in 2016. Prostate cancer diagnosed in 2015. Chemotherapy in progress. Prior radiation therapy. EXAM: CT CHEST, ABDOMEN, AND PELVIS WITH CONTRAST TECHNIQUE:  Multidetector CT imaging of the chest, abdomen and pelvis was performed following the standard protocol during bolus administration of intravenous contrast. CONTRAST:  190m ISOVUE-300 IOPAMIDOL (ISOVUE-300) INJECTION 61% COMPARISON:  Multiple exams, including 06/27/2016 FINDINGS: CT CHEST FINDINGS Cardiovascular: Atherosclerotic calcification of the aortic arch. Mediastinum/Nodes: A single index rim enhancing subcarinal node measures 1.8 cm in short axis on image 30/2, formerly the same by my measurements. A lymph node anterior to the left pulmonary artery measures 1.3 cm in short axis on image 27/2, previously 1.2 cm by my measurements. Lungs/Pleura: The dominant medial right upper lobe mass measures 12.2 by 5.9 cm on image 21/2, formerly 7.4 by 4.2 cm. There is a much larger and now extends along the medial border of the anterior mediastinum on the right. Extensive pleural metastatic disease on the right along with various nodules scattered in the right lung. An index nodule in the right middle lobe measures 2.5 by 2.0 cm on image 78/6, formerly 1.7 by 1.3 cm. Increased size of left-sided pulmonary nodules which are scattered. A nodule along the left hemidiaphragm measures 1.3 by 1.5 cm on image 123/6, formerly 0.9 by 1.1 cm. I do not see any new left-sided pulmonary nodules. Small bilateral pleural effusions are present ; the right pleural effusion is certainly malignant and the left pleural effusion is probably malignant. There is nodularity in the right pleural adipose tissue but without overt well-defined invasion of the chest wall. Musculoskeletal: Mild superior endplate concavities at the T2, T3, and T4 levels likely from subtle remote endplate compressions. Stable sclerosis along the anterior inferior endplate of T9. CT ABDOMEN PELVIS FINDINGS Hepatobiliary: Several peripheral areas of the liver demonstrate faintly accentuated enhancement on the early phase initial images, for example anteriorly in the  dome of the liver on image 31 of series 4. This is most likely to be due to transient hepatic attenuation difference or vascular shunting, but strictly speaking early metastatic disease is not readily excluded. Pancreas: Unremarkable Spleen: Unremarkable Adrenals/Urinary Tract: Stable fullness of the left adrenal gland, fairly low density and measuring 1 cm in diameter, this was present and not very hypermetabolic on prior PET-CT and I favor this is being a small adenoma rather than malignancy. Bilateral renal cysts are present. Small cellule or diverticulum of the right posterior urinary bladder. Stomach/Bowel: Unremarkable Vascular/Lymphatic: Aortoiliac atherosclerotic vascular disease. No pathologic adenopathy in the abdomen or pelvis identified. Reproductive: Enlarged median lobe of the prostate indents the bladder base. Prostate gland measures 7.4 by 4.6 by 4.1 cm (volume = 73 cm^3). Other: Small but abnormal amount of free pelvic fluid. Cause uncertain. Low grade stranding in the mesentery. Musculoskeletal: Essentially stable appearance of mixed lytic and sclerotic metastatic lesion at the right anterior acetabulum extending into the right superior pubic ramus. Suspected stress fracture of the right inferior pubic ramus, likewise stable. IMPRESSION: 1. Enlarging dominant medial lung mass, currently measuring 12.2 by 5.9 cm (previously 7.4 by 4.2 cm). Enlarging bilateral pulmonary nodules. Considerable pleural metastatic disease on the right. Small bilateral pleural effusions. 2. Stable appearance of mixed density lytic and sclerotic metastatic lesion of the right anterior  acetabulum and right superior pubic ramus. Stable stress fracture in the right inferior pubic ramus. 3. Sclerotic lesion along the anterior inferior endplate of T9 appears stable and is likely metastatic. 4. Essentially stable adenopathy in the chest. 5. Atherosclerosis. 6. The subtle superior endplate concavity at T2, T3, and T4, likely from  remote mild endplate compressions. 7. There are several peripheral areas in the liver demonstrating faintly accentuated enhancement on the early phase initial images. These are probably from vascular shunting, but merit attention on follow up studies. 8. Enlarged prostate gland, measured at 73 cubic cm in volume. 9. Small but abnormal amount of free pelvic fluid. This is primarily anterior to the rectum. Electronically Signed   By: Van Clines M.D.   On: 08/27/2016 10:37   Ct Abdomen Pelvis W Contrast  Result Date: 08/27/2016 CLINICAL DATA:  Lung cancer diagnosed in 2016. Prostate cancer diagnosed in 2015. Chemotherapy in progress. Prior radiation therapy. EXAM: CT CHEST, ABDOMEN, AND PELVIS WITH CONTRAST TECHNIQUE: Multidetector CT imaging of the chest, abdomen and pelvis was performed following the standard protocol during bolus administration of intravenous contrast. CONTRAST:  134m ISOVUE-300 IOPAMIDOL (ISOVUE-300) INJECTION 61% COMPARISON:  Multiple exams, including 06/27/2016 FINDINGS: CT CHEST FINDINGS Cardiovascular: Atherosclerotic calcification of the aortic arch. Mediastinum/Nodes: A single index rim enhancing subcarinal node measures 1.8 cm in short axis on image 30/2, formerly the same by my measurements. A lymph node anterior to the left pulmonary artery measures 1.3 cm in short axis on image 27/2, previously 1.2 cm by my measurements. Lungs/Pleura: The dominant medial right upper lobe mass measures 12.2 by 5.9 cm on image 21/2, formerly 7.4 by 4.2 cm. There is a much larger and now extends along the medial border of the anterior mediastinum on the right. Extensive pleural metastatic disease on the right along with various nodules scattered in the right lung. An index nodule in the right middle lobe measures 2.5 by 2.0 cm on image 78/6, formerly 1.7 by 1.3 cm. Increased size of left-sided pulmonary nodules which are scattered. A nodule along the left hemidiaphragm measures 1.3 by 1.5 cm on  image 123/6, formerly 0.9 by 1.1 cm. I do not see any new left-sided pulmonary nodules. Small bilateral pleural effusions are present ; the right pleural effusion is certainly malignant and the left pleural effusion is probably malignant. There is nodularity in the right pleural adipose tissue but without overt well-defined invasion of the chest wall. Musculoskeletal: Mild superior endplate concavities at the T2, T3, and T4 levels likely from subtle remote endplate compressions. Stable sclerosis along the anterior inferior endplate of T9. CT ABDOMEN PELVIS FINDINGS Hepatobiliary: Several peripheral areas of the liver demonstrate faintly accentuated enhancement on the early phase initial images, for example anteriorly in the dome of the liver on image 31 of series 4. This is most likely to be due to transient hepatic attenuation difference or vascular shunting, but strictly speaking early metastatic disease is not readily excluded. Pancreas: Unremarkable Spleen: Unremarkable Adrenals/Urinary Tract: Stable fullness of the left adrenal gland, fairly low density and measuring 1 cm in diameter, this was present and not very hypermetabolic on prior PET-CT and I favor this is being a small adenoma rather than malignancy. Bilateral renal cysts are present. Small cellule or diverticulum of the right posterior urinary bladder. Stomach/Bowel: Unremarkable Vascular/Lymphatic: Aortoiliac atherosclerotic vascular disease. No pathologic adenopathy in the abdomen or pelvis identified. Reproductive: Enlarged median lobe of the prostate indents the bladder base. Prostate gland measures 7.4 by 4.6 by 4.1  cm (volume = 73 cm^3). Other: Small but abnormal amount of free pelvic fluid. Cause uncertain. Low grade stranding in the mesentery. Musculoskeletal: Essentially stable appearance of mixed lytic and sclerotic metastatic lesion at the right anterior acetabulum extending into the right superior pubic ramus. Suspected stress fracture of  the right inferior pubic ramus, likewise stable. IMPRESSION: 1. Enlarging dominant medial lung mass, currently measuring 12.2 by 5.9 cm (previously 7.4 by 4.2 cm). Enlarging bilateral pulmonary nodules. Considerable pleural metastatic disease on the right. Small bilateral pleural effusions. 2. Stable appearance of mixed density lytic and sclerotic metastatic lesion of the right anterior acetabulum and right superior pubic ramus. Stable stress fracture in the right inferior pubic ramus. 3. Sclerotic lesion along the anterior inferior endplate of T9 appears stable and is likely metastatic. 4. Essentially stable adenopathy in the chest. 5. Atherosclerosis. 6. The subtle superior endplate concavity at T2, T3, and T4, likely from remote mild endplate compressions. 7. There are several peripheral areas in the liver demonstrating faintly accentuated enhancement on the early phase initial images. These are probably from vascular shunting, but merit attention on follow up studies. 8. Enlarged prostate gland, measured at 73 cubic cm in volume. 9. Small but abnormal amount of free pelvic fluid. This is primarily anterior to the rectum. Electronically Signed   By: Van Clines M.D.   On: 08/27/2016 10:37    ASSESSMENT/PLAN:    No problem-specific Assessment & Plan notes found for this encounter.  This is a 76 year old gentleman with metastatic non-small cell lung cancer. He was seen in our office today due to a low blood pressure and requesting IV fluids. Upon arrival, the patient's blood pressure sitting was 81/49 and standing dropped to 64/49. He reported feeling dizzy. IV fluids were given to the patient to try to bring his blood pressure up. He was instructed to hold his [name] and Flomax as both of these medications can cause orthostasis. Once his IV fluids were completed the patient ambulated with assistance to the bathroom and upon returning was noted to be wheezing more. He was given albuterol nebulizer  which did not help his wheezing. The patient's pressure did not improve after the IV fluids. He continues state that he felt "loopy". Heart rate was noted to be elevated and irregular. An EKG was obtained which showed atrial fibrillation with rapid ventricular response. Recommend that the patient be sent to the emergency room for further evaluation of his A. Fib. The patient and his family member were in agreement with this.  Dr. [name] made aware of the need to transfer the patient to the emergency room. Of note, CT scans from earlier today have not been reviewed with the patient and his family. The patient had an appointment with Dr. [name] scheduled for January 27 to review these results.  Patient stated understanding of all instructions; and was in agreement with this plan of care. The patient knows to call the clinic with any problems, questions or concerns.   Total time spent with patient was 50 minutes;  with greater than 50 percent of that time spent in face to face counseling regarding patient's symptoms,  and coordination of care and follow up.   Mikey Bussing, NP 08/27/2016

## 2016-08-27 NOTE — ED Provider Notes (Signed)
[facility] DEPT Provider Note   CSN: [id number] Arrival date & time: 08/27/16  1517     History   Chief Complaint No chief complaint on file.   HPI Alan [name] is a 76 y.o. male.  He is currently being treated for lung cancer and had a near-syncopal episode and doctor's office following having CT scan of his chest, abdomen, pelvis today. Blood pressure is noted to be exceedingly low and heart rate fast, so he was transferred to the ED. He states that he has noted his heart fluttering intermittently for the last 4 days. He is also been having dizziness and lightheadedness during the same time period. He denies chest pain, heaviness, tightness, pressure. He denies nausea or vomiting. He has never had any heart problems previously. He is feeling generally weak, but he relates that to his going through chemotherapy.   The history is provided by the patient.  Atrial Fibrillation     Past Medical History:  Diagnosis Date  . 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 Anxiety state, unspecified   . Benign hypertrophy of prostate   . Benign neoplasm of colon    colon polyps  . Cancer [facility])    prostate cancer  . Chronic fatigue 12/23/2015  . Constipation, chronic   . Cough 06/27/2016  . Dehydration 02/22/2016  . Diverticulosis of colon (without mention of hemorrhage)   . Dysuria 03/07/2016  . Encounter for antineoplastic chemotherapy 09/29/2015  . Encounter for antineoplastic immunotherapy 12/23/2015  . Erectile dysfunction   . GERD (gastroesophageal reflux disease)   . Glaucoma   . Hypercholesteremia   . Hypothyroidism   . Irritable bowel syndrome   . Left hip pain 10/27/2015  . Malignant pleural effusion    right  . Onychomycosis   . Radiation 06/20/15-08/02/15   right upper lung region 60 gray  . Restless legs syndrome (RLS)   . Shortness of breath at rest 06/27/2016  . Sleep apnea    does not use cpap  . Unspecified essential hypertension      Patient Active Problem List   Diagnosis Date Noted  . Cough 06/27/2016  . Shortness of breath at rest 06/27/2016  . 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 Dehydration 02/22/2016  . Pleural effusion 02/13/2016  . Bone metastasis ([place]) 02/08/2016  . Chronic fatigue 12/23/2015  . Encounter for antineoplastic immunotherapy 12/23/2015  . Left hip pain 10/27/2015  . Encounter for antineoplastic chemotherapy 09/29/2015  . Malignant neoplasm of right upper lobe of lung ([place]) 06/03/2015  . Constipation 04/21/2015  . 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 Gynecomastia, male 10/20/2014  . Decreased hearing 10/20/2014  . Prostate cancer ([place]) 10/20/2014  . CHEST WALL PAIN, ANTERIOR 05/29/2010  . ONYCHOMYCOSIS 10/15/2008  . Anxiety state 10/15/2008  . 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 OBSTRUCTIVE SLEEP APNEA 09/21/2007  . RESTLESS LEG SYNDROME 09/21/2007  . Diverticulosis of large intestine 09/21/2007  . HYPERCHOLESTEROLEMIA 09/11/2007  . ERECTILE DYSFUNCTION 09/11/2007  . Essential hypertension 09/11/2007  . GERD 09/11/2007  . IRRITABLE BOWEL SYNDROME 09/11/2007  . BPH (benign prostatic hypertrophy) with urinary obstruction 09/11/2007  . COLONIC POLYPS 10/30/2005    Past Surgical History:  Procedure Laterality Date  . APPENDECTOMY  12/2012  . CARDIAC CATHETERIZATION  ? 2008  . CHEST TUBE INSERTION Right 03/01/2016   Procedure: INSERTION PLEURAL DRAINAGE CATHETER with doxycycline pleurodesis;  Surgeon: Gaye Pollack, MD;  Location: [facility] OR;  Service: Thoracic;  Laterality: Right;  . COLONOSCOPY    . LAPAROSCOPIC  APPENDECTOMY N/A 01/27/2013   Procedure: APPENDECTOMY LAPAROSCOPIC;  Surgeon: Edward Jolly, MD;  Location: [facility] ORS;  Service: General;  Laterality: N/A;  . MEDIASTINOSCOPY N/A 05/31/2015   Procedure: MEDIASTINOSCOPY;  Surgeon: Gaye Pollack, MD;  Location: [facility];  Service: Thoracic;  Laterality: N/A;  . none    . REMOVAL OF PLEURAL DRAINAGE CATHETER  Right 04/04/2016   Procedure: REMOVAL OF PLEURAL DRAINAGE CATHETER;  Surgeon: Gaye Pollack, MD;  Location: [facility];  Service: Thoracic;  Laterality: Right;  . THYROIDECTOMY    . TONSILLECTOMY    . VIDEO BRONCHOSCOPY N/A 05/31/2015   Procedure: VIDEO BRONCHOSCOPY;  Surgeon: Gaye Pollack, MD;  Location: [facility] OR;  Service: Thoracic;  Laterality: N/A;       Home Medications    Prior to Admission medications   Medication Sig Start Date End Date Taking? Authorizing Provider  acetaminophen (TYLENOL) 500 MG tablet Take 1,000 mg by mouth every 4 (four) hours as needed for mild pain, moderate pain, fever or headache.     Historical Provider, MD  albuterol (PROVENTIL HFA;VENTOLIN HFA) 108 (90 Base) MCG/ACT inhaler Inhale 2 puffs into the lungs every 6 (six) hours as needed for wheezing or shortness of breath. 08/27/16   Maryanna Shape, NP  aspirin EC 81 MG tablet Take 81 mg by mouth daily.     Historical Provider, MD  brimonidine-timolol (COMBIGAN) 0.2-0.5 % ophthalmic solution Place 1 drop into both eyes every 12 (twelve) hours.    Historical Provider, MD  chlorpheniramine-HYDROcodone (TUSSIONEX) 10-8 MG/5ML SUER Take 5 mLs by mouth every 12 (twelve) hours as needed for cough. 08/07/16   Curt Bears, MD  chlorproMAZINE (THORAZINE) 25 MG tablet Take 1 tablet (25 mg total) by mouth 3 (three) times daily as needed for hiccoughs. Patient not taking: Reported on 08/27/2016 05/08/16   Curt Bears, MD  Cholecalciferol (VITAMIN D PO) Take 500 Units by mouth daily.    Historical Provider, MD  dronabinol (MARINOL) 2.5 MG capsule Take 1 capsule (2.5 mg total) by mouth 2 (two) times daily before a meal. for appetite 03/23/16   Curt Bears, MD  finasteride ([name]) 5 MG tablet Take 1 tablet (5 mg total) by mouth daily. 05/23/16   [name], MD  fluticasone (FLONASE) 50 MCG/ACT nasal spray Place 2 sprays into both nostrils daily as needed for allergies or rhinitis. 11/17/15   [name], MD   lactulose (CHRONULAC) 10 GM/15ML solution Take 10 g by mouth 2 (two) times daily as needed (constipation).     Historical Provider, MD  levothyroxine (SYNTHROID, LEVOTHROID) 125 MCG tablet Take 1 tablet (125 mcg total) by mouth daily. 07/30/16   Philemon Kingdom, MD  LINZESS 290 MCG CAPS capsule TAKE ONE CAPSULE BY MOUTH ONCE DAILY 03/01/16   Mauri Pole, MD  meloxicam (MOBIC) 7.5 MG tablet Take 1 tablet (7.5 mg total) by mouth daily. 11/17/15   [name], MD  mirtazapine (REMERON) 30 MG tablet Take 1 tablet (30 mg total) by mouth at bedtime. Patient not taking: Reported on 07/17/2016 04/02/16   Curt Bears, MD  Multiple Vitamins-Minerals (CENTRUM SILVER 50+MEN) TABS Take 1 tablet by mouth daily.    Historical Provider, MD  omeprazole (PRILOSEC) 20 MG capsule Take 1 capsule (20 mg total) by mouth every morning. Patient not taking: Reported on 08/27/2016 04/06/16   [name], MD  oxyCODONE-acetaminophen (PERCOCET/ROXICET) 5-325 MG tablet Take 1 tablet by mouth every 4 (four) hours as needed for severe pain.  07/03/16   Curt Bears, MD  potassium chloride SA (K-DUR,KLOR-CON) 20 MEQ tablet Take 1 tablet (20 mEq total) by mouth 2 (two) times daily. Patient not taking: Reported on 08/27/2016 06/27/16   Curt Bears, MD  predniSONE (DELTASONE) 10 MG tablet Take 1 tablet (10 mg total) by mouth daily. 08/07/16   Curt Bears, MD  prochlorperazine (COMPAZINE) 10 MG tablet TAKE 1 TABLET BY MOUTH EVERY 6 HOURS AS NEEDED FOR NAUSEA AND VOMITING Patient not taking: Reported on 08/07/2016 07/03/16   Curt Bears, MD  senna-docusate (SENOKOT-S) 8.6-50 MG tablet Take 2 tablets by mouth 2 (two) times daily. 08/07/16   Curt Bears, MD  simvastatin (ZOCOR) 20 MG tablet TAKE 1 TABLET BY MOUTH AT BEDTIME Patient not taking: Reported on 08/27/2016 03/28/16   [name], MD  sucralfate (CARAFATE) 1 g tablet Take 1 tablet (1 g total) by mouth 4 (four) times daily -  with meals and at bedtime. Mix in  2Tbs water, swish and swallow Patient not taking: Reported on 08/07/2016 08/01/15   Hayden Pedro, PA-C  tamsulosin (FLOMAX) 0.4 MG CAPS capsule Take 2 capsules (0.8 mg total) by mouth at bedtime. 11/17/15   [name], MD  traMADol (ULTRAM) 50 MG tablet Take 1 tablet (50 mg total) by mouth every 6 (six) hours as needed for moderate pain. Patient not taking: Reported on 08/07/2016 12/23/15   Curt Bears, MD    Family History Family History  Problem Relation Age of Onset  . Emphysema Mother   . Cancer Father     prostate  . Colon cancer Paternal Grandfather 32  . Cancer Paternal Grandfather     prostate  . Cancer Brother     prostate    Social History Social History  Substance Use Topics  . Smoking status: Former Smoker    Packs/day: 2.00    Years: 15.00    Types: Cigarettes    Quit date: 07/02/1968  . Smokeless tobacco: Former Systems developer    Types: Chew, Snuff  . Alcohol use No     Allergies   Codeine   Review of Systems Review of Systems   Physical Exam Updated Vital Signs BP 93/72   Pulse (!) 122   Resp 20   Ht '5\' 6"'$  (1.676 m)   Wt 130 lb (59 kg)   SpO2 100%   BMI 20.98 kg/m   Physical Exam 76 year old male, resting comfortably and in no acute distress. Vital signs are significant for tachycardia and hypotension. Oxygen saturation is 100%, which is normal. Head is normocephalic and atraumatic. PERRLA, EOMI. Oropharynx is clear. Neck is nontender and supple without adenopathy or JVD. Back is nontender and there is no CVA tenderness. Lungs have diminished breath sounds on the right with faint expiratory wheezes diffusely. Chest is nontender. Heart is tachycardic and irregular without murmur. Abdomen is soft, flat, nontender without masses or hepatosplenomegaly and peristalsis is normoactive. Extremities have no cyanosis or edema, full range of motion is present. Skin is warm and dry without rash. Neurologic: Mental status is normal, cranial nerves are  intact, there are no motor or sensory deficits.  ED Treatments / Results  Labs (all labs ordered are listed, but only abnormal results are displayed) Labs Reviewed  COMPREHENSIVE METABOLIC PANEL - Abnormal; Notable for the following:       Result Value   Potassium 3.4 (*)    Glucose, Bld 107 (*)    Calcium 8.6 (*)    Total Protein 6.0 (*)  Albumin 2.9 (*)    AST 14 (*)    ALT 10 (*)    Anion gap 4 (*)    All other components within normal limits  CBC WITH DIFFERENTIAL/PLATELET - Abnormal; Notable for the following:    RBC 3.76 (*)    Hemoglobin 10.8 (*)    HCT 33.1 (*)    RDW 19.6 (*)    All other components within normal limits  HEPARIN LEVEL (UNFRACTIONATED)  CBC  I-STAT TROPOININ, ED    EKG  EKG Interpretation  Date/Time:  Monday August 27 2016 15:24:08 EST Ventricular Rate:  153 PR Interval:    QRS Duration: 76 QT Interval:  281 QTC Calculation: 443 R Axis:   65 Text Interpretation:  Atrial fibrillation with rapid V-rate Borderline T wave abnormalities When compared with ECG of 02/20/2016, Atrial fibrillation with rapid ventricular response has replaced Sinus tachycardia Confirmed by Roxanne Mins  MD, Ramaya Guile ([id number]) on 08/27/2016 3:30:26 PM       Radiology Ct Chest W Contrast  Result Date: 08/27/2016 CLINICAL DATA:  Lung cancer diagnosed in 2016. Prostate cancer diagnosed in 2015. Chemotherapy in progress. Prior radiation therapy. EXAM: CT CHEST, ABDOMEN, AND PELVIS WITH CONTRAST TECHNIQUE: Multidetector CT imaging of the chest, abdomen and pelvis was performed following the standard protocol during bolus administration of intravenous contrast. CONTRAST:  1106m ISOVUE-300 IOPAMIDOL (ISOVUE-300) INJECTION 61% COMPARISON:  Multiple exams, including 06/27/2016 FINDINGS: CT CHEST FINDINGS Cardiovascular: Atherosclerotic calcification of the aortic arch. Mediastinum/Nodes: A single index rim enhancing subcarinal node measures 1.8 cm in short axis on image 30/2, formerly the  same by my measurements. A lymph node anterior to the left pulmonary artery measures 1.3 cm in short axis on image 27/2, previously 1.2 cm by my measurements. Lungs/Pleura: The dominant medial right upper lobe mass measures 12.2 by 5.9 cm on image 21/2, formerly 7.4 by 4.2 cm. There is a much larger and now extends along the medial border of the anterior mediastinum on the right. Extensive pleural metastatic disease on the right along with various nodules scattered in the right lung. An index nodule in the right middle lobe measures 2.5 by 2.0 cm on image 78/6, formerly 1.7 by 1.3 cm. Increased size of left-sided pulmonary nodules which are scattered. A nodule along the left hemidiaphragm measures 1.3 by 1.5 cm on image 123/6, formerly 0.9 by 1.1 cm. I do not see any new left-sided pulmonary nodules. Small bilateral pleural effusions are present ; the right pleural effusion is certainly malignant and the left pleural effusion is probably malignant. There is nodularity in the right pleural adipose tissue but without overt well-defined invasion of the chest wall. Musculoskeletal: Mild superior endplate concavities at the T2, T3, and T4 levels likely from subtle remote endplate compressions. Stable sclerosis along the anterior inferior endplate of T9. CT ABDOMEN PELVIS FINDINGS Hepatobiliary: Several peripheral areas of the liver demonstrate faintly accentuated enhancement on the early phase initial images, for example anteriorly in the dome of the liver on image 31 of series 4. This is most likely to be due to transient hepatic attenuation difference or vascular shunting, but strictly speaking early metastatic disease is not readily excluded. Pancreas: Unremarkable Spleen: Unremarkable Adrenals/Urinary Tract: Stable fullness of the left adrenal gland, fairly low density and measuring 1 cm in diameter, this was present and not very hypermetabolic on prior PET-CT and I favor this is being a small adenoma rather than  malignancy. Bilateral renal cysts are present. Small cellule or diverticulum of the right posterior  urinary bladder. Stomach/Bowel: Unremarkable Vascular/Lymphatic: Aortoiliac atherosclerotic vascular disease. No pathologic adenopathy in the abdomen or pelvis identified. Reproductive: Enlarged median lobe of the prostate indents the bladder base. Prostate gland measures 7.4 by 4.6 by 4.1 cm (volume = 73 cm^3). Other: Small but abnormal amount of free pelvic fluid. Cause uncertain. Low grade stranding in the mesentery. Musculoskeletal: Essentially stable appearance of mixed lytic and sclerotic metastatic lesion at the right anterior acetabulum extending into the right superior pubic ramus. Suspected stress fracture of the right inferior pubic ramus, likewise stable. IMPRESSION: 1. Enlarging dominant medial lung mass, currently measuring 12.2 by 5.9 cm (previously 7.4 by 4.2 cm). Enlarging bilateral pulmonary nodules. Considerable pleural metastatic disease on the right. Small bilateral pleural effusions. 2. Stable appearance of mixed density lytic and sclerotic metastatic lesion of the right anterior acetabulum and right superior pubic ramus. Stable stress fracture in the right inferior pubic ramus. 3. Sclerotic lesion along the anterior inferior endplate of T9 appears stable and is likely metastatic. 4. Essentially stable adenopathy in the chest. 5. Atherosclerosis. 6. The subtle superior endplate concavity at T2, T3, and T4, likely from remote mild endplate compressions. 7. There are several peripheral areas in the liver demonstrating faintly accentuated enhancement on the early phase initial images. These are probably from vascular shunting, but merit attention on follow up studies. 8. Enlarged prostate gland, measured at 73 cubic cm in volume. 9. Small but abnormal amount of free pelvic fluid. This is primarily anterior to the rectum. Electronically Signed   By: Van Clines M.D.   On: 08/27/2016 10:37    Ct Abdomen Pelvis W Contrast  Result Date: 08/27/2016 CLINICAL DATA:  Lung cancer diagnosed in 2016. Prostate cancer diagnosed in 2015. Chemotherapy in progress. Prior radiation therapy. EXAM: CT CHEST, ABDOMEN, AND PELVIS WITH CONTRAST TECHNIQUE: Multidetector CT imaging of the chest, abdomen and pelvis was performed following the standard protocol during bolus administration of intravenous contrast. CONTRAST:  126m ISOVUE-300 IOPAMIDOL (ISOVUE-300) INJECTION 61% COMPARISON:  Multiple exams, including 06/27/2016 FINDINGS: CT CHEST FINDINGS Cardiovascular: Atherosclerotic calcification of the aortic arch. Mediastinum/Nodes: A single index rim enhancing subcarinal node measures 1.8 cm in short axis on image 30/2, formerly the same by my measurements. A lymph node anterior to the left pulmonary artery measures 1.3 cm in short axis on image 27/2, previously 1.2 cm by my measurements. Lungs/Pleura: The dominant medial right upper lobe mass measures 12.2 by 5.9 cm on image 21/2, formerly 7.4 by 4.2 cm. There is a much larger and now extends along the medial border of the anterior mediastinum on the right. Extensive pleural metastatic disease on the right along with various nodules scattered in the right lung. An index nodule in the right middle lobe measures 2.5 by 2.0 cm on image 78/6, formerly 1.7 by 1.3 cm. Increased size of left-sided pulmonary nodules which are scattered. A nodule along the left hemidiaphragm measures 1.3 by 1.5 cm on image 123/6, formerly 0.9 by 1.1 cm. I do not see any new left-sided pulmonary nodules. Small bilateral pleural effusions are present ; the right pleural effusion is certainly malignant and the left pleural effusion is probably malignant. There is nodularity in the right pleural adipose tissue but without overt well-defined invasion of the chest wall. Musculoskeletal: Mild superior endplate concavities at the T2, T3, and T4 levels likely from subtle remote endplate compressions.  Stable sclerosis along the anterior inferior endplate of T9. CT ABDOMEN PELVIS FINDINGS Hepatobiliary: Several peripheral areas of the liver demonstrate faintly accentuated  enhancement on the early phase initial images, for example anteriorly in the dome of the liver on image 31 of series 4. This is most likely to be due to transient hepatic attenuation difference or vascular shunting, but strictly speaking early metastatic disease is not readily excluded. Pancreas: Unremarkable Spleen: Unremarkable Adrenals/Urinary Tract: Stable fullness of the left adrenal gland, fairly low density and measuring 1 cm in diameter, this was present and not very hypermetabolic on prior PET-CT and I favor this is being a small adenoma rather than malignancy. Bilateral renal cysts are present. Small cellule or diverticulum of the right posterior urinary bladder. Stomach/Bowel: Unremarkable Vascular/Lymphatic: Aortoiliac atherosclerotic vascular disease. No pathologic adenopathy in the abdomen or pelvis identified. Reproductive: Enlarged median lobe of the prostate indents the bladder base. Prostate gland measures 7.4 by 4.6 by 4.1 cm (volume = 73 cm^3). Other: Small but abnormal amount of free pelvic fluid. Cause uncertain. Low grade stranding in the mesentery. Musculoskeletal: Essentially stable appearance of mixed lytic and sclerotic metastatic lesion at the right anterior acetabulum extending into the right superior pubic ramus. Suspected stress fracture of the right inferior pubic ramus, likewise stable. IMPRESSION: 1. Enlarging dominant medial lung mass, currently measuring 12.2 by 5.9 cm (previously 7.4 by 4.2 cm). Enlarging bilateral pulmonary nodules. Considerable pleural metastatic disease on the right. Small bilateral pleural effusions. 2. Stable appearance of mixed density lytic and sclerotic metastatic lesion of the right anterior acetabulum and right superior pubic ramus. Stable stress fracture in the right inferior pubic  ramus. 3. Sclerotic lesion along the anterior inferior endplate of T9 appears stable and is likely metastatic. 4. Essentially stable adenopathy in the chest. 5. Atherosclerosis. 6. The subtle superior endplate concavity at T2, T3, and T4, likely from remote mild endplate compressions. 7. There are several peripheral areas in the liver demonstrating faintly accentuated enhancement on the early phase initial images. These are probably from vascular shunting, but merit attention on follow up studies. 8. Enlarged prostate gland, measured at 73 cubic cm in volume. 9. Small but abnormal amount of free pelvic fluid. This is primarily anterior to the rectum. Electronically Signed   By: Van Clines M.D.   On: 08/27/2016 10:37    Procedures Procedures (including critical care time) CRITICAL CARE Performed by: DUKGU,RKYHC Total critical care time: 60 minutes Critical care time was exclusive of separately billable procedures and treating other patients. Critical care was necessary to treat or prevent imminent or life-threatening deterioration. Critical care was time spent personally by me on the following activities: development of treatment plan with patient and/or surrogate as well as nursing, discussions with consultants, evaluation of patient's response to treatment, examination of patient, obtaining history from patient or surrogate, ordering and performing treatments and interventions, ordering and review of laboratory studies, ordering and review of radiographic studies, pulse oximetry and re-evaluation of patient's condition.  Medications Ordered in ED Medications  apixaban (ELIQUIS) tablet 5 mg (not administered)  metoprolol (LOPRESSOR) injection 2.5 mg (2.5 mg Intravenous Given 08/27/16 1559)  sodium chloride 0.9 % bolus 1,000 mL (0 mLs Intravenous Stopped 08/27/16 1655)  heparin bolus via infusion 3,000 Units (3,000 Units Intravenous Bolus from Bag 08/27/16 1839)     Initial Impression /  Assessment and Plan / ED Course  I have reviewed the triage vital signs and the nursing notes.  Pertinent labs & imaging results that were available during my care of the patient were reviewed by me and considered in my medical decision making (see chart for details).  Atrial fibrillation with rapid ventricular response. While I was in the room with him, he did momentarily convert to sinus rhythm, but then went back into atrial fibrillation. Blood pressure was as low as 64 systolic in the doctor's office. Here blood pressure is in the 90s, but his normal blood pressure is over 725 systolic. He does appear somewhat dehydrated so will be given IV fluids. Rhythm has been present for 4 days, so immediate cardioversion is contraindicated. He is started on heparin for anticoagulation. He is given metoprolol for rate control because as blood pressures too low to tolerate calcium channel blocker. He will likely need admission until it is clear that his rhythm and blood pressure adequately controlled. Review of old records confirms chemotherapy for lung cancer. CT scan of chest, abdomen, pelvis obtained today did show progression of disease. Patient was advised of this finding.  Following IV metoprolol, her rate did come down, and he converted to sinus rhythm. He remained in sinus rhythm for an extended period. Blood pressure remained adequate. He was ambulated in the ED and did not get dizzy all, although oxygen saturation did drop to 85%. This is not surprising, given his known large right-sided lung cancer. He was felt to be stable to go home at this point. Heparin was discontinued, and he is started on apixaban. He is referred to cardiology for follow-up.  CHA2DS2/VAS Stroke Risk Points      3 >= 2 Points: High Risk  1 - 1.99 Points: Medium Risk  0 Points: Low Risk    The patient's score has not changed in the past year.:  No Change         Details    Note: External data might be a factor in metrics not  marked with    Points Metrics   This score determines the patient's risk of having a stroke if the  patient has atrial fibrillation.       0 Has Congestive Heart Failure:  No   0 Has Vascular Disease:  No   1 Has Hypertension:  Yes   2 Age:  53   0 Has Diabetes:  No   0 Had Stroke:  No Had TIA:  No Had thromboembolism:  No   0 Male:  No          Final Clinical Impressions(s) / ED Diagnoses   Final diagnoses:  Paroxysmal atrial fibrillation with rapid ventricular response (HCC)  Primary malignant neoplasm of right lung metastatic to other site [facility])  Normochromic normocytic anemia    New Prescriptions New Prescriptions   APIXABAN (ELIQUIS) 5 MG TABS TABLET    Take 1 tablet (5 mg total) by mouth 2 (two) times daily.   METOPROLOL SUCCINATE (TOPROL-XL) 25 MG 24 HR TABLET    Take 1 tablet (25 mg total) by mouth daily.     Delora Fuel, MD [id number]

## 2016-08-27 NOTE — ED Notes (Signed)
Pt dropped to 85% O2.  No distress noted

## 2016-08-27 NOTE — Patient Instructions (Addendum)
Stop taking [name] and Flomax. Both of these medications can cause your blood pressure to drop. We will recheck your blood pressure tomorrow when you come back to see Dr. [name].    Dehydration, Adult Dehydration is a condition in which there is not enough fluid or water in the body. This happens when you lose more fluids than you take in. Important organs, such as the kidneys, brain, and heart, cannot function without a proper amount of fluids. Any loss of fluids from the body can lead to dehydration. Dehydration can range from mild to severe. This condition should be treated right away to prevent it from becoming severe. What are the causes? This condition may be caused by:  Vomiting.  Diarrhea.  Excessive sweating, such as from heat exposure or exercise.  Not drinking enough fluid, especially:  When ill.  While doing activity that requires a lot of energy.  Excessive urination.  Fever.  Infection.  Certain medicines, such as medicines that cause the body to lose excess fluid (diuretics).  Inability to access safe drinking water.  Reduced physical ability to get adequate water and food. What increases the risk? This condition is more likely to develop in people:  Who have a poorly controlled long-term (chronic) illness, such as diabetes, heart disease, or kidney disease.  Who are age 9 or older.  Who are disabled.  Who live in a place with high altitude.  Who play endurance sports. What are the signs or symptoms? Symptoms of mild dehydration may include:  Thirst.  Dry lips.  Slightly dry mouth.  Dry, warm skin.  Dizziness. Symptoms of moderate dehydration may include:  Very dry mouth.  Muscle cramps.  Dark urine. Urine may be the color of tea.  Decreased urine production.  Decreased tear production.  Heartbeat that is irregular or faster than normal (palpitations).  Headache.  Light-headedness, especially when you stand up from a sitting  position.  Fainting (syncope). Symptoms of severe dehydration may include:  Changes in skin, such as:  Cold and clammy skin.  Blotchy (mottled) or pale skin.  Skin that does not quickly return to normal after being lightly pinched and released (poor skin turgor).  Changes in body fluids, such as:  Extreme thirst.  No tear production.  Inability to sweat when body temperature is high, such as in hot weather.  Very little urine production.  Changes in vital signs, such as:  Weak pulse.  Pulse that is more than 100 beats a minute when sitting still.  Rapid breathing.  Low blood pressure.  Other changes, such as:  Sunken eyes.  Cold hands and feet.  Confusion.  Lack of energy (lethargy).  Difficulty waking up from sleep.  Short-term weight loss.  Unconsciousness. How is this diagnosed? This condition is diagnosed based on your symptoms and a physical exam. Blood and urine tests may be done to help confirm the diagnosis. How is this treated? Treatment for this condition depends on the severity. Mild or moderate dehydration can often be treated at home. Treatment should be started right away. Do not wait until dehydration becomes severe. Severe dehydration is an emergency and it needs to be treated in a hospital. Treatment for mild dehydration may include:  Drinking more fluids.  Replacing salts and minerals in your blood (electrolytes) that you may have lost. Treatment for moderate dehydration may include:  Drinking an oral rehydration solution (ORS). This is a drink that helps you replace fluids and electrolytes (rehydrate). It can be found at  pharmacies and retail stores. Treatment for severe dehydration may include:  Receiving fluids through an IV tube.  Receiving an electrolyte solution through a feeding tube that is passed through your nose and into your stomach (nasogastric tube, or NG tube).  Correcting any abnormalities in electrolytes.  Treating  the underlying cause of dehydration. Follow these instructions at home:  If directed by your health care provider, drink an ORS:  Make an ORS by following instructions on the package.  Start by drinking small amounts, about  cup (120 mL) every 5-10 minutes.  Slowly increase how much you drink until you have taken the amount recommended by your health care provider.  Drink enough clear fluid to keep your urine clear or pale yellow. If you were told to drink an ORS, finish the ORS first, then start slowly drinking other clear fluids. Drink fluids such as:  Water. Do not drink only water. Doing that can lead to having too little salt (sodium) in the body (hyponatremia).  Ice chips.  Fruit juice that you have added water to (diluted fruit juice).  Low-calorie sports drinks.  Avoid:  Alcohol.  Drinks that contain a lot of sugar. These include high-calorie sports drinks, fruit juice that is not diluted, and soda.  Caffeine.  Foods that are greasy or contain a lot of fat or sugar.  Take over-the-counter and prescription medicines only as told by your health care provider.  Do not take sodium tablets. This can lead to having too much sodium in the body (hypernatremia).  Eat foods that contain a healthy balance of electrolytes, such as bananas, oranges, potatoes, tomatoes, and spinach.  Keep all follow-up visits as told by your health care provider. This is important. Contact a health care provider if:  You have abdominal pain that:  Gets worse.  Stays in one area (localizes).  You have a rash.  You have a stiff neck.  You are more irritable than usual.  You are sleepier or more difficult to wake up than usual.  You feel weak or dizzy.  You feel very thirsty.  You have urinated only a small amount of very dark urine over 6-8 hours. Get help right away if:  You have symptoms of severe dehydration.  You cannot drink fluids without vomiting.  Your symptoms get  worse with treatment.  You have a fever.  You have a severe headache.  You have vomiting or diarrhea that:  Gets worse.  Does not go away.  You have blood or green matter (bile) in your vomit.  You have blood in your stool. This may cause stool to look black and tarry.  You have not urinated in 6-8 hours.  You faint.  Your heart rate while sitting still is over 100 beats a minute.  You have trouble breathing. This information is not intended to replace advice given to you by your health care provider. Make sure you discuss any questions you have with your health care provider. Document Released: 06/18/2005 Document Revised: 01/13/2016 Document Reviewed: 08/12/2015 Elsevier Interactive Patient Education  2017 [employer].

## 2016-08-27 NOTE — ED Notes (Signed)
Bed: RESB Expected date:  Expected time:  Means of arrival:  Comments: Pt from [facility]

## 2016-08-27 NOTE — Progress Notes (Signed)
Ambulated to bathroom with assist. Became increasingly wheezy on return walk.Mikey Bussing, NP aware and ordered nebulized albuterol for him. This was given.  Pt states his breathing is essentially unchanged after treatment. Still with audible wheezing. His liter of normal saline is completed. VS checked and he remains orthostatic. Also noted that pt's heart rate now ranging from 36-124. While standing for BP check, noted pt to start to sway a bit and was slow to respond to his name. This lasted only for a few seconds, but pt's wife noticed this too. Pt states he feels 'terrible', "just not right on the inside".   Mikey Bussing, NP made aware.  Decision made to do EKG. EKG reveals Afib. No previous h/o AFib. Arrangements made for pt to go to EDcfor evaluation. Report called to Catalina Antigua, Warehouse manager.    IV saline locked.  Transported to ED,via w/c with pt's wife in attendance.

## 2016-08-27 NOTE — Discharge Instructions (Signed)
Return if you are having any problems.  _______________________________ Information on my medicine - ELIQUIS (apixaban)  This medication education was reviewed with me or my healthcare representative as part of my discharge preparation.    Why was Eliquis prescribed for you? Eliquis was prescribed for you to reduce the risk of a blood clot forming that can cause a stroke if you have a medical condition called atrial fibrillation (a type of irregular heartbeat).  What do You need to know about Eliquis ? Take your Eliquis TWICE DAILY - one tablet in the morning and one tablet in the evening with or without food. If you have difficulty swallowing the tablet whole please discuss with your pharmacist how to take the medication safely.  Take Eliquis exactly as prescribed by your doctor and DO NOT stop taking Eliquis without talking to the doctor who prescribed the medication.  Stopping may increase your risk of developing a stroke.  Refill your prescription before you run out.  After discharge, you should have regular check-up appointments with your healthcare provider that is prescribing your Eliquis.  In the future your dose may need to be changed if your kidney function or weight changes by a significant amount or as you get older.  What do you do if you miss a dose? If you miss a dose, take it as soon as you remember on the same day and resume taking twice daily.  Do not take more than one dose of ELIQUIS at the same time to make up a missed dose.  Important Safety Information A possible side effect of Eliquis is bleeding. You should call your healthcare provider right away if you experience any of the following: ? Bleeding from an injury or your nose that does not stop. ? Unusual colored urine (red or dark brown) or unusual colored stools (red or black). ? Unusual bruising for unknown reasons. ? A serious fall or if you hit your head (even if there is no bleeding).  Some medicines  may interact with Eliquis and might increase your risk of bleeding or clotting while on Eliquis. To help avoid this, consult your healthcare provider or pharmacist prior to using any new prescription or non-prescription medications, including herbals, vitamins, non-steroidal anti-inflammatory drugs (NSAIDs) and supplements.  This website has more information on Eliquis (apixaban): http://www.eliquis.com/eliquis/home

## 2016-08-27 NOTE — ED Triage Notes (Signed)
Pt brought from cancer center due to new onset of afib. Pt complained of dizzness and was orthostatic at cancer center. Pt given 1000cc NS by cancer center.

## 2016-08-27 NOTE — ED Notes (Signed)
Bed: VF47 Expected date:  Expected time:  Means of arrival:  Comments: INTUBATING RES A - HOLD

## 2016-08-27 NOTE — Progress Notes (Signed)
ANTICOAGULATION CONSULT NOTE - Initial Consult  Pharmacy Consult for heparin Indication: new onset atrial fibrillation  Allergies  Allergen Reactions  . Codeine Other (See Comments)    : hallucinations,faint,dizziness    Patient Measurements: Height: '5\' 6"'$  (167.6 cm) Weight: 130 lb (59 kg) IBW/kg (Calculated) : 63.8 Heparin Dosing Weight: 59 kg  Vital Signs: Temp: 98.1 F (36.7 C) (02/26 1113) Temp Source: Oral (02/26 1113) BP: 92/66 (02/26 1536) Pulse Rate: 122 (02/26 1535)  Labs:  Recent Labs  08/27/16 1019 08/27/16 1020 08/27/16 1550  HGB 11.8*  --  10.8*  HCT 36.5*  --  33.1*  PLT 320  --  367  CREATININE  --  1.0  --     Estimated Creatinine Clearance: 52.4 mL/min (by C-G formula based on SCr of 1 mg/dL).   Medical History: Past Medical History:  Diagnosis Date  . 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 Anxiety state, unspecified   . Benign hypertrophy of prostate   . Benign neoplasm of colon    colon polyps  . Cancer [facility])    prostate cancer  . Chronic fatigue 12/23/2015  . Constipation, chronic   . Cough 06/27/2016  . Dehydration 02/22/2016  . Diverticulosis of colon (without mention of hemorrhage)   . Dysuria 03/07/2016  . Encounter for antineoplastic chemotherapy 09/29/2015  . Encounter for antineoplastic immunotherapy 12/23/2015  . Erectile dysfunction   . GERD (gastroesophageal reflux disease)   . Glaucoma   . Hypercholesteremia   . Hypothyroidism   . Irritable bowel syndrome   . Left hip pain 10/27/2015  . Malignant pleural effusion    right  . Onychomycosis   . Radiation 06/20/15-08/02/15   right upper lung region 60 gray  . Restless legs syndrome (RLS)   . Shortness of breath at rest 06/27/2016  . Sleep apnea    does not use cpap  . Unspecified essential hypertension     Assessment: Patient's a 76 y.o M with metastatic NSCLC currently undergoing chemotherapy treatment, presented to the ED from the [facility] for  management of new onset afib.  To start heparin for afib.  Goal of Therapy:  Heparin level 0.3-0.7 units/ml Monitor platelets by anticoagulation protocol: Yes   Plan:  - heparin 3000 units IV x1 bolus, then drip at 900 units/hr - check 8 hr heparin level - monitor for s/s bleeding  Arjay Jaskiewicz P 08/27/2016,4:09 PM

## 2016-08-28 ENCOUNTER — Encounter: Payer: Self-pay | Admitting: Internal Medicine

## 2016-08-28 ENCOUNTER — Ambulatory Visit (HOSPITAL_BASED_OUTPATIENT_CLINIC_OR_DEPARTMENT_OTHER): Payer: Medicare HMO | Admitting: Internal Medicine

## 2016-08-28 ENCOUNTER — Ambulatory Visit: Payer: Medicare HMO

## 2016-08-28 VITALS — BP 112/71 | HR 108 | Temp 97.9°F | Resp 17 | Ht 66.0 in | Wt 132.6 lb

## 2016-08-28 DIAGNOSIS — C7951 Secondary malignant neoplasm of bone: Secondary | ICD-10-CM

## 2016-08-28 DIAGNOSIS — C3411 Malignant neoplasm of upper lobe, right bronchus or lung: Secondary | ICD-10-CM | POA: Diagnosis not present

## 2016-08-28 DIAGNOSIS — R5382 Chronic fatigue, unspecified: Secondary | ICD-10-CM | POA: Diagnosis not present

## 2016-08-28 DIAGNOSIS — I4891 Unspecified atrial fibrillation: Secondary | ICD-10-CM | POA: Diagnosis not present

## 2016-08-28 DIAGNOSIS — J449 Chronic obstructive pulmonary disease, unspecified: Secondary | ICD-10-CM | POA: Diagnosis not present

## 2016-08-28 DIAGNOSIS — Z5111 Encounter for antineoplastic chemotherapy: Secondary | ICD-10-CM

## 2016-08-28 NOTE — Progress Notes (Signed)
Pt referred to Hospice -called to [facility]

## 2016-08-28 NOTE — Progress Notes (Signed)
[facility] [phone]   [phone]  OFFICE PROGRESS NOTE  [name], MD [place] [address]  DIAGNOSIS: Metastatic non-small cell lung cancer, adenocarcinoma initially diagnosed as a stage IIIA (T1b, N2, M0) presented with right upper lobe lung nodule in addition to mediastinal lymphadenopathy diagnosed in November 2016.  MOLECULAR STUDIES (Foundation one):  Genomic Alterations Identified? KRAS G12C STK11 Q220*  PRIOR THERAPY: 1) Concurrent chemoradiation with weekly carboplatin for AUC of 2 and paclitaxel 45 MG/M2. Status post 5 cycles. Last dose was given 07/18/2015 with partial response. 2) Consolidation chemotherapy with carboplatin for AUC of 5 and Alimta 500 MG/M2 every 3 weeks. First dose 10/06/2015. Status post 3 cycles. 3) Immunotherapy with Nivolumab 240 MG IV every 2 weeks. First dose 12/28/2015. Status post 4 cycles. Last dose was giving 02/08/2016 discontinued secondary to disease progression.  CURRENT THERAPY: Systemic chemotherapy with gemcitabine 1000 MG/M2 on days 1 and 8 every 3 weeks status post 4 cycles.  INTERVAL HISTORY: Alan [name] 76 y.o. male came to the clinic today for follow-up visit accompanied by his wife and daughter. The patient has been complaining of increasing fatigue and weakness as well as shortness of breath and persistent cough. He was seen at the emergency department yesterday for atrial fibrillation. His condition stabilized and he was discharged home. He was advised to see a cardiologist for evaluation of his condition. He has been on treatment with single agent gemcitabine status post 4 cycles and tolerated the treatment well except for fatigue and weakness. He also has few episodes of nausea and vomiting. He denied having any current chest pain but has shortness of breath and persistent cough with no hemoptysis. He has no fever or chills. He lost few pounds since his last visit. The  patient had repeat CT scan of the chest, abdomen and pelvis performed recently and he is here for evaluation and discussion of his scar results and recommendation regarding treatment of his condition.   MEDICAL HISTORY: Past Medical History:  Diagnosis Date  . 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 Anxiety state, unspecified   . Benign hypertrophy of prostate   . Benign neoplasm of colon    colon polyps  . Cancer [facility])    prostate cancer  . Chronic fatigue 12/23/2015  . Constipation, chronic   . Cough 06/27/2016  . Dehydration 02/22/2016  . Diverticulosis of colon (without mention of hemorrhage)   . Dysuria 03/07/2016  . Encounter for antineoplastic chemotherapy 09/29/2015  . Encounter for antineoplastic immunotherapy 12/23/2015  . Erectile dysfunction   . GERD (gastroesophageal reflux disease)   . Glaucoma   . Hypercholesteremia   . Hypothyroidism   . Irritable bowel syndrome   . Left hip pain 10/27/2015  . Malignant pleural effusion    right  . Onychomycosis   . Radiation 06/20/15-08/02/15   right upper lung region 60 gray  . Restless legs syndrome (RLS)   . Shortness of breath at rest 06/27/2016  . Sleep apnea    does not use cpap  . Unspecified essential hypertension     ALLERGIES:  is allergic to codeine.  MEDICATIONS:  Current Outpatient Prescriptions  Medication Sig Dispense Refill  . acetaminophen (TYLENOL) 500 MG tablet Take 1,000 mg by mouth every 4 (four) hours as needed for mild pain, moderate pain, fever or headache.     . albuterol (PROVENTIL HFA;VENTOLIN HFA) 108 (90 Base) MCG/ACT inhaler Inhale 2 puffs into the  lungs every 6 (six) hours as needed for wheezing or shortness of breath. 1 Inhaler 0  . apixaban (ELIQUIS) 5 MG TABS tablet Take 1 tablet (5 mg total) by mouth 2 (two) times daily. 60 tablet 0  . aspirin EC 81 MG tablet Take 81 mg by mouth daily.     . brimonidine-timolol (COMBIGAN) 0.2-0.5 % ophthalmic solution Place 1 drop into both  eyes every 12 (twelve) hours.    . chlorpheniramine-HYDROcodone (TUSSIONEX) 10-8 MG/5ML SUER Take 5 mLs by mouth every 12 (twelve) hours as needed for cough. 140 mL 0  . chlorproMAZINE (THORAZINE) 25 MG tablet Take 25 mg by mouth 3 (three) times daily as needed (for hiccups).    . dronabinol (MARINOL) 2.5 MG capsule Take 1 capsule (2.5 mg total) by mouth 2 (two) times daily before a meal. for appetite 60 capsule 0  . finasteride ([name]) 5 MG tablet Take 1 tablet (5 mg total) by mouth daily. 30 tablet 2  . fluticasone (FLONASE) 50 MCG/ACT nasal spray Place 2 sprays into both nostrils daily as needed for allergies or rhinitis. 48 g 3  . lactulose (CHRONULAC) 10 GM/15ML solution Take 10 g by mouth 2 (two) times daily as needed for mild constipation or moderate constipation.     [name] levothyroxine (SYNTHROID, LEVOTHROID) 125 MCG tablet Take 1 tablet (125 mcg total) by mouth daily. 30 tablet 0  . LINZESS 290 MCG CAPS capsule TAKE ONE CAPSULE BY MOUTH ONCE DAILY 30 capsule 3  . metoprolol succinate (TOPROL-XL) 25 MG 24 hr tablet Take 1 tablet (25 mg total) by mouth daily. 30 tablet 0  . mirtazapine (REMERON) 30 MG tablet Take 1 tablet (30 mg total) by mouth at bedtime. (Patient taking differently: Take 30 mg by mouth at bedtime as needed (for sleep). ) 30 tablet 0  . omeprazole (PRILOSEC) 20 MG capsule Take 1 capsule (20 mg total) by mouth every morning. 90 capsule 3  . oxyCODONE-acetaminophen (PERCOCET/ROXICET) 5-325 MG tablet Take 1 tablet by mouth every 4 (four) hours as needed for severe pain. 30 tablet 0  . potassium chloride SA (K-DUR,KLOR-CON) 20 MEQ tablet Take 1 tablet (20 mEq total) by mouth 2 (two) times daily. 7 tablet 0  . predniSONE (DELTASONE) 10 MG tablet Take 1 tablet (10 mg total) by mouth daily. 30 tablet 0  . prochlorperazine (COMPAZINE) 10 MG tablet TAKE 1 TABLET BY MOUTH EVERY 6 HOURS AS NEEDED FOR NAUSEA AND VOMITING (Patient taking differently: Take 10 mg by mouth every 6 (six) hours  as needed for nausea or vomiting. ) 60 tablet 0  . senna-docusate (SENOKOT-S) 8.6-50 MG tablet Take 2 tablets by mouth 2 (two) times daily. 30 tablet 0  . simvastatin (ZOCOR) 20 MG tablet TAKE 1 TABLET BY MOUTH AT BEDTIME 90 tablet 0  . sucralfate (CARAFATE) 1 g tablet Take 1 tablet (1 g total) by mouth 4 (four) times daily -  with meals and at bedtime. Mix in 2Tbs water, swish and swallow 60 tablet 0  . tamsulosin (FLOMAX) 0.4 MG CAPS capsule Take 2 capsules (0.8 mg total) by mouth at bedtime. 90 capsule 3   No current facility-administered medications for this visit.     SURGICAL HISTORY:  Past Surgical History:  Procedure Laterality Date  . APPENDECTOMY  12/2012  . CARDIAC CATHETERIZATION  ? 2008  . CHEST TUBE INSERTION Right 03/01/2016   Procedure: INSERTION PLEURAL DRAINAGE CATHETER with doxycycline pleurodesis;  Surgeon: Gaye Pollack, MD;  Location: [facility];  Service:  Thoracic;  Laterality: Right;  . COLONOSCOPY    . LAPAROSCOPIC APPENDECTOMY N/A 01/27/2013   Procedure: APPENDECTOMY LAPAROSCOPIC;  Surgeon: Edward Jolly, MD;  Location: [facility];  Service: General;  Laterality: N/A;  . MEDIASTINOSCOPY N/A 05/31/2015   Procedure: MEDIASTINOSCOPY;  Surgeon: Gaye Pollack, MD;  Location: [facility];  Service: Thoracic;  Laterality: N/A;  . none    . REMOVAL OF PLEURAL DRAINAGE CATHETER Right 04/04/2016   Procedure: REMOVAL OF PLEURAL DRAINAGE CATHETER;  Surgeon: Gaye Pollack, MD;  Location: [facility];  Service: Thoracic;  Laterality: Right;  . THYROIDECTOMY    . TONSILLECTOMY    . VIDEO BRONCHOSCOPY N/A 05/31/2015   Procedure: VIDEO BRONCHOSCOPY;  Surgeon: Gaye Pollack, MD;  Location: [facility];  Service: Thoracic;  Laterality: N/A;    REVIEW OF SYSTEMS:  Constitutional: positive for anorexia, fatigue and weight loss Eyes: negative Ears, nose, mouth, throat, and face: negative Respiratory: positive for cough, dyspnea on exertion, sputum and wheezing Cardiovascular: positive for  tachypnea Gastrointestinal: negative Genitourinary:negative Integument/breast: negative Hematologic/lymphatic: negative Musculoskeletal:positive for bone pain and muscle weakness Neurological: negative Behavioral/Psych: negative Endocrine: negative Allergic/Immunologic: negative   PHYSICAL EXAMINATION: General appearance: alert, cooperative, fatigued and no distress Head: Normocephalic, without obvious abnormality, atraumatic Neck: no adenopathy, no JVD, supple, symmetrical, trachea midline and thyroid not enlarged, symmetric, no tenderness/mass/nodules Lymph nodes: Cervical, supraclavicular, and axillary nodes normal. Resp: wheezes bilaterally Back: symmetric, no curvature. ROM normal. No CVA tenderness. Cardio: regular rate and rhythm, S1, S2 normal, no murmur, click, rub or gallop GI: soft, non-tender; bowel sounds normal; no masses,  no organomegaly Extremities: extremities normal, atraumatic, no cyanosis or edema Neurologic: Alert and oriented X 3, normal strength and tone. Normal symmetric reflexes. Normal coordination and gait  ECOG PERFORMANCE STATUS: 1 - Symptomatic but completely ambulatory  Blood pressure 112/71, pulse (!) 108, temperature 97.9 F (36.6 C), temperature source Oral, resp. rate 17, height '5\' 6"'  (1.676 m), weight 132 lb 9.6 oz (60.1 kg), SpO2 97 %.  LABORATORY DATA: Lab Results  Component Value Date   WBC 4.9 08/27/2016   HGB 10.8 (L) 08/27/2016   HCT 33.1 (L) 08/27/2016   MCV 88.0 08/27/2016   PLT 367 08/27/2016      Chemistry      Component Value Date/Time   NA 138 08/27/2016 1550   NA 139 08/27/2016 1020   K 3.4 (L) 08/27/2016 1550   K 4.1 08/27/2016 1020   CL 105 08/27/2016 1550   CO2 29 08/27/2016 1550   CO2 27 08/27/2016 1020   BUN 15 08/27/2016 1550   BUN 15.2 08/27/2016 1020   CREATININE 1.02 08/27/2016 1550   CREATININE 1.0 08/27/2016 1020      Component Value Date/Time   CALCIUM 8.6 (L) 08/27/2016 1550   CALCIUM 9.8 08/27/2016  1020   ALKPHOS 52 08/27/2016 1550   ALKPHOS 67 08/27/2016 1020   AST 14 (L) 08/27/2016 1550   AST 9 08/27/2016 1020   ALT 10 (L) 08/27/2016 1550   ALT 7 08/27/2016 1020   BILITOT 0.6 08/27/2016 1550   BILITOT 0.52 08/27/2016 1020       RADIOGRAPHIC STUDIES: Ct Chest W Contrast  Result Date: 08/27/2016 CLINICAL DATA:  Lung cancer diagnosed in 2016. Prostate cancer diagnosed in 2015. Chemotherapy in progress. Prior radiation therapy. EXAM: CT CHEST, ABDOMEN, AND PELVIS WITH CONTRAST TECHNIQUE: Multidetector CT imaging of the chest, abdomen and pelvis was performed following the standard protocol during bolus administration of intravenous contrast. CONTRAST:  133m ISOVUE-300 IOPAMIDOL (ISOVUE-300) INJECTION 61% COMPARISON:  Multiple exams, including 06/27/2016 FINDINGS: CT CHEST FINDINGS Cardiovascular: Atherosclerotic calcification of the aortic arch. Mediastinum/Nodes: A single index rim enhancing subcarinal node measures 1.8 cm in short axis on image 30/2, formerly the same by my measurements. A lymph node anterior to the left pulmonary artery measures 1.3 cm in short axis on image 27/2, previously 1.2 cm by my measurements. Lungs/Pleura: The dominant medial right upper lobe mass measures 12.2 by 5.9 cm on image 21/2, formerly 7.4 by 4.2 cm. There is a much larger and now extends along the medial border of the anterior mediastinum on the right. Extensive pleural metastatic disease on the right along with various nodules scattered in the right lung. An index nodule in the right middle lobe measures 2.5 by 2.0 cm on image 78/6, formerly 1.7 by 1.3 cm. Increased size of left-sided pulmonary nodules which are scattered. A nodule along the left hemidiaphragm measures 1.3 by 1.5 cm on image 123/6, formerly 0.9 by 1.1 cm. I do not see any new left-sided pulmonary nodules. Small bilateral pleural effusions are present ; the right pleural effusion is certainly malignant and the left pleural effusion is  probably malignant. There is nodularity in the right pleural adipose tissue but without overt well-defined invasion of the chest wall. Musculoskeletal: Mild superior endplate concavities at the T2, T3, and T4 levels likely from subtle remote endplate compressions. Stable sclerosis along the anterior inferior endplate of T9. CT ABDOMEN PELVIS FINDINGS Hepatobiliary: Several peripheral areas of the liver demonstrate faintly accentuated enhancement on the early phase initial images, for example anteriorly in the dome of the liver on image 31 of series 4. This is most likely to be due to transient hepatic attenuation difference or vascular shunting, but strictly speaking early metastatic disease is not readily excluded. Pancreas: Unremarkable Spleen: Unremarkable Adrenals/Urinary Tract: Stable fullness of the left adrenal gland, fairly low density and measuring 1 cm in diameter, this was present and not very hypermetabolic on prior PET-CT and I favor this is being a small adenoma rather than malignancy. Bilateral renal cysts are present. Small cellule or diverticulum of the right posterior urinary bladder. Stomach/Bowel: Unremarkable Vascular/Lymphatic: Aortoiliac atherosclerotic vascular disease. No pathologic adenopathy in the abdomen or pelvis identified. Reproductive: Enlarged median lobe of the prostate indents the bladder base. Prostate gland measures 7.4 by 4.6 by 4.1 cm (volume = 73 cm^3). Other: Small but abnormal amount of free pelvic fluid. Cause uncertain. Low grade stranding in the mesentery. Musculoskeletal: Essentially stable appearance of mixed lytic and sclerotic metastatic lesion at the right anterior acetabulum extending into the right superior pubic ramus. Suspected stress fracture of the right inferior pubic ramus, likewise stable. IMPRESSION: 1. Enlarging dominant medial lung mass, currently measuring 12.2 by 5.9 cm (previously 7.4 by 4.2 cm). Enlarging bilateral pulmonary nodules. Considerable  pleural metastatic disease on the right. Small bilateral pleural effusions. 2. Stable appearance of mixed density lytic and sclerotic metastatic lesion of the right anterior acetabulum and right superior pubic ramus. Stable stress fracture in the right inferior pubic ramus. 3. Sclerotic lesion along the anterior inferior endplate of T9 appears stable and is likely metastatic. 4. Essentially stable adenopathy in the chest. 5. Atherosclerosis. 6. The subtle superior endplate concavity at T2, T3, and T4, likely from remote mild endplate compressions. 7. There are several peripheral areas in the liver demonstrating faintly accentuated enhancement on the early phase initial images. These are probably from vascular shunting, but merit attention on follow up  studies. 8. Enlarged prostate gland, measured at 73 cubic cm in volume. 9. Small but abnormal amount of free pelvic fluid. This is primarily anterior to the rectum. Electronically Signed   By: Van Clines M.D.   On: 08/27/2016 10:37   Ct Abdomen Pelvis W Contrast  Result Date: 08/27/2016 CLINICAL DATA:  Lung cancer diagnosed in 2016. Prostate cancer diagnosed in 2015. Chemotherapy in progress. Prior radiation therapy. EXAM: CT CHEST, ABDOMEN, AND PELVIS WITH CONTRAST TECHNIQUE: Multidetector CT imaging of the chest, abdomen and pelvis was performed following the standard protocol during bolus administration of intravenous contrast. CONTRAST:  134m ISOVUE-300 IOPAMIDOL (ISOVUE-300) INJECTION 61% COMPARISON:  Multiple exams, including 06/27/2016 FINDINGS: CT CHEST FINDINGS Cardiovascular: Atherosclerotic calcification of the aortic arch. Mediastinum/Nodes: A single index rim enhancing subcarinal node measures 1.8 cm in short axis on image 30/2, formerly the same by my measurements. A lymph node anterior to the left pulmonary artery measures 1.3 cm in short axis on image 27/2, previously 1.2 cm by my measurements. Lungs/Pleura: The dominant medial right upper  lobe mass measures 12.2 by 5.9 cm on image 21/2, formerly 7.4 by 4.2 cm. There is a much larger and now extends along the medial border of the anterior mediastinum on the right. Extensive pleural metastatic disease on the right along with various nodules scattered in the right lung. An index nodule in the right middle lobe measures 2.5 by 2.0 cm on image 78/6, formerly 1.7 by 1.3 cm. Increased size of left-sided pulmonary nodules which are scattered. A nodule along the left hemidiaphragm measures 1.3 by 1.5 cm on image 123/6, formerly 0.9 by 1.1 cm. I do not see any new left-sided pulmonary nodules. Small bilateral pleural effusions are present ; the right pleural effusion is certainly malignant and the left pleural effusion is probably malignant. There is nodularity in the right pleural adipose tissue but without overt well-defined invasion of the chest wall. Musculoskeletal: Mild superior endplate concavities at the T2, T3, and T4 levels likely from subtle remote endplate compressions. Stable sclerosis along the anterior inferior endplate of T9. CT ABDOMEN PELVIS FINDINGS Hepatobiliary: Several peripheral areas of the liver demonstrate faintly accentuated enhancement on the early phase initial images, for example anteriorly in the dome of the liver on image 31 of series 4. This is most likely to be due to transient hepatic attenuation difference or vascular shunting, but strictly speaking early metastatic disease is not readily excluded. Pancreas: Unremarkable Spleen: Unremarkable Adrenals/Urinary Tract: Stable fullness of the left adrenal gland, fairly low density and measuring 1 cm in diameter, this was present and not very hypermetabolic on prior PET-CT and I favor this is being a small adenoma rather than malignancy. Bilateral renal cysts are present. Small cellule or diverticulum of the right posterior urinary bladder. Stomach/Bowel: Unremarkable Vascular/Lymphatic: Aortoiliac atherosclerotic vascular  disease. No pathologic adenopathy in the abdomen or pelvis identified. Reproductive: Enlarged median lobe of the prostate indents the bladder base. Prostate gland measures 7.4 by 4.6 by 4.1 cm (volume = 73 cm^3). Other: Small but abnormal amount of free pelvic fluid. Cause uncertain. Low grade stranding in the mesentery. Musculoskeletal: Essentially stable appearance of mixed lytic and sclerotic metastatic lesion at the right anterior acetabulum extending into the right superior pubic ramus. Suspected stress fracture of the right inferior pubic ramus, likewise stable. IMPRESSION: 1. Enlarging dominant medial lung mass, currently measuring 12.2 by 5.9 cm (previously 7.4 by 4.2 cm). Enlarging bilateral pulmonary nodules. Considerable pleural metastatic disease on the right. Small bilateral pleural  effusions. 2. Stable appearance of mixed density lytic and sclerotic metastatic lesion of the right anterior acetabulum and right superior pubic ramus. Stable stress fracture in the right inferior pubic ramus. 3. Sclerotic lesion along the anterior inferior endplate of T9 appears stable and is likely metastatic. 4. Essentially stable adenopathy in the chest. 5. Atherosclerosis. 6. The subtle superior endplate concavity at T2, T3, and T4, likely from remote mild endplate compressions. 7. There are several peripheral areas in the liver demonstrating faintly accentuated enhancement on the early phase initial images. These are probably from vascular shunting, but merit attention on follow up studies. 8. Enlarged prostate gland, measured at 73 cubic cm in volume. 9. Small but abnormal amount of free pelvic fluid. This is primarily anterior to the rectum. Electronically Signed   By: Van Clines M.D.   On: 08/27/2016 10:37    ASSESSMENT AND PLAN:  This is a very pleasant 76 years old African-American male with metastatic non-small cell lung cancer, adenocarcinoma diagnosed in November 2016. He is status post several  chemotherapy regimens as well as radiation. Most recently the patient was treated with single agent gemcitabine status post 4 cycles. He had repeat CT scan of the chest, abdomen and pelvis performed recently. I personally and independently reviewed the scan images and discuss the results and showed the images to the patient and his family. Unfortunately scan showed evidence for disease progression. I had a lengthy discussion with the patient and his family about his condition and I strongly recommended for him to consider palliative care and hospice at this point. The patient and his family are in agreement with the current plan. For the atrial fibrillation, he will see his primary care physician and get referral to cardiology for evaluation of his condition. For pain management, the patient will continue on his current pain medication with Percocet. For COPD, and we'll start the patient on Medrol Dosepak and he will continue his current inhalers. He was advised to call immediately if he has any concerning symptoms in the interval. The patient voices understanding of current disease status and treatment options and is in agreement with the current care plan.  All questions were answered. The patient knows to call the clinic with any problems, questions or concerns. We can certainly see the patient much sooner if necessary.  Disclaimer: This note was dictated with voice recognition software. Similar sounding words can inadvertently be transcribed and may not be corrected upon review.

## 2016-08-30 ENCOUNTER — Ambulatory Visit (HOSPITAL_COMMUNITY)
Admission: RE | Admit: 2016-08-30 | Discharge: 2016-08-30 | Disposition: A | Discharge status: Home / Self Care | Source: Ambulatory Visit | Attending: Nurse Practitioner | Admitting: Nurse Practitioner

## 2016-08-30 ENCOUNTER — Encounter (HOSPITAL_COMMUNITY): Payer: Self-pay | Admitting: Nurse Practitioner

## 2016-08-30 ENCOUNTER — Other Ambulatory Visit: Payer: Self-pay | Admitting: Internal Medicine

## 2016-08-30 VITALS — BP 104/76 | HR 111 | Ht 66.0 in | Wt 128.4 lb

## 2016-08-30 DIAGNOSIS — I4891 Unspecified atrial fibrillation: Secondary | ICD-10-CM | POA: Diagnosis present

## 2016-08-30 DIAGNOSIS — Z9889 Other specified postprocedural states: Secondary | ICD-10-CM | POA: Diagnosis not present

## 2016-08-30 DIAGNOSIS — Z87891 Personal history of nicotine dependence: Secondary | ICD-10-CM | POA: Diagnosis not present

## 2016-08-30 DIAGNOSIS — Z8546 Personal history of malignant neoplasm of prostate: Secondary | ICD-10-CM | POA: Insufficient documentation

## 2016-08-30 DIAGNOSIS — C3490 Malignant neoplasm of unspecified part of unspecified bronchus or lung: Secondary | ICD-10-CM | POA: Insufficient documentation

## 2016-08-30 DIAGNOSIS — Z79899 Other long term (current) drug therapy: Secondary | ICD-10-CM | POA: Diagnosis not present

## 2016-08-30 DIAGNOSIS — I480 Paroxysmal atrial fibrillation: Secondary | ICD-10-CM | POA: Diagnosis not present

## 2016-08-30 DIAGNOSIS — Z8042 Family history of malignant neoplasm of prostate: Secondary | ICD-10-CM | POA: Insufficient documentation

## 2016-08-30 DIAGNOSIS — Z923 Personal history of irradiation: Secondary | ICD-10-CM | POA: Insufficient documentation

## 2016-08-30 DIAGNOSIS — Z7982 Long term (current) use of aspirin: Secondary | ICD-10-CM | POA: Insufficient documentation

## 2016-08-30 DIAGNOSIS — Z9221 Personal history of antineoplastic chemotherapy: Secondary | ICD-10-CM | POA: Diagnosis not present

## 2016-08-31 ENCOUNTER — Telehealth: Payer: Self-pay | Admitting: Internal Medicine

## 2016-08-31 NOTE — Telephone Encounter (Signed)
Patient needs to cancel all appointments set.  Hospice has been called in and he no longer needs treatment.  Patient will call back if there are any changes

## 2016-08-31 NOTE — Progress Notes (Signed)
Primary Care Physician: [name], MD Referring Physician: [facility] ER   Alan [name] is a 76 y.o. male with a h/o  Metastatic non-small cell lung cancer, adenocarcinoma initially diagnosed as a stage IIIA (T1b, N2, M0) presented with right upper lobe lung nodule in addition to mediastinal lymphadenopathy diagnosed in November 2016. He was seen in the ER with newly diagnosed Afib which he did spontaneously convert to SR.  Pt has been told recently by his oncology MD that his lung cancer is very aggressive despite therapy and there is nothing else to offer, he has limited time left and he has been referred to palliative care and hospice. He is in the afib clinic today for discussion of new onset afib. He has rx for apixaban and metoprolol but has not started. After discussion today, pt states he has very limited time left and is not interested in preventing a stroke or taking daily meds that will make him feel more fatigued. He states that if a stroke took him out of this world it would be a blessing. He has lost 2 more lbs in two days.  Today, he denies symptoms of palpitations, chest pain, shortness of breath, orthopnea, PND, lower extremity edema, dizziness, presyncope, syncope, or neurologic sequela. The patient is tolerating medications without difficulties and is otherwise without complaint today.   Past Medical History:  Diagnosis Date  . 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 Anxiety state, unspecified   . Benign hypertrophy of prostate   . Benign neoplasm of colon    colon polyps  . Cancer [facility])    prostate cancer  . Chronic fatigue 12/23/2015  . Constipation, chronic   . Cough 06/27/2016  . Dehydration 02/22/2016  . Diverticulosis of colon (without mention of hemorrhage)   . Dysuria 03/07/2016  . Encounter for antineoplastic chemotherapy 09/29/2015  . Encounter for antineoplastic immunotherapy 12/23/2015  . Erectile dysfunction   . GERD (gastroesophageal reflux  disease)   . Glaucoma   . Hypercholesteremia   . Hypothyroidism   . Irritable bowel syndrome   . Left hip pain 10/27/2015  . Malignant pleural effusion    right  . Onychomycosis   . Radiation 06/20/15-08/02/15   right upper lung region 60 gray  . Restless legs syndrome (RLS)   . Shortness of breath at rest 06/27/2016  . Sleep apnea    does not use cpap  . Unspecified essential hypertension    Past Surgical History:  Procedure Laterality Date  . APPENDECTOMY  12/2012  . CARDIAC CATHETERIZATION  ? 2008  . CHEST TUBE INSERTION Right 03/01/2016   Procedure: INSERTION PLEURAL DRAINAGE CATHETER with doxycycline pleurodesis;  Surgeon: Gaye Pollack, MD;  Location: [facility] OR;  Service: Thoracic;  Laterality: Right;  . COLONOSCOPY    . LAPAROSCOPIC APPENDECTOMY N/A 01/27/2013   Procedure: APPENDECTOMY LAPAROSCOPIC;  Surgeon: Edward Jolly, MD;  Location: [facility];  Service: General;  Laterality: N/A;  . MEDIASTINOSCOPY N/A 05/31/2015   Procedure: MEDIASTINOSCOPY;  Surgeon: Gaye Pollack, MD;  Location: [place];  Service: Thoracic;  Laterality: N/A;  . none    . REMOVAL OF PLEURAL DRAINAGE CATHETER Right 04/04/2016   Procedure: REMOVAL OF PLEURAL DRAINAGE CATHETER;  Surgeon: Gaye Pollack, MD;  Location: [place];  Service: Thoracic;  Laterality: Right;  . THYROIDECTOMY    . TONSILLECTOMY    . VIDEO BRONCHOSCOPY N/A 05/31/2015   Procedure: VIDEO BRONCHOSCOPY;  Surgeon: Gaye Pollack, MD;  Location: [place];  Service:  Thoracic;  Laterality: N/A;    Current Outpatient Prescriptions  Medication Sig Dispense Refill  . acetaminophen (TYLENOL) 500 MG tablet Take 1,000 mg by mouth every 4 (four) hours as needed for mild pain, moderate pain, fever or headache.     . albuterol (PROVENTIL HFA;VENTOLIN HFA) 108 (90 Base) MCG/ACT inhaler Inhale 2 puffs into the lungs every 6 (six) hours as needed for wheezing or shortness of breath. 1 Inhaler 0  . aspirin EC 81 MG tablet Take 81 mg by mouth daily.     .  brimonidine-timolol (COMBIGAN) 0.2-0.5 % ophthalmic solution Place 1 drop into both eyes every 12 (twelve) hours.    . chlorpheniramine-HYDROcodone (TUSSIONEX) 10-8 MG/5ML SUER Take 5 mLs by mouth every 12 (twelve) hours as needed for cough. 140 mL 0  . chlorproMAZINE (THORAZINE) 25 MG tablet Take 25 mg by mouth 3 (three) times daily as needed (for hiccups).    . finasteride ([name]) 5 MG tablet Take 1 tablet (5 mg total) by mouth daily. 30 tablet 2  . fluticasone (FLONASE) 50 MCG/ACT nasal spray Place 2 sprays into both nostrils daily as needed for allergies or rhinitis. 48 g 3  . lactulose (CHRONULAC) 10 GM/15ML solution Take 10 g by mouth 2 (two) times daily as needed for mild constipation or moderate constipation.     [name] LINZESS 290 MCG CAPS capsule TAKE ONE CAPSULE BY MOUTH ONCE DAILY 30 capsule 3  . omeprazole (PRILOSEC) 20 MG capsule Take 1 capsule (20 mg total) by mouth every morning. 90 capsule 3  . oxyCODONE-acetaminophen (PERCOCET/ROXICET) 5-325 MG tablet Take 1 tablet by mouth every 4 (four) hours as needed for severe pain. 30 tablet 0  . predniSONE (DELTASONE) 10 MG tablet Take 1 tablet (10 mg total) by mouth daily. 30 tablet 0  . prochlorperazine (COMPAZINE) 10 MG tablet TAKE 1 TABLET BY MOUTH EVERY 6 HOURS AS NEEDED FOR NAUSEA AND VOMITING (Patient taking differently: Take 10 mg by mouth every 6 (six) hours as needed for nausea or vomiting. ) 60 tablet 0  . senna-docusate (SENOKOT-S) 8.6-50 MG tablet Take 2 tablets by mouth 2 (two) times daily. 30 tablet 0  . sucralfate (CARAFATE) 1 g tablet Take 1 tablet (1 g total) by mouth 4 (four) times daily -  with meals and at bedtime. Mix in 2Tbs water, swish and swallow 60 tablet 0  . tamsulosin (FLOMAX) 0.4 MG CAPS capsule Take 2 capsules (0.8 mg total) by mouth at bedtime. 90 capsule 3  . apixaban (ELIQUIS) 5 MG TABS tablet Take 1 tablet (5 mg total) by mouth 2 (two) times daily. (Patient not taking: Reported on 08/30/2016) 60 tablet 0  .  dronabinol (MARINOL) 2.5 MG capsule Take 1 capsule (2.5 mg total) by mouth 2 (two) times daily before a meal. for appetite (Patient not taking: Reported on 08/30/2016) 60 capsule 0  . levothyroxine (SYNTHROID, LEVOTHROID) 125 MCG tablet TAKE 1 TABLET(125 MCG) BY MOUTH DAILY 30 tablet 0  . metoprolol succinate (TOPROL-XL) 25 MG 24 hr tablet Take 1 tablet (25 mg total) by mouth daily. (Patient not taking: Reported on 08/30/2016) 30 tablet 0  . mirtazapine (REMERON) 30 MG tablet Take 1 tablet (30 mg total) by mouth at bedtime. (Patient not taking: Reported on 08/30/2016) 30 tablet 0  . potassium chloride SA (K-DUR,KLOR-CON) 20 MEQ tablet Take 1 tablet (20 mEq total) by mouth 2 (two) times daily. (Patient not taking: Reported on 08/30/2016) 7 tablet 0   No current facility-administered medications for this  encounter.     Allergies  Allergen Reactions  . Codeine Other (See Comments)    Reaction:  Hallucinations and dizziness     Social History   Social History  . Marital status: Married    Spouse name: N/A  . Number of children: N/A  . Years of education: N/A   Occupational History  . retired    Social History Main Topics  . Smoking status: Former Smoker    Packs/day: 2.00    Years: 15.00    Types: Cigarettes    Quit date: 07/02/1968  . Smokeless tobacco: Former Systems developer    Types: Chew, Snuff  . Alcohol use No  . Drug use: No  . Sexual activity: Not Currently   Other Topics Concern  . Not on file   Social History Narrative  . No narrative on file    Family History  Problem Relation Age of Onset  . Emphysema Mother   . Cancer Father     prostate  . Colon cancer Paternal Grandfather 38  . Cancer Paternal Grandfather     prostate  . Cancer Brother     prostate    ROS- All systems are reviewed and negative except as per the HPI above  Physical Exam: Vitals:   08/30/16 0901  BP: 104/76  Pulse: (!) 111  Weight: 128 lb 6.4 oz (58.2 kg)  Height: '5\' 6"'$  (1.676 m)   Wt Readings  from Last 3 Encounters:  08/30/16 128 lb 6.4 oz (58.2 kg)  08/28/16 132 lb 9.6 oz (60.1 kg)  08/27/16 130 lb (59 kg)    Labs: Lab Results  Component Value Date   NA 138 08/27/2016   K 3.4 (L) 08/27/2016   CL 105 08/27/2016   CO2 29 08/27/2016   GLUCOSE 107 (H) 08/27/2016   BUN 15 08/27/2016   CREATININE 1.02 08/27/2016   CALCIUM 8.6 (L) 08/27/2016   Lab Results  Component Value Date   INR 1.17 03/01/2016   Lab Results  Component Value Date   CHOL 162 04/21/2015   HDL 54.80 04/21/2015   LDLCALC 94 04/21/2015   TRIG 66.0 04/21/2015     GEN- The patient is well appearing, alert and oriented x 3 today.   Head- normocephalic, atraumatic Eyes-  Sclera clear, conjunctiva pink Ears- hearing intact Oropharynx- clear Neck- supple, no JVP Lymph- no cervical lymphadenopathy Lungs- Clear to ausculation bilaterally, wheezing with talking. Breath sounds diminished bilaterally. Heart- Regular rate and rhythm, no murmurs, rubs or gallops, PMI not laterally displaced GI- soft, NT, ND, + BS Extremities- no clubbing, cyanosis, or edema MS- no significant deformity or atrophy Skin- no rash or lesion Psych- euthymic mood, full affect Neuro- strength and sensation are intact  EKG-  Sinus tach 111 bpm, Pr int 138 ms, qrs int 82 ms, qtc 454 ms Epic records reviewed    Assessment and Plan: 1. Paroxsymal Afib in the setting of terminal lung cancer Pt is adament that he does not want any blood thinner at this time He is aware of a possible stroke but would welcome that if it meant a quick death He does not want a daily drug if it means if he would feel more fatigued He will take metoprolol as needed  for episodes of afib  He was encouraged to call the afib clinic as needed for advise  Afib clinc as needed  Butch Penny C. Tayt Moyers, [facility] [address] [place], [place] [address] [phone]

## 2016-09-04 ENCOUNTER — Ambulatory Visit

## 2016-09-04 ENCOUNTER — Other Ambulatory Visit

## 2016-09-06 ENCOUNTER — Telehealth: Payer: Self-pay | Admitting: *Deleted

## 2016-09-06 DIAGNOSIS — R062 Wheezing: Secondary | ICD-10-CM

## 2016-09-06 MED ORDER — ALBUTEROL SULFATE (2.5 MG/3ML) 0.083% IN NEBU: 2.5000 mg | INHALATION_SOLUTION | Freq: Four times a day (QID) | RESPIRATORY_TRACT | 12 refills | Status: AC | PRN

## 2016-09-06 NOTE — Telephone Encounter (Signed)
"  This is Journalist, newspaper [phone]) with Hospice calling for [name].  He has a lot of wheezing on the right side and very short of breath.  He has the albuterol inhaler and does not use it very much.  On oxygen at 2L.  Oxygen saturation = 97 %.  resp rate = 16 no swelling or fluid problems today.  What orders/advice does Dr. [name] have."

## 2016-09-06 NOTE — Telephone Encounter (Signed)
Pt has nebulizer machine -albuterol ordered for nebulizer per [facility].

## 2016-09-14 ENCOUNTER — Encounter: Payer: Self-pay | Admitting: Internal Medicine

## 2016-09-14 ENCOUNTER — Ambulatory Visit (INDEPENDENT_AMBULATORY_CARE_PROVIDER_SITE_OTHER): Admitting: Internal Medicine

## 2016-09-14 VITALS — BP 102/62 | HR 80 | Wt 132.0 lb

## 2016-09-14 DIAGNOSIS — E890 Postprocedural hypothyroidism: Secondary | ICD-10-CM

## 2016-09-14 MED ORDER — LEVOTHYROXINE SODIUM 125 MCG PO TABS: ORAL_TABLET | ORAL | 3 refills | Status: DC

## 2016-09-14 NOTE — Patient Instructions (Signed)
Continue Levothyroxine 125 mcg daily for now.  Take the thyroid hormone every day, with water, >30 minutes before breakfast, separated by >4 hours from acid reflux medications, calcium, iron, multivitamins.  Please come back for a follow-up appointment in 1 year.

## 2016-09-14 NOTE — Progress Notes (Signed)
Patient ID: Alan [name], male   DOB: 1941/01/29, 76 y.o.   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 Last visit 1 year ago.  Pt was in the ED with A fib with RVR  08/27/2016. On Eliquis now.  A TSH was checked then >> normal.  Pt was dx'ed with metastatic non small cell lung cancer before last visit. He had a CXR on 04/21/2015 - RUL nodule. He had ChTx and RxTx.   He is on Oxygen - started 10/2015. He has a chronic cough.  Reviewed hx: He was found to have a low TSH (normal fT4) during regular labs.  An uptake and scan (04/20/2014) showed: 1. Warm nodule in the right lobe of thyroid gland. 2.  24 hour I 131 uptake = 22.2% (normal 10-30%)  He had RAI tx 06/01/2014.  He developed hypothyroidism after his radioactive iodine treatment. Now on LT4 125 mcg daily, increased since last visit.  He is taking the levothyroxine: - Daily - With water - Separated by more than 30 minutes from breakfast - No calcium, iron - no multivitamins - + PPIs at night  I reviewed pt's thyroid tests: Lab Results  Component Value Date   TSH 3.205 08/27/2016   TSH 4.876 (H) 07/24/2016   TSH 0.212 (L) 06/06/2016   TSH 5.859 (H) 03/21/2016   TSH 20.212 (H) 02/22/2016   TSH 0.757 01/11/2016   TSH 6.68 (H) 09/15/2015   TSH 2.29 04/21/2015   TSH 14.10 (H) 03/04/2015   TSH 36.89 (H) 12/28/2014   FREET4 1.03 09/15/2015   FREET4 0.99 03/04/2015   FREET4 0.56 (L) 12/28/2014   FREET4 0.30 (L) 11/15/2014   FREET4 0.34 (L) 07/28/2014   FREET4 1.21 05/14/2014   FREET4 1.09 03/19/2014   FREET4 0.90 04/24/2012    Pt denies feeling nodules in neck, hoarseness, dysphagia/odynophagia, SOB with lying down; he c/o: - + fatigue - + weight loss and gain - no excessive sweating/heat intolerance, + cold intolerance - no tremors - no anxiety - no palpitations - no  hyperdefecation  He also has prostate cancer - seen by Urology.  ROS: Constitutional: see HPI, + nocturia Eyes: no blurry vision, no xerophthalmia ENT: no sore throat, no nodules palpated in throat, no dysphagia/odynophagia, no hoarseness Cardiovascular:+ CP/+ SOB/+ palpitations/no leg swelling Respiratory:+ cough/+ SOB/+ wheezing Gastrointestinal: + N/no V/D/+ C Musculoskeletal: + muscle aches/+ joint aches Skin: no rashes Neurological: no tremors/numbness/tingling/dizziness, + HA  I reviewed pt's medications, allergies, PMH, social hx, family hx, and changes were documented in the history of present illness. Otherwise, unchanged from my initial visit note.  Past Medical History:  Diagnosis Date  . 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 Anxiety state, unspecified   . Benign hypertrophy of prostate   . Benign neoplasm of colon    colon polyps  . Cancer [facility])    prostate cancer  . Chronic fatigue 12/23/2015  . Constipation, chronic   . Cough 06/27/2016  . Dehydration 02/22/2016  . Diverticulosis of colon (without mention of hemorrhage)   . Dysuria 03/07/2016  . Encounter for antineoplastic chemotherapy 09/29/2015  . Encounter for antineoplastic immunotherapy 12/23/2015  . Erectile dysfunction   . GERD (gastroesophageal reflux disease)   . Glaucoma   . Hypercholesteremia   . Hypothyroidism   . Irritable bowel syndrome   . Left hip pain 10/27/2015  . Malignant  pleural effusion    right  . Onychomycosis   . Radiation 06/20/15-08/02/15   right upper lung region 60 gray  . Restless legs syndrome (RLS)   . Shortness of breath at rest 06/27/2016  . Sleep apnea    does not use cpap  . Unspecified essential hypertension    Past Surgical History:  Procedure Laterality Date  . APPENDECTOMY  12/2012  . CARDIAC CATHETERIZATION  ? 2008  . CHEST TUBE INSERTION Right 03/01/2016   Procedure: INSERTION PLEURAL DRAINAGE CATHETER with doxycycline pleurodesis;  Surgeon: Gaye Pollack, MD;  Location: [facility] OR;  Service: Thoracic;  Laterality: Right;  . COLONOSCOPY    . LAPAROSCOPIC APPENDECTOMY N/A 01/27/2013   Procedure: APPENDECTOMY LAPAROSCOPIC;  Surgeon: Edward Jolly, MD;  Location: [facility];  Service: General;  Laterality: N/A;  . MEDIASTINOSCOPY N/A 05/31/2015   Procedure: MEDIASTINOSCOPY;  Surgeon: Gaye Pollack, MD;  Location: [place];  Service: Thoracic;  Laterality: N/A;  . none    . REMOVAL OF PLEURAL DRAINAGE CATHETER Right 04/04/2016   Procedure: REMOVAL OF PLEURAL DRAINAGE CATHETER;  Surgeon: Gaye Pollack, MD;  Location: [place];  Service: Thoracic;  Laterality: Right;  . THYROIDECTOMY    . TONSILLECTOMY    . VIDEO BRONCHOSCOPY N/A 05/31/2015   Procedure: VIDEO BRONCHOSCOPY;  Surgeon: Gaye Pollack, MD;  Location: [facility] OR;  Service: Thoracic;  Laterality: N/A;   History   Social History  . Marital Status: Married    Spouse Name: N/A    Children: yes   Occupational History  . Retired, Company secretary    Social History Main Topics  . Smoking status: Former Smoker -- 2.00 packs/day for 15 years    Types: Cigarettes    Quit date: 07/02/1968  . Smokeless tobacco: Never Used  . Alcohol Use: No  . Drug Use: No   Current Outpatient Prescriptions on File Prior to Visit  Medication Sig Dispense Refill  . acetaminophen (TYLENOL) 500 MG tablet Take 1,000 mg by mouth every 4 (four) hours as needed for mild pain, moderate pain, fever or headache.     . albuterol (PROVENTIL HFA;VENTOLIN HFA) 108 (90 Base) MCG/ACT inhaler Inhale 2 puffs into the lungs every 6 (six) hours as needed for wheezing or shortness of breath. 1 Inhaler 0  . albuterol (PROVENTIL) (2.5 MG/3ML) 0.083% nebulizer solution Take 3 mLs (2.5 mg total) by nebulization every 6 (six) hours as needed for wheezing or shortness of breath. 75 mL 12  . apixaban (ELIQUIS) 5 MG TABS tablet Take 1 tablet (5 mg total) by mouth 2 (two) times daily. (Patient not taking: Reported on 08/30/2016) 60 tablet 0  .  aspirin EC 81 MG tablet Take 81 mg by mouth daily.     . brimonidine-timolol (COMBIGAN) 0.2-0.5 % ophthalmic solution Place 1 drop into both eyes every 12 (twelve) hours.    . chlorpheniramine-HYDROcodone (TUSSIONEX) 10-8 MG/5ML SUER Take 5 mLs by mouth every 12 (twelve) hours as needed for cough. 140 mL 0  . chlorproMAZINE (THORAZINE) 25 MG tablet Take 25 mg by mouth 3 (three) times daily as needed (for hiccups).    . dronabinol (MARINOL) 2.5 MG capsule Take 1 capsule (2.5 mg total) by mouth 2 (two) times daily before a meal. for appetite (Patient not taking: Reported on 08/30/2016) 60 capsule 0  . finasteride ([name]) 5 MG tablet Take 1 tablet (5 mg total) by mouth daily. 30 tablet 2  . fluticasone (FLONASE) 50 MCG/ACT nasal spray Place 2 sprays  into both nostrils daily as needed for allergies or rhinitis. 48 g 3  . lactulose (CHRONULAC) 10 GM/15ML solution Take 10 g by mouth 2 (two) times daily as needed for mild constipation or moderate constipation.     [name] levothyroxine (SYNTHROID, LEVOTHROID) 125 MCG tablet TAKE 1 TABLET(125 MCG) BY MOUTH DAILY 30 tablet 0  . LINZESS 290 MCG CAPS capsule TAKE ONE CAPSULE BY MOUTH ONCE DAILY 30 capsule 3  . metoprolol succinate (TOPROL-XL) 25 MG 24 hr tablet Take 1 tablet (25 mg total) by mouth daily. (Patient not taking: Reported on 08/30/2016) 30 tablet 0  . mirtazapine (REMERON) 30 MG tablet Take 1 tablet (30 mg total) by mouth at bedtime. (Patient not taking: Reported on 08/30/2016) 30 tablet 0  . omeprazole (PRILOSEC) 20 MG capsule Take 1 capsule (20 mg total) by mouth every morning. 90 capsule 3  . oxyCODONE-acetaminophen (PERCOCET/ROXICET) 5-325 MG tablet Take 1 tablet by mouth every 4 (four) hours as needed for severe pain. 30 tablet 0  . potassium chloride SA (K-DUR,KLOR-CON) 20 MEQ tablet Take 1 tablet (20 mEq total) by mouth 2 (two) times daily. (Patient not taking: Reported on 08/30/2016) 7 tablet 0  . predniSONE (DELTASONE) 10 MG tablet Take 1 tablet (10 mg  total) by mouth daily. 30 tablet 0  . prochlorperazine (COMPAZINE) 10 MG tablet TAKE 1 TABLET BY MOUTH EVERY 6 HOURS AS NEEDED FOR NAUSEA AND VOMITING (Patient taking differently: Take 10 mg by mouth every 6 (six) hours as needed for nausea or vomiting. ) 60 tablet 0  . senna-docusate (SENOKOT-S) 8.6-50 MG tablet Take 2 tablets by mouth 2 (two) times daily. 30 tablet 0  . sucralfate (CARAFATE) 1 g tablet Take 1 tablet (1 g total) by mouth 4 (four) times daily -  with meals and at bedtime. Mix in 2Tbs water, swish and swallow 60 tablet 0  . tamsulosin (FLOMAX) 0.4 MG CAPS capsule Take 2 capsules (0.8 mg total) by mouth at bedtime. 90 capsule 3   No current facility-administered medications on file prior to visit.    Allergies  Allergen Reactions  . Codeine Other (See Comments)    Reaction:  Hallucinations and dizziness    Family History  Problem Relation Age of Onset  . Emphysema Mother   . Cancer Father     prostate  . Colon cancer Paternal Grandfather 28  . Cancer Paternal Grandfather     prostate  . Cancer Brother     prostate   PE: BP 102/62 (BP Location: Left Arm, Patient Position: Sitting)   Pulse 80   Wt 132 lb (59.9 kg)   SpO2 90% Comment: 2L  BMI 21.31 kg/m  Body mass index is 21.31 kg/m. Wt Readings from Last 3 Encounters:  09/14/16 132 lb (59.9 kg)  08/30/16 128 lb 6.4 oz (58.2 kg)  08/28/16 132 lb 9.6 oz (60.1 kg)   Constitutional: thin, in NAD, on O2 Eyes: PERRLA, EOMI, no exophthalmos ENT: moist mucous membranes, no thyromegaly, no cervical lymphadenopathy Cardiovascular: irreg. irreg., RR, No MRG Respiratory: CTA B Gastrointestinal: abdomen soft, NT, ND, BS+ Musculoskeletal: no deformities, strength intact in all 4 Skin: moist, warm, no rashes Neurological: + mild tremor with outstretched hands, DTR normal in all 4  ASSESSMENT: 1. 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 Post-ablative hypothyroidism  PLAN:  1.  and 2. Patient with  previous subclinical hyperthyroidism caused by a R toxic adenoma, now s/p ablation in 06/2014, with subsequent hypothyroidism. He had  fluctuating TFTs before >> last TSH checked <1 mo ago was normal on LT4 125 mcg daily. - He appears euthyroid. Will continue current dose. - Will check TFTs today: TSH, free T4 at next visit, in 1 year - I advised him to take the thyroid hormone every day, with water, at least 30 minutes before breakfast, separated by at least 4 hours from: - acid reflux medications - calcium - iron - multivitamins He is taking it correctly. - RTC in 1 year  Needs refills when labs are back.  Philemon Kingdom, MD PhD [facility] Endocrinology

## 2016-09-17 NOTE — Congregational Nurse Program (Signed)
Congregational Nurse Program Note  Date of Encounter: 09/16/2016  Past Medical History: Past Medical History:  Diagnosis Date  . 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 Anxiety state, unspecified   . Benign hypertrophy of prostate   . Benign neoplasm of colon    colon polyps  . Cancer [facility])    prostate cancer  . Chronic fatigue 12/23/2015  . Constipation, chronic   . Cough 06/27/2016  . Dehydration 02/22/2016  . Diverticulosis of colon (without mention of hemorrhage)   . Dysuria 03/07/2016  . Encounter for antineoplastic chemotherapy 09/29/2015  . Encounter for antineoplastic immunotherapy 12/23/2015  . Erectile dysfunction   . GERD (gastroesophageal reflux disease)   . Glaucoma   . Hypercholesteremia   . Hypothyroidism   . Irritable bowel syndrome   . Left hip pain 10/27/2015  . Malignant pleural effusion    right  . Onychomycosis   . Radiation 06/20/15-08/02/15   right upper lung region 60 gray  . Restless legs syndrome (RLS)   . Shortness of breath at rest 06/27/2016  . Sleep apnea    does not use cpap  . Unspecified essential hypertension     Encounter Details:     CNP Questionnaire - 09/16/16 2305      Patient Demographics   Is this a new or existing patient? Existing   Patient is considered a/an Not Applicable   Race African-American/Black     Patient Assistance   Location of Patient Assistance [facility]   Patient's financial/insurance status Low Income;Medicare   Uninsured Patient ([facility]) No   Patient referred to apply for the following financial assistance Not Applicable   Food insecurities addressed Not Applicable   Transportation assistance No   Assistance securing medications No   Educational health offerings Cancer;Chronic disease     Encounter Details   Primary purpose of visit Spiritual Care/Support Visit   Was an Emergency Department visit averted? No   Does patient have a medical provider? Yes   Patient referred to Not Applicable   Was a mental health screening completed? (GAINS tool) No   Does patient have dental issues? No   Does patient have vision issues? Yes   Was a vision referral made? No   Does your patient have an abnormal blood pressure today? No   Since previous encounter, have you referred patient for abnormal blood pressure that resulted in a new diagnosis or medication change? No   Does your patient have an abnormal blood glucose today? No   Since previous encounter, have you referred patient for abnormal blood glucose that resulted in a new diagnosis or medication change? No   Was there a life-saving intervention made? No    Client seen at [facility].  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  Reported he was feeling bad this week.  He was short of breath and coughing this morning.  Client continues to be prayed for by his congregation.

## 2016-09-18 ENCOUNTER — Other Ambulatory Visit

## 2016-09-18 ENCOUNTER — Ambulatory Visit

## 2016-09-18 ENCOUNTER — Ambulatory Visit: Admitting: Internal Medicine

## 2016-09-25 ENCOUNTER — Other Ambulatory Visit

## 2016-09-25 ENCOUNTER — Ambulatory Visit

## 2016-10-05 ENCOUNTER — Other Ambulatory Visit: Payer: Self-pay | Admitting: Oncology

## 2016-10-09 ENCOUNTER — Ambulatory Visit: Admitting: Internal Medicine

## 2016-10-09 ENCOUNTER — Other Ambulatory Visit

## 2016-10-09 ENCOUNTER — Ambulatory Visit

## 2016-10-16 ENCOUNTER — Ambulatory Visit

## 2016-10-16 ENCOUNTER — Other Ambulatory Visit

## 2016-10-30 ENCOUNTER — Telehealth: Payer: Self-pay

## 2016-10-30 NOTE — Telephone Encounter (Signed)
Alan [name] left FMLA papers about 1.5 weeks ago. Her father is transitioning/ getting worse. She needs the FMLA papers faxed to her job. The fax [phone] attn [name].  LVM with [name]

## 2016-10-31 ENCOUNTER — Telehealth: Payer: Self-pay | Admitting: Internal Medicine

## 2016-10-31 NOTE — Telephone Encounter (Signed)
Completed FMLA paperwork on 10/30/16 for patient's daughter Isamar Wellbrock and faxed to [facility] [name] [phone] and scanned copy into Epic. Spoke to [employer] today on 10/31/16 and advised that FMLA forms were turned into to me with no patient name and no contact phone number to call her as there was no FMLA cover sheet turned in with the forms. I apologized for the inconvenience and offered to make copies of the forms and place them at the [facility] reception desk for pickup which I did.

## 2016-11-05 ENCOUNTER — Other Ambulatory Visit: Payer: Self-pay | Admitting: Internal Medicine

## 2016-11-07 ENCOUNTER — Encounter (HOSPITAL_COMMUNITY): Payer: Self-pay

## 2016-11-07 ENCOUNTER — Emergency Department (HOSPITAL_COMMUNITY)
Admission: EM | Admit: 2016-11-07 | Discharge: 2016-11-07 | Disposition: A | Discharge status: Home / Self Care | Attending: Physician Assistant | Admitting: Physician Assistant

## 2016-11-07 DIAGNOSIS — R0602 Shortness of breath: Secondary | ICD-10-CM | POA: Diagnosis present

## 2016-11-07 DIAGNOSIS — Z87891 Personal history of nicotine dependence: Secondary | ICD-10-CM | POA: Insufficient documentation

## 2016-11-07 DIAGNOSIS — Z7982 Long term (current) use of aspirin: Secondary | ICD-10-CM | POA: Insufficient documentation

## 2016-11-07 DIAGNOSIS — Z7901 Long term (current) use of anticoagulants: Secondary | ICD-10-CM | POA: Diagnosis not present

## 2016-11-07 DIAGNOSIS — Z8546 Personal history of malignant neoplasm of prostate: Secondary | ICD-10-CM | POA: Diagnosis not present

## 2016-11-07 DIAGNOSIS — Z85038 Personal history of other malignant neoplasm of large intestine: Secondary | ICD-10-CM | POA: Diagnosis not present

## 2016-11-07 DIAGNOSIS — C799 Secondary malignant neoplasm of unspecified site: Secondary | ICD-10-CM

## 2016-11-07 DIAGNOSIS — E039 Hypothyroidism, unspecified: Secondary | ICD-10-CM | POA: Diagnosis not present

## 2016-11-07 DIAGNOSIS — C3411 Malignant neoplasm of upper lobe, right bronchus or lung: Secondary | ICD-10-CM | POA: Diagnosis not present

## 2016-11-07 MED ORDER — SODIUM CHLORIDE 0.9 % IV BOLUS (SEPSIS)
500.0000 mL | Freq: Once | INTRAVENOUS | Status: AC
  Administered 2016-11-07: 500 mL via INTRAVENOUS

## 2016-11-07 MED ORDER — ALBUTEROL SULFATE (2.5 MG/3ML) 0.083% IN NEBU: 5.0000 mg | INHALATION_SOLUTION | Freq: Once | RESPIRATORY_TRACT | Status: DC

## 2016-11-07 NOTE — Progress Notes (Signed)
[facility] Liaison:  RN visit.  Visited with patient, wife, daughters and other family in the Emergency Department.  Patient was sleeping.  Patient in NAD at this time.  Per daughter, patient had gotten very SOB at home and insisted on going to ED.  They did call hospice prior to calling 911.    Family educated on making sure they give EMS the DNR form.  They did have the form with [facility] book with them on arrival.  Family voiced some concern that wife may not be able to continue care in the home as patient condition deteriorates.  I spoke with them briefly about [facility], at their request.  They advised that they want to take patient home at this time, but that they may want to transition him to [facility] soon as decline presents itself.    Offered emotional support for family.  I contacted Selmer Dominion, [facility] RN for patient, and updated her on patient condition and plan.  Very pleasant and reasonable family support for patient.  I spoke with Dr. Thomasene Lot also, who advised patient is ready to go home and that she would get patient to go home via [facility].    Please feel free to contact me with any questions or concerns that are hospice related.  Thank you,  Edyth Gunnels, RN, BSN [facility] Liaison [phone]  All hospital liaisons are now on [place].

## 2016-11-07 NOTE — ED Notes (Signed)
Wound care provided to sacral wound.  Dressing applied

## 2016-11-07 NOTE — ED Notes (Signed)
Bed: WA10 Expected date:  Expected time:  Means of arrival:  Comments: EMS-SOB 

## 2016-11-07 NOTE — ED Provider Notes (Signed)
[facility] DEPT Provider Note   CSN: [id number] Arrival date & time: 11/07/16  7510     History   Chief Complaint Chief Complaint  Patient presents with  . Shortness of Breath    HPI Alan [name] is a 76 y.o. male.  HPI  Patient is a 76 year old male with end-stage lung cancer on hospice. Presenting her shortness of breath. Patient has known metastatic lung cancer. Patient was at home and was feeling increasingly shortness of breath and told his wife to "call 911". Patient's wife called hospice but then end up needing to call 911 because she felt like he was getting worse.  On arrival patient was 60% on room air. Placed on nonrebreather and brought here to the emergency department.  Past Medical History:  Diagnosis Date  . 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 Anxiety state, unspecified   . Benign hypertrophy of prostate   . Benign neoplasm of colon    colon polyps  . Cancer [facility])    prostate cancer  . Chronic fatigue 12/23/2015  . Constipation, chronic   . Cough 06/27/2016  . Dehydration 02/22/2016  . Diverticulosis of colon (without mention of hemorrhage)   . Dysuria 03/07/2016  . Encounter for antineoplastic chemotherapy 09/29/2015  . Encounter for antineoplastic immunotherapy 12/23/2015  . Erectile dysfunction   . GERD (gastroesophageal reflux disease)   . Glaucoma   . Hypercholesteremia   . Hypothyroidism   . Irritable bowel syndrome   . Left hip pain 10/27/2015  . Malignant pleural effusion    right  . Onychomycosis   . Radiation 06/20/15-08/02/15   right upper lung region 60 gray  . Restless legs syndrome (RLS)   . Shortness of breath at rest 06/27/2016  . Sleep apnea    does not use cpap  . Unspecified essential hypertension     Patient Active Problem List   Diagnosis Date Noted  . Atrial fibrillation ([place]) 08/27/2016  . Cough 06/27/2016  . Shortness of breath at rest 06/27/2016  . Antineoplastic chemotherapy induced anemia  06/27/2016  . Dehydration 02/22/2016  . Pleural effusion 02/13/2016  . 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 Chronic fatigue 12/23/2015  . Encounter for antineoplastic immunotherapy 12/23/2015  . Left hip pain 10/27/2015  . Encounter for antineoplastic chemotherapy 09/29/2015  . Malignant neoplasm of right upper lobe of lung ([place]) 06/03/2015  . Constipation 04/21/2015  . 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 Gynecomastia, male 10/20/2014  . Decreased hearing 10/20/2014  . Prostate cancer ([place]) 10/20/2014  . CHEST WALL PAIN, ANTERIOR 05/29/2010  . ONYCHOMYCOSIS 10/15/2008  . Anxiety state 10/15/2008  . 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 OBSTRUCTIVE SLEEP APNEA 09/21/2007  . RESTLESS LEG SYNDROME 09/21/2007  . Diverticulosis of large intestine 09/21/2007  . HYPERCHOLESTEROLEMIA 09/11/2007  . ERECTILE DYSFUNCTION 09/11/2007  . Essential hypertension 09/11/2007  . GERD 09/11/2007  . IRRITABLE BOWEL SYNDROME 09/11/2007  . BPH (benign prostatic hypertrophy) with urinary obstruction 09/11/2007  . COLONIC POLYPS 10/30/2005    Past Surgical History:  Procedure Laterality Date  . APPENDECTOMY  12/2012  . CARDIAC CATHETERIZATION  ? 2008  . CHEST TUBE INSERTION Right 03/01/2016   Procedure: INSERTION PLEURAL DRAINAGE CATHETER with doxycycline pleurodesis;  Surgeon: Gaye Pollack, MD;  Location: [facility] OR;  Service: Thoracic;  Laterality: Right;  . COLONOSCOPY    . LAPAROSCOPIC APPENDECTOMY N/A 01/27/2013   Procedure: APPENDECTOMY LAPAROSCOPIC;  Surgeon: Edward Jolly, MD;  Location: [facility] ORS;  Service: General;  Laterality: N/A;  . MEDIASTINOSCOPY N/A  05/31/2015   Procedure: MEDIASTINOSCOPY;  Surgeon: Gaye Pollack, MD;  Location: [facility] OR;  Service: Thoracic;  Laterality: N/A;  . none    . REMOVAL OF PLEURAL DRAINAGE CATHETER Right 04/04/2016   Procedure: REMOVAL OF PLEURAL DRAINAGE CATHETER;  Surgeon: Gaye Pollack, MD;  Location: [facility];  Service: Thoracic;  Laterality: Right;  . THYROIDECTOMY     . TONSILLECTOMY    . VIDEO BRONCHOSCOPY N/A 05/31/2015   Procedure: VIDEO BRONCHOSCOPY;  Surgeon: Gaye Pollack, MD;  Location: [facility] OR;  Service: Thoracic;  Laterality: N/A;       Home Medications    Prior to Admission medications   Medication Sig Start Date End Date Taking? Authorizing Provider  acetaminophen (TYLENOL) 500 MG tablet Take 1,000 mg by mouth every 4 (four) hours as needed for mild pain, moderate pain, fever or headache.     [provider]  albuterol (PROVENTIL HFA;VENTOLIN HFA) 108 (90 Base) MCG/ACT inhaler Inhale 2 puffs into the lungs every 6 (six) hours as needed for wheezing or shortness of breath. 08/27/16   Curcio, Roselie Awkward, NP  albuterol (PROVENTIL) (2.5 MG/3ML) 0.083% nebulizer solution Take 3 mLs (2.5 mg total) by nebulization every 6 (six) hours as needed for wheezing or shortness of breath. 09/06/16   Curt Bears, MD  apixaban (ELIQUIS) 5 MG TABS tablet Take 1 tablet (5 mg total) by mouth 2 (two) times daily. 0/35/00   Delora Fuel, MD  aspirin EC 81 MG tablet Take 81 mg by mouth daily.     [provider]  brimonidine-timolol (COMBIGAN) 0.2-0.5 % ophthalmic solution Place 1 drop into both eyes every 12 (twelve) hours.    [provider]  chlorpheniramine-HYDROcodone (TUSSIONEX) 10-8 MG/5ML SUER Take 5 mLs by mouth every 12 (twelve) hours as needed for cough. 08/07/16   Curt Bears, MD  chlorproMAZINE (THORAZINE) 25 MG tablet Take 25 mg by mouth 3 (three) times daily as needed (for hiccups).    [provider]  dronabinol (MARINOL) 2.5 MG capsule Take 1 capsule (2.5 mg total) by mouth 2 (two) times daily before a meal. for appetite 03/23/16   Curt Bears, MD  finasteride ([name]) 5 MG tablet Take 1 tablet (5 mg total) by mouth daily. 05/23/16   [name], MD  fluticasone (FLONASE) 50 MCG/ACT nasal spray Place 2 sprays into both nostrils daily as needed for allergies or rhinitis. 11/17/15   [name], MD    lactulose (CHRONULAC) 10 GM/15ML solution Take 10 g by mouth 2 (two) times daily as needed for mild constipation or moderate constipation.     [provider]  levothyroxine (SYNTHROID, LEVOTHROID) 125 MCG tablet TAKE 1 TABLET BY MOUTH ONCE DAILY 11/05/16   Philemon Kingdom, MD  LINZESS 290 MCG CAPS capsule TAKE ONE CAPSULE BY MOUTH ONCE DAILY 03/01/16   Mauri Pole, MD  LORazepam (ATIVAN) 0.5 MG tablet TK 1 T PO Q 4 H PRN 09/06/16   [provider]  metoprolol succinate (TOPROL-XL) 25 MG 24 hr tablet Take 1 tablet (25 mg total) by mouth daily. 9/38/18   Delora Fuel, MD  mirtazapine (REMERON) 30 MG tablet Take 1 tablet (30 mg total) by mouth at bedtime. 04/02/16   Curt Bears, MD  omeprazole (PRILOSEC) 20 MG capsule Take 1 capsule (20 mg total) by mouth every morning. 04/06/16   [name], MD  oxyCODONE-acetaminophen (PERCOCET/ROXICET) 5-325 MG tablet Take 1 tablet by mouth every 4 (four) hours as needed for severe pain.  07/03/16   Curt Bears, MD  potassium chloride SA (K-DUR,KLOR-CON) 20 MEQ tablet Take 1 tablet (20 mEq total) by mouth 2 (two) times daily. Patient not taking: Reported on 08/30/2016 06/27/16   Curt Bears, MD  predniSONE (DELTASONE) 10 MG tablet Take 1 tablet (10 mg total) by mouth daily. 08/07/16   Curt Bears, MD  prochlorperazine (COMPAZINE) 10 MG tablet TAKE 1 TABLET BY MOUTH EVERY 6 HOURS AS NEEDED FOR NAUSEA AND VOMITING Patient taking differently: Take 10 mg by mouth every 6 (six) hours as needed for nausea or vomiting.  07/03/16   Curt Bears, MD  senna-docusate (SENOKOT-S) 8.6-50 MG tablet Take 2 tablets by mouth 2 (two) times daily. 08/07/16   Curt Bears, MD  sucralfate (CARAFATE) 1 g tablet Take 1 tablet (1 g total) by mouth 4 (four) times daily -  with meals and at bedtime. Mix in 2Tbs water, swish and swallow 08/01/15   Hayden Pedro, PA-C  tamsulosin (FLOMAX) 0.4 MG CAPS capsule Take 2 capsules (0.8 mg total) by  mouth at bedtime. 11/17/15   [name], MD    Family History Family History  Problem Relation Age of Onset  . Emphysema Mother   . Cancer Father     prostate  . Colon cancer Paternal Grandfather 10  . Cancer Paternal Grandfather     prostate  . Cancer Brother     prostate    Social History Social History  Substance Use Topics  . Smoking status: Former Smoker    Packs/day: 2.00    Years: 15.00    Types: Cigarettes    Quit date: 07/02/1968  . Smokeless tobacco: Former Systems developer    Types: Chew, Snuff  . Alcohol use No     Allergies   Codeine   Review of Systems Review of Systems  Constitutional: Positive for activity change.  Respiratory: Positive for shortness of breath.   Cardiovascular: Negative for chest pain.  Psychiatric/Behavioral: Positive for agitation.     Physical Exam Updated Vital Signs BP 111/84 (BP Location: Left Arm)   Pulse (!) 114   Resp (!) 28   SpO2 96%   Physical Exam  Constitutional: He appears well-nourished.  Cachectic elderly male  HENT:  Head: Normocephalic.  Eyes: Conjunctivae are normal.  Cardiovascular:  tachycardia  Pulmonary/Chest:  Tachypnea. Left lung clear. Right lung no breath sounds.  Neurological:  Rest with eyes closed.  Skin: Skin is warm and dry. He is not diaphoretic.  Psychiatric: He has a normal mood and affect. His behavior is normal.     ED Treatments / Results  Labs (all labs ordered are listed, but only abnormal results are displayed) Labs Reviewed - No data to display  EKG  EKG Interpretation None       Radiology No results found.  Procedures Procedures (including critical care time)  Medications Ordered in ED Medications  sodium chloride 0.9 % bolus 500 mL (not administered)     Initial Impression / Assessment and Plan / ED Course  I have reviewed the triage vital signs and the nursing notes.  Pertinent labs & imaging results that were available during my care of the patient were  reviewed by me and considered in my medical decision making (see chart for details).     Patient is a 76 year old male on hospice with metastatic lung cancer. Patient's daughter and wife are at bedside. Patient's daughter declined any lab work or x-ray. We will instead try to decrease oxygen down to patient's  home oxygen and given a small bolus of fluids to help symptoms.  Will discharge home per family's wishes  Final Clinical Impressions(s) / ED Diagnoses   Final diagnoses:  None    New Prescriptions New Prescriptions   No medications on file     Macarthur Critchley, MD 11/08/16 [phone]

## 2016-11-07 NOTE — ED Triage Notes (Signed)
Per EMS, pt from home.  Pt c/o shortness of breath.  Pt is a lung cancer pt, stage 4 on hospice.  All tx stopped.  SOB since last night.  Getting worse.  Home oxygen.  Baseline sats at 60% on his home oxygen.  Unknown setting.  NRB placed increased to 97%.  20g LAC.  Vitals: NSR, 111/77, hr 116, resp 16, 97% NRB - 15 L, a/o x 1 - baseline. Pt does not speak much per EMS and is baseline.

## 2016-11-07 NOTE — ED Notes (Signed)
Notified [facility] for transport

## 2016-11-07 NOTE — ED Notes (Signed)
Pt removed from NRB with decreasing oxygen over time. Pt now at 3.5 L [place] with sats 92%.  No obvious distress noted.  Pt resting.  Family educated to dressing changes to sacrum and position changes to improve site.

## 2016-11-14 ENCOUNTER — Telehealth: Payer: Self-pay | Admitting: Internal Medicine

## 2016-11-14 NOTE — Telephone Encounter (Signed)
[name] called from Hospice to let us know that Alan [name] passed away on 11-18-16 @ 1:32pm @ home

## 2016-11-30 DEATH — deceased

## 2017-01-05 ENCOUNTER — Other Ambulatory Visit: Payer: Self-pay | Admitting: Nurse Practitioner

## 2017-06-20 NOTE — Telephone Encounter (Signed)
TC to client, just to check in with him.Having some issues with swallowing and sometimes has appetite and sometimes does not.Encouraged to eat small meals and use ensure to supplement.    By Vinnie Langton, RN

## 2017-06-20 NOTE — Congregational Nurse Program (Signed)
Congregational Nurse Program Note  Date of Encounter: 02/18/2016  Past Medical History: Past Medical History:  Diagnosis Date  . 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 Anxiety state, unspecified   . Benign hypertrophy of prostate   . Benign neoplasm of colon    colon polyps  . Cancer [facility])    prostate cancer  . Chronic fatigue 12/23/2015  . Constipation, chronic   . Cough 06/27/2016  . Dehydration 02/22/2016  . Diverticulosis of colon (without mention of hemorrhage)   . Dysuria 03/07/2016  . Encounter for antineoplastic chemotherapy 09/29/2015  . Encounter for antineoplastic immunotherapy 12/23/2015  . Erectile dysfunction   . GERD (gastroesophageal reflux disease)   . Glaucoma   . Hypercholesteremia   . Hypothyroidism   . Irritable bowel syndrome   . Left hip pain 10/27/2015  . Malignant pleural effusion    right  . Onychomycosis   . Radiation 06/20/15-08/02/15   right upper lung region 60 gray  . Restless legs syndrome (RLS)   . Shortness of breath at rest 06/27/2016  . Sleep apnea    does not use cpap  . Unspecified essential hypertension     Encounter Details: client seen today, at church function.  Is still having trouble with catching breath.  Referred to MD.  Vinnie Langton, RN

## 2017-06-20 NOTE — Congregational Nurse Program (Signed)
He agreed and is being taken to ER Had difficulty urinating, client finally agreed to be checked.  They should let me know if I can do anything for them.  Will followup.-------------------

## 2017-06-20 NOTE — Congregational Nurse Program (Signed)
Congregational Nurse Program Note  Date of Encounter: 02/26/2016  Past Medical History: Past Medical History:  Diagnosis Date  . 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 Anxiety state, unspecified   . Benign hypertrophy of prostate   . Benign neoplasm of colon    colon polyps  . Cancer [facility])    prostate cancer  . Chronic fatigue 12/23/2015  . Constipation, chronic   . Cough 06/27/2016  . Dehydration 02/22/2016  . Diverticulosis of colon (without mention of hemorrhage)   . Dysuria 03/07/2016  . Encounter for antineoplastic chemotherapy 09/29/2015  . Encounter for antineoplastic immunotherapy 12/23/2015  . Erectile dysfunction   . GERD (gastroesophageal reflux disease)   . Glaucoma   . Hypercholesteremia   . Hypothyroidism   . Irritable bowel syndrome   . Left hip pain 10/27/2015  . Malignant pleural effusion    right  . Onychomycosis   . Radiation 06/20/15-08/02/15   right upper lung region 60 gray  . Restless legs syndrome (RLS)   . Shortness of breath at rest 06/27/2016  . Sleep apnea    does not use cpap  . Unspecified essential hypertension     Encounter Details: HV Client having trouble with shortness of breath,.  Family present.  Client still concerned about others.  Would like shower chair.  Delivered same.Client firm in faith and states he has no fear, allowed to ventilate.

## 2017-06-20 NOTE — Congregational Nurse Program (Signed)
Client seen in ER with family.  Having a lot of difficulty breathing.  Getting treatment in ER.  Family gathered.  Offered family time to ventilate.  Client very uncomfortable and anxious.

## 2017-09-13 ENCOUNTER — Ambulatory Visit: Payer: Medicare Other | Admitting: Internal Medicine

## 2017-11-22 IMAGING — US US THORACENTESIS ASP PLEURAL SPACE W/IMG GUIDE
1 series · 6 of 6 positions shown · non-contrast
Comparison: none

INDICATION: Patient with history of metastatic lung cancer, right greater than
left pleural effusions. Request made for diagnostic and therapeutic
right thoracentesis.

[Series 1: us thoracentesis asp pleural space w/img guide · 0.25mm/px · 6 of 6 slices shown]
[im 1/6]
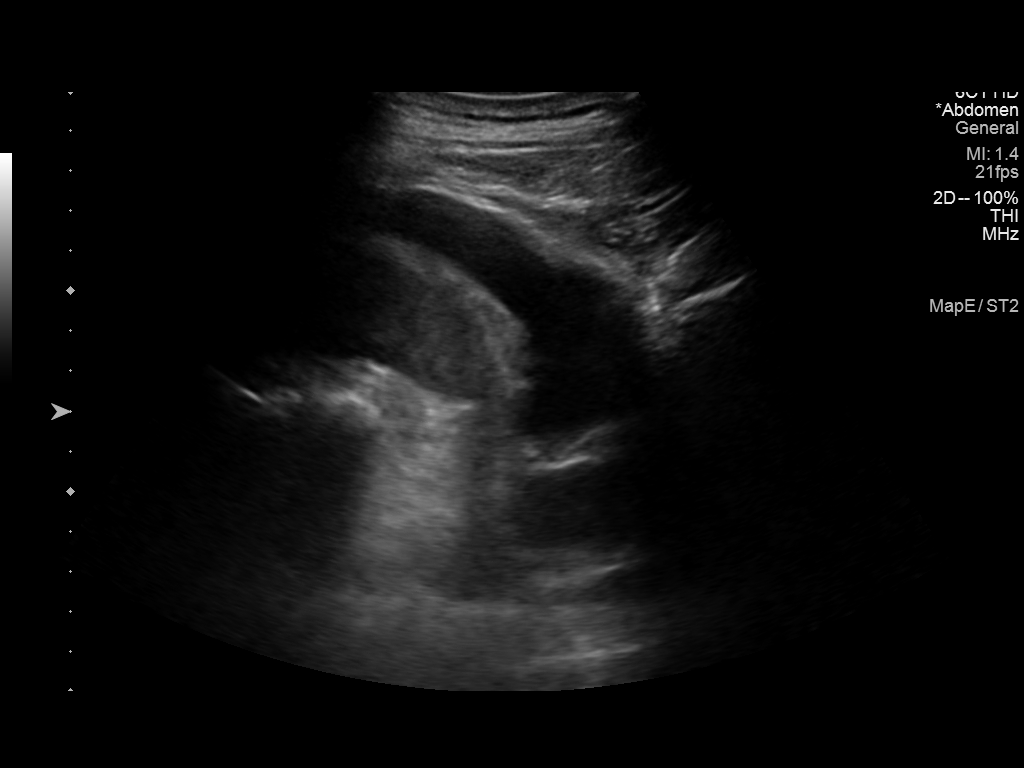
[im 2/6]
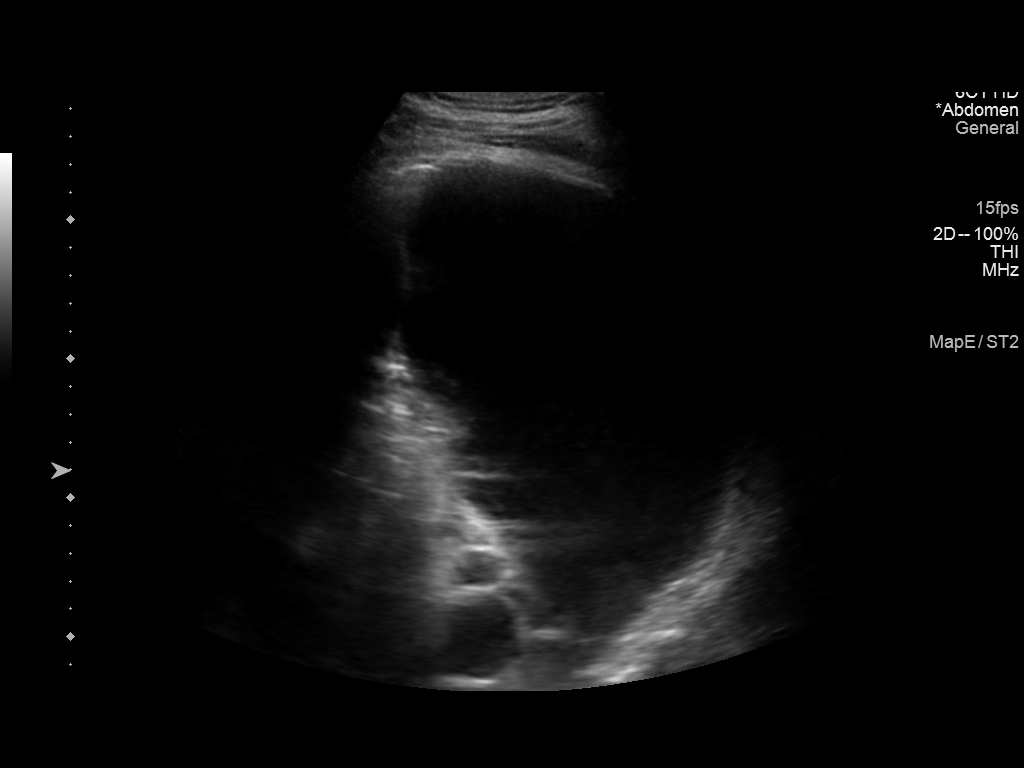
[im 3/6]
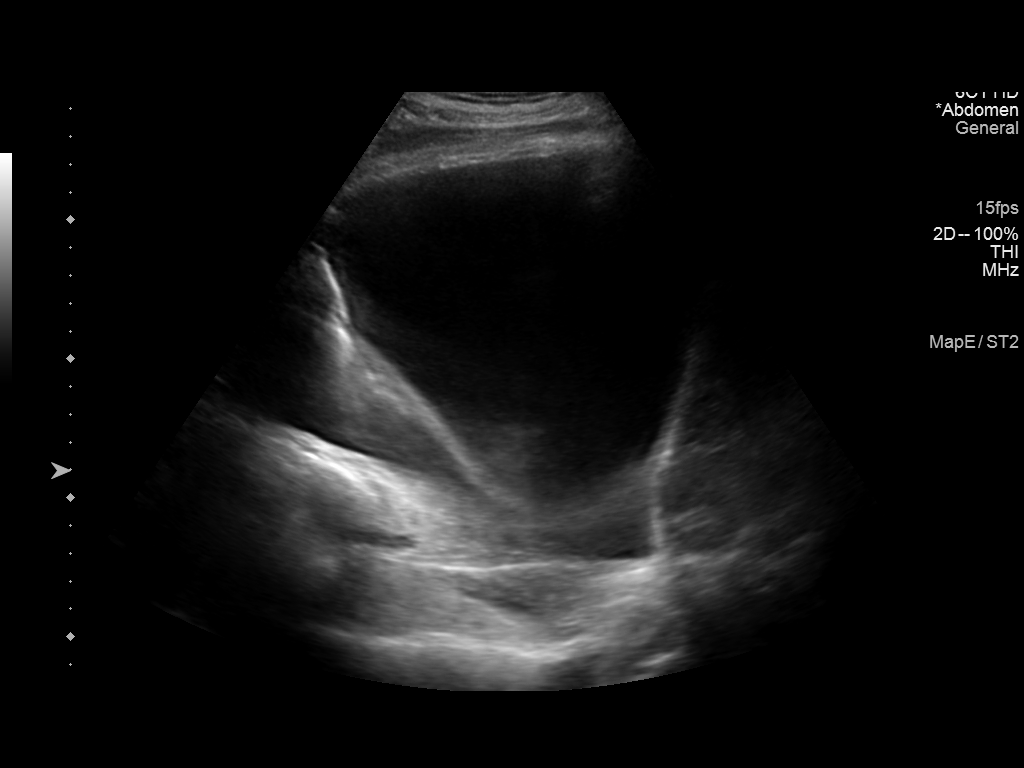
[im 4/6]
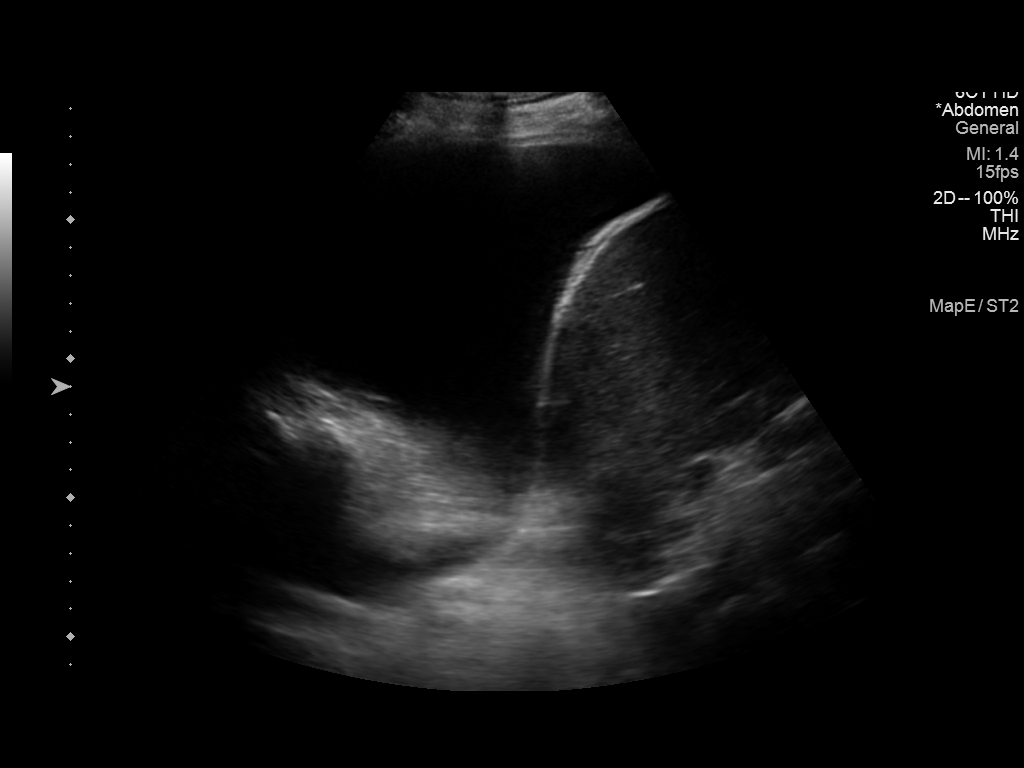
[im 5/6]
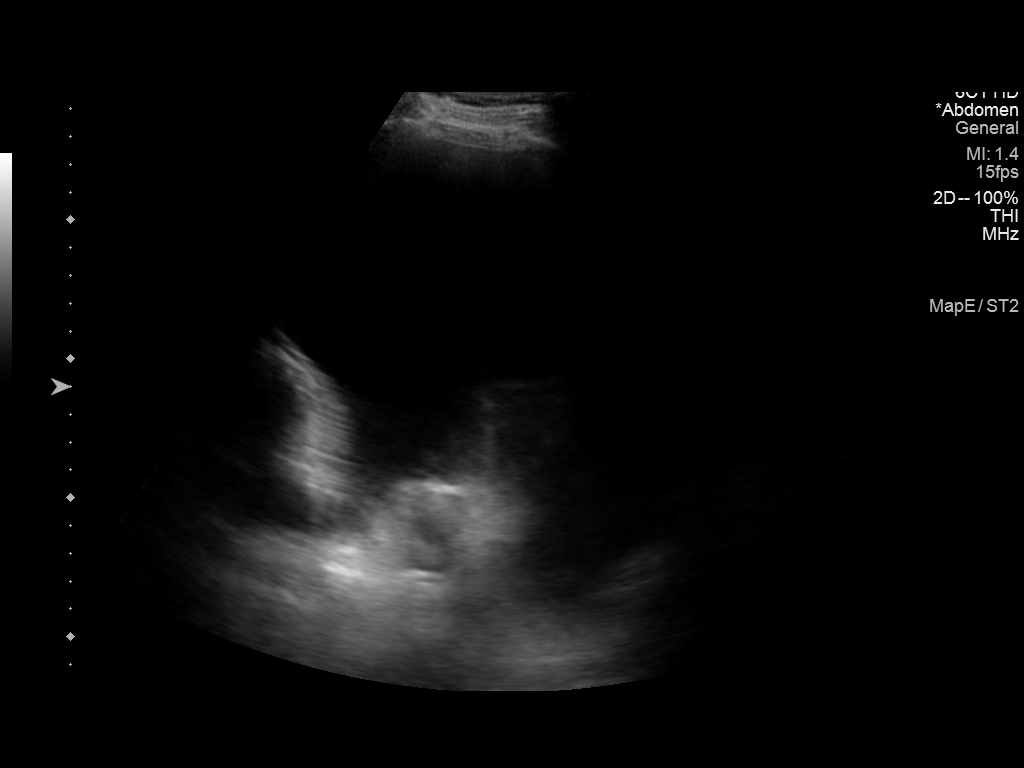
[im 6/6]
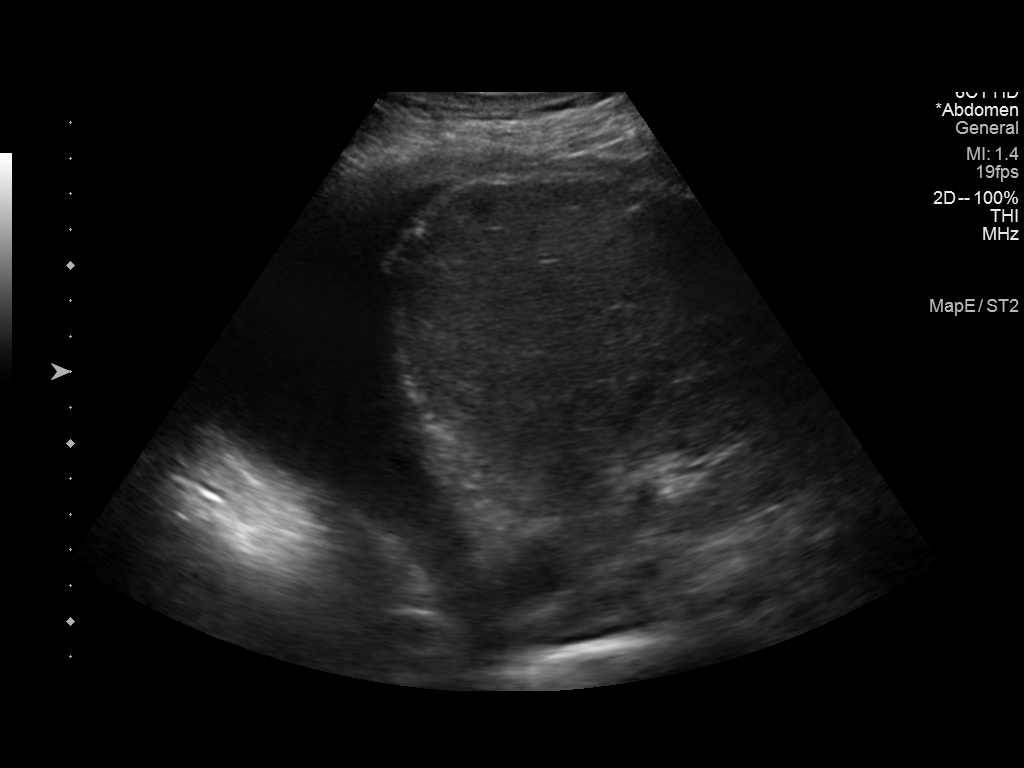

[6 of 6 positions shown; findings below may reference images not displayed]

EXAM:
ULTRASOUND GUIDED DIAGNOSTIC AND THERAPEUTIC RIGHT THORACENTESIS

MEDICATIONS:
None.

COMPLICATIONS:
None immediate.

PROCEDURE:
An ultrasound guided thoracentesis was thoroughly discussed with the
patient and questions answered. The benefits, risks, alternatives
and complications were also discussed. The patient understands and
wishes to proceed with the procedure. Written consent was obtained.

Ultrasound was performed to localize and mark an adequate pocket of
fluid in the right chest. The area was then prepped and draped in
the normal sterile fashion. 1% Lidocaine was used for local
anesthesia. Under ultrasound guidance a Safe-T-Centesis catheter was
introduced. Thoracentesis was performed. The catheter was removed
and a dressing applied.
FINDINGS: A total of approximately 1.7 liters of slightly hazy, yellow fluid
was removed. Samples were sent to the laboratory as requested by the
clinical team. Due to this being patient's initial thoracentesis
only the above amount of fluid was removed at this time.
IMPRESSION: Successful ultrasound guided diagnostic and therapeutic right
thoracentesis yielding 1.7 liters of pleural fluid.

## 2017-11-22 IMAGING — CR DG CHEST 2V
2 series · 2 of 2 positions shown · non-contrast
Comparison: PET-CT 12/19/2015, chest CT 12/01/2015, prior chest
x-ray 05/31/2015

CLINICAL DATA: 75-year-old male with a history of increasing cough
and shortness of breath over 3 days. History of lung cancer

EXAM:
CHEST  2 VIEW

[w chest pa]
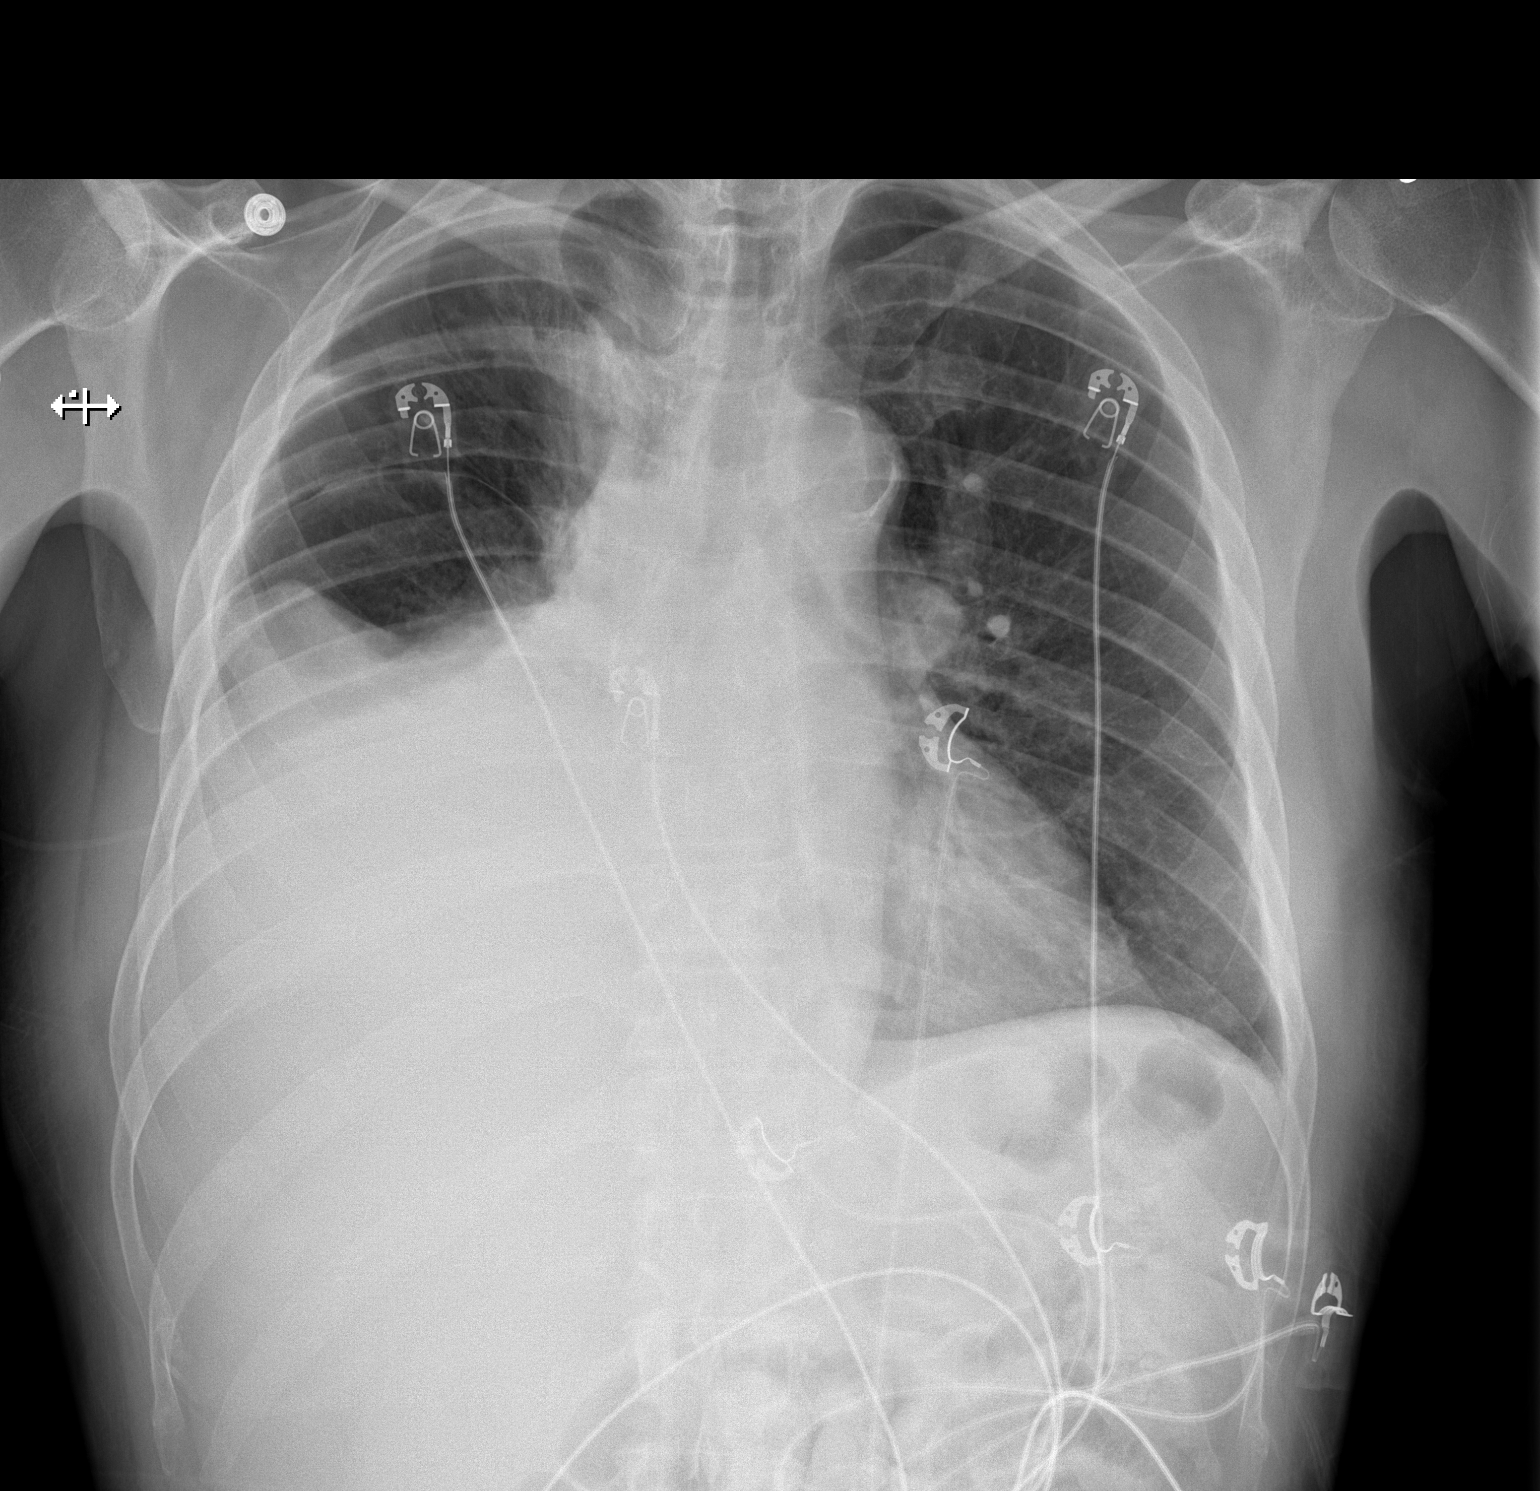

[w chest lat]
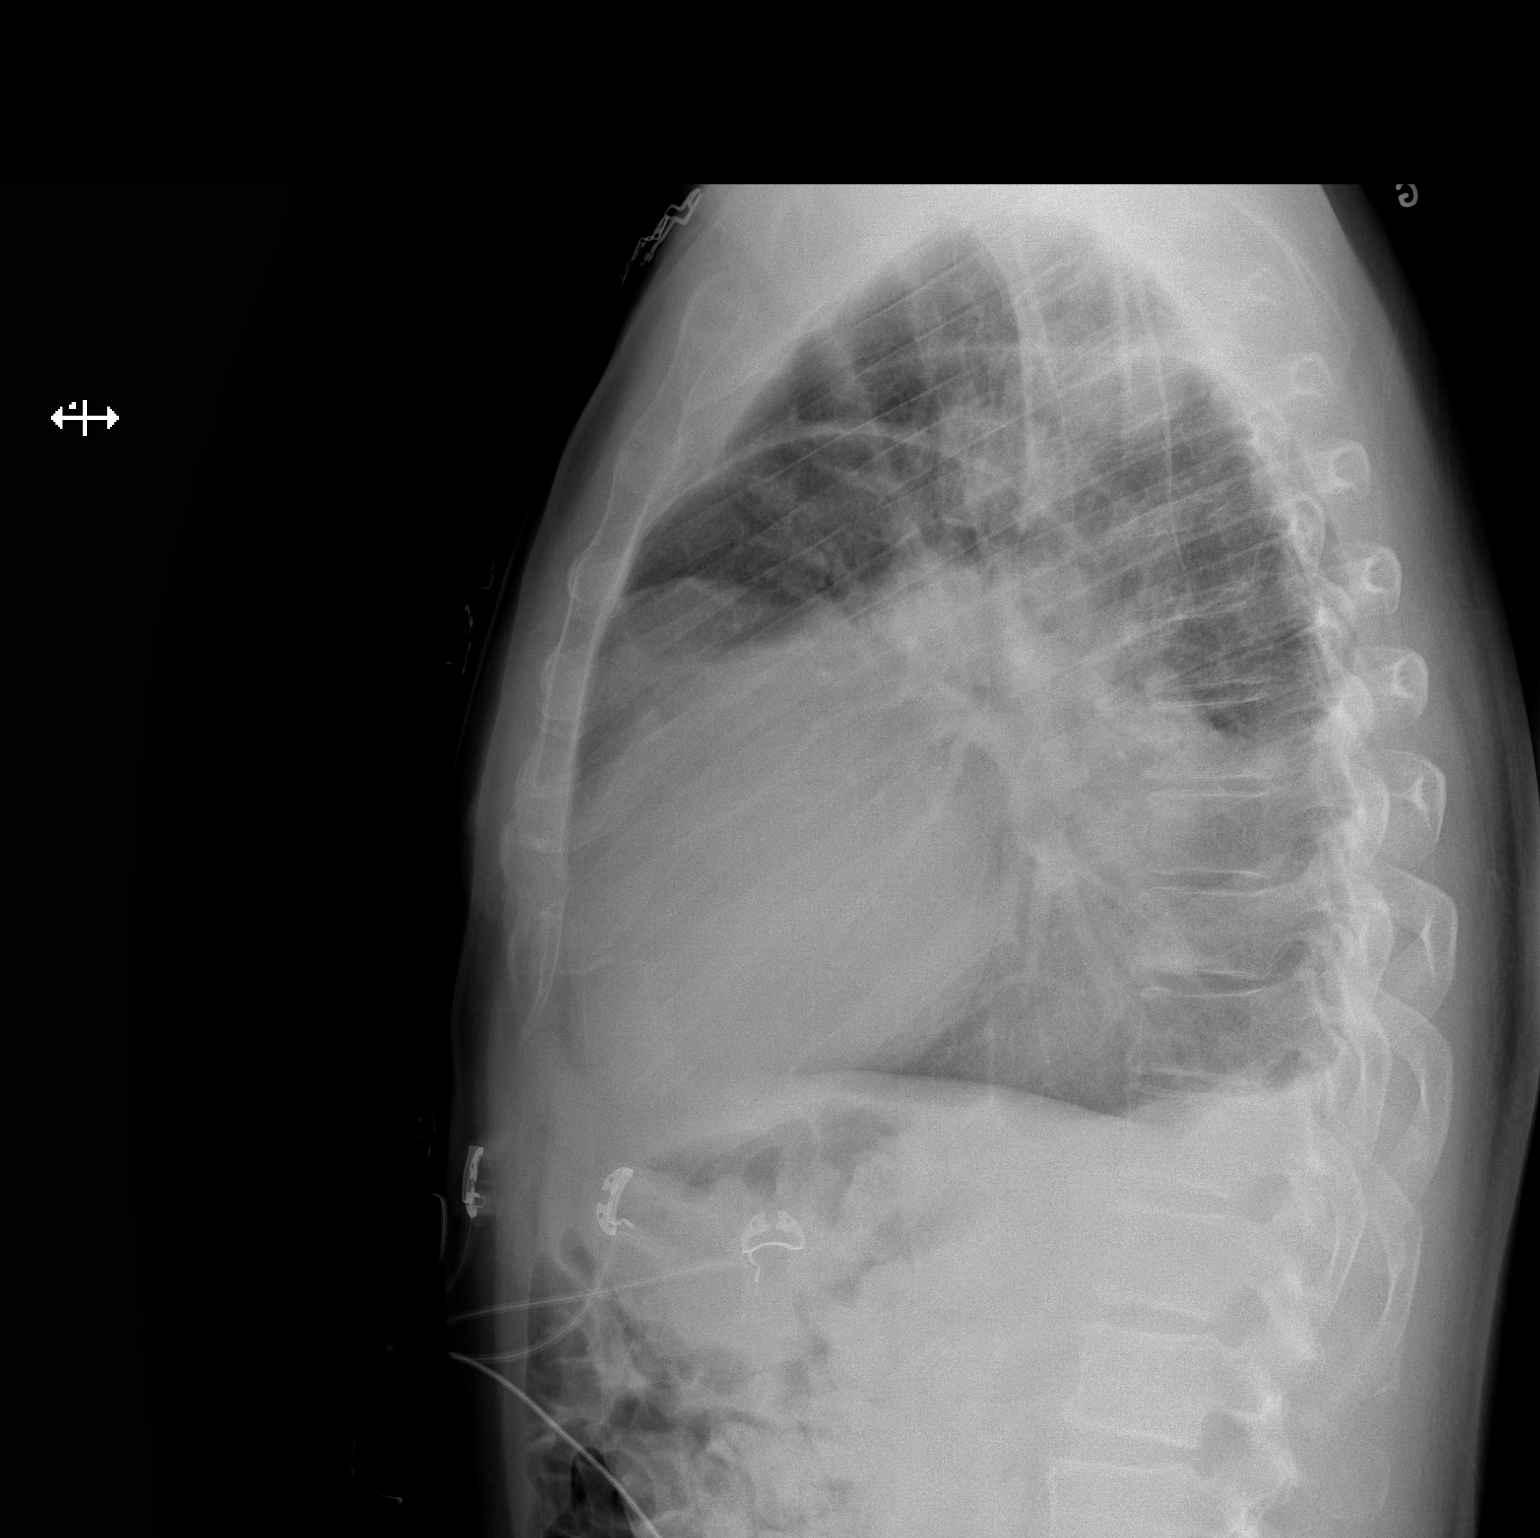

[2 of 2 positions shown; findings below may reference images not displayed]

FINDINGS: Right-sided mediastinal and cardiac border obscured by overlying
lung/ pleural disease.

Opacity in the right suprahilar region involving the mediastinal
border and the right upper lobe. Uplifting of the minor fissure.

Dense opacity at the right base obscures the right hemidiaphragm and
the right heart border.

No pneumothorax.

Dense opacity on the lateral view at the posterior base.

Trace opacity at the posterior left base.
IMPRESSION: Dense opacity at the right base, likely a combination of volume
loss, atelectasis/consolidation, and pleural effusion in this
patient with known carcinoma.

Opacity in the right suprahilar region, potentially a combination of
residual disease and/or treatment effects.

Aortic atherosclerosis.

## 2017-11-22 IMAGING — CR DG CHEST 1V
1 series · 1 of 1 positions shown · non-contrast
Comparison: X-ray same day, CT same day

CLINICAL DATA: 75-year-old male status post thoracentesis

EXAM:
CHEST 1 VIEW

[w chest pa]
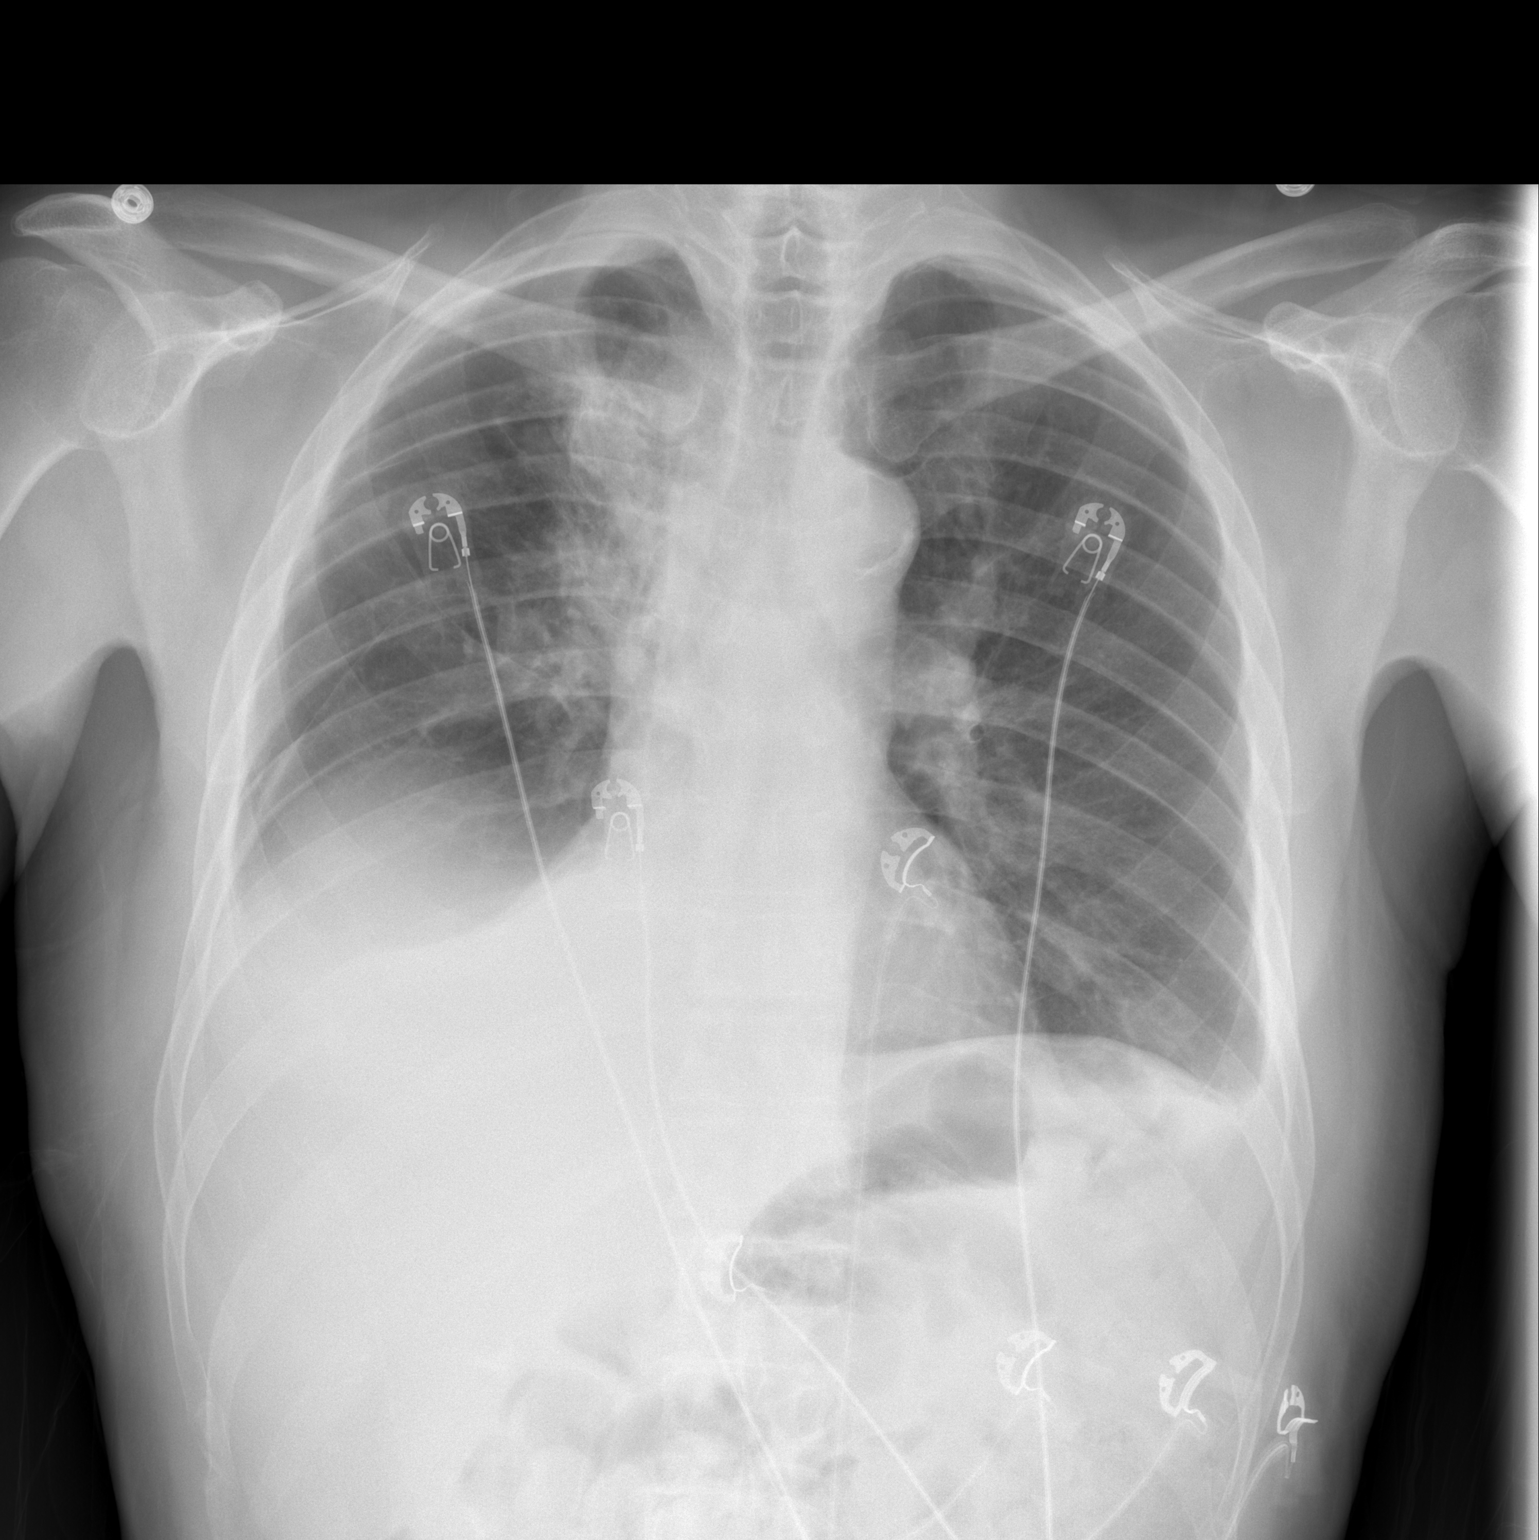

[1 of 1 positions shown; findings below may reference images not displayed]

FINDINGS: Cardiomediastinal silhouette unchanged. Improved visualization of
the right heart border, although the right hemidiaphragm and right
cardiophrenic angle are obscured.

Similar appearance of right suprahilar density.

Improved opacity at the right base status post thoracentesis.
Uplifting of the minor fissure persist.

Calcifications.

No pneumothorax.
IMPRESSION: Status post right thoracentesis without pneumothorax. Improved
right-sided pleural fluid.

## 2017-11-29 IMAGING — US US THORACENTESIS ASP PLEURAL SPACE W/IMG GUIDE
1 series · 6 of 6 positions shown · non-contrast
Comparison: none

INDICATION: History of metastatic lung cancer. Request is made for diagnostic
and therapeutic thoracentesis.

[Series 1: us thoracentesis asp pleural space w/img guide · 0.25mm/px · 6 of 6 slices shown]
[im 1/6]
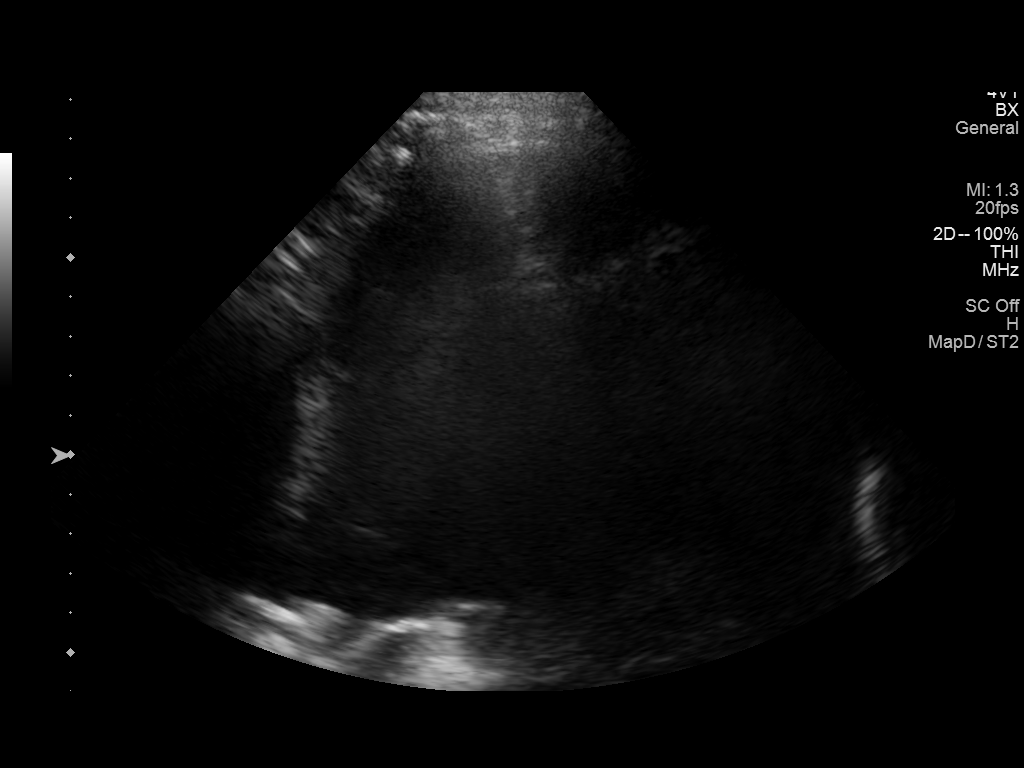
[im 2/6]
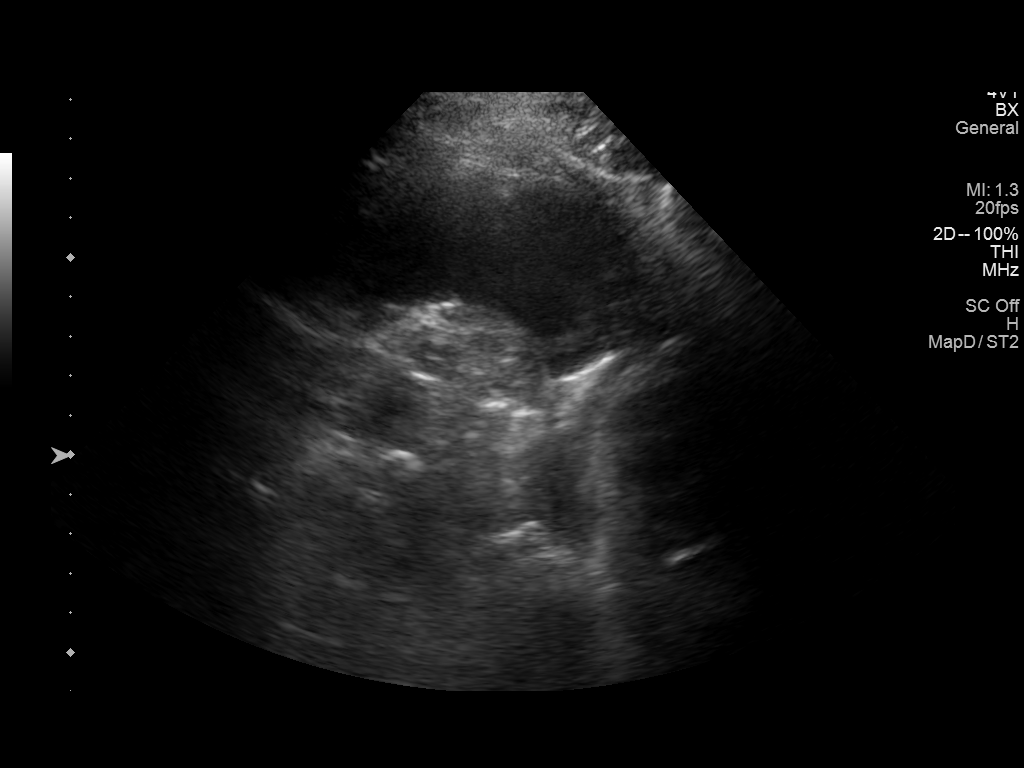
[im 3/6]
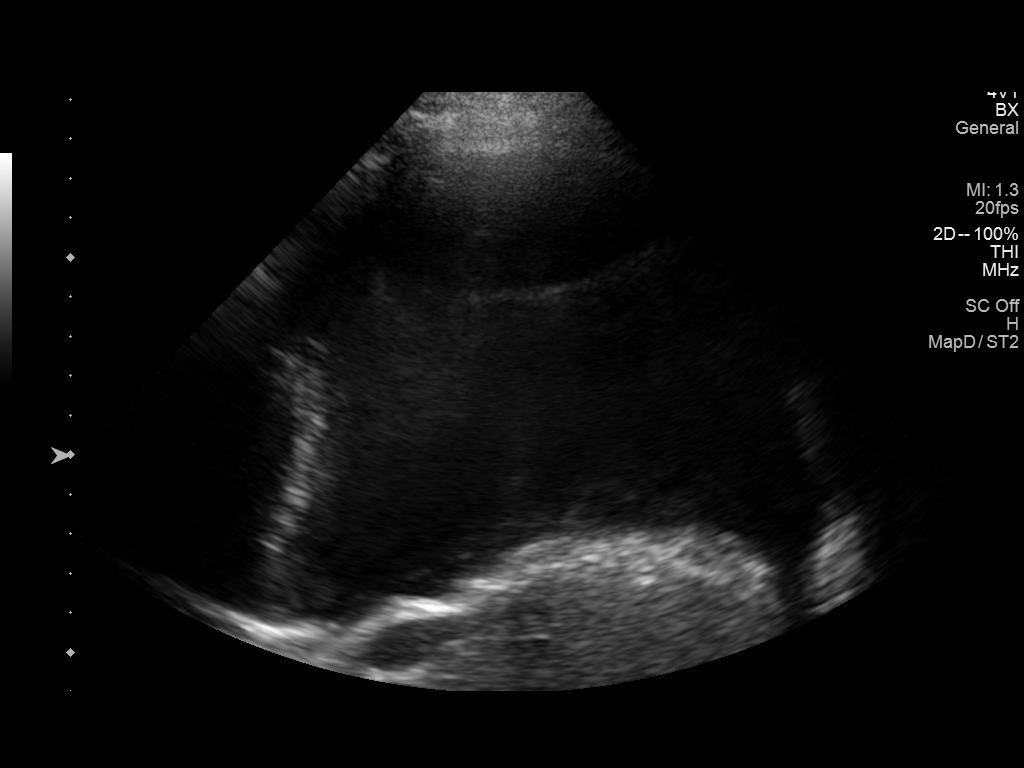
[im 4/6]
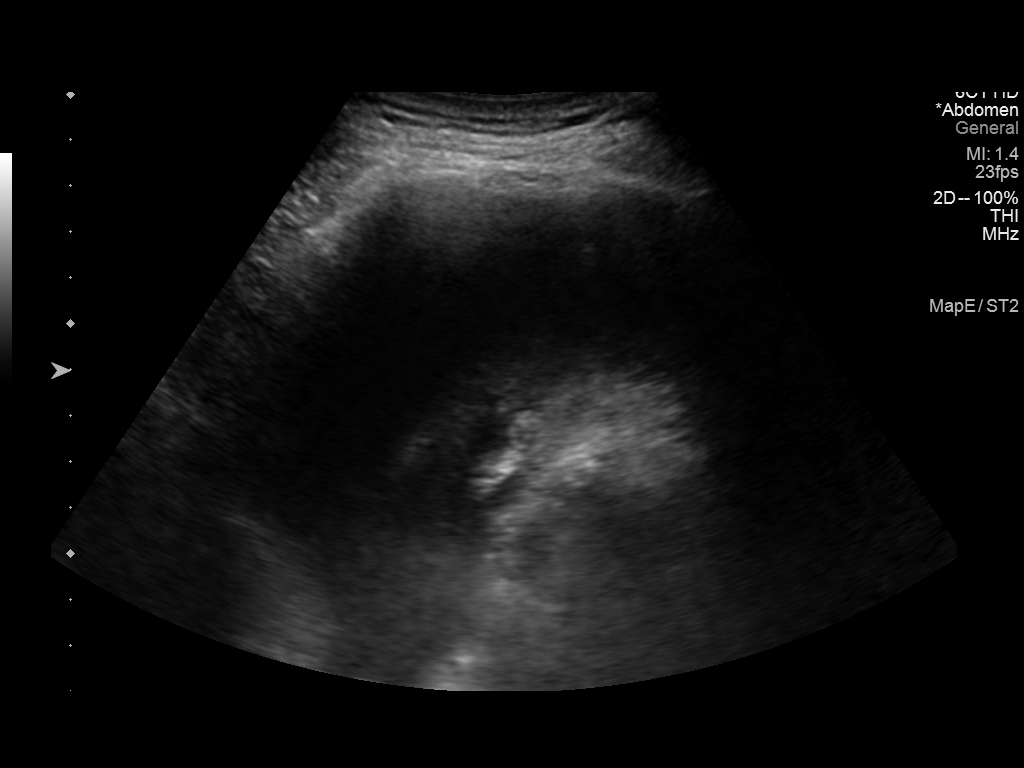
[im 5/6]
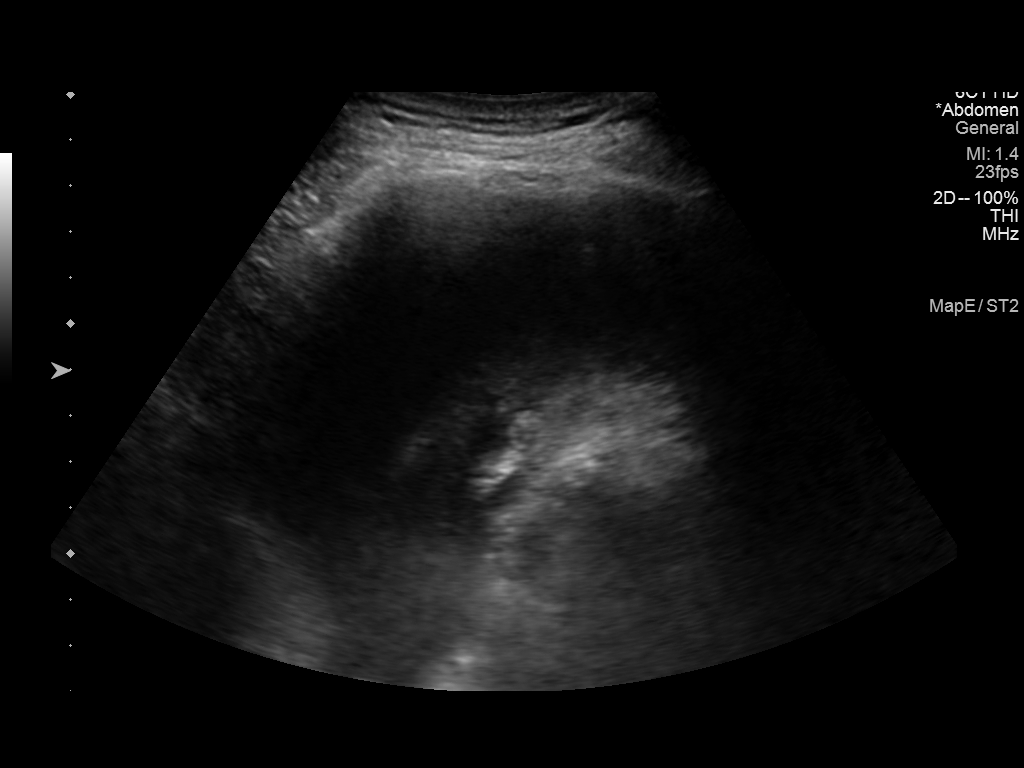
[im 6/6]
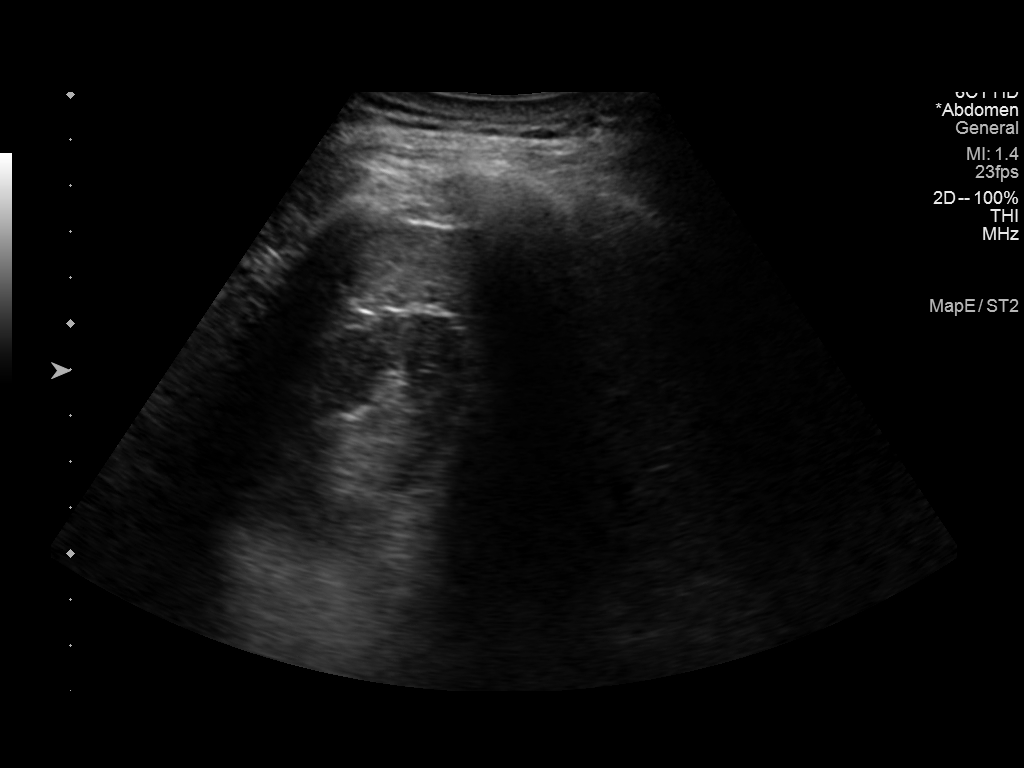

[6 of 6 positions shown; findings below may reference images not displayed]

EXAM:
ULTRASOUND GUIDED DIAGNOSTIC AND THERAPEUTIC THORACENTESIS

MEDICATIONS:
1% lidocaine.

COMPLICATIONS:
None immediate.

PROCEDURE:
An ultrasound guided thoracentesis was thoroughly discussed with the
patient and questions answered. The benefits, risks, alternatives
and complications were also discussed. The patient understands and
wishes to proceed with the procedure. Written consent was obtained.

Ultrasound was performed to localize and mark an adequate pocket of
fluid in the right chest. The area was then prepped and draped in
the normal sterile fashion. 1% Lidocaine was used for local
anesthesia. Under ultrasound guidance a Safe-T-Centesis catheter was
introduced. Thoracentesis was performed. The catheter was removed
and a dressing applied.
FINDINGS: A total of approximately 2.4 L of yellow fluid was removed. Samples
were sent to the laboratory as requested by the clinical team.
IMPRESSION: Successful ultrasound guided right thoracentesis yielding 2.4 L of
pleural fluid.

## 2017-11-29 IMAGING — DX DG CHEST 1V
1 series · 1 of 1 positions shown · non-contrast
Comparison: 02/20/2016

CLINICAL DATA: Status post right thoracentesis.  Cough.

EXAM:
CHEST 1 VIEW

[chest ap]
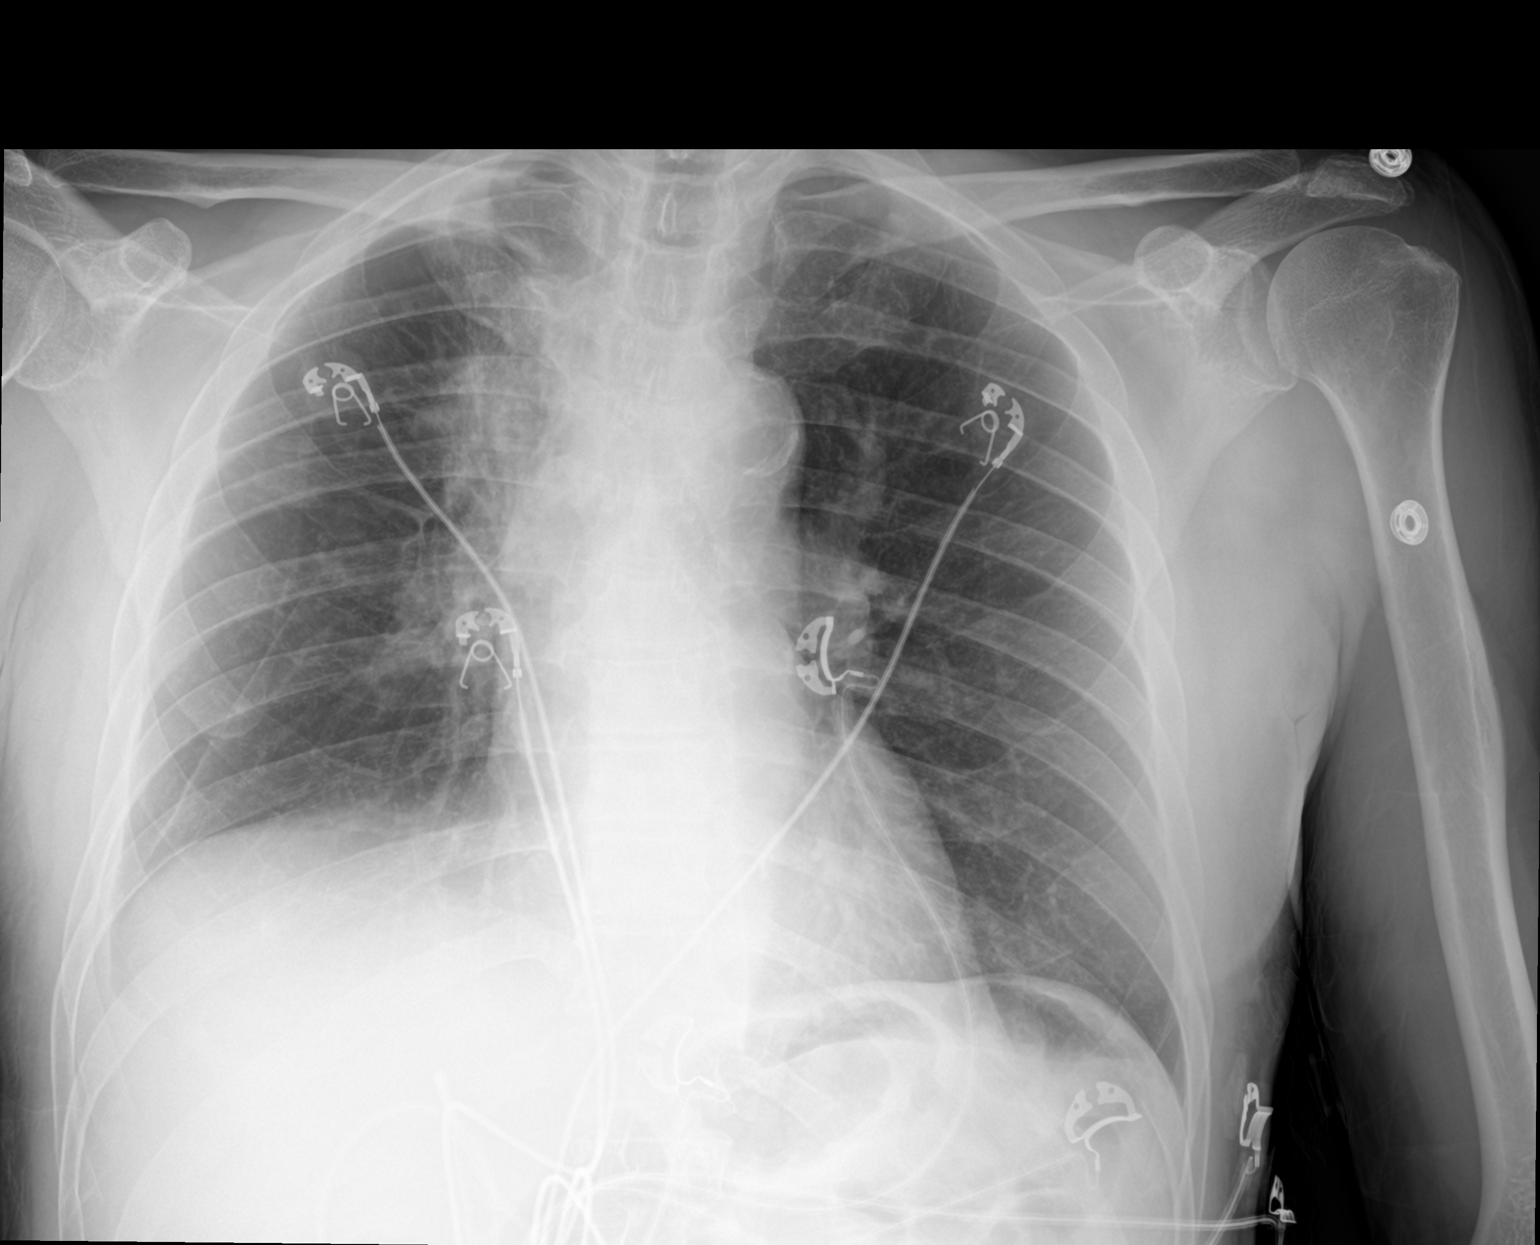

[1 of 1 positions shown; findings below may reference images not displayed]

FINDINGS: Smaller right pleural effusion. No evidence for pneumothorax. Right
suprahilar mass again identified. The left lung is clear. Smaller
left pleural effusion noted. No pulmonary edema.
IMPRESSION: Smaller right pleural effusion.  No pneumothorax.
